# Patient Record
Sex: Female | Born: 1955 | Race: White | Hispanic: No | Marital: Married | Smoking: Former smoker
Health system: Southern US, Community
[De-identification: ages and names within clinical notes are randomized; demographics above are authoritative.]

## PROBLEM LIST (undated history)

## (undated) DIAGNOSIS — M21379 Foot drop, unspecified foot: Secondary | ICD-10-CM

## (undated) DIAGNOSIS — C801 Malignant (primary) neoplasm, unspecified: Secondary | ICD-10-CM

## (undated) DIAGNOSIS — F32A Depression, unspecified: Secondary | ICD-10-CM

## (undated) DIAGNOSIS — C189 Malignant neoplasm of colon, unspecified: Secondary | ICD-10-CM

## (undated) DIAGNOSIS — C787 Secondary malignant neoplasm of liver and intrahepatic bile duct: Secondary | ICD-10-CM

## (undated) DIAGNOSIS — IMO0001 Reserved for inherently not codable concepts without codable children: Secondary | ICD-10-CM

## (undated) DIAGNOSIS — E785 Hyperlipidemia, unspecified: Secondary | ICD-10-CM

## (undated) DIAGNOSIS — Z923 Personal history of irradiation: Secondary | ICD-10-CM

## (undated) DIAGNOSIS — M419 Scoliosis, unspecified: Secondary | ICD-10-CM

## (undated) DIAGNOSIS — I10 Essential (primary) hypertension: Secondary | ICD-10-CM

## (undated) DIAGNOSIS — E039 Hypothyroidism, unspecified: Secondary | ICD-10-CM

## (undated) DIAGNOSIS — IMO0002 Reserved for concepts with insufficient information to code with codable children: Secondary | ICD-10-CM

## (undated) DIAGNOSIS — I499 Cardiac arrhythmia, unspecified: Secondary | ICD-10-CM

## (undated) DIAGNOSIS — F329 Major depressive disorder, single episode, unspecified: Secondary | ICD-10-CM

## (undated) DIAGNOSIS — I341 Nonrheumatic mitral (valve) prolapse: Secondary | ICD-10-CM

## (undated) DIAGNOSIS — Z9221 Personal history of antineoplastic chemotherapy: Secondary | ICD-10-CM

## (undated) HISTORY — DX: Essential (primary) hypertension: I10

## (undated) HISTORY — DX: Malignant neoplasm of colon, unspecified: C18.9

## (undated) HISTORY — PX: CHOLECYSTECTOMY: SHX55

## (undated) HISTORY — DX: Hypothyroidism, unspecified: E03.9

## (undated) HISTORY — PX: BACK SURGERY: SHX140

## (undated) HISTORY — DX: Reserved for inherently not codable concepts without codable children: IMO0001

## (undated) HISTORY — DX: Foot drop, unspecified foot: M21.379

## (undated) HISTORY — DX: Hyperlipidemia, unspecified: E78.5

## (undated) HISTORY — DX: Nonrheumatic mitral (valve) prolapse: I34.1

## (undated) HISTORY — DX: Major depressive disorder, single episode, unspecified: F32.9

## (undated) HISTORY — DX: Scoliosis, unspecified: M41.9

## (undated) HISTORY — DX: Reserved for concepts with insufficient information to code with codable children: IMO0002

## (undated) HISTORY — PX: COLON SURGERY: SHX602

## (undated) HISTORY — DX: Secondary malignant neoplasm of liver and intrahepatic bile duct: C78.7

## (undated) HISTORY — DX: Depression, unspecified: F32.A

## (undated) HISTORY — DX: Cardiac arrhythmia, unspecified: I49.9

## (undated) HISTORY — DX: Personal history of antineoplastic chemotherapy: Z92.21

---
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
## 1999-07-05 ENCOUNTER — Ambulatory Visit (HOSPITAL_COMMUNITY): Admission: RE | Admit: 1999-07-05 | Discharge: 1999-07-05 | Payer: Self-pay | Admitting: Internal Medicine

## 1999-07-05 ENCOUNTER — Encounter: Payer: Self-pay | Admitting: Internal Medicine

## 2000-05-11 ENCOUNTER — Encounter: Payer: Self-pay | Admitting: Gynecology

## 2000-05-11 ENCOUNTER — Encounter: Admission: RE | Admit: 2000-05-11 | Discharge: 2000-05-11 | Payer: Self-pay | Admitting: Gynecology

## 2001-04-25 ENCOUNTER — Encounter: Payer: Self-pay | Admitting: Gynecology

## 2001-04-25 ENCOUNTER — Encounter: Admission: RE | Admit: 2001-04-25 | Discharge: 2001-04-25 | Payer: Self-pay | Admitting: Gynecology

## 2001-06-17 ENCOUNTER — Other Ambulatory Visit: Admission: RE | Admit: 2001-06-17 | Discharge: 2001-06-17 | Payer: Self-pay | Admitting: Gynecology

## 2002-04-04 ENCOUNTER — Encounter: Payer: Self-pay | Admitting: Internal Medicine

## 2002-04-04 ENCOUNTER — Ambulatory Visit (HOSPITAL_COMMUNITY): Admission: RE | Admit: 2002-04-04 | Discharge: 2002-04-04 | Payer: Self-pay | Admitting: Internal Medicine

## 2002-05-21 ENCOUNTER — Encounter: Payer: Self-pay | Admitting: Gynecology

## 2002-05-21 ENCOUNTER — Encounter: Admission: RE | Admit: 2002-05-21 | Discharge: 2002-05-21 | Payer: Self-pay | Admitting: Gynecology

## 2002-08-05 ENCOUNTER — Other Ambulatory Visit: Admission: RE | Admit: 2002-08-05 | Discharge: 2002-08-05 | Payer: Self-pay | Admitting: Gynecology

## 2003-09-09 ENCOUNTER — Other Ambulatory Visit: Admission: RE | Admit: 2003-09-09 | Discharge: 2003-09-09 | Payer: Self-pay | Admitting: Gynecology

## 2003-09-09 ENCOUNTER — Encounter: Admission: RE | Admit: 2003-09-09 | Discharge: 2003-09-09 | Payer: Self-pay | Admitting: Gynecology

## 2003-09-12 HISTORY — PX: KNEE ARTHROSCOPY: SUR90

## 2003-09-22 ENCOUNTER — Emergency Department (HOSPITAL_COMMUNITY): Admission: EM | Admit: 2003-09-22 | Discharge: 2003-09-22 | Payer: Self-pay | Admitting: Emergency Medicine

## 2003-10-05 ENCOUNTER — Encounter: Admission: RE | Admit: 2003-10-05 | Discharge: 2003-10-05 | Payer: Self-pay | Admitting: Specialist

## 2005-02-24 ENCOUNTER — Encounter: Admission: RE | Admit: 2005-02-24 | Discharge: 2005-02-24 | Payer: Self-pay | Admitting: Gynecology

## 2006-07-09 ENCOUNTER — Ambulatory Visit (HOSPITAL_COMMUNITY): Admission: RE | Admit: 2006-07-09 | Discharge: 2006-07-09 | Payer: Self-pay | Admitting: Internal Medicine

## 2006-10-19 ENCOUNTER — Other Ambulatory Visit: Admission: RE | Admit: 2006-10-19 | Discharge: 2006-10-19 | Payer: Self-pay | Admitting: Gynecology

## 2006-12-14 ENCOUNTER — Encounter: Admission: RE | Admit: 2006-12-14 | Discharge: 2006-12-14 | Payer: Self-pay | Admitting: Gynecology

## 2007-01-04 ENCOUNTER — Ambulatory Visit: Payer: Self-pay | Admitting: Gastroenterology

## 2007-01-17 ENCOUNTER — Encounter: Payer: Self-pay | Admitting: Gastroenterology

## 2007-01-17 ENCOUNTER — Ambulatory Visit (HOSPITAL_COMMUNITY): Admission: RE | Admit: 2007-01-17 | Discharge: 2007-01-17 | Payer: Self-pay | Admitting: Gastroenterology

## 2007-01-23 ENCOUNTER — Ambulatory Visit: Payer: Self-pay | Admitting: Gastroenterology

## 2007-03-13 ENCOUNTER — Encounter (INDEPENDENT_AMBULATORY_CARE_PROVIDER_SITE_OTHER): Payer: Self-pay | Admitting: Surgery

## 2007-03-13 ENCOUNTER — Ambulatory Visit (HOSPITAL_COMMUNITY): Admission: RE | Admit: 2007-03-13 | Discharge: 2007-03-13 | Payer: Self-pay | Admitting: Surgery

## 2007-03-13 ENCOUNTER — Encounter (INDEPENDENT_AMBULATORY_CARE_PROVIDER_SITE_OTHER): Payer: Self-pay | Admitting: *Deleted

## 2007-04-11 ENCOUNTER — Ambulatory Visit: Payer: Self-pay | Admitting: Gastroenterology

## 2007-04-11 LAB — CONVERTED CEMR LAB
Alkaline Phosphatase: 63 units/L (ref 39–117)
Bilirubin, Direct: 0.1 mg/dL (ref 0.0–0.3)
Total Bilirubin: 0.5 mg/dL (ref 0.3–1.2)

## 2007-10-18 ENCOUNTER — Ambulatory Visit: Payer: Self-pay | Admitting: Gastroenterology

## 2008-01-17 ENCOUNTER — Encounter: Admission: RE | Admit: 2008-01-17 | Discharge: 2008-01-17 | Payer: Self-pay | Admitting: Gynecology

## 2008-09-16 ENCOUNTER — Ambulatory Visit: Payer: Self-pay | Admitting: Gastroenterology

## 2008-10-26 ENCOUNTER — Ambulatory Visit: Payer: Self-pay | Admitting: Gastroenterology

## 2008-11-03 ENCOUNTER — Telehealth (INDEPENDENT_AMBULATORY_CARE_PROVIDER_SITE_OTHER): Payer: Self-pay | Admitting: *Deleted

## 2008-11-03 ENCOUNTER — Ambulatory Visit: Payer: Self-pay | Admitting: Gastroenterology

## 2008-11-13 ENCOUNTER — Encounter: Payer: Self-pay | Admitting: Gastroenterology

## 2009-04-01 ENCOUNTER — Telehealth: Payer: Self-pay | Admitting: Gastroenterology

## 2009-04-06 ENCOUNTER — Ambulatory Visit: Payer: Self-pay | Admitting: Gastroenterology

## 2009-04-14 ENCOUNTER — Telehealth: Payer: Self-pay | Admitting: Gastroenterology

## 2009-04-20 ENCOUNTER — Telehealth: Payer: Self-pay | Admitting: Gastroenterology

## 2009-04-20 ENCOUNTER — Telehealth (INDEPENDENT_AMBULATORY_CARE_PROVIDER_SITE_OTHER): Payer: Self-pay | Admitting: *Deleted

## 2009-04-21 ENCOUNTER — Ambulatory Visit: Payer: Self-pay | Admitting: Gastroenterology

## 2009-04-21 ENCOUNTER — Observation Stay (HOSPITAL_COMMUNITY): Admission: RE | Admit: 2009-04-21 | Discharge: 2009-04-23 | Payer: Self-pay | Admitting: Gastroenterology

## 2009-04-22 ENCOUNTER — Encounter: Payer: Self-pay | Admitting: Gastroenterology

## 2009-04-28 ENCOUNTER — Encounter: Payer: Self-pay | Admitting: Gastroenterology

## 2009-05-03 ENCOUNTER — Inpatient Hospital Stay (HOSPITAL_COMMUNITY): Admission: RE | Admit: 2009-05-03 | Discharge: 2009-05-06 | Payer: Self-pay | Admitting: Surgery

## 2009-05-03 ENCOUNTER — Encounter (INDEPENDENT_AMBULATORY_CARE_PROVIDER_SITE_OTHER): Payer: Self-pay | Admitting: Surgery

## 2009-05-13 ENCOUNTER — Ambulatory Visit: Payer: Self-pay | Admitting: Oncology

## 2009-05-20 ENCOUNTER — Encounter: Payer: Self-pay | Admitting: Gastroenterology

## 2009-05-24 ENCOUNTER — Encounter: Payer: Self-pay | Admitting: Gastroenterology

## 2009-06-02 ENCOUNTER — Encounter: Admission: RE | Admit: 2009-06-02 | Discharge: 2009-06-02 | Payer: Self-pay | Admitting: Gynecology

## 2009-06-04 ENCOUNTER — Ambulatory Visit (HOSPITAL_COMMUNITY): Admission: RE | Admit: 2009-06-04 | Discharge: 2009-06-04 | Payer: Self-pay | Admitting: Surgery

## 2009-06-08 LAB — CBC WITH DIFFERENTIAL/PLATELET
Basophils Absolute: 0 10*3/uL (ref 0.0–0.1)
Eosinophils Absolute: 0.2 10*3/uL (ref 0.0–0.5)
HCT: 32.6 % — ABNORMAL LOW (ref 34.8–46.6)
HGB: 10.8 g/dL — ABNORMAL LOW (ref 11.6–15.9)
MONO#: 0.5 10*3/uL (ref 0.1–0.9)
NEUT#: 4.5 10*3/uL (ref 1.5–6.5)
NEUT%: 61.8 % (ref 38.4–76.8)
RDW: 16.4 % — ABNORMAL HIGH (ref 11.2–14.5)
lymph#: 2 10*3/uL (ref 0.9–3.3)

## 2009-06-09 LAB — COMPREHENSIVE METABOLIC PANEL
Albumin: 3.4 g/dL — ABNORMAL LOW (ref 3.5–5.2)
Alkaline Phosphatase: 51 U/L (ref 39–117)
CO2: 32 mEq/L (ref 19–32)
Calcium: 10 mg/dL (ref 8.4–10.5)
Chloride: 104 mEq/L (ref 96–112)
Glucose, Bld: 117 mg/dL — ABNORMAL HIGH (ref 70–99)
Potassium: 3.7 mEq/L (ref 3.5–5.3)
Sodium: 141 mEq/L (ref 135–145)
Total Protein: 7.1 g/dL (ref 6.0–8.3)

## 2009-06-18 ENCOUNTER — Ambulatory Visit: Payer: Self-pay | Admitting: Oncology

## 2009-06-22 LAB — CBC WITH DIFFERENTIAL/PLATELET
Basophils Absolute: 0 10*3/uL (ref 0.0–0.1)
Eosinophils Absolute: 0.2 10*3/uL (ref 0.0–0.5)
HGB: 10.9 g/dL — ABNORMAL LOW (ref 11.6–15.9)
LYMPH%: 30.6 % (ref 14.0–49.7)
MONO#: 0.6 10*3/uL (ref 0.1–0.9)
NEUT#: 3.3 10*3/uL (ref 1.5–6.5)
Platelets: 254 10*3/uL (ref 145–400)
RBC: 3.68 10*6/uL — ABNORMAL LOW (ref 3.70–5.45)
RDW: 15.4 % — ABNORMAL HIGH (ref 11.2–14.5)
WBC: 5.9 10*3/uL (ref 3.9–10.3)

## 2009-06-22 LAB — COMPREHENSIVE METABOLIC PANEL
Albumin: 3.4 g/dL — ABNORMAL LOW (ref 3.5–5.2)
CO2: 28 mEq/L (ref 19–32)
Glucose, Bld: 113 mg/dL — ABNORMAL HIGH (ref 70–99)
Potassium: 4.4 mEq/L (ref 3.5–5.3)
Sodium: 138 mEq/L (ref 135–145)
Total Protein: 6.5 g/dL (ref 6.0–8.3)

## 2009-06-29 LAB — BASIC METABOLIC PANEL
Calcium: 9.4 mg/dL (ref 8.4–10.5)
Potassium: 4.1 mEq/L (ref 3.5–5.3)
Sodium: 136 mEq/L (ref 135–145)

## 2009-06-29 LAB — CBC WITH DIFFERENTIAL/PLATELET
Eosinophils Absolute: 0.1 10*3/uL (ref 0.0–0.5)
MONO#: 0.8 10*3/uL (ref 0.1–0.9)
MONO%: 16.2 % — ABNORMAL HIGH (ref 0.0–14.0)
NEUT#: 2.1 10*3/uL (ref 1.5–6.5)
RBC: 4.06 10*6/uL (ref 3.70–5.45)
RDW: 16.5 % — ABNORMAL HIGH (ref 11.2–14.5)
WBC: 4.9 10*3/uL (ref 3.9–10.3)
lymph#: 2 10*3/uL (ref 0.9–3.3)

## 2009-06-30 LAB — RESEARCH LABS

## 2009-07-13 LAB — CBC WITH DIFFERENTIAL/PLATELET
BASO%: 0.3 % (ref 0.0–2.0)
Eosinophils Absolute: 0.2 10*3/uL (ref 0.0–0.5)
HCT: 36.9 % (ref 34.8–46.6)
HGB: 12.3 g/dL (ref 11.6–15.9)
MCHC: 33.3 g/dL (ref 31.5–36.0)
MONO#: 0.8 10*3/uL (ref 0.1–0.9)
NEUT#: 3.8 10*3/uL (ref 1.5–6.5)
NEUT%: 60.2 % (ref 38.4–76.8)
WBC: 6.3 10*3/uL (ref 3.9–10.3)
lymph#: 1.5 10*3/uL (ref 0.9–3.3)

## 2009-07-13 LAB — COMPREHENSIVE METABOLIC PANEL
ALT: 34 U/L (ref 0–35)
CO2: 27 mEq/L (ref 19–32)
Calcium: 8.9 mg/dL (ref 8.4–10.5)
Chloride: 107 mEq/L (ref 96–112)
Creatinine, Ser: 0.42 mg/dL (ref 0.40–1.20)
Glucose, Bld: 103 mg/dL — ABNORMAL HIGH (ref 70–99)
Sodium: 138 mEq/L (ref 135–145)
Total Protein: 6.9 g/dL (ref 6.0–8.3)

## 2009-07-14 LAB — RESEARCH LABS

## 2009-07-21 ENCOUNTER — Encounter: Payer: Self-pay | Admitting: Gastroenterology

## 2009-07-23 ENCOUNTER — Ambulatory Visit: Payer: Self-pay | Admitting: Oncology

## 2009-07-27 LAB — COMPREHENSIVE METABOLIC PANEL
ALT: 31 U/L (ref 0–35)
AST: 42 U/L — ABNORMAL HIGH (ref 0–37)
Albumin: 3.1 g/dL — ABNORMAL LOW (ref 3.5–5.2)
Calcium: 9.2 mg/dL (ref 8.4–10.5)
Chloride: 104 mEq/L (ref 96–112)
Potassium: 3.8 mEq/L (ref 3.5–5.3)
Sodium: 136 mEq/L (ref 135–145)

## 2009-07-27 LAB — CBC WITH DIFFERENTIAL/PLATELET
BASO%: 0.3 % (ref 0.0–2.0)
EOS%: 1.6 % (ref 0.0–7.0)
MCH: 30 pg (ref 25.1–34.0)
MCHC: 33.7 g/dL (ref 31.5–36.0)
RDW: 16.1 % — ABNORMAL HIGH (ref 11.2–14.5)
lymph#: 1.1 10*3/uL (ref 0.9–3.3)

## 2009-08-10 LAB — COMPREHENSIVE METABOLIC PANEL
BUN: 9 mg/dL (ref 6–23)
CO2: 25 mEq/L (ref 19–32)
Glucose, Bld: 112 mg/dL — ABNORMAL HIGH (ref 70–99)
Sodium: 135 mEq/L (ref 135–145)
Total Bilirubin: 0.4 mg/dL (ref 0.3–1.2)
Total Protein: 6.7 g/dL (ref 6.0–8.3)

## 2009-08-10 LAB — CBC WITH DIFFERENTIAL/PLATELET
Basophils Absolute: 0 10*3/uL (ref 0.0–0.1)
Eosinophils Absolute: 0.1 10*3/uL (ref 0.0–0.5)
HCT: 36 % (ref 34.8–46.6)
HGB: 12.2 g/dL (ref 11.6–15.9)
LYMPH%: 26.3 % (ref 14.0–49.7)
MCV: 88.4 fL (ref 79.5–101.0)
MONO#: 0.8 10*3/uL (ref 0.1–0.9)
MONO%: 13.4 % (ref 0.0–14.0)
NEUT#: 3.3 10*3/uL (ref 1.5–6.5)
NEUT%: 58.1 % (ref 38.4–76.8)
Platelets: 86 10*3/uL — ABNORMAL LOW (ref 145–400)
RBC: 4.07 10*6/uL (ref 3.70–5.45)
WBC: 5.7 10*3/uL (ref 3.9–10.3)

## 2009-08-24 ENCOUNTER — Ambulatory Visit: Payer: Self-pay | Admitting: Oncology

## 2009-08-24 LAB — CBC WITH DIFFERENTIAL/PLATELET
Basophils Absolute: 0 10*3/uL (ref 0.0–0.1)
Eosinophils Absolute: 0.1 10*3/uL (ref 0.0–0.5)
HGB: 12.2 g/dL (ref 11.6–15.9)
MCV: 90.4 fL (ref 79.5–101.0)
MONO%: 15.3 % — ABNORMAL HIGH (ref 0.0–14.0)
NEUT#: 2.2 10*3/uL (ref 1.5–6.5)
Platelets: 153 10*3/uL (ref 145–400)
RDW: 17.8 % — ABNORMAL HIGH (ref 11.2–14.5)

## 2009-08-24 LAB — COMPREHENSIVE METABOLIC PANEL
Albumin: 3.1 g/dL — ABNORMAL LOW (ref 3.5–5.2)
Alkaline Phosphatase: 70 U/L (ref 39–117)
BUN: 10 mg/dL (ref 6–23)
Calcium: 9 mg/dL (ref 8.4–10.5)
Glucose, Bld: 128 mg/dL — ABNORMAL HIGH (ref 70–99)
Potassium: 3.7 mEq/L (ref 3.5–5.3)

## 2009-09-07 LAB — CBC WITH DIFFERENTIAL/PLATELET
Basophils Absolute: 0 10*3/uL (ref 0.0–0.1)
Eosinophils Absolute: 0 10*3/uL (ref 0.0–0.5)
HGB: 12 g/dL (ref 11.6–15.9)
MCV: 92.5 fL (ref 79.5–101.0)
MONO#: 0.7 10*3/uL (ref 0.1–0.9)
NEUT#: 2.7 10*3/uL (ref 1.5–6.5)
RDW: 19.3 % — ABNORMAL HIGH (ref 11.2–14.5)
WBC: 4.5 10*3/uL (ref 3.9–10.3)
lymph#: 1 10*3/uL (ref 0.9–3.3)

## 2009-09-07 LAB — COMPREHENSIVE METABOLIC PANEL
Albumin: 3.2 g/dL — ABNORMAL LOW (ref 3.5–5.2)
BUN: 10 mg/dL (ref 6–23)
Calcium: 9 mg/dL (ref 8.4–10.5)
Chloride: 107 mEq/L (ref 96–112)
Glucose, Bld: 107 mg/dL — ABNORMAL HIGH (ref 70–99)
Potassium: 3.1 mEq/L — ABNORMAL LOW (ref 3.5–5.3)
Total Protein: 7.1 g/dL (ref 6.0–8.3)

## 2009-09-17 ENCOUNTER — Ambulatory Visit: Payer: Self-pay | Admitting: Oncology

## 2009-09-21 LAB — COMPREHENSIVE METABOLIC PANEL
ALT: 27 U/L (ref 0–35)
Albumin: 3.3 g/dL — ABNORMAL LOW (ref 3.5–5.2)
CO2: 25 mEq/L (ref 19–32)
Calcium: 9.3 mg/dL (ref 8.4–10.5)
Chloride: 107 mEq/L (ref 96–112)
Creatinine, Ser: 0.41 mg/dL (ref 0.40–1.20)
Sodium: 139 mEq/L (ref 135–145)
Total Bilirubin: 0.8 mg/dL (ref 0.3–1.2)
Total Protein: 7.4 g/dL (ref 6.0–8.3)

## 2009-09-21 LAB — CBC WITH DIFFERENTIAL/PLATELET
Basophils Absolute: 0 10*3/uL (ref 0.0–0.1)
EOS%: 1 % (ref 0.0–7.0)
Eosinophils Absolute: 0 10*3/uL (ref 0.0–0.5)
HCT: 36.7 % (ref 34.8–46.6)
LYMPH%: 28.7 % (ref 14.0–49.7)
MCHC: 33.3 g/dL (ref 31.5–36.0)
MONO#: 0.6 10*3/uL (ref 0.1–0.9)
MONO%: 16.4 % — ABNORMAL HIGH (ref 0.0–14.0)
NEUT%: 53.6 % (ref 38.4–76.8)
Platelets: 156 10*3/uL (ref 145–400)
RBC: 3.9 10*6/uL (ref 3.70–5.45)

## 2009-09-28 LAB — COMPREHENSIVE METABOLIC PANEL
AST: 44 U/L — ABNORMAL HIGH (ref 0–37)
CO2: 27 mEq/L (ref 19–32)
Glucose, Bld: 106 mg/dL — ABNORMAL HIGH (ref 70–99)
Potassium: 3.1 mEq/L — ABNORMAL LOW (ref 3.5–5.3)
Total Protein: 6.5 g/dL (ref 6.0–8.3)

## 2009-09-28 LAB — CBC WITH DIFFERENTIAL/PLATELET
BASO%: 0.4 % (ref 0.0–2.0)
EOS%: 1.1 % (ref 0.0–7.0)
LYMPH%: 41.4 % (ref 14.0–49.7)
MCH: 30.7 pg (ref 25.1–34.0)
MONO%: 20.4 % — ABNORMAL HIGH (ref 0.0–14.0)
NEUT%: 36.7 % — ABNORMAL LOW (ref 38.4–76.8)
Platelets: 165 10*3/uL (ref 145–400)
RDW: 19.3 % — ABNORMAL HIGH (ref 11.2–14.5)
nRBC: 0 % (ref 0–0)

## 2009-09-30 LAB — BASIC METABOLIC PANEL
Chloride: 103 mEq/L (ref 96–112)
Creatinine, Ser: 0.45 mg/dL (ref 0.40–1.20)
Glucose, Bld: 139 mg/dL — ABNORMAL HIGH (ref 70–99)
Potassium: 4.4 mEq/L (ref 3.5–5.3)
Sodium: 137 mEq/L (ref 135–145)

## 2009-10-12 LAB — CBC WITH DIFFERENTIAL/PLATELET
BASO%: 0.4 % (ref 0.0–2.0)
EOS%: 0.8 % (ref 0.0–7.0)
HCT: 35.7 % (ref 34.8–46.6)
MCH: 33.4 pg (ref 25.1–34.0)
MCHC: 34.1 g/dL (ref 31.5–36.0)
MONO#: 0.6 10*3/uL (ref 0.1–0.9)
NEUT%: 69.6 % (ref 38.4–76.8)
RDW: 19.4 % — ABNORMAL HIGH (ref 11.2–14.5)
WBC: 5.4 10*3/uL (ref 3.9–10.3)
lymph#: 1 10*3/uL (ref 0.9–3.3)

## 2009-10-12 LAB — COMPREHENSIVE METABOLIC PANEL
ALT: 25 U/L (ref 0–35)
Albumin: 3.3 g/dL — ABNORMAL LOW (ref 3.5–5.2)
BUN: 11 mg/dL (ref 6–23)
CO2: 27 mEq/L (ref 19–32)
Calcium: 9.3 mg/dL (ref 8.4–10.5)
Chloride: 104 mEq/L (ref 96–112)
Creatinine, Ser: 0.54 mg/dL (ref 0.40–1.20)
Potassium: 3.7 mEq/L (ref 3.5–5.3)

## 2009-10-21 ENCOUNTER — Ambulatory Visit: Payer: Self-pay | Admitting: Oncology

## 2009-10-25 LAB — COMPREHENSIVE METABOLIC PANEL
AST: 49 U/L — ABNORMAL HIGH (ref 0–37)
Albumin: 3.4 g/dL — ABNORMAL LOW (ref 3.5–5.2)
BUN: 12 mg/dL (ref 6–23)
CO2: 26 mEq/L (ref 19–32)
Calcium: 9.4 mg/dL (ref 8.4–10.5)
Chloride: 106 mEq/L (ref 96–112)
Creatinine, Ser: 0.55 mg/dL (ref 0.40–1.20)
Potassium: 4.2 mEq/L (ref 3.5–5.3)

## 2009-10-25 LAB — CBC WITH DIFFERENTIAL/PLATELET
Basophils Absolute: 0 10*3/uL (ref 0.0–0.1)
Eosinophils Absolute: 0.1 10*3/uL (ref 0.0–0.5)
HCT: 36.1 % (ref 34.8–46.6)
HGB: 12.1 g/dL (ref 11.6–15.9)
MONO#: 0.6 10*3/uL (ref 0.1–0.9)
NEUT#: 2.3 10*3/uL (ref 1.5–6.5)
NEUT%: 57.1 % (ref 38.4–76.8)
RDW: 18.7 % — ABNORMAL HIGH (ref 11.2–14.5)
WBC: 4.1 10*3/uL (ref 3.9–10.3)
lymph#: 1.1 10*3/uL (ref 0.9–3.3)

## 2009-11-09 LAB — CBC WITH DIFFERENTIAL/PLATELET
Basophils Absolute: 0 10*3/uL (ref 0.0–0.1)
EOS%: 1.2 % (ref 0.0–7.0)
HCT: 33.9 % — ABNORMAL LOW (ref 34.8–46.6)
HGB: 11.6 g/dL (ref 11.6–15.9)
LYMPH%: 30.2 % (ref 14.0–49.7)
MCV: 98.7 fL (ref 79.5–101.0)
MONO%: 19.3 % — ABNORMAL HIGH (ref 0.0–14.0)
NEUT#: 1.8 10*3/uL (ref 1.5–6.5)
Platelets: 106 10*3/uL — ABNORMAL LOW (ref 145–400)
WBC: 3.6 10*3/uL — ABNORMAL LOW (ref 3.9–10.3)
lymph#: 1.1 10*3/uL (ref 0.9–3.3)

## 2009-11-09 LAB — COMPREHENSIVE METABOLIC PANEL
ALT: 32 U/L (ref 0–35)
Chloride: 110 mEq/L (ref 96–112)
Creatinine, Ser: 0.5 mg/dL (ref 0.40–1.20)
Total Bilirubin: 0.6 mg/dL (ref 0.3–1.2)
Total Protein: 7.2 g/dL (ref 6.0–8.3)

## 2009-11-22 ENCOUNTER — Ambulatory Visit: Payer: Self-pay | Admitting: Oncology

## 2009-11-22 LAB — CBC WITH DIFFERENTIAL/PLATELET
Eosinophils Absolute: 0 10*3/uL (ref 0.0–0.5)
HCT: 35.9 % (ref 34.8–46.6)
LYMPH%: 27.1 % (ref 14.0–49.7)
MONO#: 0.6 10*3/uL (ref 0.1–0.9)
NEUT#: 2.4 10*3/uL (ref 1.5–6.5)
NEUT%: 56.7 % (ref 38.4–76.8)
Platelets: 98 10*3/uL — ABNORMAL LOW (ref 145–400)
WBC: 4.3 10*3/uL (ref 3.9–10.3)
lymph#: 1.2 10*3/uL (ref 0.9–3.3)
nRBC: 0 % (ref 0–0)

## 2009-11-22 LAB — COMPREHENSIVE METABOLIC PANEL
AST: 59 U/L — ABNORMAL HIGH (ref 0–37)
Albumin: 3.3 g/dL — ABNORMAL LOW (ref 3.5–5.2)
Calcium: 9.2 mg/dL (ref 8.4–10.5)
Creatinine, Ser: 0.42 mg/dL (ref 0.40–1.20)
Glucose, Bld: 107 mg/dL — ABNORMAL HIGH (ref 70–99)
Potassium: 4.4 mEq/L (ref 3.5–5.3)
Sodium: 139 mEq/L (ref 135–145)
Total Bilirubin: 0.6 mg/dL (ref 0.3–1.2)
Total Protein: 6.8 g/dL (ref 6.0–8.3)

## 2010-01-03 ENCOUNTER — Ambulatory Visit: Payer: Self-pay | Admitting: Oncology

## 2010-01-04 ENCOUNTER — Encounter: Payer: Self-pay | Admitting: Gastroenterology

## 2010-01-04 LAB — CBC WITH DIFFERENTIAL/PLATELET
Basophils Absolute: 0 10*3/uL (ref 0.0–0.1)
EOS%: 1.6 % (ref 0.0–7.0)
HCT: 34.2 % — ABNORMAL LOW (ref 34.8–46.6)
LYMPH%: 28.8 % (ref 14.0–49.7)
MCH: 34.3 pg — ABNORMAL HIGH (ref 25.1–34.0)
MCHC: 34.6 g/dL (ref 31.5–36.0)
MONO#: 0.4 10*3/uL (ref 0.1–0.9)
NEUT#: 2.8 10*3/uL (ref 1.5–6.5)
WBC: 4.6 10*3/uL (ref 3.9–10.3)

## 2010-01-04 LAB — CEA: CEA: 1.1 ng/mL (ref 0.0–5.0)

## 2010-01-04 LAB — COMPREHENSIVE METABOLIC PANEL
ALT: 18 U/L (ref 0–35)
AST: 30 U/L (ref 0–37)
Chloride: 105 mEq/L (ref 96–112)
Potassium: 3.7 mEq/L (ref 3.5–5.3)
Sodium: 137 mEq/L (ref 135–145)
Total Bilirubin: 0.4 mg/dL (ref 0.3–1.2)
Total Protein: 7.1 g/dL (ref 6.0–8.3)

## 2010-01-13 ENCOUNTER — Ambulatory Visit (HOSPITAL_BASED_OUTPATIENT_CLINIC_OR_DEPARTMENT_OTHER): Admission: RE | Admit: 2010-01-13 | Discharge: 2010-01-13 | Payer: Self-pay | Admitting: Surgery

## 2010-03-28 ENCOUNTER — Telehealth: Payer: Self-pay | Admitting: Gastroenterology

## 2010-04-28 ENCOUNTER — Ambulatory Visit (HOSPITAL_COMMUNITY): Admission: RE | Admit: 2010-04-28 | Discharge: 2010-04-28 | Payer: Self-pay | Admitting: Oncology

## 2010-04-28 ENCOUNTER — Ambulatory Visit: Payer: Self-pay | Admitting: Oncology

## 2010-04-28 LAB — CBC WITH DIFFERENTIAL/PLATELET
BASO%: 0.2 % (ref 0.0–2.0)
Basophils Absolute: 0 10*3/uL (ref 0.0–0.1)
HCT: 36.2 % (ref 34.8–46.6)
HGB: 12.3 g/dL (ref 11.6–15.9)
LYMPH%: 29.8 % (ref 14.0–49.7)
MCH: 31.1 pg (ref 25.1–34.0)
MONO#: 0.5 10*3/uL (ref 0.1–0.9)
NEUT#: 3.7 10*3/uL (ref 1.5–6.5)
NEUT%: 60.5 % (ref 38.4–76.8)
RBC: 3.94 10*6/uL (ref 3.70–5.45)
RDW: 13.5 % (ref 11.2–14.5)

## 2010-04-28 LAB — COMPREHENSIVE METABOLIC PANEL
ALT: 28 U/L (ref 0–35)
CO2: 29 mEq/L (ref 19–32)
Calcium: 10 mg/dL (ref 8.4–10.5)
Potassium: 4.2 mEq/L (ref 3.5–5.3)
Sodium: 140 mEq/L (ref 135–145)
Total Bilirubin: 0.5 mg/dL (ref 0.3–1.2)
Total Protein: 8.1 g/dL (ref 6.0–8.3)

## 2010-04-28 LAB — CEA: CEA: 0.9 ng/mL (ref 0.0–5.0)

## 2010-05-03 ENCOUNTER — Telehealth (INDEPENDENT_AMBULATORY_CARE_PROVIDER_SITE_OTHER): Payer: Self-pay | Admitting: *Deleted

## 2010-05-04 ENCOUNTER — Observation Stay (HOSPITAL_COMMUNITY): Admission: AD | Admit: 2010-05-04 | Discharge: 2010-05-05 | Payer: Self-pay | Admitting: Gastroenterology

## 2010-05-04 ENCOUNTER — Ambulatory Visit: Payer: Self-pay | Admitting: Gastroenterology

## 2010-05-05 ENCOUNTER — Encounter: Payer: Self-pay | Admitting: Gastroenterology

## 2010-05-05 HISTORY — PX: COLONOSCOPY: SHX174

## 2010-05-06 DIAGNOSIS — C189 Malignant neoplasm of colon, unspecified: Secondary | ICD-10-CM

## 2010-05-06 HISTORY — DX: Malignant neoplasm of colon, unspecified: C18.9

## 2010-06-08 ENCOUNTER — Encounter: Admission: RE | Admit: 2010-06-08 | Discharge: 2010-06-08 | Payer: Self-pay | Admitting: Gynecology

## 2010-10-01 ENCOUNTER — Encounter: Payer: Self-pay | Admitting: Gynecology

## 2010-10-11 NOTE — Letter (Signed)
[facility]   Imported By: Lennie Odor 03/28/2010 14:35:25  _____________________________________________________________________  External Attachment:    Type:   Image     Comment:   External Document

## 2010-10-11 NOTE — Progress Notes (Signed)
Summary: ? Colonoscopy  Phone Note Call from Patient Call back at Home Phone [phone]   Caller: Patient Call For: Christella Hartigan Summary of Call: Wheelchair bound pt she states that Dr.Nazanin Kinner usually has her colonoscopies done as an INPT, she is wondering about having her next colonoscopy done.  Initial call taken by: Harlow Mares CMA Duncan Dull),  March 28, 2010 11:57 AM  Follow-up for Phone Call        pt informed she has a recall in the system for august  Dr Christella Hartigan will review and we will call her to set that up.  She wanted to remind Dr Christella Hartigan that she needs to be adnitted the night before. Follow-up by: Chales Abrahams CMA Duncan Dull),  March 28, 2010 12:43 PM

## 2010-10-11 NOTE — Procedures (Signed)
Summary: Colonoscopy  Patient: [name] Note: All result statuses are Final unless otherwise noted.  Tests: (1) Colonoscopy (COL)   COL Colonoscopy           DONE     [facility]     [address] [place], [place]  [address]           COLONOSCOPY PROCEDURE REPORT           PATIENT:  April, Spence  MR#:  [id number]     BIRTHDATE:  27-May-1956, 54 yrs. old  GENDER:  female     ENDOSCOPIST:  Rachael Fee, MD     PROCEDURE DATE:  05/05/2010     PROCEDURE:  Colonoscopy with biopsy     ASA CLASS:  Class II     INDICATIONS:  T3N1 cecal cancer; resected 2010, + adjuvant chemo     MEDICATIONS:   Fentanyl 100 mcg IV, Versed 8 mg IV           DESCRIPTION OF PROCEDURE:   After the risks benefits and     alternatives of the procedure were thoroughly explained, informed     consent was obtained.  Digital rectal exam was performed and     revealed no rectal masses.   The  endoscope was introduced through     the anus and advanced to the cecum, which was identified by both     the appendix and ileocecal valve, without limitations.  The     quality of the prep was excellent, using MoviPrep.  The instrument     was then slowly withdrawn as the colon was fully examined.     <<PROCEDUREIMAGES>>     FINDINGS:  At the right hemicolectomy anastomosis line, there was     slight nodularity. This did not appear neoplastic (probably suture     related) but was biopsied to be certain (see image4, image3,     image1, and image2).  This was otherwise a normal examination of     the colon (see image5).   Retroflexed views in the rectum revealed     no abnormalities.    The scope was then withdrawn from the patient     and the procedure completed.     COMPLICATIONS:  None           ENDOSCOPIC IMPRESSION:     1) Previous right colectomy anastomosis line was slightly     nodular; likely suture related however biopsies taken to be     certain     2) Otherwise normal examination       RECOMMENDATIONS:     Await biopsy results. Likely will need repeat colonoscopy in 3     years given 2010 colon cancer treatment.           ______________________________     Rachael Fee, MD           cc: Drs. Jessica Priest           n.     eSIGNED:   Rachael Fee at 05/05/2010 08:54 AM           Hildred Alamin, [id number]  Note: An exclamation mark (!) indicates a result that was not dispersed into the flowsheet. Document Creation Date: 05/05/2010 8:56 AM _______________________________________________________________________  (1) Order result status: Final Collection or observation date-time: 05/05/2010 08:49 Requested date-time:  Receipt date-time:  Reported date-time:  Referring Physician:   Ordering Physician:  Rob Bunting [id number]) Specimen Source:  Source: Launa Grill Order Number: [id number] Lab site:

## 2010-10-11 NOTE — Progress Notes (Signed)
Summary: In patient Colon  Phone Note Outgoing Call   Call placed by: Chales Abrahams CMA Duncan Dull),  May 03, 2010 9:09 AM Summary of Call: called Bed control and requested a bed for pt to arrive am of 05/04/10.  they will call the pt when bed available.  pt aware Initial call taken by: Chales Abrahams CMA Duncan Dull),  May 03, 2010 9:10 AM

## 2010-11-09 ENCOUNTER — Other Ambulatory Visit: Payer: Self-pay | Admitting: Oncology

## 2010-11-09 ENCOUNTER — Encounter (HOSPITAL_BASED_OUTPATIENT_CLINIC_OR_DEPARTMENT_OTHER): Payer: Medicare HMO | Admitting: Oncology

## 2010-11-09 DIAGNOSIS — C180 Malignant neoplasm of cecum: Secondary | ICD-10-CM

## 2010-11-09 DIAGNOSIS — C189 Malignant neoplasm of colon, unspecified: Secondary | ICD-10-CM

## 2010-11-09 DIAGNOSIS — E876 Hypokalemia: Secondary | ICD-10-CM

## 2010-11-09 LAB — CEA: CEA: 3.3 ng/mL (ref 0.0–5.0)

## 2010-12-16 LAB — COMPREHENSIVE METABOLIC PANEL
AST: 33 U/L (ref 0–37)
Albumin: 3.5 g/dL (ref 3.5–5.2)
Calcium: 9.4 mg/dL (ref 8.4–10.5)
Creatinine, Ser: 0.46 mg/dL (ref 0.4–1.2)
GFR calc Af Amer: >60 mL/min (ref >60)
Total Protein: 7.1 g/dL (ref 6.0–8.3)

## 2010-12-16 LAB — DIFFERENTIAL
Eosinophils Relative: 3 % (ref 0–5)
Lymphocytes Relative: 30 % (ref 12–46)
Lymphs Abs: 2.4 10*3/uL (ref 0.7–4.0)
Monocytes Absolute: 0.6 10*3/uL (ref 0.1–1.0)

## 2010-12-16 LAB — CBC
Hemoglobin: 11.2 g/dL — ABNORMAL LOW (ref 12.0–15.0)
MCHC: 32.9 g/dL (ref 30.0–36.0)
RBC: 3.84 MIL/uL — ABNORMAL LOW (ref 3.87–5.11)
RDW: 16.8 % — ABNORMAL HIGH (ref 11.5–15.5)

## 2010-12-16 LAB — TSH: TSH: 0.961 u[IU]/mL (ref 0.350–4.500)

## 2010-12-16 LAB — VITAMIN D 1,25 DIHYDROXY: Vitamin D2 1, 25 (OH)2: <8 pg/mL

## 2010-12-16 LAB — LIPID PANEL
HDL: 43 mg/dL (ref >39)
Total CHOL/HDL Ratio: 4.2 RATIO
Triglycerides: 201 mg/dL — ABNORMAL HIGH (ref <150)

## 2010-12-17 LAB — HEMOGLOBIN: Hemoglobin: 7.5 g/dL — CL (ref 12.0–15.0)

## 2010-12-17 LAB — TYPE AND SCREEN
ABO/RH(D): A NEG
Antibody Screen: NEGATIVE

## 2010-12-17 LAB — CBC
HCT: 22.9 % — ABNORMAL LOW (ref 36.0–46.0)
HCT: 26 % — ABNORMAL LOW (ref 36.0–46.0)
Hemoglobin: 8.5 g/dL — ABNORMAL LOW (ref 12.0–15.0)
MCHC: 32.9 g/dL (ref 30.0–36.0)
MCV: 87.7 fL (ref 78.0–100.0)
Platelets: 437 10*3/uL — ABNORMAL HIGH (ref 150–400)
Platelets: 546 10*3/uL — ABNORMAL HIGH (ref 150–400)
RDW: 17 % — ABNORMAL HIGH (ref 11.5–15.5)
RDW: 15.3 % (ref 11.5–15.5)
WBC: 9.4 10*3/uL (ref 4.0–10.5)

## 2010-12-17 LAB — BASIC METABOLIC PANEL
BUN: 5 mg/dL — ABNORMAL LOW (ref 6–23)
CO2: 29 mEq/L (ref 19–32)
Chloride: 103 mEq/L (ref 96–112)
Glucose, Bld: 121 mg/dL — ABNORMAL HIGH (ref 70–99)
Potassium: 3.4 mEq/L — ABNORMAL LOW (ref 3.5–5.1)
Sodium: 140 mEq/L (ref 135–145)

## 2010-12-17 LAB — CREATININE, SERUM
Creatinine, Ser: 0.56 mg/dL (ref 0.4–1.2)
GFR calc non Af Amer: >60 mL/min (ref >60)

## 2010-12-17 LAB — HEMOGLOBIN AND HEMATOCRIT, BLOOD: Hemoglobin: 8.2 g/dL — ABNORMAL LOW (ref 12.0–15.0)

## 2010-12-17 LAB — POTASSIUM: Potassium: 3.8 mEq/L (ref 3.5–5.1)

## 2011-01-09 ENCOUNTER — Encounter (HOSPITAL_BASED_OUTPATIENT_CLINIC_OR_DEPARTMENT_OTHER): Payer: Medicare HMO | Admitting: Oncology

## 2011-01-09 ENCOUNTER — Other Ambulatory Visit: Payer: Self-pay | Admitting: Oncology

## 2011-01-09 DIAGNOSIS — E876 Hypokalemia: Secondary | ICD-10-CM

## 2011-01-09 DIAGNOSIS — C180 Malignant neoplasm of cecum: Secondary | ICD-10-CM

## 2011-01-24 NOTE — Op Note (Signed)
NAMEHIRAL, LUKASIEWICZ               ACCOUNT NO.:  [id number]   MEDICAL RECORD NO.:  [id number]          PATIENT TYPE:  AMB   LOCATION:  SDS                          FACILITY:  [facility]   PHYSICIAN:  Ardeth Sportsman, MD     DATE OF BIRTH:  1956/03/08   DATE OF PROCEDURE:  03/13/2007  DATE OF DISCHARGE:                               OPERATIVE REPORT   PRIMARY CARE PHYSICIAN:  Lucky Cowboy, M.D.   GASTROENTEROLOGIST:  Rachael Fee, MD.   SURGEON:  Ardeth Sportsman, MD.   ASSISTANT SURGEON:  None.   PREOPERATIVE DIAGNOSES:  1. Known choledocholithiasis; status post endoscopic retrograde      cholangiopancreatogram by Dr. Christella Hartigan.  2. Persistent biliary dyskinesia and biliary colic.   POSTOPERATIVE DIAGNOSES:  1. Mild persistent nonobstructing choledocholithiasis; status post      endoscopic retrograde cholangiopancreatogram.  2. Probable chronic cholecystitis.  3. Symptomatic cholecystolithiasis.   PROCEDURES PERFORMED:  1. Laparoscopic lysis of adhesions x 45 minutes (which equals 50% of      the case).  2. Laparoscopic cholecystectomy and intraoperative cholangiogram.   ANESTHESIA:  1. General anesthesia.  2. Local anesthetic in a field block and on all port sites.   SPECIMENS:  Gallbladder.   DRAINS:  None.   ESTIMATED BLOOD LOSS:  Less than 10 mL.   COMPLICATIONS:  None apparent.   INDICATIONS FOR PROCEDURE:  Ms. Escutia is a 55 year old female who had  episodes of biliary colic and jaundice.  She was admitted and underwent  ERCP with evacuation of common bile duct stones.  Postoperatively her  liver function tests seemed to normalize and she did not want to wait  for evaluation of her gallbladder.  She had persistent symptoms, then,  and felt it was reasonable to proceed.   The technique of laparoscopic cholecystectomy with intraoperative  cholangiogram was discussed.  The risks such as stroke, heart attack,  deep venous thrombosis, pulmonary embolism and death  were discussed.  The risks such as bleeding, need for transfusion, wound infection,  abscess, injury to other organs, incisional hernia, prolonged pain were  discussed.  The risks of bile duct injury resulting in the need for  internal stenting / external drainage / operative reconstruction were  discussed as well.  Other risks were discussed.  Questions were answered  and she agreed to proceed.   OPERATIVE FINDINGS:  She had a low-lying liver and a very large dilated  gallbladder with adhesions of her omentum, transverse colon and somewhat  of her duodenum as well.  She was extremely full of stones.  The  gallbladder wall was thickened, concerning for a prior episode of acute  cholecystitis and at least chronic cholecystitis.  She did have cystic  duct stones.   On cholangiogram, she did have some dilated right and left intrahepatic  ducts and common bile duct with a few small stones in the biliary  system, but they were easily mobile and contrast flowed easily into her  duodenum, so they were certainly not obstructing.   DESCRIPTION OF PROCEDURE:  Informed consent was confirmed.  The patient  received IV cefazolin given her history of mitral valve prolapse.  She  voided just prior to going to operating room.  She was positioned supine  with both arms tucked and care was made to make sure she was  comfortable, given her history of severe scoliosis.  She underwent  general anesthesia without difficulty.  Her abdomen was prepped and  draped in the sterile fashion.   Entry was gained into the abdomen with the patient in deep reverse  Trendelenburg and right side up using an optical entry with a 5 mm / 0-  degree scope.  A capnoperitoneum to 15 mmHg provided good abdominal  insufflation.  Under direct visualization, 5 mm ports were placed in the  right flank and through the umbilicus.  A 10 mm port was placed in the  subxiphoid  region.   Camera insertion revealed significant  adhesions in the right upper  quadrant.  Careful controlled dissection was done using minimal cautery  to avoid any injury to other organs.  This was done and ultimately I was  able to free the omentum off the gallbladder, elevate the gallbladder  cephalad and help free the transverse colon off the gallbladder and also  sweep the duodenum off.  The adhesions were most dense involving the  greater omentum.  The transverse colon and duodenal adhesions were  fortunately a little less intense and could be more safely freed off.   The peritoneal coverings between the gallbladder and the liver and the  anteromedial and posterolateral aspects were freed around  circumferentially, and basically almost the entire attachments between  of the gallbladder to the liver were almost freed off entirely except  for the area up near the fundus, so I could get a really good critical  view given her somewhat distorted anatomy with her scoliosis and her  adhesions.  I found one structure going from the anteromedial aspect of  the wall going down to the porta hepatis and it was then __________  to  the cystic artery and it was completely circumferentially skeletonized.  One clip on the gallbladder and two clips slightly proximal were made  and the cystic artery was removed.  Further dissection was done and came  down to the infundibulum to an obvious cystic duct.  There was no  evidence of any aberrant anatomy.  Initially three and ultimately five  clips were made on a very dilated infundibulum and a partial cystotomy  was performed.  I was able to milk back a couple of stones out of the  cystic duct.   A 5-French cholangiogram catheter was passed through a right subcostal  stab incision, flushed and passed into the cystic duct.  It flushed a  little hard, but ultimately did flush out well without any leak.  The  cholangiogram was done, doing initially two runs.  It was a little bit  of a challenge to get  orientation and the machine was not behaving very  well, but ultimately I was able to get a couple of good runs of  contrast.  Contrast flowed from cephalad from an obvious side branching  of the cystic duct to the common bile duct.  The common bile duct was  dilated.  Contrast flowed rapidly down into the duodenum.  There were a  couple of hyperlucent defects that were mobile, consistent with either  bubbles or possible small stones.  The cholangiogram was re-run to get a  better fill of the right and left intrahepatic  chains which ultimately I  was able to do.  Again, there were some mobile hyperlucencies consistent  with possible mild choledocholithiasis versus some air bubbles, but  contrast flowed well in the right and left intrahepatic chains and felt  to be otherwise to be pretty normal anatomy.  The cholangiocatheter was  removed.  Three clips were made on the long dilated cystic duct and  cystic duct transection was completed.  Because it was a dilated cystic  duct, we went ahead and put in a 0 PDS Endoloop on it as well to have  adequate occlusion.   The gallbladder was freed from its main attachments on the liver and  removed inside an EndoCatch bag with dilating the subxiphoid  incision.  Copious irrigation was done and excellent hemostasis made on the liver  bed and re-inspection was to make sure the duodenum and transverse colon  and greater omentum were all nice and quiet and there was no evidence of  any injury or bleeding.  The upper three abdominal ports were removed.  The capnoperitoneum was evacuated.  The umbilical port was removed.  The  subxiphoid  defects were closed with 0 Vicryl interrupted stitches x  two, one in a figure-of-eight fashion, with good result.  The skin was  closed using 4-0 Monocryl.  Sterile dressings were applied.  The patient  was extubated and taken to the recovery room in stable condition.   I explained the operative plan of recovery to the  patient herself and am  about to explain to rest of her family.      Ardeth Sportsman, MD  Electronically Signed     SCG/MEDQ  D:  03/13/2007  T:  03/13/2007  Job:  [id number]   cc:   Lucky Cowboy, M.D.  Rachael Fee, MD

## 2011-01-24 NOTE — Consult Note (Signed)
April Spence, April Spence               ACCOUNT NO.:  [id number]   MEDICAL RECORD NO.:  [id number]          PATIENT TYPE:  OBV   LOCATION:  1534                         [facility]ACILITY:  [facility]   PHYSICIAN:  Clovis Pu. Cornett, M.D.DATE OF BIRTH:  01/13/1956   DATE OF CONSULTATION:  04/22/2009  DATE OF DISCHARGE:                                 CONSULTATION   PHYSICIAN REQUESTING CONSULTATION:  Rachael Fee, MD   REASON FOR CONSULTATION:  Colon cancer.   HISTORY OF PRESENT ILLNESS:  The patient is a very pleasant 55 year old  female with scoliosis and spina bifida who was admitted for a screening  colonoscopy due to iron deficiency anemia because she required prep in-  house.  Her colonoscopy was performed yesterday and she was found to  have a large ascending colon cancer with signs of bleeding.  This is  partially obstructing.  I was asked to see her at the request of Dr.  Christella Hartigan for this.  She has had some mild crampy abdominal pain and some  mild right-sided pain but was able to tolerate her prep without any  significant amount of vomiting or other problem.  Currently, she has  very mild right upper quadrant pain.  She had had a previous  cholecystectomy back in 2008  by Dr. Star Age  for multiple  gallstones and common duct stones.  Her stools have been dark and she  was placed on iron in February due to anemia.   PAST MEDICAL HISTORY:  1. Spina bifida.  2. Chronic constipation.  3. Some problems with fecal continent control.  4. Anemia.  5. Obesity.  6. She is wheelchair dependent.  7. Severe scoliosis.  8. Hypothyroidism.   PAST SURGICAL HISTORY:  1. Laparoscopic cholecystectomy as above.  2. ERCP has above.  3. Bilateral tubal ligation.   ALLERGIES:  None.   FAMILY HISTORY:  Colitis in her mother.   SOCIAL HISTORY:  She is married.  No children.  Denies any significant  tobacco use and minimal alcohol use.   MEDICATIONS:  At home include:  1. Tricor 145 mg  daily.  2. Tramadol as needed.  3. Prozac 20 mg daily.  4. Levothyroxine 100 mcg daily.  5. Amitriptyline 25 mg t.i.d.  6. Vitamin D daily.  7. Magnesium and iron supplements.  8. B12 supplements.   REVIEW OF SYSTEMS:  Positive for mild crampy abdominal pain, dark  stools, weakness and fatigue.   PHYSICAL EXAMINATION:  HEENT:  Extraocular movements are intact.  No  evidence of jaundice.  NECK:  Supple, nontender.  Trachea midline.  PULMONARY:  Lung sounds are clear.  Chest wall motion is normal  bilaterally.  CARDIOVASCULAR:  Regular rate and rhythm without rub, murmur or gallop.  EXTREMITIES:  Well perfused.  ABDOMEN:  Soft, nontender.  No rebound, no guarding.  Obese, no masses.  EXTREMITIES:  Significant muscle wasting noted.  She is wheelchair  dependent.  NEUROLOGIC EXAMINATION:  Again, she is very weak in the lower  extremities and wheelchair dependent.  She has severe scoliosis.   DIAGNOSTIC STUDIES:  I reviewed her  colonoscopy report with Dr. Christella Hartigan  which shows a right-sided fungating colon lesion.  Abdominal pelvic CT  scan shows the same lesion with adenopathy without any evidence of  metastatic disease.  Her hemoglobin is 8.5.  White count 7900, platelet  count 545,000.  Sodium 140, potassium 3.4, chloride 103, CO2 was 29, BUN  5, creatinine 0.5, glucose 121.   IMPRESSION:  1. Ascending colon cancer.  2. Spina bifida.  3. History of constipation.  4. History of fecal incontinence.   PLAN:  At this point in time I feel she can be discharged home.  There  is no emergent need for her surgery and we will try to get her scheduled  for a laparoscopic-assisted right hemicolectomy either at the end of  next week or week after next depending on operating room schedule and  surgeon availability.  Certainly she is not obstructed from this  clinically.  We will prepare her as an outpatient gently the best we can  and get this set up.  She will need to come back to see  either myself or  Dr. Michaell Cowing next week so we can set up surgery and I will begin to work on  finding time for either myself to do the case or Dr. Michaell Cowing to do the  case depending on availability.  I have discussed with the family the  potential risks of surgery include bleeding, infection, anastomotic  leakage, ostomy, injury to neighboring organs, small bowel obstruction,  wound problems, wound infections, hernias.      Thomas A. Cornett, M.D.  Electronically Signed     TAC/MEDQ  D:  04/22/2009  T:  04/22/2009  Job:  [id number]

## 2011-01-24 NOTE — Op Note (Signed)
NAMEJESSEY, April Spence NO.:  [id number]   MEDICAL RECORD NO.:  [id number]          PATIENT TYPE:  INP   LOCATION:  1539                         FACILITY:  [facility]   PHYSICIAN:  Ardeth Sportsman, MD     DATE OF BIRTH:  02/10/1956   DATE OF PROCEDURE:  DATE OF DISCHARGE:                               OPERATIVE REPORT   PRIMARY CARE PHYSICIAN:  Lucky Cowboy, M.D.   GASTROENTEROLOGIST:  Rachael Fee, MD.   SURGEON:  Ardeth Sportsman, MD.   ASSISTANT:  Adolph Pollack, M.D.   PREOPERATIVE DIAGNOSIS:  Adenocarcinoma of the ascending colon.   POSTOPERATIVE DIAGNOSIS:  Adenocarcinoma of the ascending colon.   PROCEDURE PERFORMED:  1. Laparoscopic lysis of adhesions x30 minutes (equals 33% of the      case).  2. Laparoscopically-assisted right hemicolectomy with anastomosis.   ANESTHESIA:  1. General anesthesia.  2. Local anesthetic in a field block around all port sites and      extraction site.  3. On-Q bupivacaine anesthetic infusion pump.   DRAINS:  None.   ESTIMATED BLOOD LOSS:  250 mL.   COMPLICATIONS:  None major.   INDICATIONS:  Ms. Ginty is a pleasant 55 year old female with spina  bifida, severe scoliosis and footdrop, who had a large right sided  ascending colon mass noted for workup of anemia.  She was initially  evaluated by Dr. Harriette Bouillon, my partner.  Unfortunately, he was not  able to fit into surgery for several weeks.  Given her moderate anemia  and myself having operated on her 2 years ago and me having a sooner  availability, Dr. Luisa Hart offered and the patient agreed to have me be  the surgeon.   The native physiology of the digestive chest explained.  Pathophysiology  of adenocarcinoma of the colon was discussed.  Options discussed and  recommendations made for laparoscopically assisted, possible open  resection of the colon containing the mass with anastomosis.  The risks,  benefits and alternatives were discussed.   Questions answered and she  and her husband agreed to proceed.   OPERATIVE FINDINGS:  She had a very large bulky tumor involving her  proximal ascending colon with greater omentum near it suspicious for a  T4 type lesion.  I could not feel any obvious bulky lymphatic disease or  any evidence of metastasis.  She had a moderate amount of adhesions in  the right upper quadrant from her prior cholecystectomy.   DESCRIPTION OF PROCEDURE:  Informed consent was confirmed.  The patient  received IV Invanz prior to surgery.  She had sequential compression  devices active during the entire case.  She was given subcutaneous  heparin preoperatively.  She was given oral alvimopan per anti-ileus  protocol.  She underwent general anesthesia without any difficulty.  She  was positioned in the low lithotomy with arms tucked.  Her abdomen and  perineum were clipped, prepped and draped in a sterile fashion.   A surgical timeout confirmed our plan.   I placed a #5 mm port in left upper quadrant using optical entry  technique with the patient in steep reversed Trendelenburg and left-side  up.  Entry was clean and camera inspection revealed no intra-abdominal  injury.  Under visualization, I placed 5-mm ports in the left flank  suprapubically and the right flank.  A 10-mm port was placed in the  umbilical region and later in the case replaced with a GelPort.   Diagnostic laparoscopy revealed moderate adhesions in the right upper  quadrant.  I went ahead and freed that off to free the omentum off its  attachment and help sweep it out of the way.  The greater omentum  adhesions was mostly swept away, although there were some adhesions down  into the right lower quadrant.  There was a stellate hard mass in the  proximal ascending colon.  I ended up using an Enseal bipolar  instrumentation, leaving a wedge of greater omentum that was adherent to  the cancer intact.   Initially, I tried to sweep the small  bowel out of the pelvis, but with  her severe scoliosis and lordosis her mesentery was a little bit  atypical and it was hard to keep out of the pelvis.  It was hard to get  a good classic view to see the mesenteric base on the right side on the  ileocolic.  I could not get a good view to get to do a medial to lateral  approach.  With repositioning, I could see the ileocolic junction with  the appendix.  I started mobilizing colon in a lateral to medial fashion  using some cold scissors and so cauterization and blunt dissection to  free the ileocolic region off the right lower quadrant and off the psoas  carefully.  I was somewhat limited with the angulation.   Therefore, I ended up creating a window between the mid transverse colon  and the greater omentum and started mobilizing the right colon in a  superior to inferior fashion.  The superficial planes were a bit  challenging since her prior cholecystectomy, but I tried to leave most  of the greater omentum adherent to the costal ridge to stay out of the  way.  Eventually, I was able to get in a good window and actually could  see the duodenum.  I carefully swept that posteriorly and was able to  free the proximal transverse colon and hepatic flexure off its  attachments of the liver retroperitoneum and sidewall.  With that, I  could come around and mobilize in a superolateral to inferomedial  fashion and help better mobilize the right side of the colon.   I tried to extract the specimen and while it was a very large bulky  space that I could just pull out the GelPort wound protector, I did not  have great mobilization and, therefore, I returned the specimen back in  the abdomen.  With the dissection, I could better isolate the ileocolic vessels.  I  could see them coming down to their takeoff off of the aorta.  I was  able to create a window between a more avascular part of the more distal  ascending colon.  I then skeletonized,  isolated and ligated using the  ileocolic artery and ileocolic vein separately using Enseal bipolar  cauterization using three separate burns and a transection on the more  proximal end to good result.  That helped mobilize the ileocolic region.   I turned attention and mobilized the transverse colon up towards the  splenic flexure off of very bulky  greater omentum.  She had a shortened  mesentery at her middle colic vessels which sort of limited some of the  mobility, but eventually I was able to get a good window there.  There  were a few tears in the mesentery in this region that were easily  controlled with bipolar cauterization.   With better mobilization, I could extract and get the entire right colon  out very well with good proximal and distal margins.  The terminal ileum  mesentery was a little beaten up, so I ended up choosing 10 cm more  proximally from the ileocecal region.  I chose a spot in the  midtransverse colon that came up well and did an ileocolonic anastomosis  using the GIA 75 stapler and then using a TA-90 to staple off the ileum  and colon across the common defect.  I ended up taking the mesentery in  a ray like fashion.  The right colic was taken and the right branch of  the middle colic was taken as well during this.  There was a little  branch in the right middle colic that had a tear, but this was  controlled with clamps and a silk tie.  Inspection of the anastomoses  was nice and viable.  There was some slight oozers from the staple line  that were controlled with 3-0 Vicryl stitches.  I closed the common  mesenteric defect using 3-0 and 2- 0 interrupted figure-of-eight  stitches from the ileocolonic anastomosis down as posterior as I could.  I completed the more proximal end using a 2-0 silk running stitch using  laparoscopic intracorporeal suturing until I had the complete mesenteric  defect entirely closed to good result.   I did copious irrigation with  clear return at the end.  Hemostasis was  excellent at the end.  The capnoperitoneum was evacuated through the  ports.  I placed the peel-away sheaths in the retrorectal/preperitoneal  plane to good result.  I closed the periumbilical fascia using #1 looped  PDS in a running fashion to good result.  The skin on the port sites  closed using 4-0 Monocryl stitch.  The skin in the midline was closed  using interrupted 4-0 Monocryl stitch.  I used triple antibiotic  ointment and then two Telfa wicks superiorly and inferiorly to help  drain the deep space well.  The On-Q pain pump catheters were placed and  secured with Steri-Strips and Tegaderm.   The patient was extubated and sent to the recovery room in stable  condition.  I explained postop care with the patient and her husband in  detail just prior to surgery after discussing in the office last week,  but I discussed with her husband one more time after surgery.      Ardeth Sportsman, MD  Electronically Signed     SCG/MEDQ  D:  05/03/2009  T:  05/03/2009  Job:  [id number]   cc:   Rachael Fee, MD  [address]  [place], [place] [address]   Lucky Cowboy, M.D.  Fax: [phone]

## 2011-01-24 NOTE — Discharge Summary (Signed)
NAMEDIDI, GANAWAY               ACCOUNT NO.:  [id number]   MEDICAL RECORD NO.:  [id number]          PATIENT TYPE:  OBV   LOCATION:  1534                         FACILITY:  [facility]   PHYSICIAN:  Rachael Fee, MD   DATE OF BIRTH:  [birth year]   DATE OF ADMISSION:  04/21/2009  DATE OF DISCHARGE:  04/23/2009                               DISCHARGE SUMMARY   ADMITTING DIAGNOSES.:  51. A 55 year old white female admitted for bowel prep for colonoscopy      for further evaluation of iron deficiency anemia, status post      incomplete colonoscopy, November 03, 2008.  2. The patient is wheelchair-bound and unable to adequately prep at      home, secondary to history of spina bifida and severe scoliosis.  3. Chronic constipation.  4. History of depression.  5. Hypertension  6. Hypothyroidism.   DISCHARGE DIAGNOSES:  1. Ascending colon adenocarcinoma, nonobstructing, with CT scan      positive for pericolonic adenopathy, no evidence for distant      metastases.  2. Anemia, normocytic, secondary to chronic blood loss.  3. The patient is wheelchair-bound and unable to adequately prep at      home, secondary to history of spina bifida and severe scoliosis.  4. Chronic constipation.  5. History of depression.  6. Hypertension  7. Hypothyroidism.   CONSULTATIONS:  Surgery, Dr. Luisa Hart.   PROCEDURES:  Colonoscopy, Dr. Christella Hartigan, April 22, 2009.   BRIEF HISTORY:  Shakeema is a 55 year old female, a primary patient of  Dr. Oneta Rack, with history of iron deficiency anemia and chronic  constipation.  She had undergone a colonoscopy, late February 2010,  which was incomplete secondary to very poor prep.  She was to be  rescheduled.  She was referred back recently per Dr. Oneta Rack with a  drift in her hemoglobin to about 8.5.  She is on iron supplementation.  She is, at this time, admitted to the hospital for assistance with  extended bowel prep.  She has had a very difficult time of prepping at  home secondary to her wheelchair-bound state.   LABORATORY STUDIES:  On admission, WBC of 7.9, hemoglobin 8.5,  hematocrit of 26, MCV of 87, platelets 546.  Potassium was 3.4, BUN 121,  creatinine 0.47, BUN 5.  CEA is 0.9.   X-RAY STUDIES:  CT scan of the chest, abdomen and pelvis on August 12:  Chest is clear with severe thoracolumbar levoscoliosis, no evidence of  mediastinal or hilar soft-tissue masses, no adenopathy, no liver masses  identified, status post cholecystectomy.  Concentric wall thickening is  seen involving the ascending colon, consistent with recently-diagnosed  colon carcinoma.  There are pericolonic lymph nodes seen within the  right lower quadrant mesentery, adjacent to the right colon, largest  measuring 1.4 cm.  No other pathologically enlarged nodes in the pelvis.  There is a partially calcified uterine fibroid.   HOSPITAL COURSE:  The patient was admitted to the service of Dr. Wendall Papa.  She underwent a bowel prep, tolerated this well and had  colonoscopy on August 12.  She  was found to have an ascending colon  circumferential mass, causing partial obstruction, with inability to  examine the colon proximal to the mass.  Biopsies were taken and are  pending at the time of this discharge.  She also had external  hemorrhoids.  She underwent staging CT scan with findings as outlined  above and we obtained surgical consultation in hopes that she could  undergo surgical procedure while hospitalized, due to her disability.  She was seen by Dr. Luisa Hart and, unfortunately, there is no OR time  available before August 23.  She will actually be scheduled with Dr.  Michaell Cowing on May 03, 2009 and he will see her in the office next week for  preop evaluation.   She is discharged to home today with instructions to maintain a regular  diet, continue all of her usual medications.  We we will defer to Dr.  Michaell Cowing regarding of preoperative transfusion.   CONDITION ON  DISCHARGE:  Stable.      Mike Gip, PA-C      Rachael Fee, MD  Electronically Signed    AE/MEDQ  D:  04/23/2009  T:  04/23/2009  Job:  [id number]   cc:   Lucky Cowboy, M.D.  Fax: [phone]   Ardeth Sportsman, MD  [address] [place] [address]

## 2011-01-24 NOTE — H&P (Signed)
NAMEDEAZIA, LAMPI               ACCOUNT NO.:  [id number]   MEDICAL RECORD NO.:  [id number]          PATIENT TYPE:  OBV   LOCATION:  1534                         FACILITY:  [facility]   PHYSICIAN:  Rachael Fee, MD   DATE OF BIRTH:  September 15, 1955   DATE OF ADMISSION:  04/21/2009  DATE OF DISCHARGE:                              HISTORY & PHYSICAL   REASON FOR ADMISSION:  Bowel prep for colonoscopy in a patient we  wheelchair bound and unable to complete prep at home.   HISTORY:  Stanton Kidney is a 55 year old female primary patient of Lucky Cowboy, M.D. who was referred for iron deficiency anemia.  She also has  chronic constipation.  She had attempted colonoscopy in February of 2010  per Rachael Fee, MD.  This was an incomplete exam due to extremely  poor prep.  She was to have repeat procedure with extended prep.  She  did not come back immediately and at this point was referred back by  Lucky Cowboy, M.D. with a drift in her hemoglobin.  The most recent  hemoglobin as an outpatient was 8.5.  She was seen and set up for  colonoscopy with plans at this time for a admission to complete prep  with assistance.  She has history of spina bifida has had several back  surgeries and severe scoliosis and for that reason has been wheelchair  bound most of her life.   PAST HISTORY:  1. She is status post lap chole in 2008 and had ERCP at that time as      well.  2. Spina bifida status post multiple prior surgeries and history of      severe scoliosis.  3. Bilateral tubal ligation.  4. History of depression.  5. Hypothyroidism.  6. Status post knee repair.   MEDS:  1. Tricor 145 daily.  2. Tramadol on a p.r.n. basis.  3. Propranolol HCl 80 mg daily.  4. Prozac 20 daily.  5. Levothyroxine 100 mEq daily.  6. Amitriptyline 25 mg three at bedtime.  7. Vitamin D 5000 mg daily.  8. Magnesium supplement daily.  9. Iron 325 mg daily.  10.She also takes a B12 supplement twice monthly.   ALLERGIES:  NO KNOWN ALLERGIES.   FAMILY HISTORY:  Brother with history of colon polyps.  Mother with  history of colitis.   SOCIAL HISTORY:  The patient is married.  She does not have any  children.  She is an ex-smoker.  Uses alcohol rarely.   REVIEW OF SYSTEMS:  HEENT:  Negative.  CARDIOVASCULAR:  Denies any chest  pain or anginal symptoms.  PULMONARY:  Negative for cough, shortness of  breath or sputum production.  GI:  As outlined above.  GU: Negative.  NEURO/PSYCH:  Negative.  MUSCULOSKELETAL:  Positive for severe scoliosis  and spina bifida.  She is wheelchair bound.   LABORATORY STUDIES:  On admission on August 11, showed a WBC of 7.9,  hemoglobin 8.5, hematocrit of 26, MCV of 87, platelets 547.  Potassium  3.4, BUN 5, creatinine 0.47.   X-RAY STUDIES:  None on  admission.   PHYSICAL EXAMINATION:  White female in no acute distress.  Blood pressure 107/74, temperature 98, pulse 89, saturation 98.  HEENT:  Nontraumatic, normocephalic.  EOMI, PERRLA.  CARDIOVASCULAR:  Regular rate and rhythm with S1 and S2.  PULMONARY:  Clear to A and P.  ABDOMEN:  Soft and nontender, nondistended and normal active bowel  sounds.  No palpable mass or hepatosplenomegaly.  RECTAL:  Not send done at this time as planned for colonoscopy.  EXTREMITIES:  With muscular atrophy.  The patient is wheelchair-bound.  BACK:  With severe skeletal deformity.  NEURO:  Patient is alert and oriented x3 and exam is grossly nonfocal.   IMPRESSION:  59. A 55 year old female with persistent iron deficiency anemia and      incomplete colonoscopy on November 03, 2008, admitted at this time      for repeat colonoscopy with extended bowel preparation to rule-out      colon lesion.  2. Chronic constipation.  3. History of spina bifida and severe scoliosis confined to a      wheelchair  4. History of depression.  5. Hypertension.  6. Hypothyroidism.   PLAN:  In-hospital bowel prep with nursing assistance and  colonoscopy  scheduled for 8:30 a.m. on August 12, with Rachael Fee, MD.  For  details please see the orders.      Mike Gip, PA-C      Rachael Fee, MD  Electronically Signed    AE/MEDQ  D:  04/22/2009  T:  04/22/2009  Job:  [id number]   cc:   Lucky Cowboy, M.D.  Fax: [phone]

## 2011-01-24 NOTE — Discharge Summary (Signed)
April Spence, April Spence               ACCOUNT NO.:  [id number]   MEDICAL RECORD NO.:  [id number]          PATIENT TYPE:  INP   LOCATION:  1539                         [facility]ACILITY:  [facility]   PHYSICIAN:  Ardeth Sportsman, MD     DATE OF BIRTH:  1955/10/27   DATE OF ADMISSION:  05/03/2009  DATE OF DISCHARGE:                               DISCHARGE SUMMARY   PRIMARY CARE PHYSICIAN:  Dr. Lucky Cowboy.   GASTROENTEROLOGIST:  Dr. Rob Bunting.   ONCOLOGIST:  Probably going to be Jillyn Hidden B. Sherrill at [facility].   PRINCIPAL FINAL DISCHARGE DIAGNOSES:  Adenocarcinoma of the  cecum/ascending colon.   OTHER DIAGNOSES:  1. Spina bifida.  2. 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  3. Scoliosis packs.  4. Hypothyroidism.  5. Chronic low-back pain.  6. Mitral valve prolapse.  7. Hypertension.  8. Chronic constipation.   PROCEDURE PERFORMED:  Laparoscopically assisted right colectomy with  anastomosis on May 03, 2009.   SUMMARY OF HOSPITAL COURSE:  April Spence is a 55 year old female with  above issues.  She was found to have significant anemia.  She had an  endoscopy that revealed a large tumor in her proximal ascending colon.  Workup did not show any diffuse metastatic disease.  She is having  surgical elevation.  I performed a laparoscopic right colectomy for some  lysis of adhesions given her prior surgeries.   Postoperatively, she was placed on the anti-ileus protocol.  She was  started on liquids and by the time of discharge was tolerating a solid  diet, eating over 50% of her meals.  She was transitioned over to oral  pain medications and had adequate pain control.  Physical therapy  consultation was made and they followed her and the patient has husband  been felt that she was back to her baseline of mobility and could  tolerate mobility at home without aid of a home health nurse.  She did  have a little difficult with urination and  required and one intermittent  catheterization, but otherwise was voiding fine on her own at the time  of discharge.   Based on these improvements all parties felt it was reasonable to  discharge home on postop day #3 with the follow instructions;  1. She should return to the clinic to see me about 1-2 weeks.  2. She should shower, however, keeping the incisions clean and dry.      The small wicks at her umbilical incision should be removed in 2      days.  If there is worsening drainage, pain or other concerns she      should call.  3. She should take Citrucel fiber twice a day to keep her regular.  If      she has severe constipation, she may take of Milk of Magnesia.  If      she has severe diarrhea, she could take Imodium if she needs to.      If she has worsening pain, nausea, vomiting, diarrhea, hematochezia      or  other concerns, she should call us.  4. She should increase in physical activity to her baseline and      transfers, etc.  5. She should call for any other concerns.  6. I did reveal the pathology results to the patient and her husband.      This puts her at a stage where she could benefit from postadjuvant      chemotherapy.  I reminded then to focus on recovering from surgery.      We worked to coordinate outpatient visit with Dr. Truett Perna or one      of his partner's.  Even though they live in [place], they wanted      the initial visit to be [place] and depending on what      recommendations are made and what their plans were they could      modify if from that point on.  Dr. Marca Ancona initially saw her,      but I assumed care for the patient from there.      Ardeth Sportsman, MD  Electronically Signed     SCG/MEDQ  D:  05/06/2009  T:  05/06/2009  Job:  [id number]   cc:   Lucky Cowboy, M.D.  Fax: [phone]   Rachael Fee, MD  [address]  [place], [place] [address]   Leighton Roach Truett Perna, M.D.  Fax: [phone]   Clovis Pu. Cornett, M.D.  [address] [place] [address]  [place] [address]

## 2011-01-27 NOTE — Assessment & Plan Note (Signed)
[facility]                         GASTROENTEROLOGY OFFICE NOTE   MARYLN, EASTHAM                      MRN:          [id number]  DATE:01/04/2007                            DOB:          April 22, 1956    REASON FOR REFERRAL:  Dr. Oneta Rack asked me to evaluate [name] in  consultation regarding elevated liver tests, recent abdominal pain.   HISTORY OF PRESENT ILLNESS:  [name] is a very pleasant 55 year old  woman who had about a week of intermittent right upper quadrant pains,  nausea.  During this time, she had lab tests and imaging studies  performed by her primary care physician.  Initial liver tests show a  total bili of 3.2 with direct of 2.  Her AST was 254 and her ALT was  272.  She underwent a CT scan of her abdomen and pelvis with IV and oral  contrast.  The findings showed severe scoliosis, poor look at the  pancreatic head, mild common duct dilation with a question of a stone or  periampullary lesion. She had repeat liver tests done yesterday just  prior to this appointment and they have nearly all normalized.  The only  continued abnormal liver tests are her AST of 43 and her ALT of 62.  CBC  was done yesterday as well and that was normal.   Her pain was in the right upper quadrant, it was on and off, and it  lasted for approximately a week.  She has been a week pain-free and no  nausea.  She had no fevers during this episode.   REVIEW OF SYSTEMS:  Notable for stable weight, otherwise essentially  normal and is available on the nursing intake sheet.   PAST MEDICAL HISTORY:  Severe scoliosis.  Elevated cholesterol, low  thyroid, depression, tubal ligation, knee surgery 2005, back surgeries  from age 33 to age 27, spina bifida.   CURRENT MEDICATIONS:  Tricor, tramadol, propranolol, Prozac,  levothyroxine, Elavil, hydrochlorothiazide, fish oil, vitamin A,  calcium.   ALLERGIES:  No known drug allergy.   SOCIAL HISTORY:  Married.   No children.  Quit smoking 5 months ago.  Rarely drinks alcohol.   FAMILY HISTORY:  Brother with colon polyps.  Mother with colitis.   PHYSICAL EXAMINATION:  Is in a wheelchair. Height 5 foot 0 inches, 136  pounds.  Blood pressure 112/62, pulse 80.  CONSTITUTIONAL:  Generally well-appearing.  NEUROLOGICAL:  Alert and oriented x3.  EYES:  Extraocular movements intact.  MOUTH:  Oropharynx moist, no lesions.  NECK:  Supple.  No lymphadenopathy.  CARDIOVASCULAR:  Heart regular rate and rhythm.  LUNGS:  Clear to auscultation bilaterally.  ABDOMEN:  Soft, nontender, nondistended.  Normal bowel sounds.  EXTREMITIES:  No lower extremity edema.  SKIN:  No rashes or lesions on visible extremities.   ASSESSMENT AND PLAN:  A 55 year old woman with recent right upper  quadrant pain, abnormal liver tests, dilated common bile duct.   She likely has common duct stone.  There was suboptimal imaging of her  pancreas and so my plan will be to perform endoscopic ultrasound first  to get a good look at her pancreas and to document that she truly does  have a stone.  If a stone is seen, at the same time I will perform an  ERCP for stone removal.  My biggest concern is her severe scoliosis may  lead to difficult or technically impossible cannulation of bile duct due  to her abnormal anatomy.  We will therefore arrange for her to have  endoscopic ultrasound followed by ERCP.  I see no reason for any further  blood tests or imaging studies prior to that.     Rachael Fee, MD  Electronically Signed    DPJ/MedQ  DD: 01/04/2007  DT: 01/04/2007  Job #: [id number]   cc:   Lucky Cowboy, M.D.

## 2011-03-31 ENCOUNTER — Encounter: Payer: Medicare HMO | Admitting: Genetic Counselor

## 2011-05-02 ENCOUNTER — Other Ambulatory Visit: Payer: Self-pay | Admitting: Oncology

## 2011-05-02 ENCOUNTER — Encounter (HOSPITAL_BASED_OUTPATIENT_CLINIC_OR_DEPARTMENT_OTHER): Payer: Medicare HMO | Admitting: Oncology

## 2011-05-02 ENCOUNTER — Ambulatory Visit (HOSPITAL_COMMUNITY)
Admission: RE | Admit: 2011-05-02 | Discharge: 2011-05-02 | Disposition: A | Discharge status: Home / Self Care | Payer: Medicare HMO | Source: Ambulatory Visit | Attending: Oncology | Admitting: Oncology

## 2011-05-02 ENCOUNTER — Encounter (HOSPITAL_COMMUNITY): Payer: Self-pay

## 2011-05-02 DIAGNOSIS — C189 Malignant neoplasm of colon, unspecified: Secondary | ICD-10-CM | POA: Insufficient documentation

## 2011-05-02 DIAGNOSIS — Z9221 Personal history of antineoplastic chemotherapy: Secondary | ICD-10-CM | POA: Insufficient documentation

## 2011-05-02 DIAGNOSIS — C180 Malignant neoplasm of cecum: Secondary | ICD-10-CM

## 2011-05-02 DIAGNOSIS — C772 Secondary and unspecified malignant neoplasm of intra-abdominal lymph nodes: Secondary | ICD-10-CM | POA: Insufficient documentation

## 2011-05-02 DIAGNOSIS — R911 Solitary pulmonary nodule: Secondary | ICD-10-CM | POA: Insufficient documentation

## 2011-05-02 DIAGNOSIS — R599 Enlarged lymph nodes, unspecified: Secondary | ICD-10-CM | POA: Insufficient documentation

## 2011-05-02 DIAGNOSIS — C787 Secondary malignant neoplasm of liver and intrahepatic bile duct: Secondary | ICD-10-CM | POA: Insufficient documentation

## 2011-05-02 HISTORY — DX: Malignant (primary) neoplasm, unspecified: C80.1

## 2011-05-02 LAB — CBC WITH DIFFERENTIAL/PLATELET
BASO%: 0.5 % (ref 0.0–2.0)
HCT: 35.1 % (ref 34.8–46.6)
LYMPH%: 28.6 % (ref 14.0–49.7)
MCHC: 33.8 g/dL (ref 31.5–36.0)
MCV: 90.2 fL (ref 79.5–101.0)
MONO#: 0.5 10*3/uL (ref 0.1–0.9)
MONO%: 7.3 % (ref 0.0–14.0)
NEUT%: 61.8 % (ref 38.4–76.8)
Platelets: 276 10*3/uL (ref 145–400)
RBC: 3.89 10*6/uL (ref 3.70–5.45)
WBC: 7.5 10*3/uL (ref 3.9–10.3)

## 2011-05-02 LAB — CMP (CANCER CENTER ONLY)
ALT(SGPT): 26 U/L (ref 10–47)
AST: 31 U/L (ref 11–38)
Albumin: 3.4 g/dL (ref 3.3–5.5)
Alkaline Phosphatase: 78 U/L (ref 26–84)
BUN, Bld: 12 mg/dL (ref 7–22)
CO2: 29 meq/L (ref 18–33)
Calcium: 9.3 mg/dL (ref 8.0–10.3)
Chloride: 93 meq/L — ABNORMAL LOW (ref 98–108)
Creat: 0.5 mg/dL — ABNORMAL LOW (ref 0.6–1.2)
Glucose, Bld: 119 mg/dL — ABNORMAL HIGH (ref 73–118)
Potassium: 4.5 meq/L (ref 3.3–4.7)
Sodium: 136 meq/L (ref 128–145)
Total Bilirubin: 0.4 mg/dL (ref 0.20–1.60)
Total Protein: 8.1 g/dL (ref 6.4–8.1)

## 2011-05-02 MED ORDER — IOHEXOL 300 MG/ML  SOLN
100.0000 mL | Freq: Once | INTRAMUSCULAR | Status: AC | PRN
  Administered 2011-05-02: 100 mL via INTRAVENOUS

## 2011-05-09 ENCOUNTER — Encounter (HOSPITAL_BASED_OUTPATIENT_CLINIC_OR_DEPARTMENT_OTHER): Payer: Medicare HMO | Admitting: Oncology

## 2011-05-09 DIAGNOSIS — E876 Hypokalemia: Secondary | ICD-10-CM

## 2011-05-09 DIAGNOSIS — C180 Malignant neoplasm of cecum: Secondary | ICD-10-CM

## 2011-05-12 ENCOUNTER — Encounter (HOSPITAL_COMMUNITY)
Admission: RE | Admit: 2011-05-12 | Discharge: 2011-05-12 | Disposition: A | Discharge status: Home / Self Care | Payer: Medicare HMO | Source: Ambulatory Visit | Attending: Surgery | Admitting: Surgery

## 2011-05-12 LAB — CBC
MCH: 28.9 pg (ref 26.0–34.0)
Platelets: 312 10*3/uL (ref 150–400)
RBC: 4.19 MIL/uL (ref 3.87–5.11)
WBC: 7.7 10*3/uL (ref 4.0–10.5)

## 2011-05-12 LAB — BASIC METABOLIC PANEL
BUN: 12 mg/dL (ref 6–23)
Calcium: 10 mg/dL (ref 8.4–10.5)
Glucose, Bld: 112 mg/dL — ABNORMAL HIGH (ref 70–99)

## 2011-05-16 ENCOUNTER — Ambulatory Visit (HOSPITAL_COMMUNITY)
Admission: RE | Admit: 2011-05-16 | Discharge: 2011-05-16 | Disposition: A | Discharge status: Home / Self Care | Payer: Medicare HMO | Source: Ambulatory Visit | Attending: Surgery | Admitting: Surgery

## 2011-05-16 ENCOUNTER — Ambulatory Visit (HOSPITAL_COMMUNITY): Payer: Medicare HMO

## 2011-05-16 ENCOUNTER — Other Ambulatory Visit (INDEPENDENT_AMBULATORY_CARE_PROVIDER_SITE_OTHER): Payer: Self-pay | Admitting: Surgery

## 2011-05-16 DIAGNOSIS — C189 Malignant neoplasm of colon, unspecified: Secondary | ICD-10-CM

## 2011-05-16 DIAGNOSIS — Z01812 Encounter for preprocedural laboratory examination: Secondary | ICD-10-CM | POA: Insufficient documentation

## 2011-05-16 DIAGNOSIS — Z01818 Encounter for other preprocedural examination: Secondary | ICD-10-CM | POA: Insufficient documentation

## 2011-05-16 DIAGNOSIS — C787 Secondary malignant neoplasm of liver and intrahepatic bile duct: Secondary | ICD-10-CM | POA: Insufficient documentation

## 2011-05-16 DIAGNOSIS — Z01810 Encounter for preprocedural cardiovascular examination: Secondary | ICD-10-CM | POA: Insufficient documentation

## 2011-05-17 ENCOUNTER — Encounter (HOSPITAL_BASED_OUTPATIENT_CLINIC_OR_DEPARTMENT_OTHER): Payer: Medicare HMO | Admitting: Oncology

## 2011-05-17 DIAGNOSIS — C180 Malignant neoplasm of cecum: Secondary | ICD-10-CM

## 2011-05-17 DIAGNOSIS — C787 Secondary malignant neoplasm of liver and intrahepatic bile duct: Secondary | ICD-10-CM

## 2011-05-18 ENCOUNTER — Other Ambulatory Visit: Payer: Self-pay | Admitting: Oncology

## 2011-05-18 DIAGNOSIS — Z95828 Presence of other vascular implants and grafts: Secondary | ICD-10-CM

## 2011-05-18 NOTE — Op Note (Signed)
NAMEMarland Kitchen  BLAKELY, MARANAN NO.:  [id number]  MEDICAL RECORD NO.:  [id number]  LOCATION:  XRAY                         FACILITY:  [facility]  PHYSICIAN:  Ardeth Sportsman, MD     DATE OF BIRTH:  1956-07-13  DATE OF PROCEDURE:  05/16/2011 DATE OF DISCHARGE:                              OPERATIVE REPORT   PRIMARY CARE PHYSICIAN:  Lucky Cowboy, MD  MEDICAL ONCOLOGIST:  Ladene Artist, MD  SURGEON:  Ardeth Sportsman, MD  ASSISTANT:  RN.  PREOPERATIVE DIAGNOSIS:  Colorectal cancer metastatic to the liver, need for chemotherapy.  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  PROCEDURE PERFORMED:  Placement of Port-A-Catheter using ultrasound and fluoroscopic guidance.  ANESTHESIA: 1. Monitored IV sedation. 2. Local anesthetic and a field block.  SPECIMENS:  None.  DRAINS:  None.  ESTIMATED BLOOD LOSS:  Minimal.  COMPLICATIONS:  None apparent.  INDICATIONS:  Ms. Kilmartin is a pleasant 55 year old female with spina bifida and severe scoliosis who had had a laparoscopic-assisted right colectomy in 2010 by myself.  She was initially staged as T3N1.  She had post adjuvant chemotherapy.  She unfortunately developed a recurrence in her liver now 2 years out.  She had already had her port removed when it was felt she was disease free.  She has then developed recurrence.  It was felt she would benefit from chemotherapy and then recommended replacement of Port-A-Catheter for long term use.  The technique of placement was discussed.  Risks, benefits, and alternatives were discussed.  Risks of bleeding, infection, catheter occlusion, pneumothorax, etc. were discussed.  Given the fact that I had placed in the left side, I recommended placing on the right side.  Using ultrasound and fluoroscopy to help minimize risks were discussed as well.  Questions were answered and she agreed to proceed.  OPERATIVE FINDINGS:  The tip  appeared to be at the superior vena cava/right atrial junction.  She has severe scoliosis in her thoracolumbar spine turning towards the right.  There is no evidence of pneumothorax.  It is an 8-French Power Port MRI compatible, reference number [id number], lot number [id number].  DESCRIPTION OF PROCEDURE:  Informed consent was confirmed.  The patient was positioned supine.  She underwent deep sedation.  She received IV antibiotics.  Her neck and chest on both sides were prepped and draped in a sterile fashion.  Surgical time out confirming our plan.  Using ultrasound, I easily identified the right internal jugular vein. I performed venipuncture in the medial supraclavicular space.  I aspirated clear blood.  I advanced a wire through the needle. Fluoroscopy confirmed, it was in right ventricle.  I held it back until it was more in the right atrium.  I created an infraclavicular pocket more in the lateral clavicular line. I placed the Power Port and secured it to the anterior chest wall using interrupted Prolene stitches x4.  I tunneled it from the infraclavicular space to the neck venipuncture site.  Catheter flushed well.  I placed a dilator and a sheath in place.  The fluoroscopy confirmed the wire and the sheath were in correct location.  I pulled the wire  back from the SVC/RA junction up to the venipuncture brought in the neck using fluoroscopy to measure the intravenous length.  I cut the catheter to appropriate length.  We checked that the catheter and sheath were in appropriate spot.  I removed the wire and the dilator and placed the catheter within the sheath.  I peeled the sheath away.  Fluoroscopy noted that the tip was a little more down into the right atrium.  I pulled back a few centimeters out until I obtained more at the SVC/RA junction.  The catheter flushed quite easily.  I did a final flush.  I did some gentle dilation of the subcutaneous tissue to allow the catheter  to curve.  I did fluoroscopy to see there was no kinks or no sharp bends or turns of kinks in the catheter.  I closed the skin using Vicryl and Monocryl stitch.  Sterile dressing was applied.  The patient was sent to recovery room in stable condition.  Fluoroscopy had revealed no pneumothorax but I will get a chest x-ray for placement. Postoperative instructions have been discussed.  She is to see Dr. Truett Perna tomorrow.  She is happy to follow up with me p.r.n. as needed.     Ardeth Sportsman, MD     SCG/MEDQ  D:  05/16/2011  T:  05/16/2011  Job:  [id number]  Electronically Signed by Karie Soda MD on 05/18/2011 01:29:40 PM

## 2011-05-22 ENCOUNTER — Ambulatory Visit (HOSPITAL_COMMUNITY)
Admission: RE | Admit: 2011-05-22 | Discharge: 2011-05-22 | Disposition: A | Discharge status: Home / Self Care | Payer: Medicare HMO | Source: Ambulatory Visit | Attending: Oncology | Admitting: Oncology

## 2011-05-22 ENCOUNTER — Ambulatory Visit (INDEPENDENT_AMBULATORY_CARE_PROVIDER_SITE_OTHER): Payer: Medicare HMO | Admitting: Surgery

## 2011-05-22 ENCOUNTER — Telehealth (INDEPENDENT_AMBULATORY_CARE_PROVIDER_SITE_OTHER): Payer: Self-pay | Admitting: General Surgery

## 2011-05-22 ENCOUNTER — Other Ambulatory Visit: Payer: Self-pay | Admitting: Oncology

## 2011-05-22 ENCOUNTER — Encounter (INDEPENDENT_AMBULATORY_CARE_PROVIDER_SITE_OTHER): Payer: Self-pay | Admitting: Surgery

## 2011-05-22 VITALS — BP 126/80 | HR 80 | Temp 98.0°F | Ht 60.0 in | Wt 158.0 lb

## 2011-05-22 DIAGNOSIS — C180 Malignant neoplasm of cecum: Secondary | ICD-10-CM

## 2011-05-22 DIAGNOSIS — Z95828 Presence of other vascular implants and grafts: Secondary | ICD-10-CM

## 2011-05-22 DIAGNOSIS — T82598A Other mechanical complication of other cardiac and vascular devices and implants, initial encounter: Secondary | ICD-10-CM | POA: Insufficient documentation

## 2011-05-22 DIAGNOSIS — Y849 Medical procedure, unspecified as the cause of abnormal reaction of the patient, or of later complication, without mention of misadventure at the time of the procedure: Secondary | ICD-10-CM | POA: Insufficient documentation

## 2011-05-22 DIAGNOSIS — Z452 Encounter for adjustment and management of vascular access device: Secondary | ICD-10-CM | POA: Insufficient documentation

## 2011-05-22 DIAGNOSIS — J984 Other disorders of lung: Secondary | ICD-10-CM | POA: Insufficient documentation

## 2011-05-22 DIAGNOSIS — C787 Secondary malignant neoplasm of liver and intrahepatic bile duct: Secondary | ICD-10-CM | POA: Insufficient documentation

## 2011-05-22 DIAGNOSIS — E049 Nontoxic goiter, unspecified: Secondary | ICD-10-CM | POA: Insufficient documentation

## 2011-05-22 DIAGNOSIS — M4120 Other idiopathic scoliosis, site unspecified: Secondary | ICD-10-CM | POA: Insufficient documentation

## 2011-05-22 MED ORDER — IOHEXOL 300 MG/ML  SOLN: 2.0000 mL | Freq: Once | INTRAMUSCULAR | Status: DC | PRN

## 2011-05-22 NOTE — Progress Notes (Signed)
Reason for visit: Malpositioned portacatheter.  Patient comes a feeling well. They have not been able to get catheter to work. Her initial x-ray was concerned it was in the right atrium. After discussion with her oncologist, I recommended a venogram. They were not able to safely inject. They did a CT the chest. It looks like the catheter was running along the heart but not intravascular.  Based on that, I discussion with Dr. Truett Perna. I recommended interventional radiology placement. I offered to remove it. We found a time today.  She feels well. No fevers or sweats. A had some soreness at the incision that is calm down. No breathing. Bruising. She is breathing well. She can do her husband.  After obtaining informed consent, I sterilely prepped her right chest. I placed a local field block of anesthetic. She her to use some EMLA cream to help numb the skin. Went through her old incision in the infraclavicular pocket. I found the port. I removed the catheter out of the neck. I removed the port off the chest wall by freeing off the interrupted Prolene stitches x4. Hemostasis was excellent. I closed the wound using 4-0 Monocryl running subcuticular stitch. We placed a gauze and paper tape. She had some blistering with the prior Tegaderm.  She looked comfortable. We sent her home. I asked her that if she did not hear anything from Dr. Truett Perna for our office by tomorrow morning, please call our office to work on plans to get new placement of a portacatheter. Given her severe scoliosis and abnormal anatomy, I am hesitant to retry myself. I think interventional would be helpful. She already has had a port on the left side, and hesitant to try and go back on the left unless vascular feels otherwise. She her husband expressed appreciation agree with this plan.

## 2011-05-22 NOTE — Telephone Encounter (Signed)
Patient to come in today for port a cath removal in office per Dr Michaell Cowing. She agrees with this plan.

## 2011-05-24 ENCOUNTER — Other Ambulatory Visit: Payer: Self-pay | Admitting: Oncology

## 2011-05-24 DIAGNOSIS — C189 Malignant neoplasm of colon, unspecified: Secondary | ICD-10-CM

## 2011-05-30 ENCOUNTER — Other Ambulatory Visit: Payer: Self-pay | Admitting: Oncology

## 2011-05-30 ENCOUNTER — Ambulatory Visit (HOSPITAL_COMMUNITY)
Admission: RE | Admit: 2011-05-30 | Discharge: 2011-05-30 | Disposition: A | Discharge status: Home / Self Care | Payer: Medicare HMO | Source: Ambulatory Visit | Attending: Oncology | Admitting: Oncology

## 2011-05-30 DIAGNOSIS — Z79899 Other long term (current) drug therapy: Secondary | ICD-10-CM | POA: Insufficient documentation

## 2011-05-30 DIAGNOSIS — C189 Malignant neoplasm of colon, unspecified: Secondary | ICD-10-CM

## 2011-05-30 DIAGNOSIS — E7800 Pure hypercholesterolemia, unspecified: Secondary | ICD-10-CM | POA: Insufficient documentation

## 2011-05-30 DIAGNOSIS — Q059 Spina bifida, unspecified: Secondary | ICD-10-CM | POA: Insufficient documentation

## 2011-05-30 DIAGNOSIS — E039 Hypothyroidism, unspecified: Secondary | ICD-10-CM | POA: Insufficient documentation

## 2011-05-30 HISTORY — PX: PORTACATH PLACEMENT: SHX2246

## 2011-05-30 LAB — CBC
HCT: 38 % (ref 36.0–46.0)
MCH: 28.9 pg (ref 26.0–34.0)
MCHC: 32.1 g/dL (ref 30.0–36.0)
MCV: 90 fL (ref 78.0–100.0)
RDW: 13.5 % (ref 11.5–15.5)

## 2011-06-01 ENCOUNTER — Encounter (HOSPITAL_BASED_OUTPATIENT_CLINIC_OR_DEPARTMENT_OTHER): Payer: Medicare HMO | Admitting: Oncology

## 2011-06-01 DIAGNOSIS — Z5111 Encounter for antineoplastic chemotherapy: Secondary | ICD-10-CM

## 2011-06-01 DIAGNOSIS — C180 Malignant neoplasm of cecum: Secondary | ICD-10-CM

## 2011-06-03 ENCOUNTER — Encounter: Payer: Medicare HMO | Admitting: Oncology

## 2011-06-03 DIAGNOSIS — Z452 Encounter for adjustment and management of vascular access device: Secondary | ICD-10-CM

## 2011-06-14 ENCOUNTER — Encounter (HOSPITAL_BASED_OUTPATIENT_CLINIC_OR_DEPARTMENT_OTHER): Payer: Medicare HMO | Admitting: Oncology

## 2011-06-14 ENCOUNTER — Other Ambulatory Visit: Payer: Self-pay | Admitting: Oncology

## 2011-06-14 DIAGNOSIS — C180 Malignant neoplasm of cecum: Secondary | ICD-10-CM

## 2011-06-14 DIAGNOSIS — Z5111 Encounter for antineoplastic chemotherapy: Secondary | ICD-10-CM

## 2011-06-14 DIAGNOSIS — C787 Secondary malignant neoplasm of liver and intrahepatic bile duct: Secondary | ICD-10-CM

## 2011-06-14 DIAGNOSIS — E876 Hypokalemia: Secondary | ICD-10-CM

## 2011-06-14 LAB — CBC WITH DIFFERENTIAL/PLATELET
BASO%: 0.4 % (ref 0.0–2.0)
EOS%: 5.9 % (ref 0.0–7.0)
LYMPH%: 26.4 % (ref 14.0–49.7)
MCH: 28.8 pg (ref 25.1–34.0)
MCHC: 32.2 g/dL (ref 31.5–36.0)
MONO#: 0.5 10*3/uL (ref 0.1–0.9)
NEUT%: 60 % (ref 38.4–76.8)
RBC: 3.78 10*6/uL (ref 3.70–5.45)
WBC: 7.4 10*3/uL (ref 3.9–10.3)
lymph#: 1.9 10*3/uL (ref 0.9–3.3)

## 2011-06-14 LAB — COMPREHENSIVE METABOLIC PANEL
ALT: 36 U/L — ABNORMAL HIGH (ref 0–35)
AST: 41 U/L — ABNORMAL HIGH (ref 0–37)
Alkaline Phosphatase: 91 U/L (ref 39–117)
BUN: 10 mg/dL (ref 6–23)
Calcium: 9.6 mg/dL (ref 8.4–10.5)
Chloride: 100 mEq/L (ref 96–112)
Creatinine, Ser: <0.47 mg/dL — ABNORMAL LOW (ref 0.50–1.10)
Potassium: 4 mEq/L (ref 3.5–5.3)

## 2011-06-14 LAB — UA PROTEIN, DIPSTICK - CHCC: Protein, Urine: NEGATIVE mg/dL

## 2011-06-16 ENCOUNTER — Encounter (HOSPITAL_BASED_OUTPATIENT_CLINIC_OR_DEPARTMENT_OTHER): Payer: Medicare HMO | Admitting: Oncology

## 2011-06-16 DIAGNOSIS — C787 Secondary malignant neoplasm of liver and intrahepatic bile duct: Secondary | ICD-10-CM

## 2011-06-16 DIAGNOSIS — C180 Malignant neoplasm of cecum: Secondary | ICD-10-CM

## 2011-06-27 ENCOUNTER — Encounter (HOSPITAL_BASED_OUTPATIENT_CLINIC_OR_DEPARTMENT_OTHER): Payer: Medicare HMO | Admitting: Oncology

## 2011-06-27 ENCOUNTER — Other Ambulatory Visit: Payer: Self-pay | Admitting: Oncology

## 2011-06-27 DIAGNOSIS — Z452 Encounter for adjustment and management of vascular access device: Secondary | ICD-10-CM

## 2011-06-27 DIAGNOSIS — E876 Hypokalemia: Secondary | ICD-10-CM

## 2011-06-27 DIAGNOSIS — Z5111 Encounter for antineoplastic chemotherapy: Secondary | ICD-10-CM

## 2011-06-27 DIAGNOSIS — Z23 Encounter for immunization: Secondary | ICD-10-CM

## 2011-06-27 DIAGNOSIS — C787 Secondary malignant neoplasm of liver and intrahepatic bile duct: Secondary | ICD-10-CM

## 2011-06-27 DIAGNOSIS — C180 Malignant neoplasm of cecum: Secondary | ICD-10-CM

## 2011-06-27 LAB — CBC WITH DIFFERENTIAL/PLATELET
BASO%: 0.6 % (ref 0.0–2.0)
Basophils Absolute: 0 10*3/uL (ref 0.0–0.1)
EOS%: 7.4 % — ABNORMAL HIGH (ref 0.0–7.0)
HCT: 33.6 % — ABNORMAL LOW (ref 34.8–46.6)
HGB: 11.4 g/dL — ABNORMAL LOW (ref 11.6–15.9)
LYMPH%: 28 % (ref 14.0–49.7)
MCH: 30.3 pg (ref 25.1–34.0)
MCHC: 34 g/dL (ref 31.5–36.0)
MCV: 89.1 fL (ref 79.5–101.0)
MONO%: 7.8 % (ref 0.0–14.0)
NEUT%: 56.2 % (ref 38.4–76.8)

## 2011-06-27 LAB — COMPREHENSIVE METABOLIC PANEL
AST: 35 U/L (ref 0–37)
Albumin: 4.1 g/dL (ref 3.5–5.2)
Alkaline Phosphatase: 85 U/L (ref 39–117)
BUN: 11 mg/dL (ref 6–23)
Creatinine, Ser: 0.49 mg/dL — ABNORMAL LOW (ref 0.50–1.10)
Glucose, Bld: 77 mg/dL (ref 70–99)
Potassium: 4.3 mEq/L (ref 3.5–5.3)
Total Bilirubin: 0.3 mg/dL (ref 0.3–1.2)

## 2011-06-28 LAB — CBC
HCT: 36.9
Hemoglobin: 12.5
MCV: 90.6
RDW: 13.3

## 2011-06-28 LAB — BASIC METABOLIC PANEL
Chloride: 103
GFR calc non Af Amer: >60
Glucose, Bld: 67 — ABNORMAL LOW
Potassium: 4.4
Sodium: 139

## 2011-06-29 ENCOUNTER — Encounter (HOSPITAL_BASED_OUTPATIENT_CLINIC_OR_DEPARTMENT_OTHER): Payer: Medicare HMO | Admitting: Oncology

## 2011-06-29 DIAGNOSIS — C787 Secondary malignant neoplasm of liver and intrahepatic bile duct: Secondary | ICD-10-CM

## 2011-06-29 DIAGNOSIS — C180 Malignant neoplasm of cecum: Secondary | ICD-10-CM

## 2011-07-01 ENCOUNTER — Other Ambulatory Visit: Payer: Self-pay | Admitting: *Deleted

## 2011-07-01 DIAGNOSIS — C189 Malignant neoplasm of colon, unspecified: Secondary | ICD-10-CM

## 2011-07-11 ENCOUNTER — Encounter (HOSPITAL_BASED_OUTPATIENT_CLINIC_OR_DEPARTMENT_OTHER): Payer: Medicare HMO | Admitting: Oncology

## 2011-07-11 ENCOUNTER — Other Ambulatory Visit: Payer: Self-pay | Admitting: Hematology and Oncology

## 2011-07-11 DIAGNOSIS — E876 Hypokalemia: Secondary | ICD-10-CM

## 2011-07-11 DIAGNOSIS — Z5111 Encounter for antineoplastic chemotherapy: Secondary | ICD-10-CM

## 2011-07-11 DIAGNOSIS — C787 Secondary malignant neoplasm of liver and intrahepatic bile duct: Secondary | ICD-10-CM

## 2011-07-11 DIAGNOSIS — C180 Malignant neoplasm of cecum: Secondary | ICD-10-CM

## 2011-07-11 LAB — CBC WITH DIFFERENTIAL/PLATELET
Basophils Absolute: 0 10*3/uL (ref 0.0–0.1)
EOS%: 4.7 % (ref 0.0–7.0)
HCT: 37.2 % (ref 34.8–46.6)
HGB: 11.7 g/dL (ref 11.6–15.9)
MCH: 28.7 pg (ref 25.1–34.0)
MCHC: 31.5 g/dL (ref 31.5–36.0)
MCV: 91.2 fL (ref 79.5–101.0)
MONO%: 9 % (ref 0.0–14.0)
NEUT%: 60.1 % (ref 38.4–76.8)
RDW: 15.4 % — ABNORMAL HIGH (ref 11.2–14.5)

## 2011-07-11 LAB — COMPREHENSIVE METABOLIC PANEL
Alkaline Phosphatase: 96 U/L (ref 39–117)
BUN: 12 mg/dL (ref 6–23)
Glucose, Bld: 157 mg/dL — ABNORMAL HIGH (ref 70–99)
Total Bilirubin: 0.2 mg/dL — ABNORMAL LOW (ref 0.3–1.2)

## 2011-07-12 ENCOUNTER — Other Ambulatory Visit: Payer: Self-pay | Admitting: Internal Medicine

## 2011-07-13 ENCOUNTER — Encounter (HOSPITAL_BASED_OUTPATIENT_CLINIC_OR_DEPARTMENT_OTHER): Payer: Medicare HMO | Admitting: Oncology

## 2011-07-13 DIAGNOSIS — C180 Malignant neoplasm of cecum: Secondary | ICD-10-CM

## 2011-07-13 DIAGNOSIS — C787 Secondary malignant neoplasm of liver and intrahepatic bile duct: Secondary | ICD-10-CM

## 2011-07-18 NOTE — Progress Notes (Signed)
This encounter was created in error - please disregard.

## 2011-07-25 ENCOUNTER — Ambulatory Visit (HOSPITAL_BASED_OUTPATIENT_CLINIC_OR_DEPARTMENT_OTHER): Payer: Medicare HMO | Admitting: Nurse Practitioner

## 2011-07-25 ENCOUNTER — Other Ambulatory Visit (HOSPITAL_BASED_OUTPATIENT_CLINIC_OR_DEPARTMENT_OTHER): Payer: Medicare HMO | Admitting: Lab

## 2011-07-25 ENCOUNTER — Other Ambulatory Visit: Payer: Self-pay | Admitting: Oncology

## 2011-07-25 ENCOUNTER — Telehealth: Payer: Self-pay | Admitting: Oncology

## 2011-07-25 ENCOUNTER — Ambulatory Visit (HOSPITAL_BASED_OUTPATIENT_CLINIC_OR_DEPARTMENT_OTHER): Payer: Medicare HMO

## 2011-07-25 VITALS — BP 129/79 | HR 78 | Temp 97.2°F | Ht 60.0 in | Wt 157.2 lb

## 2011-07-25 VITALS — BP 121/69 | HR 75

## 2011-07-25 DIAGNOSIS — C772 Secondary and unspecified malignant neoplasm of intra-abdominal lymph nodes: Secondary | ICD-10-CM

## 2011-07-25 DIAGNOSIS — C787 Secondary malignant neoplasm of liver and intrahepatic bile duct: Secondary | ICD-10-CM

## 2011-07-25 DIAGNOSIS — C180 Malignant neoplasm of cecum: Secondary | ICD-10-CM

## 2011-07-25 DIAGNOSIS — Z5112 Encounter for antineoplastic immunotherapy: Secondary | ICD-10-CM

## 2011-07-25 DIAGNOSIS — C189 Malignant neoplasm of colon, unspecified: Secondary | ICD-10-CM

## 2011-07-25 DIAGNOSIS — Z5111 Encounter for antineoplastic chemotherapy: Secondary | ICD-10-CM

## 2011-07-25 LAB — UA PROTEIN, DIPSTICK - CHCC: Protein, Urine: NEGATIVE mg/dL

## 2011-07-25 LAB — CBC WITH DIFFERENTIAL/PLATELET
Eosinophils Absolute: 0.2 10*3/uL (ref 0.0–0.5)
MCV: 90.7 fL (ref 79.5–101.0)
MONO%: 8 % (ref 0.0–14.0)
NEUT#: 3.6 10*3/uL (ref 1.5–6.5)
RBC: 4.08 10*6/uL (ref 3.70–5.45)
RDW: 15.8 % — ABNORMAL HIGH (ref 11.2–14.5)
WBC: 5.8 10*3/uL (ref 3.9–10.3)

## 2011-07-25 LAB — COMPREHENSIVE METABOLIC PANEL
Albumin: 3.5 g/dL (ref 3.5–5.2)
BUN: 11 mg/dL (ref 6–23)
CO2: 28 mEq/L (ref 19–32)
Calcium: 10 mg/dL (ref 8.4–10.5)
Chloride: 100 mEq/L (ref 96–112)
Glucose, Bld: 133 mg/dL — ABNORMAL HIGH (ref 70–99)
Potassium: 3.9 mEq/L (ref 3.5–5.3)

## 2011-07-25 MED ORDER — IRINOTECAN HCL CHEMO INJECTION 100 MG/5ML
170.0000 mg/m2 | Freq: Once | INTRAVENOUS | Status: AC
  Administered 2011-07-25: 300 mg via INTRAVENOUS
  Filled 2011-07-25: qty 15

## 2011-07-25 MED ORDER — SODIUM CHLORIDE 0.9 % IV SOLN
Freq: Once | INTRAVENOUS | Status: AC
  Administered 2011-07-25: 14:00:00 via INTRAVENOUS

## 2011-07-25 MED ORDER — LEUCOVORIN CALCIUM INJECTION 350 MG
375.0000 mg/m2 | Freq: Once | INTRAVENOUS | Status: AC
  Administered 2011-07-25: 660 mg via INTRAVENOUS
  Filled 2011-07-25: qty 33

## 2011-07-25 MED ORDER — BEVACIZUMAB CHEMO INJECTION 400 MG/16ML
5.0000 mg/kg | Freq: Once | INTRAVENOUS | Status: AC
  Administered 2011-07-25: 375 mg via INTRAVENOUS
  Filled 2011-07-25: qty 15

## 2011-07-25 MED ORDER — SODIUM CHLORIDE 0.9 % IV SOLN: Freq: Once | INTRAVENOUS | Status: DC

## 2011-07-25 MED ORDER — PALONOSETRON HCL INJECTION 0.25 MG/5ML
0.2500 mg | Freq: Once | INTRAVENOUS | Status: AC
  Administered 2011-07-25: 0.25 mg via INTRAVENOUS

## 2011-07-25 MED ORDER — ATROPINE SULFATE 1 MG/ML IJ SOLN
0.5000 mg | Freq: Once | INTRAMUSCULAR | Status: AC | PRN
  Administered 2011-07-25: 0.5 mg via INTRAVENOUS

## 2011-07-25 MED ORDER — DEXAMETHASONE SODIUM PHOSPHATE 4 MG/ML IJ SOLN
20.0000 mg | Freq: Once | INTRAMUSCULAR | Status: AC
  Administered 2011-07-25: 20 mg via INTRAVENOUS

## 2011-07-25 MED ORDER — FLUOROURACIL CHEMO INJECTION 500 MG/10ML
250.0000 mg/m2 | Freq: Once | INTRAVENOUS | Status: AC
  Administered 2011-07-25: 450 mg via INTRAVENOUS
  Filled 2011-07-25: qty 9

## 2011-07-25 MED ORDER — SODIUM CHLORIDE 0.9 % IV SOLN
1475.0000 mg/m2 | INTRAVENOUS | Status: DC
  Administered 2011-07-25: 2600 mg via INTRAVENOUS
  Filled 2011-07-25: qty 52

## 2011-07-25 NOTE — Progress Notes (Signed)
OFFICE PROGRESS NOTE  Interval history:  April Spence is a 55 year old woman with metastatic colon cancer. She is on active treatment with FOLFIRI/Avastin. She completed cycle 4 on 07/11/2011. She is seen today for scheduled followup.  April Spence reports mild nausea following the most recent cycle of chemotherapy. She denies vomiting. Her mouth again became "tender". She did not develop ulcerations. Overall the diarrhea has been controlled with Lomotil. Yesterday however she had 3 large loose stools and "gas pain". She has had one loose stool so far today.  She denies shortness of breath. No chest pain. She notes a small amount of blood when she blows her nose. Leg swelling is stable.   Objective: Blood pressure 129/79, pulse 78, temperature 97.2 F (36.2 C), temperature source Oral, height 5' (1.524 m), weight 157 lb 3.2 oz (71.305 kg).  Oropharynx is without thrush or ulceration. Mucous membranes are pink and moist. Lungs are clear. No wheezes or rales. Regular cardiac rhythm. Port-A-Cath site is without erythema. Abdomen is soft and nontender. No hepatomegaly. Trace edema at the lower legs bilaterally.    Lab Results:  Hemoglobin 12.3 white count 5.8 absolute neutrophil count 3.6 platelet count 238,000   Studies/Results: No results found.  Medications: I have reviewed the patient's current medications.  Assessment/Plan: 1. 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  She completed 12 cycles of FOLFOX chemotherapy.  Oxaliplatin was held with cycles number 5, 8, 11, and 12. 2. Metastatic colon cancer confirmed on a restaging CT evaluation 05/02/2011 with liver metastases and periportal lymphadenopathy.  She began treatment with FOLFIRI on 06/01/2011.  Avastin was added beginning with cycle number 2. 3. Status post Port-A-Cath placement and Port-A-Cath removal by Dr.  Michaell Cowing.  A new Port-A-Cath was placed on 05/16/2011.  The Port-A-Cath was removed due to malposition.  A new Port-A-Cath was placed on 05/30/2011. 4. Spina bifida with severe scoliosis.   5. Chronic bilateral foot drop/lower leg and foot numbness. 6. Hypothyroidism. 7. History of chemotherapy-induced diarrhea.  The bolus and infusional 5-FU were reduced by 25% beginning with cycle number 2 of FOLFOX.  The 5-FU dose was decreased by an additional 10% beginning with cycle number 8. 8. History of delayed nausea following chemotherapy.   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  The oxaliplatin was dose reduced by 20% beginning with cycle number 6.  The neuropathy symptoms resolved. 10. History of neutropenia secondary to chemotherapy. 11. Status post a colonoscopy 05/05/2010.  There was a previous right colectomy anastomosis and slight nodularity.  Biopsies were taken which showed benign colonic mucosa with hyperplastic epithelial changes.  No significant inflammation, granuloma, or adenomatous epithelium was identified. 12. Diarrhea following FOLFIRI chemotherapy. Overall the diarrhea has been controlled with Lomotil. Yesterday she began experiencing increased diarrhea. This may be due to late phase diarrhea related to irinotecan. She will contact the office with poorly controlled diarrhea. 13. Probable mucositis related to 5-fluorouracil-she will contact the office with progressive symptoms. 14.   Fungal rash in the groin, improved with Diflucan and nystatin powder.   Disposition: Plan to proceed with cycle 5 FOLFIRI/Avastin today as scheduled. She will return for a followup visit and cycle 6 in 2 weeks. Restaging CT evaluation is planned following completion of 6 cycles. April Spence will contact the office prior to her next visit as outlined above or with any other problems.  Plan reviewed with Dr. Truett Perna.   April Spence ANP/GNP-BC

## 2011-07-25 NOTE — Progress Notes (Signed)
Post avastin BP 

## 2011-07-27 ENCOUNTER — Ambulatory Visit (HOSPITAL_BASED_OUTPATIENT_CLINIC_OR_DEPARTMENT_OTHER): Payer: Medicare HMO

## 2011-07-27 VITALS — BP 120/74 | HR 74 | Temp 98.1°F

## 2011-07-27 DIAGNOSIS — Z452 Encounter for adjustment and management of vascular access device: Secondary | ICD-10-CM

## 2011-07-27 DIAGNOSIS — C189 Malignant neoplasm of colon, unspecified: Secondary | ICD-10-CM

## 2011-07-27 DIAGNOSIS — C180 Malignant neoplasm of cecum: Secondary | ICD-10-CM

## 2011-07-27 DIAGNOSIS — C787 Secondary malignant neoplasm of liver and intrahepatic bile duct: Secondary | ICD-10-CM

## 2011-07-27 MED ORDER — HEPARIN SOD (PORK) LOCK FLUSH 100 UNIT/ML IV SOLN
500.0000 [IU] | Freq: Once | INTRAVENOUS | Status: AC | PRN
  Administered 2011-07-27: 500 [IU]
  Filled 2011-07-27: qty 5

## 2011-07-27 MED ORDER — SODIUM CHLORIDE 0.9 % IJ SOLN
10.0000 mL | INTRAMUSCULAR | Status: DC | PRN
Start: 2011-07-27 — End: 2011-07-27
  Administered 2011-07-27: 10 mL
  Filled 2011-07-27: qty 10

## 2011-07-27 NOTE — Patient Instructions (Signed)
Instructed patient to call with any problems.  Patient aware of next appointment 

## 2011-07-28 ENCOUNTER — Telehealth: Payer: Self-pay | Admitting: *Deleted

## 2011-07-28 DIAGNOSIS — C189 Malignant neoplasm of colon, unspecified: Secondary | ICD-10-CM

## 2011-07-28 MED ORDER — DIPHENOXYLATE-ATROPINE 2.5-0.025 MG PO TABS: ORAL_TABLET | ORAL | Status: DC

## 2011-07-28 NOTE — Telephone Encounter (Signed)
PRESCRIPTION REFILL REQUEST CALLED TO [facility]

## 2011-08-08 ENCOUNTER — Other Ambulatory Visit: Payer: Self-pay | Admitting: Oncology

## 2011-08-08 ENCOUNTER — Other Ambulatory Visit (HOSPITAL_BASED_OUTPATIENT_CLINIC_OR_DEPARTMENT_OTHER): Payer: Medicare HMO | Admitting: Lab

## 2011-08-08 ENCOUNTER — Ambulatory Visit (HOSPITAL_BASED_OUTPATIENT_CLINIC_OR_DEPARTMENT_OTHER): Payer: Medicare HMO

## 2011-08-08 ENCOUNTER — Ambulatory Visit (HOSPITAL_BASED_OUTPATIENT_CLINIC_OR_DEPARTMENT_OTHER): Payer: Medicare HMO | Admitting: Oncology

## 2011-08-08 VITALS — BP 112/72 | HR 75 | Temp 97.3°F | Ht 60.0 in | Wt 158.3 lb

## 2011-08-08 DIAGNOSIS — C189 Malignant neoplasm of colon, unspecified: Secondary | ICD-10-CM

## 2011-08-08 DIAGNOSIS — C180 Malignant neoplasm of cecum: Secondary | ICD-10-CM

## 2011-08-08 DIAGNOSIS — C787 Secondary malignant neoplasm of liver and intrahepatic bile duct: Secondary | ICD-10-CM

## 2011-08-08 DIAGNOSIS — Z5111 Encounter for antineoplastic chemotherapy: Secondary | ICD-10-CM

## 2011-08-08 DIAGNOSIS — E876 Hypokalemia: Secondary | ICD-10-CM

## 2011-08-08 LAB — COMPREHENSIVE METABOLIC PANEL
ALT: 33 U/L (ref 0–35)
AST: 35 U/L (ref 0–37)
Albumin: 3.8 g/dL (ref 3.5–5.2)
Alkaline Phosphatase: 72 U/L (ref 39–117)
BUN: 18 mg/dL (ref 6–23)
Calcium: 9.4 mg/dL (ref 8.4–10.5)
Chloride: 103 mEq/L (ref 96–112)
Potassium: 4 mEq/L (ref 3.5–5.3)
Total Protein: 7.2 g/dL (ref 6.0–8.3)

## 2011-08-08 LAB — CBC WITH DIFFERENTIAL/PLATELET
BASO%: 0.4 % (ref 0.0–2.0)
Eosinophils Absolute: 0.2 10*3/uL (ref 0.0–0.5)
LYMPH%: 24.4 % (ref 14.0–49.7)
MONO#: 0.6 10*3/uL (ref 0.1–0.9)
NEUT#: 4.4 10*3/uL (ref 1.5–6.5)
Platelets: 225 10*3/uL (ref 145–400)
RBC: 4.12 10*6/uL (ref 3.70–5.45)
WBC: 6.8 10*3/uL (ref 3.9–10.3)
lymph#: 1.7 10*3/uL (ref 0.9–3.3)

## 2011-08-08 LAB — UA PROTEIN, DIPSTICK - CHCC: Protein, Urine: NEGATIVE mg/dL

## 2011-08-08 MED ORDER — SODIUM CHLORIDE 0.9 % IV SOLN
1475.0000 mg/m2 | INTRAVENOUS | Status: DC
  Administered 2011-08-08: 2600 mg via INTRAVENOUS
  Filled 2011-08-08: qty 52

## 2011-08-08 MED ORDER — IRINOTECAN HCL CHEMO INJECTION 100 MG/5ML
170.0000 mg/m2 | Freq: Once | INTRAVENOUS | Status: AC
  Administered 2011-08-08: 300 mg via INTRAVENOUS
  Filled 2011-08-08: qty 15

## 2011-08-08 MED ORDER — SODIUM CHLORIDE 0.9 % IV SOLN
Freq: Once | INTRAVENOUS | Status: AC
Start: 2011-08-08 — End: 2011-08-08
  Administered 2011-08-08: 12:00:00 via INTRAVENOUS

## 2011-08-08 MED ORDER — FLUOROURACIL CHEMO INJECTION 500 MG/10ML
250.0000 mg/m2 | Freq: Once | INTRAVENOUS | Status: AC
  Administered 2011-08-08: 450 mg via INTRAVENOUS
  Filled 2011-08-08: qty 9

## 2011-08-08 MED ORDER — PALONOSETRON HCL INJECTION 0.25 MG/5ML
0.2500 mg | Freq: Once | INTRAVENOUS | Status: AC
  Administered 2011-08-08: 0.25 mg via INTRAVENOUS

## 2011-08-08 MED ORDER — ATROPINE SULFATE 1 MG/ML IJ SOLN
0.5000 mg | Freq: Once | INTRAMUSCULAR | Status: AC | PRN
  Administered 2011-08-08: 0.5 mg via INTRAVENOUS

## 2011-08-08 MED ORDER — DEXAMETHASONE SODIUM PHOSPHATE 4 MG/ML IJ SOLN
20.0000 mg | Freq: Once | INTRAMUSCULAR | Status: AC
  Administered 2011-08-08: 20 mg via INTRAVENOUS

## 2011-08-08 MED ORDER — SODIUM CHLORIDE 0.9 % IJ SOLN
10.0000 mL | INTRAMUSCULAR | Status: DC | PRN
  Filled 2011-08-08: qty 10

## 2011-08-08 MED ORDER — SODIUM CHLORIDE 0.9 % IV SOLN
5.0000 mg/kg | Freq: Once | INTRAVENOUS | Status: AC
  Administered 2011-08-08: 375 mg via INTRAVENOUS
  Filled 2011-08-08: qty 15

## 2011-08-08 MED ORDER — LEUCOVORIN CALCIUM INJECTION 350 MG
375.0000 mg/m2 | Freq: Once | INTRAVENOUS | Status: AC
  Administered 2011-08-08: 660 mg via INTRAVENOUS
  Filled 2011-08-08: qty 33

## 2011-08-08 NOTE — Progress Notes (Signed)
OFFICE PROGRESS NOTE  Interval history:  April Spence is a 55 year old woman with metastatic colon cancer. She is on active treatment with FOLFIRI/Avastin. She completed cycle 5 on 07/25/2011. She is seen today for scheduled followup.  [name] reports mild nausea following the most recent cycle of chemotherapy. She denies vomiting. Her mouth again became "tender". She did not develop ulcerations. Overall the diarrhea has been controlled with Lomotil.    She denies shortness of breath. No chest pain. Leg swelling is stable.   Objective: Blood pressure 112/72, pulse 75, temperature 97.3 F (36.3 C), temperature source Oral, height 5' (1.524 m), weight 158 lb 4.8 oz (71.804 kg).  Oropharynx is without thrush or ulceration. Mucous membranes are pink and moist. Lungs-inspiratory rhonchi at the low posterior chest bilaterally. Regular cardiac rhythm. Port-A-Cath site is without erythema. Abdomen is soft, mild tenderness in the left abdomen.. No hepatomegaly. Trace edema at the lower legs bilaterally.    Lab Results:  Hemoglobin 12.3 white count 5.8 absolute neutrophil count 3.6 platelet count 238,000   Studies/Results: No results found.  Medications: I have reviewed the patient's current medications.  Assessment/Plan: 1. 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  She completed 12 cycles of FOLFOX chemotherapy.  Oxaliplatin was held with cycles number 5, 8, 11, and 12. 2. Metastatic colon cancer confirmed on a restaging CT evaluation 05/02/2011 with liver metastases and periportal lymphadenopathy.  She began treatment with FOLFIRI on 06/01/2011.  Avastin was added beginning with cycle number 2. 3. Status post Port-A-Cath placement and Port-A-Cath removal by Dr. Michaell Cowing.  A new Port-A-Cath was placed on 05/16/2011.  The Port-A-Cath was removed due to malposition.  A new  Port-A-Cath was placed on 05/30/2011. 4. Spina bifida with severe scoliosis.   5. Chronic bilateral foot drop/lower leg and foot numbness. 6. Hypothyroidism. 7. History of chemotherapy-induced diarrhea.  The bolus and infusional 5-FU were reduced by 25% beginning with cycle number 2 of FOLFOX.  The 5-FU dose was decreased by an additional 10% beginning with cycle number 8. 8. History of delayed nausea following chemotherapy.   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  The oxaliplatin was dose reduced by 20% beginning with cycle number 6.  The neuropathy symptoms resolved. 10. History of neutropenia secondary to chemotherapy. 11. Status post a colonoscopy 05/05/2010.  There was a previous right colectomy anastomosis and slight nodularity.  Biopsies were taken which showed benign colonic mucosa with hyperplastic epithelial changes.  No significant inflammation, granuloma, or adenomatous epithelium was identified. 12. Diarrhea following FOLFIRI chemotherapy. Overall the diarrhea has been controlled with Lomotil.  13. Probable mucositis related to 5-fluorouracil-she will contact the office with progressive symptoms. 14.   Fungal rash in the groin, improved with Diflucan and nystatin powder.   Disposition: Plan to proceed with cycle 6 FOLFIRI/Avastin today as scheduled. She will return for a followup visit and cycle 7 in 2 weeks. Restaging CT evaluation is planned following this cycle.     April Spence BRADLEY ANP/GNP-BC

## 2011-08-10 ENCOUNTER — Ambulatory Visit (HOSPITAL_BASED_OUTPATIENT_CLINIC_OR_DEPARTMENT_OTHER): Payer: Medicare HMO

## 2011-08-10 VITALS — BP 137/84 | HR 73 | Temp 98.2°F

## 2011-08-10 DIAGNOSIS — C180 Malignant neoplasm of cecum: Secondary | ICD-10-CM

## 2011-08-10 DIAGNOSIS — C189 Malignant neoplasm of colon, unspecified: Secondary | ICD-10-CM

## 2011-08-10 DIAGNOSIS — C787 Secondary malignant neoplasm of liver and intrahepatic bile duct: Secondary | ICD-10-CM

## 2011-08-10 MED ORDER — HEPARIN SOD (PORK) LOCK FLUSH 100 UNIT/ML IV SOLN
500.0000 [IU] | Freq: Once | INTRAVENOUS | Status: AC | PRN
  Administered 2011-08-10: 500 [IU]
  Filled 2011-08-10: qty 5

## 2011-08-10 MED ORDER — SODIUM CHLORIDE 0.9 % IJ SOLN
10.0000 mL | INTRAMUSCULAR | Status: DC | PRN
  Administered 2011-08-10: 10 mL
  Filled 2011-08-10: qty 10

## 2011-08-14 ENCOUNTER — Telehealth: Payer: Self-pay

## 2011-08-14 DIAGNOSIS — C189 Malignant neoplasm of colon, unspecified: Secondary | ICD-10-CM

## 2011-08-14 MED ORDER — ONDANSETRON HCL 8 MG PO TABS: 8.0000 mg | ORAL_TABLET | Freq: Two times a day (BID) | ORAL | Status: AC | PRN

## 2011-08-14 MED ORDER — FLUCONAZOLE 100 MG PO TABS: 100.0000 mg | ORAL_TABLET | Freq: Every day | ORAL | Status: AC

## 2011-08-14 NOTE — Telephone Encounter (Signed)
Received call from pt requesting script for "thrush in my mouth."  Pt reports her tongue being "coated" with "white film" since Thursday.  Denies redness or open sores to gums.  Reports altered taste and "burning when I try to eat."  Pt is trying to push po fluids.    Pt also requests "something stronger" for nausea.  Pt is concerned about drinking contrast for CT tomorrow "since I have nausea most of the time."  Pt has only Compazine at home.  Note to Dr Truett Perna.  dph

## 2011-08-14 NOTE — Telephone Encounter (Signed)
Diflucan & Zofran e-prescribed per Dr Truett Perna.  Pt notified by phone.   dph

## 2011-08-15 ENCOUNTER — Ambulatory Visit (HOSPITAL_COMMUNITY)
Admission: RE | Admit: 2011-08-15 | Discharge: 2011-08-15 | Disposition: A | Discharge status: Home / Self Care | Payer: Medicare HMO | Source: Ambulatory Visit | Attending: Nurse Practitioner | Admitting: Nurse Practitioner

## 2011-08-15 ENCOUNTER — Encounter: Payer: Self-pay | Admitting: *Deleted

## 2011-08-15 ENCOUNTER — Other Ambulatory Visit: Payer: Self-pay | Admitting: *Deleted

## 2011-08-15 DIAGNOSIS — C189 Malignant neoplasm of colon, unspecified: Secondary | ICD-10-CM

## 2011-08-15 DIAGNOSIS — D259 Leiomyoma of uterus, unspecified: Secondary | ICD-10-CM | POA: Insufficient documentation

## 2011-08-15 DIAGNOSIS — C787 Secondary malignant neoplasm of liver and intrahepatic bile duct: Secondary | ICD-10-CM | POA: Insufficient documentation

## 2011-08-15 DIAGNOSIS — R599 Enlarged lymph nodes, unspecified: Secondary | ICD-10-CM | POA: Insufficient documentation

## 2011-08-15 DIAGNOSIS — M47814 Spondylosis without myelopathy or radiculopathy, thoracic region: Secondary | ICD-10-CM | POA: Insufficient documentation

## 2011-08-15 MED ORDER — IOHEXOL 300 MG/ML  SOLN
100.0000 mL | Freq: Once | INTRAMUSCULAR | Status: AC | PRN
  Administered 2011-08-15: 100 mL via INTRAVENOUS

## 2011-08-15 MED ORDER — DIPHENOXYLATE-ATROPINE 2.5-0.025 MG PO TABS: ORAL_TABLET | ORAL | Status: DC

## 2011-08-15 NOTE — Progress Notes (Signed)
Generic lomotil refill request received from [facility] on [address].  Request to MD for review.

## 2011-08-16 ENCOUNTER — Telehealth: Payer: Self-pay | Admitting: *Deleted

## 2011-08-16 NOTE — Telephone Encounter (Signed)
Patient called for results of CT scan done 08/15/11--next appointment is 08/22/11.

## 2011-08-17 ENCOUNTER — Telehealth: Payer: Self-pay | Admitting: *Deleted

## 2011-08-17 NOTE — Telephone Encounter (Signed)
Call from pt requesting CT results. Reviewed by Dr. Truett Perna: CT looks better, liver lesions are smaller. Pt verbalized understanding. 12/11 appt confirmed.

## 2011-08-20 ENCOUNTER — Other Ambulatory Visit: Payer: Self-pay | Admitting: Oncology

## 2011-08-22 ENCOUNTER — Ambulatory Visit (HOSPITAL_BASED_OUTPATIENT_CLINIC_OR_DEPARTMENT_OTHER): Payer: Medicare HMO

## 2011-08-22 ENCOUNTER — Other Ambulatory Visit (HOSPITAL_BASED_OUTPATIENT_CLINIC_OR_DEPARTMENT_OTHER): Payer: Medicare HMO | Admitting: Lab

## 2011-08-22 ENCOUNTER — Ambulatory Visit (HOSPITAL_BASED_OUTPATIENT_CLINIC_OR_DEPARTMENT_OTHER): Payer: Medicare HMO | Admitting: Nurse Practitioner

## 2011-08-22 VITALS — BP 116/77 | HR 74 | Temp 97.9°F | Ht 60.0 in | Wt 157.6 lb

## 2011-08-22 DIAGNOSIS — C180 Malignant neoplasm of cecum: Secondary | ICD-10-CM

## 2011-08-22 DIAGNOSIS — R599 Enlarged lymph nodes, unspecified: Secondary | ICD-10-CM

## 2011-08-22 DIAGNOSIS — C189 Malignant neoplasm of colon, unspecified: Secondary | ICD-10-CM

## 2011-08-22 DIAGNOSIS — Z5112 Encounter for antineoplastic immunotherapy: Secondary | ICD-10-CM

## 2011-08-22 DIAGNOSIS — Z5111 Encounter for antineoplastic chemotherapy: Secondary | ICD-10-CM

## 2011-08-22 DIAGNOSIS — C787 Secondary malignant neoplasm of liver and intrahepatic bile duct: Secondary | ICD-10-CM

## 2011-08-22 LAB — COMPREHENSIVE METABOLIC PANEL
Albumin: 3.8 g/dL (ref 3.5–5.2)
Alkaline Phosphatase: 75 U/L (ref 39–117)
BUN: 12 mg/dL (ref 6–23)
CO2: 21 mEq/L (ref 19–32)
Calcium: 9.2 mg/dL (ref 8.4–10.5)
Chloride: 103 mEq/L (ref 96–112)
Glucose, Bld: 147 mg/dL — ABNORMAL HIGH (ref 70–99)
Potassium: 4 mEq/L (ref 3.5–5.3)
Sodium: 134 mEq/L — ABNORMAL LOW (ref 135–145)
Total Protein: 7.2 g/dL (ref 6.0–8.3)

## 2011-08-22 LAB — CBC WITH DIFFERENTIAL/PLATELET
EOS%: 1.9 % (ref 0.0–7.0)
Eosinophils Absolute: 0.1 10*3/uL (ref 0.0–0.5)
MCV: 90.6 fL (ref 79.5–101.0)
MONO%: 8.8 % (ref 0.0–14.0)
NEUT#: 5 10*3/uL (ref 1.5–6.5)
RBC: 4.12 10*6/uL (ref 3.70–5.45)
RDW: 16.9 % — ABNORMAL HIGH (ref 11.2–14.5)
WBC: 7.7 10*3/uL (ref 3.9–10.3)
lymph#: 1.9 10*3/uL (ref 0.9–3.3)

## 2011-08-22 MED ORDER — ATROPINE SULFATE 1 MG/ML IJ SOLN
0.5000 mg | Freq: Once | INTRAMUSCULAR | Status: DC | PRN
Start: 2011-08-22 — End: 2011-08-22

## 2011-08-22 MED ORDER — SODIUM CHLORIDE 0.9 % IV SOLN: Freq: Once | INTRAVENOUS | Status: DC

## 2011-08-22 MED ORDER — SODIUM CHLORIDE 0.9 % IV SOLN
1475.0000 mg/m2 | INTRAVENOUS | Status: DC
  Administered 2011-08-22: 2600 mg via INTRAVENOUS
  Filled 2011-08-22: qty 52

## 2011-08-22 MED ORDER — PALONOSETRON HCL INJECTION 0.25 MG/5ML
0.2500 mg | Freq: Once | INTRAVENOUS | Status: AC
  Administered 2011-08-22: 0.25 mg via INTRAVENOUS

## 2011-08-22 MED ORDER — SODIUM CHLORIDE 0.9 % IV SOLN
5.0000 mg/kg | Freq: Once | INTRAVENOUS | Status: AC
  Administered 2011-08-22: 375 mg via INTRAVENOUS
  Filled 2011-08-22: qty 15

## 2011-08-22 MED ORDER — FLUOROURACIL CHEMO INJECTION 500 MG/10ML
250.0000 mg/m2 | Freq: Once | INTRAVENOUS | Status: AC
  Administered 2011-08-22: 450 mg via INTRAVENOUS
  Filled 2011-08-22: qty 9

## 2011-08-22 MED ORDER — DEXAMETHASONE SODIUM PHOSPHATE 4 MG/ML IJ SOLN
20.0000 mg | Freq: Once | INTRAMUSCULAR | Status: AC
  Administered 2011-08-22: 20 mg via INTRAVENOUS

## 2011-08-22 MED ORDER — LEUCOVORIN CALCIUM INJECTION 350 MG
375.0000 mg/m2 | Freq: Once | INTRAVENOUS | Status: AC
  Administered 2011-08-22: 660 mg via INTRAVENOUS
  Filled 2011-08-22: qty 33

## 2011-08-22 MED ORDER — DEXTROSE 5 % IV SOLN
170.0000 mg/m2 | Freq: Once | INTRAVENOUS | Status: AC
  Administered 2011-08-22: 300 mg via INTRAVENOUS
  Filled 2011-08-22: qty 15

## 2011-08-22 NOTE — Progress Notes (Signed)
OFFICE PROGRESS NOTE  Interval history:  April Spence is a 55 year old woman with metastatic colon cancer. She is currently being treated with FOLFIRI/Avastin. She completed cycle 6 08/08/2011. Restaging CT evaluation 08/15/2011 showed decrease in the size of liver metastases, decreased porta hepatis lymphadenopathy and no new or progressive disease within the abdomen or pelvis. She is seen today for scheduled followup.  April Spence reports developing mouth ulcers following the most recent cycle of chemotherapy. She was treated for thrush. The mouth ulcers resolved following a course of Diflucan. Diarrhea "comes and goes". She notes increased diarrhea for several days after each chemotherapy. The diarrhea is controlled with Imodium. She denies nausea or vomiting. No hand or foot pain or redness. She continues to note a small amount of blood when she blows her nose. Otherwise no bleeding. She denies shortness of breath or chest pain. No change in chronic leg edema.   Objective: Blood pressure 116/77, pulse 74, temperature 97.9 F (36.6 C), temperature source Oral, height 5' (1.524 m), weight 157 lb 9.6 oz (71.487 kg).  Oropharynx is without thrush or ulceration. Mucous membranes are pink and moist. Lungs are clear. No wheezes or rales. Regular cardiac rhythm. Port-A-Cath site is without erythema. Abdomen is soft and nontender. Trace lower leg edema bilaterally.   Lab Results: Lab Results  Component Value Date   WBC 7.7 08/22/2011   HGB 12.8 08/22/2011   HCT 37.4 08/22/2011   MCV 90.6 08/22/2011   PLT 247 08/22/2011    Chemistry:      Component Value Date/Time   NA 138 08/08/2011 1024   NA 136 05/02/2011 0954   K 4.0 08/08/2011 1024   K 4.5 05/02/2011 0954   CL 103 08/08/2011 1024   CL 93* 05/02/2011 0954   CO2 24 08/08/2011 1024   CO2 29 05/02/2011 0954   GLUCOSE 155* 08/08/2011 1024   GLUCOSE 119* 05/02/2011 0954   BUN 18 08/08/2011 1024   BUN 12 05/02/2011 0954   CREATININE 0.55  08/08/2011 1024   CREATININE 0.5* 05/02/2011 0954   CALCIUM 9.4 08/08/2011 1024   CALCIUM 9.3 05/02/2011 0954   PROT 7.2 08/08/2011 1024   PROT 8.1 05/02/2011 0954   ALBUMIN 3.8 08/08/2011 1024   AST 35 08/08/2011 1024   AST 31 05/02/2011 0954   ALT 33 08/08/2011 1024   ALKPHOS 72 08/08/2011 1024   ALKPHOS 78 05/02/2011 0954   BILITOT 0.3 08/08/2011 1024   BILITOT 0.40 05/02/2011 0954   GFRNONAA NOT CALCULATED 05/12/2011 1106   GFRAA NOT CALCULATED 05/12/2011 1106     Studies/Results: Ct Abdomen Pelvis W Contrast  08/15/2011  *RADIOLOGY REPORT*  Clinical Data: Follow-up metastatic colon carcinoma.  CT ABDOMEN AND PELVIS WITH CONTRAST  Technique:  Multidetector CT imaging of the abdomen and pelvis was performed following the standard protocol during bolus administration of intravenous contrast.  Contrast: OMNIPAQUE IOHEXOL 300 MG/ML IV SOLN  Comparison: 05/02/2011  Findings: Decrease in size of hypovascular mass is seen in the right hepatic lobe which now measures 2.9 x 3.8 cm compared to 3.5 x 4.8 cm on prior exam. Altered hepatic perfusion and mild dilatation of intrahepatic bile ducts is again seen peripheral to this right hepatic lobe mass.  Other liver mass in the central left hepatic lobe adjacent to the left portal vein has also decreased in size, now measuring 1.3 x 2.1 cm compared to 3.0 x 3.8 cm on prior exam. Diffuse hepatic steatosis is again demonstrated.  No new or enlarging liver  masses are identified.  Decreased lymphadenopathy is seen in the porta hepatis since prior exam, with largest lymph node now measuring 1.6 cm in short axis compared to 2.3 cm on prior exam.  No other areas of lymphadenopathy identified within the abdomen or pelvis.  The other abdominal parenchymal organs are normal in appearance.  No evidence of hydronephrosis.  Small uterine fibroids again seen, largest measuring 3 cm, which are stable.  No other soft tissue masses identified.  No evidence of inflammatory  process or ascites.  No evidence of bowel wall thickening or dilatation.  No suspicious bone lesions are identified.  Severe thoracolumbar scoliosis and spondylosis again noted.  IMPRESSION:  1.  Decrease in size of liver metastases since prior study. 2.  Decreased porta hepatis lymphadenopathy. 3.  No new or progressive disease within the abdomen or pelvis. 4.  Stable small uterine fibroids.  Original Report Authenticated By: Danae Orleans, M.D.    Medications: I have reviewed the patient's current medications.  Assessment/Plan:  1. 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 She completed 12 cycles of FOLFOX chemotherapy. Oxaliplatin was held with cycles number 5, 8, 11, and 12. 2. Metastatic colon cancer confirmed on a restaging CT evaluation 05/02/2011 with liver metastases and periportal lymphadenopathy. She began treatment with FOLFIRI on 06/01/2011. Avastin was added beginning with cycle number 2. She completed cycle #6 08/08/2011. Restaging CT evaluation 08/15/2011 showed improvement in the liver metastases and porta hepatis lymphadenopathy. 3. Status post Port-A-Cath placement and Port-A-Cath removal by Dr. Michaell Cowing. A new Port-A-Cath was placed on 05/16/2011. The Port-A-Cath was removed due to malposition. A new Port-A-Cath was placed on 05/30/2011. 4. Spina bifida with severe scoliosis.  5. Chronic bilateral foot drop/lower leg and foot numbness. 6. Hypothyroidism. 7. History of chemotherapy-induced diarrhea. The bolus and infusional 5-FU were reduced by 25% beginning with cycle number 2 of FOLFOX. The 5-FU dose was decreased by an additional 10% beginning with cycle number 8. 8. History of delayed nausea following chemotherapy.  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 The oxaliplatin was dose reduced by 20% beginning with cycle number 6. The neuropathy  symptoms resolved. 10. History of neutropenia secondary to chemotherapy. 11. Status post a colonoscopy 05/05/2010. There was a previous right colectomy anastomosis and slight nodularity. Biopsies were taken which showed benign colonic mucosa with hyperplastic epithelial changes. No significant inflammation, granuloma, or adenomatous epithelium was identified. 12. Diarrhea following FOLFIRI chemotherapy. Overall the diarrhea has been controlled with Lomotil.  13. Probable mucositis related to 5-fluorouracil-she will contact the office with progressive symptoms.       14. Fungal rash in the groin, improved with Diflucan and nystatin powder.       15.  Mouth ulcers, oral candidiasis following cycle 6 of FOLFIRI-the ulcers resolved following treatment with Diflucan.  Disposition-April Spence appears stable. Plan to proceed with cycle 7 of FOLFIRI/Avastin today as scheduled. She will return for a followup visit and cycle 8 on 09/13/2011. She will contact the office in the interim with any problems.  Plan reviewed with Dr. Truett Perna.     Lonna Cobb ANP/GNP-BC

## 2011-08-24 ENCOUNTER — Ambulatory Visit (HOSPITAL_BASED_OUTPATIENT_CLINIC_OR_DEPARTMENT_OTHER): Payer: Medicare HMO

## 2011-08-24 VITALS — BP 133/83 | HR 70 | Temp 98.4°F

## 2011-08-24 DIAGNOSIS — Z469 Encounter for fitting and adjustment of unspecified device: Secondary | ICD-10-CM

## 2011-08-24 DIAGNOSIS — C189 Malignant neoplasm of colon, unspecified: Secondary | ICD-10-CM

## 2011-08-24 MED ORDER — HEPARIN SOD (PORK) LOCK FLUSH 100 UNIT/ML IV SOLN
500.0000 [IU] | Freq: Once | INTRAVENOUS | Status: AC | PRN
  Administered 2011-08-24: 500 [IU]
  Filled 2011-08-24: qty 5

## 2011-08-24 MED ORDER — SODIUM CHLORIDE 0.9 % IJ SOLN
10.0000 mL | INTRAMUSCULAR | Status: DC | PRN
  Administered 2011-08-24: 10 mL
  Filled 2011-08-24: qty 10

## 2011-08-24 NOTE — Patient Instructions (Signed)
Call MD for problems 

## 2011-09-10 ENCOUNTER — Other Ambulatory Visit: Payer: Self-pay | Admitting: Oncology

## 2011-09-11 ENCOUNTER — Other Ambulatory Visit: Payer: Self-pay | Admitting: *Deleted

## 2011-09-11 DIAGNOSIS — C189 Malignant neoplasm of colon, unspecified: Secondary | ICD-10-CM

## 2011-09-11 MED ORDER — DIPHENOXYLATE-ATROPINE 2.5-0.025 MG PO TABS: ORAL_TABLET | ORAL | Status: DC

## 2011-09-13 ENCOUNTER — Ambulatory Visit (HOSPITAL_BASED_OUTPATIENT_CLINIC_OR_DEPARTMENT_OTHER): Payer: Medicare HMO

## 2011-09-13 ENCOUNTER — Other Ambulatory Visit: Payer: Medicare HMO | Admitting: Lab

## 2011-09-13 ENCOUNTER — Ambulatory Visit (HOSPITAL_BASED_OUTPATIENT_CLINIC_OR_DEPARTMENT_OTHER): Payer: Medicare HMO | Admitting: Nurse Practitioner

## 2011-09-13 ENCOUNTER — Other Ambulatory Visit: Payer: Self-pay | Admitting: Certified Registered Nurse Anesthetist

## 2011-09-13 VITALS — BP 121/75 | HR 70 | Temp 98.6°F | Ht 60.0 in | Wt 157.0 lb

## 2011-09-13 DIAGNOSIS — C189 Malignant neoplasm of colon, unspecified: Secondary | ICD-10-CM

## 2011-09-13 DIAGNOSIS — Z5112 Encounter for antineoplastic immunotherapy: Secondary | ICD-10-CM

## 2011-09-13 LAB — UA PROTEIN, DIPSTICK - CHCC: Protein, Urine: NEGATIVE mg/dL

## 2011-09-13 LAB — COMPREHENSIVE METABOLIC PANEL
AST: 35 U/L (ref 0–37)
Albumin: 3.3 g/dL — ABNORMAL LOW (ref 3.5–5.2)
Alkaline Phosphatase: 71 U/L (ref 39–117)
Chloride: 103 mEq/L (ref 96–112)
Glucose, Bld: 105 mg/dL — ABNORMAL HIGH (ref 70–99)
Potassium: 3.8 mEq/L (ref 3.5–5.3)
Sodium: 139 mEq/L (ref 135–145)
Total Protein: 7.1 g/dL (ref 6.0–8.3)

## 2011-09-13 LAB — CBC WITH DIFFERENTIAL/PLATELET
BASO%: 0.8 % (ref 0.0–2.0)
EOS%: 3.1 % (ref 0.0–7.0)
MCH: 30.2 pg (ref 25.1–34.0)
MCV: 92 fL (ref 79.5–101.0)
MONO%: 10.6 % (ref 0.0–14.0)
RBC: 4.09 10*6/uL (ref 3.70–5.45)
RDW: 17.3 % — ABNORMAL HIGH (ref 11.2–14.5)

## 2011-09-13 MED ORDER — SODIUM CHLORIDE 0.9 % IJ SOLN
10.0000 mL | INTRAMUSCULAR | Status: DC | PRN
  Filled 2011-09-13: qty 10

## 2011-09-13 MED ORDER — PALONOSETRON HCL INJECTION 0.25 MG/5ML
0.2500 mg | Freq: Once | INTRAVENOUS | Status: AC
  Administered 2011-09-13: 0.25 mg via INTRAVENOUS

## 2011-09-13 MED ORDER — DEXAMETHASONE SODIUM PHOSPHATE 4 MG/ML IJ SOLN
20.0000 mg | Freq: Once | INTRAMUSCULAR | Status: AC
  Administered 2011-09-13: 20 mg via INTRAVENOUS

## 2011-09-13 MED ORDER — IRINOTECAN HCL CHEMO INJECTION 100 MG/5ML
170.0000 mg/m2 | Freq: Once | INTRAVENOUS | Status: AC
  Administered 2011-09-13: 300 mg via INTRAVENOUS
  Filled 2011-09-13: qty 15

## 2011-09-13 MED ORDER — SODIUM CHLORIDE 0.9 % IV SOLN
5.0000 mg/kg | Freq: Once | INTRAVENOUS | Status: AC
  Administered 2011-09-13: 375 mg via INTRAVENOUS
  Filled 2011-09-13: qty 15

## 2011-09-13 MED ORDER — FLUOROURACIL CHEMO INJECTION 500 MG/10ML
250.0000 mg/m2 | Freq: Once | INTRAVENOUS | Status: AC
  Administered 2011-09-13: 450 mg via INTRAVENOUS
  Filled 2011-09-13: qty 9

## 2011-09-13 MED ORDER — SODIUM CHLORIDE 0.9 % IV SOLN: Freq: Once | INTRAVENOUS | Status: DC

## 2011-09-13 MED ORDER — SODIUM CHLORIDE 0.9 % IV SOLN
1475.0000 mg/m2 | INTRAVENOUS | Status: DC
  Administered 2011-09-13: 2600 mg via INTRAVENOUS
  Filled 2011-09-13: qty 52

## 2011-09-13 MED ORDER — ATROPINE SULFATE 1 MG/ML IJ SOLN
0.5000 mg | Freq: Once | INTRAMUSCULAR | Status: AC | PRN
  Administered 2011-09-13: 0.5 mg via INTRAVENOUS

## 2011-09-13 MED ORDER — HEPARIN SOD (PORK) LOCK FLUSH 100 UNIT/ML IV SOLN
500.0000 [IU] | Freq: Once | INTRAVENOUS | Status: DC | PRN
  Filled 2011-09-13: qty 5

## 2011-09-13 MED ORDER — HEPARIN SOD (PORK) LOCK FLUSH 100 UNIT/ML IV SOLN
500.0000 [IU] | Freq: Once | INTRAVENOUS | Status: DC
  Filled 2011-09-13: qty 5

## 2011-09-13 MED ORDER — LEUCOVORIN CALCIUM INJECTION 350 MG
375.0000 mg/m2 | Freq: Once | INTRAVENOUS | Status: AC
  Administered 2011-09-13: 660 mg via INTRAVENOUS
  Filled 2011-09-13: qty 33

## 2011-09-13 NOTE — Patient Instructions (Signed)
Pt. D/c'd to home with husband @ 1415 with home infusion pump with 5-fu infusing over 46h, tol rx well, no c/o's, pt due to return fri @ 1430 for pump d/c, port site unremarkable.

## 2011-09-13 NOTE — Progress Notes (Signed)
OFFICE PROGRESS NOTE  Interval history:  April Spence is a 56 year old woman with metastatic colon cancer. She is currently being treated with FOLFIRI/Avastin. She completed cycle 7 08/22/2011. She is seen today for scheduled followup.  Ms. Keleher reports developing a single mouth ulcer following the most recent cycle of chemotherapy. The mouth ulcer has resolved. She noted increased diarrhea following the most recent cycle of chemotherapy. She takes Lomotil as needed. She has mild intermittent nausea. No vomiting. No hand or foot pain or redness. She continues to note a small amount of blood when she blows her nose. No other bleeding. She denies shortness of breath or chest pain. No change in chronic leg edema.   Objective: Blood pressure 121/75, pulse 70, temperature 98.6 F (37 C), temperature source Oral, height 5' (1.524 m), weight 157 lb (71.215 kg).  Oropharynx is without thrush or ulceration. Mucous membranes are pink and moist. Lungs are clear. No wheezes or rales. Regular cardiac rhythm. Port-A-Cath site is without erythema. Abdomen is soft and nontender. Trace lower leg edema bilaterally.   Lab Results: Lab Results  Component Value Date   WBC 5.9 09/13/2011   HGB 12.4 09/13/2011   HCT 37.7 09/13/2011   MCV 92.0 09/13/2011   PLT 237 09/13/2011    Chemistry:      Component Value Date/Time   NA 139 09/13/2011 1119   NA 136 05/02/2011 0954   K 3.8 09/13/2011 1119   K 4.5 05/02/2011 0954   CL 103 09/13/2011 1119   CL 93* 05/02/2011 0954   CO2 28 09/13/2011 1119   CO2 29 05/02/2011 0954   GLUCOSE 105* 09/13/2011 1119   GLUCOSE 119* 05/02/2011 0954   BUN 14 09/13/2011 1119   BUN 12 05/02/2011 0954   CREATININE 0.46* 09/13/2011 1119   CREATININE 0.5* 05/02/2011 0954   CALCIUM 9.1 09/13/2011 1119   CALCIUM 9.3 05/02/2011 0954   PROT 7.1 09/13/2011 1119   PROT 8.1 05/02/2011 0954   ALBUMIN 3.3* 09/13/2011 1119   AST 35 09/13/2011 1119   AST 31 05/02/2011 0954   ALT 34 09/13/2011 1119   ALKPHOS 71 09/13/2011 1119     ALKPHOS 78 05/02/2011 0954   BILITOT 0.2* 09/13/2011 1119   BILITOT 0.40 05/02/2011 0954   GFRNONAA NOT CALCULATED 05/12/2011 1106   GFRAA NOT CALCULATED 05/12/2011 1106     Studies/Results: Ct Abdomen Pelvis W Contrast  08/15/2011  *RADIOLOGY REPORT*  Clinical Data: Follow-up metastatic colon carcinoma.  CT ABDOMEN AND PELVIS WITH CONTRAST  Technique:  Multidetector CT imaging of the abdomen and pelvis was performed following the standard protocol during bolus administration of intravenous contrast.  Contrast: OMNIPAQUE IOHEXOL 300 MG/ML IV SOLN  Comparison: 05/02/2011  Findings: Decrease in size of hypovascular mass is seen in the right hepatic lobe which now measures 2.9 x 3.8 cm compared to 3.5 x 4.8 cm on prior exam. Altered hepatic perfusion and mild dilatation of intrahepatic bile ducts is again seen peripheral to this right hepatic lobe mass.  Other liver mass in the central left hepatic lobe adjacent to the left portal vein has also decreased in size, now measuring 1.3 x 2.1 cm compared to 3.0 x 3.8 cm on prior exam. Diffuse hepatic steatosis is again demonstrated.  No new or enlarging liver masses are identified.  Decreased lymphadenopathy is seen in the porta hepatis since prior exam, with largest lymph node now measuring 1.6 cm in short axis compared to 2.3 cm on prior exam.  No other areas  of lymphadenopathy identified within the abdomen or pelvis.  The other abdominal parenchymal organs are normal in appearance.  No evidence of hydronephrosis.  Small uterine fibroids again seen, largest measuring 3 cm, which are stable.  No other soft tissue masses identified.  No evidence of inflammatory process or ascites.  No evidence of bowel wall thickening or dilatation.  No suspicious bone lesions are identified.  Severe thoracolumbar scoliosis and spondylosis again noted.  IMPRESSION:  1.  Decrease in size of liver metastases since prior study. 2.  Decreased porta hepatis lymphadenopathy. 3.  No  new or progressive disease within the abdomen or pelvis. 4.  Stable small uterine fibroids.  Original Report Authenticated By: Danae Orleans, M.D.    Medications: I have reviewed the patient's current medications.  Assessment/Plan:  1. 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 She completed 12 cycles of FOLFOX chemotherapy. Oxaliplatin was held with cycles number 5, 8, 11, and 12. 2. Metastatic colon cancer confirmed on a restaging CT evaluation 05/02/2011 with liver metastases and periportal lymphadenopathy. She began treatment with FOLFIRI on 06/01/2011. Avastin was added beginning with cycle number 2. She completed cycle #6 08/08/2011. Restaging CT evaluation 08/15/2011 showed improvement in the liver metastases and porta hepatis lymphadenopathy. She completed cycle #7 FOLFIRI/Avastin 08/22/2011. 3. Status post Port-A-Cath placement and Port-A-Cath removal by Dr. Michaell Cowing. A new Port-A-Cath was placed on 05/16/2011. The Port-A-Cath was removed due to malposition. A new Port-A-Cath was placed on 05/30/2011. 4. Spina bifida with severe scoliosis.  5. Chronic bilateral foot drop/lower leg and foot numbness. 6. Hypothyroidism. 7. History of chemotherapy-induced diarrhea. The bolus and infusional 5-FU were reduced by 25% beginning with cycle number 2 of FOLFOX. The 5-FU dose was decreased by an additional 10% beginning with cycle number 8. 8. History of delayed nausea following chemotherapy.  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 The oxaliplatin was dose reduced by 20% beginning with cycle number 6. The neuropathy symptoms resolved. 10. History of neutropenia secondary to chemotherapy. 11. Status post a colonoscopy 05/05/2010. There was a previous right colectomy anastomosis and slight nodularity. Biopsies were taken which showed benign colonic mucosa with  hyperplastic epithelial changes. No significant inflammation, granuloma, or adenomatous epithelium was identified. 12. Diarrhea following FOLFIRI chemotherapy. Overall the diarrhea is controlled with Lomotil.  13. History of a fungal rash in the groin, improved with Diflucan and nystatin powder. 14. Mouth ulcers, oral candidiasis following cycle #6 of FOLFIRI-the ulcers resolve following treatment with Diflucan.        Disposition-Ms. Mcclaine appears stable. Plan to proceed with cycle 8 FOLFIRI/Avastin today as scheduled. She will return for a followup visit and cycle 9 on 09/26/2011. She will contact the office in the interim with any problems.    Lonna Cobb ANP/GNP-BC

## 2011-09-15 ENCOUNTER — Ambulatory Visit (HOSPITAL_BASED_OUTPATIENT_CLINIC_OR_DEPARTMENT_OTHER): Payer: Medicare HMO

## 2011-09-15 VITALS — BP 130/82 | HR 82 | Temp 98.9°F

## 2011-09-15 DIAGNOSIS — C189 Malignant neoplasm of colon, unspecified: Secondary | ICD-10-CM

## 2011-09-15 MED ORDER — HEPARIN SOD (PORK) LOCK FLUSH 100 UNIT/ML IV SOLN
500.0000 [IU] | Freq: Once | INTRAVENOUS | Status: AC
  Administered 2011-09-15: 500 [IU]
  Filled 2011-09-15: qty 5

## 2011-09-15 MED ORDER — SODIUM CHLORIDE 0.9 % IJ SOLN
10.0000 mL | INTRAMUSCULAR | Status: DC | PRN
  Administered 2011-09-15: 10 mL
  Filled 2011-09-15: qty 10

## 2011-09-19 ENCOUNTER — Other Ambulatory Visit: Payer: Self-pay | Admitting: Certified Registered Nurse Anesthetist

## 2011-09-25 ENCOUNTER — Other Ambulatory Visit: Payer: Self-pay | Admitting: *Deleted

## 2011-09-25 ENCOUNTER — Other Ambulatory Visit: Payer: Self-pay | Admitting: Oncology

## 2011-09-25 DIAGNOSIS — C189 Malignant neoplasm of colon, unspecified: Secondary | ICD-10-CM

## 2011-09-26 ENCOUNTER — Ambulatory Visit (HOSPITAL_BASED_OUTPATIENT_CLINIC_OR_DEPARTMENT_OTHER): Payer: Medicare HMO | Admitting: Oncology

## 2011-09-26 ENCOUNTER — Other Ambulatory Visit (HOSPITAL_BASED_OUTPATIENT_CLINIC_OR_DEPARTMENT_OTHER): Payer: Medicare HMO | Admitting: Lab

## 2011-09-26 ENCOUNTER — Ambulatory Visit (HOSPITAL_BASED_OUTPATIENT_CLINIC_OR_DEPARTMENT_OTHER): Payer: Medicare HMO

## 2011-09-26 VITALS — BP 121/81 | HR 77 | Temp 97.1°F | Ht 60.0 in | Wt 155.6 lb

## 2011-09-26 DIAGNOSIS — C787 Secondary malignant neoplasm of liver and intrahepatic bile duct: Secondary | ICD-10-CM

## 2011-09-26 DIAGNOSIS — C180 Malignant neoplasm of cecum: Secondary | ICD-10-CM

## 2011-09-26 DIAGNOSIS — C772 Secondary and unspecified malignant neoplasm of intra-abdominal lymph nodes: Secondary | ICD-10-CM

## 2011-09-26 DIAGNOSIS — C189 Malignant neoplasm of colon, unspecified: Secondary | ICD-10-CM

## 2011-09-26 DIAGNOSIS — Z79899 Other long term (current) drug therapy: Secondary | ICD-10-CM

## 2011-09-26 DIAGNOSIS — Z5111 Encounter for antineoplastic chemotherapy: Secondary | ICD-10-CM

## 2011-09-26 LAB — COMPREHENSIVE METABOLIC PANEL
ALT: 34 U/L (ref 0–35)
Alkaline Phosphatase: 78 U/L (ref 39–117)
CO2: 24 mEq/L (ref 19–32)
Creatinine, Ser: 0.44 mg/dL — ABNORMAL LOW (ref 0.50–1.10)
Total Bilirubin: 0.2 mg/dL — ABNORMAL LOW (ref 0.3–1.2)

## 2011-09-26 LAB — CBC WITH DIFFERENTIAL/PLATELET
BASO%: 0.4 % (ref 0.0–2.0)
Eosinophils Absolute: 0.2 10*3/uL (ref 0.0–0.5)
HCT: 38.5 % (ref 34.8–46.6)
LYMPH%: 26.3 % (ref 14.0–49.7)
MCHC: 33.5 g/dL (ref 31.5–36.0)
MCV: 92.1 fL (ref 79.5–101.0)
MONO#: 0.5 10*3/uL (ref 0.1–0.9)
NEUT%: 63.4 % (ref 38.4–76.8)
Platelets: 232 10*3/uL (ref 145–400)
WBC: 6.8 10*3/uL (ref 3.9–10.3)

## 2011-09-26 MED ORDER — SODIUM CHLORIDE 0.9 % IV SOLN
1475.0000 mg/m2 | INTRAVENOUS | Status: DC
  Administered 2011-09-26: 2600 mg via INTRAVENOUS
  Filled 2011-09-26: qty 52

## 2011-09-26 MED ORDER — PALONOSETRON HCL INJECTION 0.25 MG/5ML
0.2500 mg | Freq: Once | INTRAVENOUS | Status: AC
  Administered 2011-09-26: 0.25 mg via INTRAVENOUS

## 2011-09-26 MED ORDER — ATROPINE SULFATE 1 MG/ML IJ SOLN
0.5000 mg | Freq: Once | INTRAMUSCULAR | Status: AC | PRN
  Administered 2011-09-26: 0.5 mg via INTRAVENOUS

## 2011-09-26 MED ORDER — DEXTROSE 5 % IV SOLN
375.0000 mg/m2 | Freq: Once | INTRAVENOUS | Status: AC
  Administered 2011-09-26: 660 mg via INTRAVENOUS
  Filled 2011-09-26: qty 33

## 2011-09-26 MED ORDER — FLUOROURACIL CHEMO INJECTION 500 MG/10ML
250.0000 mg/m2 | Freq: Once | INTRAVENOUS | Status: AC
  Administered 2011-09-26: 450 mg via INTRAVENOUS
  Filled 2011-09-26: qty 9

## 2011-09-26 MED ORDER — HEPARIN SOD (PORK) LOCK FLUSH 100 UNIT/ML IV SOLN
500.0000 [IU] | Freq: Once | INTRAVENOUS | Status: DC
  Filled 2011-09-26: qty 5

## 2011-09-26 MED ORDER — IRINOTECAN HCL CHEMO INJECTION 100 MG/5ML
170.0000 mg/m2 | Freq: Once | INTRAVENOUS | Status: AC
  Administered 2011-09-26: 300 mg via INTRAVENOUS
  Filled 2011-09-26: qty 15

## 2011-09-26 MED ORDER — SODIUM CHLORIDE 0.9 % IV SOLN: Freq: Once | INTRAVENOUS | Status: DC

## 2011-09-26 MED ORDER — DEXAMETHASONE SODIUM PHOSPHATE 4 MG/ML IJ SOLN
20.0000 mg | Freq: Once | INTRAMUSCULAR | Status: AC
  Administered 2011-09-26: 20 mg via INTRAVENOUS

## 2011-09-26 MED ORDER — SODIUM CHLORIDE 0.9 % IV SOLN
5.0000 mg/kg | Freq: Once | INTRAVENOUS | Status: AC
  Administered 2011-09-26: 375 mg via INTRAVENOUS
  Filled 2011-09-26: qty 15

## 2011-09-26 NOTE — Progress Notes (Signed)
OFFICE PROGRESS NOTE   INTERVAL HISTORY:   She completed another cycle of chemotherapy on 09/13/2011. She tolerated chemotherapy well. The diarrhea is controlled with Lomotil. She reports soreness at the gums following chemotherapy, but no discrete ulcers. Nausea following chemotherapy is relieved with Compazine. She has mild bleeding when she blows her nose. No other bleeding.  Objective:  Vital signs in last 24 hours:  Blood pressure 121/81, pulse 77, temperature 97.1 F (36.2 C), temperature source Oral, height 5' (1.524 m), weight 155 lb 9.6 oz (70.58 kg).    HEENT: No thrush or ulcers Resp: Inspiratory rhonchi at the left base. No respiratory distress. Cardio: Regular rate and rhythm GI: Mild tenderness in the upper abdomen bilaterally. No hepatomegaly. No mass. Vascular: No leg edema    Portacath/PICC-without erythema  Lab Results:  Lab Results  Component Value Date   WBC 6.8 09/26/2011   HGB 12.9 09/26/2011   HCT 38.5 09/26/2011   MCV 92.1 09/26/2011   PLT 232 09/26/2011   ANC 4.3 Urine protein-less than 30    Medications: I have reviewed the patient's current medications.  Assessment/Plan: 1. 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 She completed 12 cycles of FOLFOX chemotherapy. Oxaliplatin was held with cycles number 5, 8, 11, and 12. 2. Metastatic colon cancer confirmed on a restaging CT evaluation 05/02/2011 with liver metastases and periportal lymphadenopathy. She began treatment with FOLFIRI on 06/01/2011. Avastin was added beginning with cycle number 2. She completed cycle #6 08/08/2011. Restaging CT evaluation 08/15/2011 showed improvement in the liver metastases and porta hepatis lymphadenopathy. She completed cycle #8 FOLFIRI/Avastin 09/13/2011. 3. Status post Port-A-Cath placement and Port-A-Cath removal by Dr. Michaell Cowing. A new Port-A-Cath was  placed on 05/16/2011. The Port-A-Cath was removed due to malposition. A new Port-A-Cath was placed on 05/30/2011. 4. Spina bifida with severe scoliosis.  5. Chronic bilateral foot drop/lower leg and foot numbness. 6. Hypothyroidism. 7. History of chemotherapy-induced diarrhea. The bolus and infusional 5-FU were reduced by 25% beginning with cycle number 2 of FOLFOX. The 5-FU dose was decreased by an additional 10% beginning with cycle number 8. 8. History of delayed nausea following chemotherapy.  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 The oxaliplatin was dose reduced by 20% beginning with cycle number 6. The neuropathy symptoms resolved. 10. History of neutropenia secondary to chemotherapy. 11. Status post a colonoscopy 05/05/2010. There was a previous right colectomy anastomosis and slight nodularity. Biopsies were taken which showed benign colonic mucosa with hyperplastic epithelial changes. No significant inflammation, granuloma, or adenomatous epithelium was identified. 12. Diarrhea following FOLFIRI chemotherapy. Overall the diarrhea is controlled with Lomotil.  13. History of a fungal rash in the groin, improved with Diflucan and nystatin powder. 14. Mouth ulcers, oral candidiasis following cycle #6 of FOLFIRI-the ulcers resolve following treatment with Diflucan.   Disposition:  She appears stable. She will complete another cycle of FOLFIRI/Avastin beginning today. She will return for an office visit and chemotherapy in 2 weeks. The plan is to schedule a restaging CT at a three-month interval.   Lucile Shutters, MD  09/26/2011  12:35 PM

## 2011-09-26 NOTE — Patient Instructions (Signed)
Patient aware of next appointment; discharged home with pump; pump d/c changed from 0915 09/28/11 to 1115 09/28/11; patient discharged home with husband.  Reports no pain.

## 2011-09-28 ENCOUNTER — Ambulatory Visit (HOSPITAL_BASED_OUTPATIENT_CLINIC_OR_DEPARTMENT_OTHER): Payer: Medicare HMO

## 2011-09-28 VITALS — BP 130/91 | HR 88 | Temp 98.3°F

## 2011-09-28 DIAGNOSIS — C189 Malignant neoplasm of colon, unspecified: Secondary | ICD-10-CM

## 2011-09-28 MED ORDER — SODIUM CHLORIDE 0.9 % IJ SOLN
10.0000 mL | INTRAMUSCULAR | Status: DC | PRN
  Administered 2011-09-28: 10 mL
  Filled 2011-09-28: qty 10

## 2011-09-28 MED ORDER — HEPARIN SOD (PORK) LOCK FLUSH 100 UNIT/ML IV SOLN
500.0000 [IU] | Freq: Once | INTRAVENOUS | Status: AC
  Administered 2011-09-28: 500 [IU]
  Filled 2011-09-28: qty 5

## 2011-09-29 ENCOUNTER — Other Ambulatory Visit: Payer: Self-pay | Admitting: Certified Registered Nurse Anesthetist

## 2011-10-08 ENCOUNTER — Other Ambulatory Visit: Payer: Self-pay | Admitting: Oncology

## 2011-10-10 ENCOUNTER — Ambulatory Visit (HOSPITAL_BASED_OUTPATIENT_CLINIC_OR_DEPARTMENT_OTHER): Payer: Medicare HMO | Admitting: Nurse Practitioner

## 2011-10-10 ENCOUNTER — Other Ambulatory Visit (HOSPITAL_BASED_OUTPATIENT_CLINIC_OR_DEPARTMENT_OTHER): Payer: Medicare HMO | Admitting: Lab

## 2011-10-10 ENCOUNTER — Ambulatory Visit (HOSPITAL_BASED_OUTPATIENT_CLINIC_OR_DEPARTMENT_OTHER): Payer: Medicare HMO

## 2011-10-10 ENCOUNTER — Telehealth: Payer: Self-pay | Admitting: Nurse Practitioner

## 2011-10-10 VITALS — BP 114/78 | HR 70

## 2011-10-10 VITALS — BP 115/69 | HR 71 | Temp 97.2°F | Ht 60.0 in | Wt 155.2 lb

## 2011-10-10 DIAGNOSIS — C189 Malignant neoplasm of colon, unspecified: Secondary | ICD-10-CM

## 2011-10-10 DIAGNOSIS — C180 Malignant neoplasm of cecum: Secondary | ICD-10-CM

## 2011-10-10 DIAGNOSIS — Z5112 Encounter for antineoplastic immunotherapy: Secondary | ICD-10-CM

## 2011-10-10 DIAGNOSIS — Q059 Spina bifida, unspecified: Secondary | ICD-10-CM

## 2011-10-10 DIAGNOSIS — Z5111 Encounter for antineoplastic chemotherapy: Secondary | ICD-10-CM

## 2011-10-10 DIAGNOSIS — M216X9 Other acquired deformities of unspecified foot: Secondary | ICD-10-CM

## 2011-10-10 DIAGNOSIS — C787 Secondary malignant neoplasm of liver and intrahepatic bile duct: Secondary | ICD-10-CM

## 2011-10-10 LAB — CBC WITH DIFFERENTIAL/PLATELET
BASO%: 0.5 % (ref 0.0–2.0)
EOS%: 2.6 % (ref 0.0–7.0)
HCT: 36.9 % (ref 34.8–46.6)
LYMPH%: 32.8 % (ref 14.0–49.7)
MCH: 30.9 pg (ref 25.1–34.0)
MCHC: 33.5 g/dL (ref 31.5–36.0)
NEUT%: 53.9 % (ref 38.4–76.8)
RBC: 3.99 10*6/uL (ref 3.70–5.45)
WBC: 5 10*3/uL (ref 3.9–10.3)
lymph#: 1.6 10*3/uL (ref 0.9–3.3)

## 2011-10-10 LAB — COMPREHENSIVE METABOLIC PANEL
ALT: 35 U/L (ref 0–35)
AST: 40 U/L — ABNORMAL HIGH (ref 0–37)
Creatinine, Ser: 0.43 mg/dL — ABNORMAL LOW (ref 0.50–1.10)
Sodium: 138 mEq/L (ref 135–145)
Total Bilirubin: 0.2 mg/dL — ABNORMAL LOW (ref 0.3–1.2)
Total Protein: 7.2 g/dL (ref 6.0–8.3)

## 2011-10-10 MED ORDER — LEUCOVORIN CALCIUM INJECTION 350 MG
375.0000 mg/m2 | Freq: Once | INTRAVENOUS | Status: AC
  Administered 2011-10-10: 660 mg via INTRAVENOUS
  Filled 2011-10-10: qty 33

## 2011-10-10 MED ORDER — SODIUM CHLORIDE 0.9 % IV SOLN
Freq: Once | INTRAVENOUS | Status: AC
  Administered 2011-10-10: 11:00:00 via INTRAVENOUS

## 2011-10-10 MED ORDER — DEXAMETHASONE SODIUM PHOSPHATE 4 MG/ML IJ SOLN
20.0000 mg | Freq: Once | INTRAMUSCULAR | Status: AC
  Administered 2011-10-10: 20 mg via INTRAVENOUS

## 2011-10-10 MED ORDER — SODIUM CHLORIDE 0.9 % IV SOLN
5.0000 mg/kg | Freq: Once | INTRAVENOUS | Status: AC
  Administered 2011-10-10: 375 mg via INTRAVENOUS
  Filled 2011-10-10: qty 15

## 2011-10-10 MED ORDER — FLUOROURACIL CHEMO INJECTION 500 MG/10ML
250.0000 mg/m2 | Freq: Once | INTRAVENOUS | Status: AC
  Administered 2011-10-10: 450 mg via INTRAVENOUS
  Filled 2011-10-10: qty 9

## 2011-10-10 MED ORDER — SODIUM CHLORIDE 0.9 % IV SOLN
1475.0000 mg/m2 | INTRAVENOUS | Status: DC
  Administered 2011-10-10: 2600 mg via INTRAVENOUS
  Filled 2011-10-10: qty 52

## 2011-10-10 MED ORDER — IRINOTECAN HCL CHEMO INJECTION 100 MG/5ML
170.0000 mg/m2 | Freq: Once | INTRAVENOUS | Status: AC
  Administered 2011-10-10: 300 mg via INTRAVENOUS
  Filled 2011-10-10: qty 15

## 2011-10-10 MED ORDER — PALONOSETRON HCL INJECTION 0.25 MG/5ML
0.2500 mg | Freq: Once | INTRAVENOUS | Status: AC
  Administered 2011-10-10: 0.25 mg via INTRAVENOUS

## 2011-10-10 MED ORDER — ATROPINE SULFATE 1 MG/ML IJ SOLN: 0.5000 mg | Freq: Once | INTRAMUSCULAR | Status: DC | PRN

## 2011-10-10 NOTE — Progress Notes (Signed)
OFFICE PROGRESS NOTE  Interval history:  April Spence is a 56 year old woman with metastatic colon cancer currently being treated with FOLFIRI/Avastin. She completed cycle #9 09/26/2011. She is seen today for scheduled followup.  April Spence reports nausea for 2 days following the most recent cycle of chemotherapy. The nausea was relieved with her home anti-emetic. Diarrhea is controlled with Lomotil. She had one loose stool yesterday. Her gums again became "sore". She did not develop ulcerations. She continues to note a small amount of blood when she blows her nose. She denies shortness of breath and chest pain. No calf pain. She has periodic mild right-sided abdominal pain.   Objective: Blood pressure 115/69, pulse 71, temperature 97.2 F (36.2 C), temperature source Oral, height 5' (1.524 m), weight 155 lb 3.2 oz (70.398 kg).  Oropharynx is without thrush or ulceration. Lungs clear. No wheezes or rales. Regular cardiac rhythm. Port-A-Cath site without erythema. Abdomen soft and nontender. No hepatomegaly. No leg edema.  Lab Results: Lab Results  Component Value Date   WBC 5.0 10/10/2011   HGB 12.4 10/10/2011   HCT 36.9 10/10/2011   MCV 92.3 10/10/2011   PLT 192 10/10/2011    Chemistry:    Chemistry      Component Value Date/Time   NA 137 09/26/2011 0922   NA 136 05/02/2011 0954   K 3.8 09/26/2011 0922   K 4.5 05/02/2011 0954   CL 102 09/26/2011 0922   CL 93* 05/02/2011 0954   CO2 24 09/26/2011 0922   CO2 29 05/02/2011 0954   BUN 13 09/26/2011 0922   BUN 12 05/02/2011 0954   CREATININE 0.44* 09/26/2011 0922   CREATININE 0.5* 05/02/2011 0954      Component Value Date/Time   CALCIUM 9.7 09/26/2011 0922   CALCIUM 9.3 05/02/2011 0954   ALKPHOS 78 09/26/2011 0922   ALKPHOS 78 05/02/2011 0954   AST 39* 09/26/2011 0922   AST 31 05/02/2011 0954   ALT 34 09/26/2011 0922   BILITOT 0.2* 09/26/2011 0922   BILITOT 0.40 05/02/2011 0954       Studies/Results: No results found.  Medications: I have  reviewed the patient's current medications.  Assessment/Plan:  1. 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 She completed 12 cycles of FOLFOX chemotherapy. Oxaliplatin was held with cycles number 5, 8, 11, and 12. 2. Metastatic colon cancer confirmed on a restaging CT evaluation 05/02/2011 with liver metastases and periportal lymphadenopathy. She began treatment with FOLFIRI on 06/01/2011. Avastin was added beginning with cycle number 2. She completed cycle #6 08/08/2011. Restaging CT evaluation 08/15/2011 showed improvement in the liver metastases and porta hepatis lymphadenopathy. She completed cycle #9 FOLFIRI/Avastin 09/26/2011. 3. Status post Port-A-Cath placement and Port-A-Cath removal by Dr. Michaell Cowing. A new Port-A-Cath was placed on 05/16/2011. The Port-A-Cath was removed due to malposition. A new Port-A-Cath was placed on 05/30/2011. 4. Spina bifida with severe scoliosis.  5. Chronic bilateral foot drop/lower leg and foot numbness. 6. Hypothyroidism. 7. History of chemotherapy-induced diarrhea. The bolus and infusional 5-FU were reduced by 25% beginning with cycle number 2 of FOLFOX. The 5-FU dose was decreased by an additional 10% beginning with cycle number 8. 8. History of delayed nausea following chemotherapy.  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 The oxaliplatin was dose reduced by 20% beginning with cycle number 6. The neuropathy symptoms resolved. 10. History of neutropenia secondary  to chemotherapy. 11. Status post a colonoscopy 05/05/2010. There was a previous right colectomy anastomosis and slight nodularity. Biopsies were taken which showed benign colonic mucosa with hyperplastic epithelial changes. No significant inflammation, granuloma, or adenomatous epithelium was identified. 12. Diarrhea following FOLFIRI chemotherapy. Overall the  diarrhea is controlled with Lomotil.  13. History of a fungal rash in the groin, improved with Diflucan and nystatin powder. 14. Mouth ulcers, oral candidiasis following cycle #6 of FOLFIRI-the ulcers resolved following treatment with Diflucan.  Disposition-April Spence appears stable. Plan to proceed with cycle 10 FOLFIRI/Avastin today as scheduled. She will return for a followup visit and cycle 11 in 2 weeks. She will contact the office the interim with any problems.  Lonna Cobb ANP/GNP-BC

## 2011-10-10 NOTE — Telephone Encounter (Signed)
Notified April Spence potassium level is low. Prescription called to [facility] on [address] [phone]) for Kdur daily.

## 2011-10-10 NOTE — Progress Notes (Signed)
VAD access connected to   [facility] pump. Pump set and locked to deliver  A rate of 4.4 ml per hour for 202

## 2011-10-11 ENCOUNTER — Other Ambulatory Visit: Payer: Self-pay | Admitting: Certified Registered Nurse Anesthetist

## 2011-10-12 ENCOUNTER — Ambulatory Visit (HOSPITAL_BASED_OUTPATIENT_CLINIC_OR_DEPARTMENT_OTHER): Payer: Medicare HMO

## 2011-10-12 VITALS — BP 143/89 | HR 72 | Temp 98.8°F

## 2011-10-12 DIAGNOSIS — Z452 Encounter for adjustment and management of vascular access device: Secondary | ICD-10-CM

## 2011-10-12 DIAGNOSIS — C180 Malignant neoplasm of cecum: Secondary | ICD-10-CM

## 2011-10-12 DIAGNOSIS — C189 Malignant neoplasm of colon, unspecified: Secondary | ICD-10-CM

## 2011-10-12 MED ORDER — HEPARIN SOD (PORK) LOCK FLUSH 100 UNIT/ML IV SOLN
500.0000 [IU] | Freq: Once | INTRAVENOUS | Status: AC
  Administered 2011-10-12: 500 [IU]
  Filled 2011-10-12: qty 5

## 2011-10-12 MED ORDER — SODIUM CHLORIDE 0.9 % IJ SOLN
10.0000 mL | INTRAMUSCULAR | Status: DC | PRN
  Administered 2011-10-12: 10 mL
  Filled 2011-10-12: qty 10

## 2011-10-22 ENCOUNTER — Other Ambulatory Visit: Payer: Self-pay | Admitting: Oncology

## 2011-10-24 ENCOUNTER — Ambulatory Visit (HOSPITAL_BASED_OUTPATIENT_CLINIC_OR_DEPARTMENT_OTHER): Payer: Medicare HMO

## 2011-10-24 ENCOUNTER — Telehealth: Payer: Self-pay | Admitting: Oncology

## 2011-10-24 ENCOUNTER — Other Ambulatory Visit (HOSPITAL_BASED_OUTPATIENT_CLINIC_OR_DEPARTMENT_OTHER): Payer: Medicare HMO | Admitting: Lab

## 2011-10-24 ENCOUNTER — Ambulatory Visit: Payer: Medicare HMO | Admitting: Oncology

## 2011-10-24 VITALS — BP 110/77 | HR 75 | Temp 97.1°F | Ht 60.0 in | Wt 154.0 lb

## 2011-10-24 VITALS — BP 114/75 | HR 81 | Temp 97.7°F

## 2011-10-24 DIAGNOSIS — C189 Malignant neoplasm of colon, unspecified: Secondary | ICD-10-CM

## 2011-10-24 DIAGNOSIS — Z5111 Encounter for antineoplastic chemotherapy: Secondary | ICD-10-CM

## 2011-10-24 LAB — COMPREHENSIVE METABOLIC PANEL
AST: 48 U/L — ABNORMAL HIGH (ref 0–37)
Alkaline Phosphatase: 79 U/L (ref 39–117)
BUN: 14 mg/dL (ref 6–23)
Calcium: 9.7 mg/dL (ref 8.4–10.5)
Creatinine, Ser: 0.44 mg/dL — ABNORMAL LOW (ref 0.50–1.10)

## 2011-10-24 LAB — CBC WITH DIFFERENTIAL/PLATELET
Basophils Absolute: 0 10*3/uL (ref 0.0–0.1)
EOS%: 3.2 % (ref 0.0–7.0)
HCT: 38.7 % (ref 34.8–46.6)
HGB: 12.9 g/dL (ref 11.6–15.9)
MCH: 31.2 pg (ref 25.1–34.0)
MCV: 93.4 fL (ref 79.5–101.0)
MONO%: 9 % (ref 0.0–14.0)
NEUT%: 58 % (ref 38.4–76.8)

## 2011-10-24 MED ORDER — HEPARIN SOD (PORK) LOCK FLUSH 100 UNIT/ML IV SOLN
500.0000 [IU] | Freq: Once | INTRAVENOUS | Status: DC
  Filled 2011-10-24: qty 5

## 2011-10-24 MED ORDER — DEXAMETHASONE SODIUM PHOSPHATE 4 MG/ML IJ SOLN
20.0000 mg | Freq: Once | INTRAMUSCULAR | Status: AC
  Administered 2011-10-24: 20 mg via INTRAVENOUS

## 2011-10-24 MED ORDER — SODIUM CHLORIDE 0.9 % IV SOLN
1475.0000 mg/m2 | INTRAVENOUS | Status: DC
  Administered 2011-10-24: 2600 mg via INTRAVENOUS
  Filled 2011-10-24: qty 52

## 2011-10-24 MED ORDER — PALONOSETRON HCL INJECTION 0.25 MG/5ML
0.2500 mg | Freq: Once | INTRAVENOUS | Status: AC
  Administered 2011-10-24: 0.25 mg via INTRAVENOUS

## 2011-10-24 MED ORDER — SODIUM CHLORIDE 0.9 % IV SOLN
5.0000 mg/kg | Freq: Once | INTRAVENOUS | Status: AC
  Administered 2011-10-24: 375 mg via INTRAVENOUS
  Filled 2011-10-24: qty 15

## 2011-10-24 MED ORDER — ATROPINE SULFATE 0.4 MG/ML IJ SOLN
0.5000 mg | Freq: Once | INTRAMUSCULAR | Status: AC | PRN
  Administered 2011-10-24: 0.5 mg via INTRAVENOUS
  Filled 2011-10-24: qty 1.25

## 2011-10-24 MED ORDER — DEXTROSE 5 % IV SOLN
375.0000 mg/m2 | Freq: Once | INTRAVENOUS | Status: AC
  Administered 2011-10-24: 660 mg via INTRAVENOUS
  Filled 2011-10-24: qty 33

## 2011-10-24 MED ORDER — SODIUM CHLORIDE 0.9 % IJ SOLN
10.0000 mL | INTRAMUSCULAR | Status: DC | PRN
  Filled 2011-10-24: qty 10

## 2011-10-24 MED ORDER — FLUOROURACIL CHEMO INJECTION 500 MG/10ML
250.0000 mg/m2 | Freq: Once | INTRAVENOUS | Status: AC
  Administered 2011-10-24: 450 mg via INTRAVENOUS
  Filled 2011-10-24: qty 9

## 2011-10-24 MED ORDER — IRINOTECAN HCL CHEMO INJECTION 100 MG/5ML
170.0000 mg/m2 | Freq: Once | INTRAVENOUS | Status: AC
  Administered 2011-10-24: 300 mg via INTRAVENOUS
  Filled 2011-10-24: qty 15

## 2011-10-24 MED ORDER — SODIUM CHLORIDE 0.9 % IV SOLN
Freq: Once | INTRAVENOUS | Status: AC
  Administered 2011-10-24: 12:00:00 via INTRAVENOUS

## 2011-10-24 NOTE — Progress Notes (Signed)
OFFICE PROGRESS NOTE   INTERVAL HISTORY:   She returns as scheduled. She completed another cycle of chemotherapy on 10/10/2011. She reports nausea on the day of chemotherapy. No emesis. She has mild bleeding when she blows her nose. No other bleeding. No other complaints.  Objective:  Vital signs in last 24 hours:  Blood pressure 110/77, pulse 75, temperature 97.1 F (36.2 C), temperature source Oral, height 5' (1.524 m), weight 154 lb (69.854 kg).    HEENT: No thrush or ulcers Resp: Decreased breath sounds with inspiratory rhonchi at the low posterior chest bilaterally. No respiratory distress. Cardio: Regular rate and rhythm GI: No hepatomegaly, mild tenderness in the right abdomen Vascular: No leg edema    Portacath/PICC-without erythema  Lab Results:  Lab Results  Component Value Date   WBC 7.0 10/24/2011   HGB 12.9 10/24/2011   HCT 38.7 10/24/2011   MCV 93.4 10/24/2011   PLT 206 10/24/2011   ANC 4.0    Medications: I have reviewed the patient's current medications.  Assessment/Plan: 1. 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 She completed 12 cycles of FOLFOX chemotherapy. Oxaliplatin was held with cycles number 5, 8, 11, and 12. 2. Metastatic colon cancer confirmed on a restaging CT evaluation 05/02/2011 with liver metastases and periportal lymphadenopathy. She began treatment with FOLFIRI on 06/01/2011. Avastin was added beginning with cycle number 2. She completed cycle #6 08/08/2011. Restaging CT evaluation 08/15/2011 showed improvement in the liver metastases and porta hepatis lymphadenopathy. She completed cycle #10 FOLFIRI/Avastin 10/10/2011. 3. Status post Port-A-Cath placement and Port-A-Cath removal by Dr. Michaell Cowing. A new Port-A-Cath was placed on 05/16/2011. The Port-A-Cath was removed due to malposition. A new Port-A-Cath was placed on  05/30/2011. 4. Spina bifida with severe scoliosis.  5. Chronic bilateral foot drop/lower leg and foot numbness. 6. Hypothyroidism. 7. History of chemotherapy-induced diarrhea. The bolus and infusional 5-FU were reduced by 25% beginning with cycle number 2 of FOLFOX. The 5-FU dose was decreased by an additional 10% beginning with cycle number 8. 8. History of delayed nausea following chemotherapy-she will contact us if she has significant nausea following this cycle of chemotherapy . 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 The oxaliplatin was dose reduced by 20% beginning with cycle number 6. The neuropathy symptoms resolved. 10. History of neutropenia secondary to chemotherapy. 11. Status post a colonoscopy 05/05/2010. There was a previous right colectomy anastomosis and slight nodularity. Biopsies were taken which showed benign colonic mucosa with hyperplastic epithelial changes. No significant inflammation, granuloma, or adenomatous epithelium was identified. 12. Diarrhea following FOLFIRI chemotherapy. Overall the diarrhea is controlled with Lomotil.  13. History of a fungal rash in the groin, improved with Diflucan and nystatin powder. 14. Mouth ulcers, oral candidiasis following cycle #6 of FOLFIRI-the ulcers resolved following treatment with Diflucan   Disposition:  She appears stable. She will complete cycle 11 of FOLFIRI/Avastin today and cycle 12 on 11/07/2011. She will be scheduled for a restaging CT evaluation prior to an office visit on 11/28/2011.   Lucile Shutters, MD  10/24/2011  11:22 AM

## 2011-10-24 NOTE — Telephone Encounter (Signed)
Appts made and printed for lab tx md and scans   aom

## 2011-10-25 ENCOUNTER — Telehealth: Payer: Self-pay | Admitting: *Deleted

## 2011-10-25 NOTE — Telephone Encounter (Signed)
Patient and husband are very anxious and want CT scan in March to include the chest since she has had spots in liver.

## 2011-10-26 ENCOUNTER — Ambulatory Visit (HOSPITAL_BASED_OUTPATIENT_CLINIC_OR_DEPARTMENT_OTHER): Payer: Medicare HMO

## 2011-10-26 VITALS — BP 130/84 | HR 72 | Temp 98.8°F

## 2011-10-26 DIAGNOSIS — C189 Malignant neoplasm of colon, unspecified: Secondary | ICD-10-CM

## 2011-10-26 MED ORDER — SODIUM CHLORIDE 0.9 % IJ SOLN
10.0000 mL | INTRAMUSCULAR | Status: DC | PRN
  Administered 2011-10-26: 10 mL
  Filled 2011-10-26: qty 10

## 2011-10-26 MED ORDER — HEPARIN SOD (PORK) LOCK FLUSH 100 UNIT/ML IV SOLN
500.0000 [IU] | Freq: Once | INTRAVENOUS | Status: AC
  Administered 2011-10-26: 500 [IU]
  Filled 2011-10-26: qty 5

## 2011-10-30 NOTE — Telephone Encounter (Signed)
Ok to include non-contrast Chest CT

## 2011-10-31 ENCOUNTER — Other Ambulatory Visit: Payer: Self-pay | Admitting: Certified Registered Nurse Anesthetist

## 2011-10-31 ENCOUNTER — Other Ambulatory Visit: Payer: Self-pay | Admitting: *Deleted

## 2011-10-31 DIAGNOSIS — C189 Malignant neoplasm of colon, unspecified: Secondary | ICD-10-CM

## 2011-10-31 MED ORDER — DIPHENOXYLATE-ATROPINE 2.5-0.025 MG PO TABS: ORAL_TABLET | ORAL | Status: DC

## 2011-10-31 MED ORDER — ONDANSETRON HCL 8 MG PO TABS: 8.0000 mg | ORAL_TABLET | Freq: Two times a day (BID) | ORAL | Status: DC | PRN

## 2011-10-31 NOTE — Progress Notes (Signed)
Per Dr. Truett Perna: OK to add CT chest per patient/husband request. Orders to scheduler.

## 2011-11-03 ENCOUNTER — Telehealth: Payer: Self-pay | Admitting: Oncology

## 2011-11-03 NOTE — Telephone Encounter (Signed)
called central scheduling and added ct chest to appt on 03/15 @ [facility] same time

## 2011-11-05 ENCOUNTER — Other Ambulatory Visit: Payer: Self-pay | Admitting: Oncology

## 2011-11-07 ENCOUNTER — Ambulatory Visit (HOSPITAL_BASED_OUTPATIENT_CLINIC_OR_DEPARTMENT_OTHER): Payer: Medicare HMO

## 2011-11-07 ENCOUNTER — Other Ambulatory Visit: Payer: Medicare HMO | Admitting: Lab

## 2011-11-07 VITALS — BP 121/75 | HR 72 | Temp 97.3°F

## 2011-11-07 DIAGNOSIS — C189 Malignant neoplasm of colon, unspecified: Secondary | ICD-10-CM

## 2011-11-07 DIAGNOSIS — Z5112 Encounter for antineoplastic immunotherapy: Secondary | ICD-10-CM

## 2011-11-07 DIAGNOSIS — Z5111 Encounter for antineoplastic chemotherapy: Secondary | ICD-10-CM

## 2011-11-07 LAB — CBC WITH DIFFERENTIAL/PLATELET
EOS%: 2.9 % (ref 0.0–7.0)
MCH: 31.6 pg (ref 25.1–34.0)
MCHC: 33.6 g/dL (ref 31.5–36.0)
MCV: 94.2 fL (ref 79.5–101.0)
MONO%: 10 % (ref 0.0–14.0)
RBC: 4.02 10*6/uL (ref 3.70–5.45)
RDW: 16.7 % — ABNORMAL HIGH (ref 11.2–14.5)

## 2011-11-07 LAB — COMPREHENSIVE METABOLIC PANEL
AST: 42 U/L — ABNORMAL HIGH (ref 0–37)
Albumin: 3.4 g/dL — ABNORMAL LOW (ref 3.5–5.2)
Alkaline Phosphatase: 74 U/L (ref 39–117)
Potassium: 3.9 mEq/L (ref 3.5–5.3)
Sodium: 137 mEq/L (ref 135–145)
Total Bilirubin: 0.2 mg/dL — ABNORMAL LOW (ref 0.3–1.2)
Total Protein: 7.3 g/dL (ref 6.0–8.3)

## 2011-11-07 MED ORDER — IRINOTECAN HCL CHEMO INJECTION 100 MG/5ML
170.0000 mg/m2 | Freq: Once | INTRAVENOUS | Status: AC
  Administered 2011-11-07: 300 mg via INTRAVENOUS
  Filled 2011-11-07: qty 15

## 2011-11-07 MED ORDER — FLUOROURACIL CHEMO INJECTION 5 GM/100ML
1475.0000 mg/m2 | INTRAVENOUS | Status: DC
  Administered 2011-11-07: 2600 mg via INTRAVENOUS
  Filled 2011-11-07: qty 52

## 2011-11-07 MED ORDER — FLUOROURACIL CHEMO INJECTION 500 MG/10ML
250.0000 mg/m2 | Freq: Once | INTRAVENOUS | Status: AC
  Administered 2011-11-07: 450 mg via INTRAVENOUS
  Filled 2011-11-07: qty 9

## 2011-11-07 MED ORDER — DEXAMETHASONE SODIUM PHOSPHATE 4 MG/ML IJ SOLN
20.0000 mg | Freq: Once | INTRAMUSCULAR | Status: AC
  Administered 2011-11-07: 20 mg via INTRAVENOUS

## 2011-11-07 MED ORDER — ATROPINE SULFATE 1 MG/ML IJ SOLN
0.5000 mg | Freq: Once | INTRAMUSCULAR | Status: AC | PRN
  Administered 2011-11-07: 0.5 mg via INTRAVENOUS

## 2011-11-07 MED ORDER — LEUCOVORIN CALCIUM INJECTION 350 MG
375.0000 mg/m2 | Freq: Once | INTRAVENOUS | Status: AC
  Administered 2011-11-07: 660 mg via INTRAVENOUS
  Filled 2011-11-07: qty 33

## 2011-11-07 MED ORDER — PALONOSETRON HCL INJECTION 0.25 MG/5ML
0.2500 mg | Freq: Once | INTRAVENOUS | Status: AC
  Administered 2011-11-07: 0.25 mg via INTRAVENOUS

## 2011-11-07 MED ORDER — SODIUM CHLORIDE 0.9 % IV SOLN
5.0000 mg/kg | Freq: Once | INTRAVENOUS | Status: AC
  Administered 2011-11-07: 375 mg via INTRAVENOUS
  Filled 2011-11-07: qty 15

## 2011-11-07 MED ORDER — SODIUM CHLORIDE 0.9 % IV SOLN: Freq: Once | INTRAVENOUS | Status: DC

## 2011-11-09 ENCOUNTER — Ambulatory Visit (HOSPITAL_BASED_OUTPATIENT_CLINIC_OR_DEPARTMENT_OTHER): Payer: Medicare HMO

## 2011-11-09 ENCOUNTER — Other Ambulatory Visit: Payer: Self-pay | Admitting: Certified Registered Nurse Anesthetist

## 2011-11-09 VITALS — BP 124/78 | HR 70 | Temp 98.8°F

## 2011-11-09 DIAGNOSIS — C189 Malignant neoplasm of colon, unspecified: Secondary | ICD-10-CM

## 2011-11-09 MED ORDER — SODIUM CHLORIDE 0.9 % IJ SOLN
10.0000 mL | INTRAMUSCULAR | Status: DC | PRN
  Administered 2011-11-09: 10 mL
  Filled 2011-11-09: qty 10

## 2011-11-09 MED ORDER — HEPARIN SOD (PORK) LOCK FLUSH 100 UNIT/ML IV SOLN
500.0000 [IU] | Freq: Once | INTRAVENOUS | Status: AC
  Administered 2011-11-09: 500 [IU]
  Filled 2011-11-09: qty 5

## 2011-11-20 ENCOUNTER — Other Ambulatory Visit: Payer: Self-pay | Admitting: *Deleted

## 2011-11-20 DIAGNOSIS — C189 Malignant neoplasm of colon, unspecified: Secondary | ICD-10-CM

## 2011-11-20 MED ORDER — DIPHENOXYLATE-ATROPINE 2.5-0.025 MG PO TABS: ORAL_TABLET | ORAL | Status: DC

## 2011-11-22 ENCOUNTER — Other Ambulatory Visit: Payer: Self-pay | Admitting: *Deleted

## 2011-11-22 NOTE — Telephone Encounter (Signed)
Patient called reporting for the past two weeks, she is having three to four stools daily.  "As soon as I eat, it comes right out and I take eight lomotil daily.  Is there something else he can prescribe?  Scans on Friday which I'm to drink contrast and am afraid the contrast is going to come right out."  Reports she used to have five to six stools daily so this is better.  Reviewed Eating Hints Book for patients receiving chemotherapy and she is doing well with her meals.  Reports drinking tomato juice which helps settle her stomach.  This also helps with Na+ and K+ levels as well.  Will notify providers and gave her the Radiology number to alert staff of her concerns with contrast.  She is planning to drink the contrast while here because she lives in [place].  Asked if she has tried immodium and she says she recalls being told to take both immodium and lomotil to help with the diarrhea so she will try this.  Will call if further orders.

## 2011-11-24 ENCOUNTER — Other Ambulatory Visit (HOSPITAL_COMMUNITY): Payer: Medicare HMO

## 2011-11-24 ENCOUNTER — Ambulatory Visit (HOSPITAL_COMMUNITY)
Admission: RE | Admit: 2011-11-24 | Discharge: 2011-11-24 | Disposition: A | Discharge status: Home / Self Care | Payer: Medicare HMO | Source: Ambulatory Visit | Attending: Oncology | Admitting: Oncology

## 2011-11-24 DIAGNOSIS — C787 Secondary malignant neoplasm of liver and intrahepatic bile duct: Secondary | ICD-10-CM | POA: Insufficient documentation

## 2011-11-24 DIAGNOSIS — C189 Malignant neoplasm of colon, unspecified: Secondary | ICD-10-CM | POA: Insufficient documentation

## 2011-11-24 DIAGNOSIS — E049 Nontoxic goiter, unspecified: Secondary | ICD-10-CM | POA: Insufficient documentation

## 2011-11-24 MED ORDER — IOHEXOL 300 MG/ML  SOLN
100.0000 mL | Freq: Once | INTRAMUSCULAR | Status: AC | PRN
  Administered 2011-11-24: 100 mL via INTRAVENOUS

## 2011-11-26 ENCOUNTER — Other Ambulatory Visit: Payer: Self-pay | Admitting: Oncology

## 2011-11-28 ENCOUNTER — Ambulatory Visit (HOSPITAL_BASED_OUTPATIENT_CLINIC_OR_DEPARTMENT_OTHER): Payer: Medicare HMO

## 2011-11-28 ENCOUNTER — Encounter: Payer: Self-pay | Admitting: *Deleted

## 2011-11-28 ENCOUNTER — Telehealth: Payer: Self-pay | Admitting: Oncology

## 2011-11-28 ENCOUNTER — Ambulatory Visit (HOSPITAL_BASED_OUTPATIENT_CLINIC_OR_DEPARTMENT_OTHER): Payer: Medicare HMO | Admitting: Physician Assistant

## 2011-11-28 ENCOUNTER — Other Ambulatory Visit (HOSPITAL_BASED_OUTPATIENT_CLINIC_OR_DEPARTMENT_OTHER): Payer: Medicare HMO | Admitting: Lab

## 2011-11-28 ENCOUNTER — Encounter: Payer: Self-pay | Admitting: Physician Assistant

## 2011-11-28 VITALS — BP 96/64 | HR 70 | Temp 97.5°F | Ht 60.0 in | Wt 149.9 lb

## 2011-11-28 DIAGNOSIS — C189 Malignant neoplasm of colon, unspecified: Secondary | ICD-10-CM

## 2011-11-28 DIAGNOSIS — C180 Malignant neoplasm of cecum: Secondary | ICD-10-CM

## 2011-11-28 DIAGNOSIS — Z5111 Encounter for antineoplastic chemotherapy: Secondary | ICD-10-CM

## 2011-11-28 DIAGNOSIS — C787 Secondary malignant neoplasm of liver and intrahepatic bile duct: Secondary | ICD-10-CM

## 2011-11-28 LAB — COMPREHENSIVE METABOLIC PANEL
Albumin: 3.4 g/dL — ABNORMAL LOW (ref 3.5–5.2)
BUN: 14 mg/dL (ref 6–23)
CO2: 29 mEq/L (ref 19–32)
Calcium: 9.6 mg/dL (ref 8.4–10.5)
Chloride: 100 mEq/L (ref 96–112)
Glucose, Bld: 107 mg/dL — ABNORMAL HIGH (ref 70–99)
Potassium: 4 mEq/L (ref 3.5–5.3)
Total Protein: 7.2 g/dL (ref 6.0–8.3)

## 2011-11-28 LAB — CBC WITH DIFFERENTIAL/PLATELET
BASO%: 0.2 % (ref 0.0–2.0)
LYMPH%: 35.9 % (ref 14.0–49.7)
MCHC: 31.4 g/dL — ABNORMAL LOW (ref 31.5–36.0)
MONO#: 0.6 10*3/uL (ref 0.1–0.9)
NEUT#: 2.3 10*3/uL (ref 1.5–6.5)
RBC: 4.24 10*6/uL (ref 3.70–5.45)
RDW: 16.4 % — ABNORMAL HIGH (ref 11.2–14.5)
WBC: 4.9 10*3/uL (ref 3.9–10.3)
lymph#: 1.7 10*3/uL (ref 0.9–3.3)

## 2011-11-28 MED ORDER — SODIUM CHLORIDE 0.9 % IV SOLN
1475.0000 mg/m2 | INTRAVENOUS | Status: DC
  Administered 2011-11-28: 2600 mg via INTRAVENOUS
  Filled 2011-11-28: qty 52

## 2011-11-28 MED ORDER — IRINOTECAN HCL CHEMO INJECTION 100 MG/5ML
170.0000 mg/m2 | Freq: Once | INTRAVENOUS | Status: AC
  Administered 2011-11-28: 300 mg via INTRAVENOUS
  Filled 2011-11-28: qty 15

## 2011-11-28 MED ORDER — SODIUM CHLORIDE 0.9 % IV SOLN
5.0000 mg/kg | Freq: Once | INTRAVENOUS | Status: AC
  Administered 2011-11-28: 375 mg via INTRAVENOUS
  Filled 2011-11-28: qty 15

## 2011-11-28 MED ORDER — DEXAMETHASONE SODIUM PHOSPHATE 4 MG/ML IJ SOLN
20.0000 mg | Freq: Once | INTRAMUSCULAR | Status: AC
  Administered 2011-11-28: 20 mg via INTRAVENOUS

## 2011-11-28 MED ORDER — FLUOROURACIL CHEMO INJECTION 500 MG/10ML
250.0000 mg/m2 | Freq: Once | INTRAVENOUS | Status: AC
  Administered 2011-11-28: 450 mg via INTRAVENOUS
  Filled 2011-11-28: qty 9

## 2011-11-28 MED ORDER — PALONOSETRON HCL INJECTION 0.25 MG/5ML
0.2500 mg | Freq: Once | INTRAVENOUS | Status: AC
  Administered 2011-11-28: 0.25 mg via INTRAVENOUS

## 2011-11-28 MED ORDER — LEUCOVORIN CALCIUM INJECTION 350 MG
375.0000 mg/m2 | Freq: Once | INTRAVENOUS | Status: AC
  Administered 2011-11-28: 660 mg via INTRAVENOUS
  Filled 2011-11-28: qty 33

## 2011-11-28 MED ORDER — SODIUM CHLORIDE 0.9 % IV SOLN
Freq: Once | INTRAVENOUS | Status: AC
  Administered 2011-11-28: 11:00:00 via INTRAVENOUS

## 2011-11-28 MED ORDER — SODIUM CHLORIDE 0.9 % IV SOLN: Freq: Once | INTRAVENOUS | Status: DC

## 2011-11-28 MED ORDER — ATROPINE SULFATE 1 MG/ML IJ SOLN
0.5000 mg | Freq: Once | INTRAMUSCULAR | Status: AC | PRN
  Administered 2011-11-28: 0.5 mg via INTRAVENOUS

## 2011-11-28 NOTE — Progress Notes (Signed)
OFFICE PROGRESS NOTE   INTERVAL HISTORY:   She returns as scheduled. She completed another cycle of chemotherapy on 10/24/2011.  She has mild bleeding when she blows her nose. No other bleeding. She does report more significant diarrhea with this last cycle of chemotherapy. She states the diarrhea started on March 8 and was unrelieved with Lomotil. She also initially had right-sided lower abdominal pain and pressure. This coincide with the start of the worsening diarrhea. She had one episode where the pain lasted 2 hours but was relieved with hydrocodone. She ultimately had control the diarrhea by alternating Lomotil and Imodium. She is now only having one or 2 episodes of diarrhea daily. She's not noticed any blood. She denied any fever or chills. She reports that she recently saw her primary care physician Dr. Lucky Cowboy who changed her thyroid replacement medication dose to 112 mcg daily. No other complaints.  Objective:  Vital signs in last 24 hours:  Blood pressure 96/64, pulse 70, temperature 97.5 F (36.4 C), temperature source Oral, height 5' (1.524 m), weight 149 lb 14.4 oz (67.994 kg).    HEENT: No thrush or ulcers Resp: Clear to auscultation bilaterally no wheezes or rhonchi. Cardio: Regular rate and rhythm GI: No hepatomegaly, mild tenderness in the right abdomen Vascular: No leg edema    Portacath/PICC-without erythema  Lab Results:  Lab Results  Component Value Date   WBC 4.9 11/28/2011   HGB 12.8 11/28/2011   HCT 40.7 11/28/2011   MCV 96.0 11/28/2011   PLT 247 11/28/2011   ANC 4.0  Ct Chest W Contrast  11/24/2011  *RADIOLOGY REPORT*  Clinical Data:  Colon cancer metastasis.  Chemotherapy in progress.  CT CHEST, ABDOMEN AND PELVIS WITH CONTRAST  Technique:  Multidetector CT imaging of the chest, abdomen and pelvis was performed following the standard protocol during bolus administration of intravenous contrast.  Contrast: OMNIPAQUE IOHEXOL 300 MG/ML IJ SOLN   Comparison:  CT 08/15/2011, 10/29/2009  CT CHEST  Findings: No axillary or supraclavicular lymphadenopathy.  2.5 cm lesion within the thyroid gland is increased from 1.3 cm on CT 80/18 2011.  This appears solid.  No axillary supraclavicular lymphadenopathy.  No mediastinal lymphadenopathy.  Review of the lung parenchyma demonstrates no new or suspicious pulmonary nodules.  IMPRESSION:    1.  No evidence of lung metastasis.  2.  Significant increase in size of solid thyroid nodule. Recommend ultrasound and potential biopsy.  CT ABDOMEN AND PELVIS  Findings:  Lesion within the right hepatic lobe measures 3.3 x 2.8 cm decreased from 3.8 x 2.9 cm on prior.  Lesion within the central left hepatic lobe is not as conspicuous also appears decreased measuring 10 mm x 15 mm compared to 20 x 13  mm on prior.  No new hepatic lesions are evident.  Gallbladder is surgically absent. The pancreas, spleen, adrenal glands, and kidneys are normal.  The stomach, small bowel, and colon are unchanged.  There is enteric colonic anastomoses in the right colon  Abdominal aorta normal caliber.  No retroperitoneal lymphadenopathy.  In the pelvis no free fluid.  There is calcified uterine fibroids. No pelvic lymphadenopathy. Review of  bone windows demonstrates no aggressive osseous lesions. Marked sigmoid scoliosis.  IMPRESSION:  1.  Decrease in size of hepatic metastasis.  2.  No evidence of disease progression or new metastasis.  Original Report Authenticated By: Genevive Bi, M.D.   Ct Abdomen Pelvis W Contrast  11/24/2011  *RADIOLOGY REPORT*  Clinical Data:  Colon cancer  metastasis.  Chemotherapy in progress.  CT CHEST, ABDOMEN AND PELVIS WITH CONTRAST  Technique:  Multidetector CT imaging of the chest, abdomen and pelvis was performed following the standard protocol during bolus administration of intravenous contrast.  Contrast: OMNIPAQUE IOHEXOL 300 MG/ML IJ SOLN  Comparison:  CT 08/15/2011, 10/29/2009  CT CHEST  Findings:  No axillary or supraclavicular lymphadenopathy.  2.5 cm lesion within the thyroid gland is increased from 1.3 cm on CT 80/18 2011.  This appears solid.  No axillary supraclavicular lymphadenopathy.  No mediastinal lymphadenopathy.  Review of the lung parenchyma demonstrates no new or suspicious pulmonary nodules.  IMPRESSION:    1.  No evidence of lung metastasis.  2.  Significant increase in size of solid thyroid nodule. Recommend ultrasound and potential biopsy.  CT ABDOMEN AND PELVIS  Findings:  Lesion within the right hepatic lobe measures 3.3 x 2.8 cm decreased from 3.8 x 2.9 cm on prior.  Lesion within the central left hepatic lobe is not as conspicuous also appears decreased measuring 10 mm x 15 mm compared to 20 x 13  mm on prior.  No new hepatic lesions are evident.  Gallbladder is surgically absent. The pancreas, spleen, adrenal glands, and kidneys are normal.  The stomach, small bowel, and colon are unchanged.  There is enteric colonic anastomoses in the right colon  Abdominal aorta normal caliber.  No retroperitoneal lymphadenopathy.  In the pelvis no free fluid.  There is calcified uterine fibroids. No pelvic lymphadenopathy. Review of  bone windows demonstrates no aggressive osseous lesions. Marked sigmoid scoliosis.  IMPRESSION:  1.  Decrease in size of hepatic metastasis.  2.  No evidence of disease progression or new metastasis.  Original Report Authenticated By: Genevive Bi, M.D.   Medications: I have reviewed and updated the patient's current medications.  Assessment/Plan: 1. 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 She completed 12 cycles of FOLFOX chemotherapy. Oxaliplatin was held with cycles number 5, 8, 11, and 12. 2. Metastatic colon cancer confirmed on a restaging CT evaluation 05/02/2011 with liver metastases and periportal lymphadenopathy. She began treatment  with FOLFIRI on 06/01/2011. Avastin was added beginning with cycle number 2. She completed cycle #6 08/08/2011. Restaging CT evaluation 08/15/2011 showed improvement in the liver metastases and porta hepatis lymphadenopathy. She completed cycle #10 FOLFIRI/Avastin 10/10/2011. 3. Status post Port-A-Cath placement and Port-A-Cath removal by Dr. Michaell Cowing. A new Port-A-Cath was placed on 05/16/2011. The Port-A-Cath was removed due to malposition. A new Port-A-Cath was placed on 05/30/2011. 4. Spina bifida with severe scoliosis.  5. Chronic bilateral foot drop/lower leg and foot numbness. 6. Hypothyroidism. 7. History of chemotherapy-induced diarrhea. The bolus and infusional 5-FU were reduced by 25% beginning with cycle number 2 of FOLFOX. The 5-FU dose was decreased by an additional 10% beginning with cycle number 8. 8. History of delayed nausea following chemotherapy-she will contact us if she has significant nausea following this cycle of chemotherapy . 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 The oxaliplatin was dose reduced by 20% beginning with cycle number 6. The neuropathy symptoms resolved. 10. History of neutropenia secondary to chemotherapy. 11. Status post a colonoscopy 05/05/2010. There was a previous right colectomy anastomosis and slight nodularity. Biopsies were taken which showed benign colonic mucosa with hyperplastic epithelial changes. No significant inflammation, granuloma, or adenomatous epithelium was identified. 12. Diarrhea following FOLFIRI chemotherapy. Worse with  this past cycle of chemotherapy. Overall the diarrhea is controlled with Lomotil alternating with Imodium.  13. History of a fungal rash in the groin, improved with Diflucan and nystatin powder. 14. Mouth ulcers, oral candidiasis following cycle #6 of FOLFIRI-the ulcers resolved following treatment with Diflucan   Disposition:  She appears stable. She has completed cycle 12 of FOLFIRI/Avastin. The  patient was discussed with Dr. Truett Perna  reviewed the results of the restaging CT scan. She has shown improvement in the liver metastasis and has no evidence of new or progressive disease. Starting with this cycle of of FOLFIRI/Avastin we will change the frequency to every 3 weeks for the next 4 months. She'll then have another set of restaging CT scans at which time Dr. Truett Perna will make a determination whether to drop the irinotecan from the chemotherapy regimen. Return in 3 weeks and be seen in either least, symmetric tissue or Dr. Truett Perna with a CBC differential, C. met magnesium and urine protein dipstick.  Conni Slipper, PA-C  11/28/2011  11:02 AM

## 2011-11-28 NOTE — Telephone Encounter (Signed)
gv pt appts for april2013 

## 2011-11-29 ENCOUNTER — Telehealth: Payer: Self-pay | Admitting: *Deleted

## 2011-11-29 NOTE — Telephone Encounter (Signed)
Call from pt, was given CT results on 3/19 visit. Has questions about thyroid lesion. Was seen recently by PCP and thyroid med was increased due to lab results. Asking if this needs to be addressed. Reviewed with Dr. Truett Perna: Will monitor for now, in the setting of pt's met. Colon CA. Returned call to pt, she verbalized understanding.

## 2011-11-30 ENCOUNTER — Ambulatory Visit (HOSPITAL_BASED_OUTPATIENT_CLINIC_OR_DEPARTMENT_OTHER): Payer: Medicare HMO

## 2011-11-30 VITALS — BP 122/80 | HR 71 | Temp 98.3°F

## 2011-11-30 DIAGNOSIS — C189 Malignant neoplasm of colon, unspecified: Secondary | ICD-10-CM

## 2011-11-30 DIAGNOSIS — C180 Malignant neoplasm of cecum: Secondary | ICD-10-CM

## 2011-11-30 DIAGNOSIS — Z452 Encounter for adjustment and management of vascular access device: Secondary | ICD-10-CM

## 2011-11-30 MED ORDER — SODIUM CHLORIDE 0.9 % IJ SOLN
10.0000 mL | INTRAMUSCULAR | Status: DC | PRN
  Administered 2011-11-30: 10 mL
  Filled 2011-11-30: qty 10

## 2011-11-30 MED ORDER — HEPARIN SOD (PORK) LOCK FLUSH 100 UNIT/ML IV SOLN
500.0000 [IU] | Freq: Once | INTRAVENOUS | Status: AC
  Administered 2011-11-30: 500 [IU]
  Filled 2011-11-30: qty 5

## 2011-12-12 ENCOUNTER — Other Ambulatory Visit: Payer: Self-pay | Admitting: Certified Registered Nurse Anesthetist

## 2011-12-17 ENCOUNTER — Other Ambulatory Visit: Payer: Self-pay | Admitting: Oncology

## 2011-12-19 ENCOUNTER — Other Ambulatory Visit (HOSPITAL_BASED_OUTPATIENT_CLINIC_OR_DEPARTMENT_OTHER): Payer: Medicare HMO | Admitting: Lab

## 2011-12-19 ENCOUNTER — Ambulatory Visit (HOSPITAL_BASED_OUTPATIENT_CLINIC_OR_DEPARTMENT_OTHER): Payer: Medicare HMO

## 2011-12-19 ENCOUNTER — Ambulatory Visit (HOSPITAL_BASED_OUTPATIENT_CLINIC_OR_DEPARTMENT_OTHER): Payer: Medicare HMO | Admitting: Nurse Practitioner

## 2011-12-19 ENCOUNTER — Telehealth: Payer: Self-pay | Admitting: Oncology

## 2011-12-19 VITALS — BP 117/77 | HR 76 | Temp 97.6°F | Ht 60.0 in | Wt 147.7 lb

## 2011-12-19 DIAGNOSIS — C189 Malignant neoplasm of colon, unspecified: Secondary | ICD-10-CM

## 2011-12-19 DIAGNOSIS — Z5111 Encounter for antineoplastic chemotherapy: Secondary | ICD-10-CM

## 2011-12-19 DIAGNOSIS — C180 Malignant neoplasm of cecum: Secondary | ICD-10-CM

## 2011-12-19 DIAGNOSIS — C787 Secondary malignant neoplasm of liver and intrahepatic bile duct: Secondary | ICD-10-CM

## 2011-12-19 LAB — COMPREHENSIVE METABOLIC PANEL
Albumin: 3.9 g/dL (ref 3.5–5.2)
BUN: 12 mg/dL (ref 6–23)
CO2: 25 mEq/L (ref 19–32)
Calcium: 9.2 mg/dL (ref 8.4–10.5)
Chloride: 107 mEq/L (ref 96–112)
Glucose, Bld: 132 mg/dL — ABNORMAL HIGH (ref 70–99)
Potassium: 4.3 mEq/L (ref 3.5–5.3)
Total Protein: 6.5 g/dL (ref 6.0–8.3)

## 2011-12-19 LAB — CBC WITH DIFFERENTIAL/PLATELET
Basophils Absolute: 0 10*3/uL (ref 0.0–0.1)
Eosinophils Absolute: 0.2 10*3/uL (ref 0.0–0.5)
HCT: 39.6 % (ref 34.8–46.6)
HGB: 13.3 g/dL (ref 11.6–15.9)
MCH: 32.1 pg (ref 25.1–34.0)
MONO#: 0.7 10*3/uL (ref 0.1–0.9)
NEUT#: 2.8 10*3/uL (ref 1.5–6.5)
NEUT%: 51.9 % (ref 38.4–76.8)
WBC: 5.4 10*3/uL (ref 3.9–10.3)
lymph#: 1.7 10*3/uL (ref 0.9–3.3)

## 2011-12-19 LAB — UA PROTEIN, DIPSTICK - CHCC: Protein, ur: NEGATIVE mg/dL

## 2011-12-19 MED ORDER — LEUCOVORIN CALCIUM INJECTION 350 MG
375.0000 mg/m2 | Freq: Once | INTRAVENOUS | Status: AC
  Administered 2011-12-19: 660 mg via INTRAVENOUS
  Filled 2011-12-19: qty 33

## 2011-12-19 MED ORDER — SODIUM CHLORIDE 0.9 % IV SOLN
1475.0000 mg/m2 | INTRAVENOUS | Status: DC
  Administered 2011-12-19: 2600 mg via INTRAVENOUS
  Filled 2011-12-19: qty 52

## 2011-12-19 MED ORDER — ATROPINE SULFATE 1 MG/ML IJ SOLN
0.5000 mg | Freq: Once | INTRAMUSCULAR | Status: AC | PRN
  Administered 2011-12-19: 0.5 mg via INTRAVENOUS

## 2011-12-19 MED ORDER — DEXAMETHASONE SODIUM PHOSPHATE 4 MG/ML IJ SOLN
20.0000 mg | Freq: Once | INTRAMUSCULAR | Status: AC
  Administered 2011-12-19: 20 mg via INTRAVENOUS

## 2011-12-19 MED ORDER — SODIUM CHLORIDE 0.9 % IV SOLN
325.0000 mg | Freq: Once | INTRAVENOUS | Status: AC
  Administered 2011-12-19: 325 mg via INTRAVENOUS
  Filled 2011-12-19: qty 13

## 2011-12-19 MED ORDER — PALONOSETRON HCL INJECTION 0.25 MG/5ML
0.2500 mg | Freq: Once | INTRAVENOUS | Status: AC
  Administered 2011-12-19: 0.25 mg via INTRAVENOUS

## 2011-12-19 MED ORDER — IRINOTECAN HCL CHEMO INJECTION 100 MG/5ML
170.0000 mg/m2 | Freq: Once | INTRAVENOUS | Status: AC
  Administered 2011-12-19: 300 mg via INTRAVENOUS
  Filled 2011-12-19: qty 15

## 2011-12-19 MED ORDER — FLUOROURACIL CHEMO INJECTION 500 MG/10ML
250.0000 mg/m2 | Freq: Once | INTRAVENOUS | Status: AC
  Administered 2011-12-19: 450 mg via INTRAVENOUS
  Filled 2011-12-19: qty 9

## 2011-12-19 NOTE — Telephone Encounter (Signed)
Pt is aware she will get her April,may 2013 appt calendars from the  Infusion room

## 2011-12-19 NOTE — Progress Notes (Signed)
OFFICE PROGRESS NOTE  Interval history:  [name] is a 56 year old woman with metastatic colon cancer. She is being treated with FOLFIRI/Avastin on a 3 week schedule. Restaging CT evaluation 11/24/2011 showed improvement in liver metastases. She is seen today for scheduled followup.  [name] reports that overall she feels well. She had less diarrhea following the most recent cycle of chemotherapy. Shakes Lomotil as needed. She has mild intermittent nausea. No vomiting. She denies abdominal pain. No shortness of breath or chest pain. She recently noted a small amount of blood from the rectum. She feels that the blood was due to "hemorrhoids".   Objective: There were no vitals taken for this visit.  Oropharynx is without thrush or ulceration. Lungs are clear. Regular cardiac rhythm. Abdomen is soft and nontender. No hepatomegaly. Trace edema bilateral lower extremities below the knees.  Lab Results: Lab Results  Component Value Date   WBC 5.4 12/19/2011   HGB 13.3 12/19/2011   HCT 39.6 12/19/2011   MCV 95.6 12/19/2011   PLT 236 12/19/2011    Chemistry:    Chemistry      Component Value Date/Time   NA 137 11/28/2011 0933   NA 136 05/02/2011 0954   K 4.0 11/28/2011 0933   K 4.5 05/02/2011 0954   CL 100 11/28/2011 0933   CL 93* 05/02/2011 0954   CO2 29 11/28/2011 0933   CO2 29 05/02/2011 0954   BUN 14 11/28/2011 0933   BUN 12 05/02/2011 0954   CREATININE 0.53 11/28/2011 0933   CREATININE 0.5* 05/02/2011 0954      Component Value Date/Time   CALCIUM 9.6 11/28/2011 0933   CALCIUM 9.3 05/02/2011 0954   ALKPHOS 77 11/28/2011 0933   ALKPHOS 78 05/02/2011 0954   AST 44* 11/28/2011 0933   AST 31 05/02/2011 0954   ALT 31 11/28/2011 0933   BILITOT 0.2* 11/28/2011 0933   BILITOT 0.40 05/02/2011 0954       Studies/Results: Ct Chest W Contrast  11/24/2011  *RADIOLOGY REPORT*  Clinical Data:  Colon cancer metastasis.  Chemotherapy in progress.  CT CHEST, ABDOMEN AND PELVIS WITH CONTRAST  Technique:   Multidetector CT imaging of the chest, abdomen and pelvis was performed following the standard protocol during bolus administration of intravenous contrast.  Contrast: OMNIPAQUE IOHEXOL 300 MG/ML IJ SOLN  Comparison:  CT 08/15/2011, 10/29/2009  CT CHEST  Findings: No axillary or supraclavicular lymphadenopathy.  2.5 cm lesion within the thyroid gland is increased from 1.3 cm on CT 80/18 2011.  This appears solid.  No axillary supraclavicular lymphadenopathy.  No mediastinal lymphadenopathy.  Review of the lung parenchyma demonstrates no new or suspicious pulmonary nodules.  IMPRESSION:    1.  No evidence of lung metastasis.  2.  Significant increase in size of solid thyroid nodule. Recommend ultrasound and potential biopsy.  CT ABDOMEN AND PELVIS  Findings:  Lesion within the right hepatic lobe measures 3.3 x 2.8 cm decreased from 3.8 x 2.9 cm on prior.  Lesion within the central left hepatic lobe is not as conspicuous also appears decreased measuring 10 mm x 15 mm compared to 20 x 13  mm on prior.  No new hepatic lesions are evident.  Gallbladder is surgically absent. The pancreas, spleen, adrenal glands, and kidneys are normal.  The stomach, small bowel, and colon are unchanged.  There is enteric colonic anastomoses in the right colon  Abdominal aorta normal caliber.  No retroperitoneal lymphadenopathy.  In the pelvis no free fluid.  There  is calcified uterine fibroids. No pelvic lymphadenopathy. Review of  bone windows demonstrates no aggressive osseous lesions. Marked sigmoid scoliosis.  IMPRESSION:  1.  Decrease in size of hepatic metastasis.  2.  No evidence of disease progression or new metastasis.  Original Report Authenticated By: Genevive Bi, M.D.   Ct Abdomen Pelvis W Contrast  11/24/2011  *RADIOLOGY REPORT*  Clinical Data:  Colon cancer metastasis.  Chemotherapy in progress.  CT CHEST, ABDOMEN AND PELVIS WITH CONTRAST  Technique:  Multidetector CT imaging of the chest, abdomen and pelvis was  performed following the standard protocol during bolus administration of intravenous contrast.  Contrast: OMNIPAQUE IOHEXOL 300 MG/ML IJ SOLN  Comparison:  CT 08/15/2011, 10/29/2009  CT CHEST  Findings: No axillary or supraclavicular lymphadenopathy.  2.5 cm lesion within the thyroid gland is increased from 1.3 cm on CT 80/18 2011.  This appears solid.  No axillary supraclavicular lymphadenopathy.  No mediastinal lymphadenopathy.  Review of the lung parenchyma demonstrates no new or suspicious pulmonary nodules.  IMPRESSION:    1.  No evidence of lung metastasis.  2.  Significant increase in size of solid thyroid nodule. Recommend ultrasound and potential biopsy.  CT ABDOMEN AND PELVIS  Findings:  Lesion within the right hepatic lobe measures 3.3 x 2.8 cm decreased from 3.8 x 2.9 cm on prior.  Lesion within the central left hepatic lobe is not as conspicuous also appears decreased measuring 10 mm x 15 mm compared to 20 x 13  mm on prior.  No new hepatic lesions are evident.  Gallbladder is surgically absent. The pancreas, spleen, adrenal glands, and kidneys are normal.  The stomach, small bowel, and colon are unchanged.  There is enteric colonic anastomoses in the right colon  Abdominal aorta normal caliber.  No retroperitoneal lymphadenopathy.  In the pelvis no free fluid.  There is calcified uterine fibroids. No pelvic lymphadenopathy. Review of  bone windows demonstrates no aggressive osseous lesions. Marked sigmoid scoliosis.  IMPRESSION:  1.  Decrease in size of hepatic metastasis.  2.  No evidence of disease progression or new metastasis.  Original Report Authenticated By: Genevive Bi, M.D.    Medications: I have reviewed the patient's current medications.  Assessment/Plan:  1. 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 She completed 12 cycles of FOLFOX chemotherapy.  Oxaliplatin was held with cycles number 5, 8, 11, and 12. 2. Metastatic colon cancer confirmed on a restaging CT evaluation 05/02/2011 with liver metastases and periportal lymphadenopathy. She began treatment with FOLFIRI on 06/01/2011. Avastin was added beginning with cycle number 2. She completed cycle #6 08/08/2011. Restaging CT evaluation 08/15/2011 showed improvement in the liver metastases and porta hepatis lymphadenopathy. She completed cycle #12 FOLFIRI/Avastin 11/07/2011. Restaging CT evaluation 11/24/2011 showed decrease in size of hepatic metastases and no evidence of disease progression or new metastasis. 3. Status post Port-A-Cath placement and Port-A-Cath removal by Dr. Michaell Cowing. A new Port-A-Cath was placed on 05/16/2011. The Port-A-Cath was removed due to malposition. A new Port-A-Cath was placed on 05/30/2011. 4. Spina bifida with severe scoliosis.  5. Chronic bilateral foot drop/lower leg and foot numbness. 6. Hypothyroidism. 7. History of chemotherapy-induced diarrhea. The bolus and infusional 5-FU were reduced by 25% beginning with cycle number 2 of FOLFOX. The 5-FU dose was decreased by an additional 10% beginning with cycle number 8. 8. History of delayed nausea following chemotherapy. 9.  History of oxaliplatin neuropathy affecting the fingertips. The oxaliplatin was dose reduced by 20% beginning with cycle number 6. The neuropathy symptoms resolved. 10. History of neutropenia secondary to chemotherapy. 11. Status post a colonoscopy 05/05/2010. There was a previous right colectomy anastomosis and slight nodularity. Biopsies were taken which showed benign colonic mucosa with hyperplastic epithelial changes. No significant inflammation, granuloma, or adenomatous epithelium was identified. 12. Diarrhea following FOLFIRI chemotherapy. Overall the diarrhea is controlled with Lomotil.  13. History of a fungal rash in the groin, improved with Diflucan and nystatin powder. 14. Mouth ulcers,  oral candidiasis following cycle #6 of FOLFIRI-the ulcers resolved following treatment with Diflucan.  Disposition-Ms. Perham appears stable. Plan to continue FOLFIRI/Avastin on a 3 week schedule. She is due for treatment today. She will return for a followup visit and cycle 14 in 3 weeks. She will contact the office in the interim with any problems.  Lonna Cobb ANP/GNP-BC

## 2011-12-21 ENCOUNTER — Ambulatory Visit (HOSPITAL_BASED_OUTPATIENT_CLINIC_OR_DEPARTMENT_OTHER): Payer: Medicare HMO

## 2011-12-21 ENCOUNTER — Other Ambulatory Visit: Payer: Self-pay | Admitting: *Deleted

## 2011-12-21 VITALS — BP 121/74 | HR 72 | Temp 97.3°F

## 2011-12-21 DIAGNOSIS — C189 Malignant neoplasm of colon, unspecified: Secondary | ICD-10-CM

## 2011-12-21 MED ORDER — SODIUM CHLORIDE 0.9 % IJ SOLN
10.0000 mL | INTRAMUSCULAR | Status: DC | PRN
  Administered 2011-12-21: 10 mL
  Filled 2011-12-21: qty 10

## 2011-12-21 MED ORDER — HEPARIN SOD (PORK) LOCK FLUSH 100 UNIT/ML IV SOLN
500.0000 [IU] | Freq: Once | INTRAVENOUS | Status: AC
  Administered 2011-12-21: 500 [IU]
  Filled 2011-12-21: qty 5

## 2011-12-21 MED ORDER — LORAZEPAM 0.5 MG PO TABS: 0.5000 mg | ORAL_TABLET | Freq: Three times a day (TID) | ORAL | Status: DC

## 2011-12-25 ENCOUNTER — Other Ambulatory Visit: Payer: Self-pay | Admitting: Certified Registered Nurse Anesthetist

## 2012-01-08 ENCOUNTER — Other Ambulatory Visit: Payer: Self-pay | Admitting: Oncology

## 2012-01-09 ENCOUNTER — Ambulatory Visit (HOSPITAL_BASED_OUTPATIENT_CLINIC_OR_DEPARTMENT_OTHER): Payer: Medicare HMO

## 2012-01-09 ENCOUNTER — Telehealth: Payer: Self-pay | Admitting: Oncology

## 2012-01-09 ENCOUNTER — Other Ambulatory Visit (HOSPITAL_BASED_OUTPATIENT_CLINIC_OR_DEPARTMENT_OTHER): Payer: Medicare HMO | Admitting: Lab

## 2012-01-09 ENCOUNTER — Ambulatory Visit (HOSPITAL_BASED_OUTPATIENT_CLINIC_OR_DEPARTMENT_OTHER): Payer: Medicare HMO | Admitting: Nurse Practitioner

## 2012-01-09 VITALS — BP 107/67 | HR 73 | Temp 97.8°F | Ht 60.0 in | Wt 147.2 lb

## 2012-01-09 DIAGNOSIS — C187 Malignant neoplasm of sigmoid colon: Secondary | ICD-10-CM

## 2012-01-09 DIAGNOSIS — C772 Secondary and unspecified malignant neoplasm of intra-abdominal lymph nodes: Secondary | ICD-10-CM

## 2012-01-09 DIAGNOSIS — C189 Malignant neoplasm of colon, unspecified: Secondary | ICD-10-CM

## 2012-01-09 DIAGNOSIS — C787 Secondary malignant neoplasm of liver and intrahepatic bile duct: Secondary | ICD-10-CM

## 2012-01-09 DIAGNOSIS — Z5111 Encounter for antineoplastic chemotherapy: Secondary | ICD-10-CM

## 2012-01-09 DIAGNOSIS — Z5112 Encounter for antineoplastic immunotherapy: Secondary | ICD-10-CM

## 2012-01-09 DIAGNOSIS — R197 Diarrhea, unspecified: Secondary | ICD-10-CM

## 2012-01-09 LAB — CBC WITH DIFFERENTIAL/PLATELET
Basophils Absolute: 0 10*3/uL (ref 0.0–0.1)
Eosinophils Absolute: 0.2 10*3/uL (ref 0.0–0.5)
HCT: 39.5 % (ref 34.8–46.6)
LYMPH%: 40.2 % (ref 14.0–49.7)
MCV: 96.5 fL (ref 79.5–101.0)
MONO#: 0.6 10*3/uL (ref 0.1–0.9)
MONO%: 13.3 % (ref 0.0–14.0)
NEUT#: 1.9 10*3/uL (ref 1.5–6.5)
NEUT%: 42.6 % (ref 38.4–76.8)
Platelets: 221 10*3/uL (ref 145–400)
RBC: 4.09 10*6/uL (ref 3.70–5.45)
WBC: 4.6 10*3/uL (ref 3.9–10.3)
nRBC: 0 % (ref 0–0)

## 2012-01-09 LAB — COMPREHENSIVE METABOLIC PANEL
ALT: 32 U/L (ref 0–35)
BUN: 12 mg/dL (ref 6–23)
CO2: 26 mEq/L (ref 19–32)
Calcium: 9.7 mg/dL (ref 8.4–10.5)
Chloride: 102 mEq/L (ref 96–112)
Creatinine, Ser: 0.42 mg/dL — ABNORMAL LOW (ref 0.50–1.10)
Glucose, Bld: 136 mg/dL — ABNORMAL HIGH (ref 70–99)
Total Bilirubin: 0.2 mg/dL — ABNORMAL LOW (ref 0.3–1.2)

## 2012-01-09 LAB — UA PROTEIN, DIPSTICK - CHCC: Protein, ur: NEGATIVE mg/dL

## 2012-01-09 MED ORDER — FLUOROURACIL CHEMO INJECTION 5 GM/100ML
1475.0000 mg/m2 | INTRAVENOUS | Status: DC
  Administered 2012-01-09: 2600 mg via INTRAVENOUS
  Filled 2012-01-09: qty 52

## 2012-01-09 MED ORDER — SODIUM CHLORIDE 0.9 % IV SOLN
Freq: Once | INTRAVENOUS | Status: AC
  Administered 2012-01-09: 10:00:00 via INTRAVENOUS

## 2012-01-09 MED ORDER — LEUCOVORIN CALCIUM INJECTION 350 MG
375.0000 mg/m2 | Freq: Once | INTRAVENOUS | Status: AC
  Administered 2012-01-09: 660 mg via INTRAVENOUS
  Filled 2012-01-09: qty 33

## 2012-01-09 MED ORDER — DEXAMETHASONE SODIUM PHOSPHATE 4 MG/ML IJ SOLN
20.0000 mg | Freq: Once | INTRAMUSCULAR | Status: AC
  Administered 2012-01-09: 20 mg via INTRAVENOUS

## 2012-01-09 MED ORDER — IRINOTECAN HCL CHEMO INJECTION 100 MG/5ML
170.0000 mg/m2 | Freq: Once | INTRAVENOUS | Status: AC
  Administered 2012-01-09: 300 mg via INTRAVENOUS
  Filled 2012-01-09: qty 15

## 2012-01-09 MED ORDER — FLUOROURACIL CHEMO INJECTION 500 MG/10ML
250.0000 mg/m2 | Freq: Once | INTRAVENOUS | Status: AC
  Administered 2012-01-09: 450 mg via INTRAVENOUS
  Filled 2012-01-09: qty 9

## 2012-01-09 MED ORDER — PALONOSETRON HCL INJECTION 0.25 MG/5ML
0.2500 mg | Freq: Once | INTRAVENOUS | Status: AC
  Administered 2012-01-09: 0.25 mg via INTRAVENOUS

## 2012-01-09 MED ORDER — SODIUM CHLORIDE 0.9 % IV SOLN
5.0000 mg/kg | Freq: Once | INTRAVENOUS | Status: AC
  Administered 2012-01-09: 325 mg via INTRAVENOUS
  Filled 2012-01-09: qty 13

## 2012-01-09 MED ORDER — ATROPINE SULFATE 0.4 MG/ML IJ SOLN
0.5000 mg | Freq: Once | INTRAMUSCULAR | Status: AC | PRN
  Administered 2012-01-09: 0.52 mg via INTRAVENOUS
  Filled 2012-01-09: qty 1.25

## 2012-01-09 NOTE — Progress Notes (Signed)
OFFICE PROGRESS NOTE  Interval history:  April Spence returns as scheduled. She completed cycle 13 FOLFIRI/Avastin on 12/19/2011. She had less diarrhea than with previous cycles. She continues Lomotil as needed. She denies mouth sores. She did note that her gums became "sensitive". She had mild nausea. No vomiting. She continues to intermittently note bleeding with nose blowing. She denies shortness of breath or chest pain   Objective: Blood pressure 107/67, pulse 73, temperature 97.8 F (36.6 C), temperature source Oral, height 5' (1.524 m), weight 147 lb 3.2 oz (66.769 kg).  Oropharynx is without thrush or ulceration. Lungs are clear. Regular cardiac rhythm. Port-A-Cath site is without erythema. Abdomen is soft and nontender. Trace edema below the knees.  Lab Results: Lab Results  Component Value Date   WBC 4.6 01/09/2012   HGB 13.2 01/09/2012   HCT 39.5 01/09/2012   MCV 96.5 01/09/2012   PLT 221 01/09/2012    Chemistry:    Chemistry      Component Value Date/Time   NA 141 12/19/2011 1134   NA 136 05/02/2011 0954   K 4.3 12/19/2011 1134   K 4.5 05/02/2011 0954   CL 107 12/19/2011 1134   CL 93* 05/02/2011 0954   CO2 25 12/19/2011 1134   CO2 29 05/02/2011 0954   BUN 12 12/19/2011 1134   BUN 12 05/02/2011 0954   CREATININE 0.46* 12/19/2011 1134   CREATININE 0.5* 05/02/2011 0954      Component Value Date/Time   CALCIUM 9.2 12/19/2011 1134   CALCIUM 9.3 05/02/2011 0954   ALKPHOS 71 12/19/2011 1134   ALKPHOS 78 05/02/2011 0954   AST 54* 12/19/2011 1134   AST 31 05/02/2011 0954   ALT 39* 12/19/2011 1134   BILITOT 0.2* 12/19/2011 1134   BILITOT 0.40 05/02/2011 0954       Studies/Results: No results found.  Medications: I have reviewed the patient's current medications.  Assessment/Plan:  1. 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 She completed 12 cycles of FOLFOX chemotherapy.  Oxaliplatin was held with cycles number 5, 8, 11, and 12. 2. Metastatic colon cancer confirmed on a restaging CT evaluation 05/02/2011 with liver metastases and periportal lymphadenopathy. She began treatment with FOLFIRI on 06/01/2011. Avastin was added beginning with cycle number 2. She completed cycle #6 08/08/2011. Restaging CT evaluation 08/15/2011 showed improvement in the liver metastases and porta hepatis lymphadenopathy. She completed cycle #12 FOLFIRI/Avastin 11/07/2011. Restaging CT evaluation 11/24/2011 showed decrease in size of hepatic metastases and no evidence of disease progression or new metastasis. She completed cycle 13 on 12/19/2011. 3. Status post Port-A-Cath placement and Port-A-Cath removal by Dr. Michaell Cowing. A new Port-A-Cath was placed on 05/16/2011. The Port-A-Cath was removed due to malposition. A new Port-A-Cath was placed on 05/30/2011. 4. Spina bifida with severe scoliosis.  5. Chronic bilateral foot drop/lower leg and foot numbness. 6. Hypothyroidism. 7. History of chemotherapy-induced diarrhea. The bolus and infusional 5-FU were reduced by 25% beginning with cycle number 2 of FOLFOX. The 5-FU dose was decreased by an additional 10% beginning with cycle number 8. 8. History of delayed nausea following chemotherapy. 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 The oxaliplatin was dose reduced by 20% beginning with cycle number 6. The neuropathy symptoms resolved. 10. History of neutropenia secondary to chemotherapy. 11. Status post a colonoscopy 05/05/2010. There was a previous right colectomy anastomosis and slight nodularity. Biopsies  were taken which showed benign colonic mucosa with hyperplastic epithelial changes. No significant inflammation, granuloma, or adenomatous epithelium was identified. 12. Diarrhea following FOLFIRI chemotherapy. Overall the diarrhea is controlled with Lomotil.  13. History of a fungal rash in the groin, improved with Diflucan and  nystatin powder. 14. Mouth ulcers, oral candidiasis following cycle #6 of FOLFIRI-the ulcers resolved following treatment with Diflucan.  Disposition-Ms. Kooy appears stable. Plan to proceed with treatment today as scheduled. She will return for a followup visit and cycle 15 in 3 weeks. She will contact the office in the interim with any problems.  Plan reviewed with Dr. Truett Perna.     Lonna Cobb ANP/GNP-BC

## 2012-01-09 NOTE — Patient Instructions (Signed)
Pt and spouse given instructions to call with concerns and questions.  Pt discharged home in wheelchair with spouse. Appt for Thursday made.

## 2012-01-09 NOTE — Telephone Encounter (Signed)
gv pts husband appts for [id number]

## 2012-01-11 ENCOUNTER — Ambulatory Visit (HOSPITAL_BASED_OUTPATIENT_CLINIC_OR_DEPARTMENT_OTHER): Payer: Medicare HMO

## 2012-01-11 VITALS — BP 115/78 | HR 71 | Temp 98.4°F

## 2012-01-11 DIAGNOSIS — C189 Malignant neoplasm of colon, unspecified: Secondary | ICD-10-CM

## 2012-01-11 DIAGNOSIS — C787 Secondary malignant neoplasm of liver and intrahepatic bile duct: Secondary | ICD-10-CM

## 2012-01-11 DIAGNOSIS — Z452 Encounter for adjustment and management of vascular access device: Secondary | ICD-10-CM

## 2012-01-11 DIAGNOSIS — C180 Malignant neoplasm of cecum: Secondary | ICD-10-CM

## 2012-01-11 MED ORDER — SODIUM CHLORIDE 0.9 % IJ SOLN
10.0000 mL | INTRAMUSCULAR | Status: DC | PRN
  Administered 2012-01-11: 10 mL
  Filled 2012-01-11: qty 10

## 2012-01-11 MED ORDER — HEPARIN SOD (PORK) LOCK FLUSH 100 UNIT/ML IV SOLN
500.0000 [IU] | Freq: Once | INTRAVENOUS | Status: AC
  Administered 2012-01-11: 500 [IU]
  Filled 2012-01-11: qty 5

## 2012-01-11 NOTE — Patient Instructions (Signed)
Call MD for problems 

## 2012-01-15 ENCOUNTER — Other Ambulatory Visit: Payer: Self-pay | Admitting: Certified Registered Nurse Anesthetist

## 2012-01-26 ENCOUNTER — Other Ambulatory Visit: Payer: Self-pay | Admitting: *Deleted

## 2012-01-26 DIAGNOSIS — C189 Malignant neoplasm of colon, unspecified: Secondary | ICD-10-CM

## 2012-01-26 MED ORDER — LORAZEPAM 0.5 MG PO TABS: 0.5000 mg | ORAL_TABLET | Freq: Two times a day (BID) | ORAL | Status: DC | PRN

## 2012-01-29 ENCOUNTER — Other Ambulatory Visit: Payer: Self-pay | Admitting: Oncology

## 2012-01-30 ENCOUNTER — Ambulatory Visit (HOSPITAL_BASED_OUTPATIENT_CLINIC_OR_DEPARTMENT_OTHER): Payer: Medicare HMO

## 2012-01-30 ENCOUNTER — Ambulatory Visit (HOSPITAL_BASED_OUTPATIENT_CLINIC_OR_DEPARTMENT_OTHER): Payer: Medicare HMO | Admitting: Oncology

## 2012-01-30 ENCOUNTER — Other Ambulatory Visit (HOSPITAL_BASED_OUTPATIENT_CLINIC_OR_DEPARTMENT_OTHER): Payer: Medicare HMO | Admitting: Lab

## 2012-01-30 ENCOUNTER — Telehealth: Payer: Self-pay | Admitting: Oncology

## 2012-01-30 VITALS — BP 125/79 | HR 73 | Temp 97.6°F | Ht 60.0 in | Wt 146.4 lb

## 2012-01-30 DIAGNOSIS — C180 Malignant neoplasm of cecum: Secondary | ICD-10-CM

## 2012-01-30 DIAGNOSIS — C189 Malignant neoplasm of colon, unspecified: Secondary | ICD-10-CM

## 2012-01-30 DIAGNOSIS — C787 Secondary malignant neoplasm of liver and intrahepatic bile duct: Secondary | ICD-10-CM

## 2012-01-30 DIAGNOSIS — Z5111 Encounter for antineoplastic chemotherapy: Secondary | ICD-10-CM

## 2012-01-30 LAB — CBC WITH DIFFERENTIAL/PLATELET
BASO%: 0.4 % (ref 0.0–2.0)
EOS%: 3 % (ref 0.0–7.0)
LYMPH%: 40 % (ref 14.0–49.7)
MCH: 32.2 pg (ref 25.1–34.0)
MCHC: 33.2 g/dL (ref 31.5–36.0)
MONO#: 0.6 10*3/uL (ref 0.1–0.9)
Platelets: 207 10*3/uL (ref 145–400)
RBC: 3.93 10*6/uL (ref 3.70–5.45)
WBC: 4.3 10*3/uL (ref 3.9–10.3)
nRBC: 0 % (ref 0–0)

## 2012-01-30 LAB — COMPREHENSIVE METABOLIC PANEL
ALT: 29 U/L (ref 0–35)
AST: 36 U/L (ref 0–37)
Alkaline Phosphatase: 65 U/L (ref 39–117)
BUN: 12 mg/dL (ref 6–23)
Calcium: 9.5 mg/dL (ref 8.4–10.5)
Chloride: 104 mEq/L (ref 96–112)
Creatinine, Ser: 0.41 mg/dL — ABNORMAL LOW (ref 0.50–1.10)
Total Bilirubin: 0.2 mg/dL — ABNORMAL LOW (ref 0.3–1.2)

## 2012-01-30 MED ORDER — LEUCOVORIN CALCIUM INJECTION 350 MG
375.0000 mg/m2 | Freq: Once | INTRAVENOUS | Status: AC
  Administered 2012-01-30: 660 mg via INTRAVENOUS
  Filled 2012-01-30: qty 33

## 2012-01-30 MED ORDER — PALONOSETRON HCL INJECTION 0.25 MG/5ML
0.2500 mg | Freq: Once | INTRAVENOUS | Status: AC
  Administered 2012-01-30: 0.25 mg via INTRAVENOUS

## 2012-01-30 MED ORDER — IRINOTECAN HCL CHEMO INJECTION 100 MG/5ML
170.0000 mg/m2 | Freq: Once | INTRAVENOUS | Status: AC
  Administered 2012-01-30: 300 mg via INTRAVENOUS
  Filled 2012-01-30: qty 15

## 2012-01-30 MED ORDER — SODIUM CHLORIDE 0.9 % IV SOLN
Freq: Once | INTRAVENOUS | Status: AC
  Administered 2012-01-30: 10:00:00 via INTRAVENOUS

## 2012-01-30 MED ORDER — FLUOROURACIL CHEMO INJECTION 500 MG/10ML
250.0000 mg/m2 | Freq: Once | INTRAVENOUS | Status: AC
  Administered 2012-01-30: 450 mg via INTRAVENOUS
  Filled 2012-01-30: qty 9

## 2012-01-30 MED ORDER — ATROPINE SULFATE 0.4 MG/ML IJ SOLN
0.5000 mg | Freq: Once | INTRAMUSCULAR | Status: AC | PRN
  Administered 2012-01-30: 0.5 mg via INTRAVENOUS
  Filled 2012-01-30: qty 1.25

## 2012-01-30 MED ORDER — SODIUM CHLORIDE 0.9 % IV SOLN
1475.0000 mg/m2 | INTRAVENOUS | Status: DC
  Administered 2012-01-30: 2600 mg via INTRAVENOUS
  Filled 2012-01-30: qty 52

## 2012-01-30 MED ORDER — SODIUM CHLORIDE 0.9 % IV SOLN
5.0000 mg/kg | Freq: Once | INTRAVENOUS | Status: AC
  Administered 2012-01-30: 325 mg via INTRAVENOUS
  Filled 2012-01-30: qty 13

## 2012-01-30 MED ORDER — DEXAMETHASONE SODIUM PHOSPHATE 4 MG/ML IJ SOLN
20.0000 mg | Freq: Once | INTRAMUSCULAR | Status: AC
  Administered 2012-01-30: 20 mg via INTRAVENOUS

## 2012-01-30 NOTE — Progress Notes (Signed)
   [facility]    OFFICE PROGRESS NOTE   INTERVAL HISTORY:   She completed another cycle of chemotherapy on 01/09/2012. She reports malaise for several days following chemotherapy. She continues to have intermittent diarrhea. She had nausea following chemotherapy but no vomiting. She has an occasional "twinge "of discomfort at the low anterior left chest with certain movements. No shortness of breath or cough.  Objective:  Vital signs in last 24 hours:  Blood pressure 125/79, pulse 73, temperature 97.6 F (36.4 C), temperature source Oral, height 5' (1.524 m), weight 146 lb 6.4 oz (66.407 kg).    HEENT: No thrush or ulcers Resp: Inspiratory rhonchi at the right greater than left lower chest. No respiratory distress Cardio: Regular rate and rhythm GI: No hepatosplenomegaly, mild tenderness in the mid and left upper abdomen. No mass. Vascular: Stable mild edema at the low leg bilaterally   Portacath/PICC-without erythema  Lab Results:  Lab Results  Component Value Date   WBC 4.3 01/30/2012   HGB 12.7 01/30/2012   HCT 38.1 01/30/2012   MCV 97.0 01/30/2012   PLT 207 01/30/2012   ANC 1.8    Medications: I have reviewed the patient's current medications.  Assessment/Plan: 1. 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 She completed 12 cycles of FOLFOX chemotherapy. Oxaliplatin was held with cycles number 5, 8, 11, and 12. 2. Metastatic colon cancer confirmed on a restaging CT evaluation 05/02/2011 with liver metastases and periportal lymphadenopathy. She began treatment with FOLFIRI on 06/01/2011. Avastin was added beginning with cycle number 2. She completed cycle #6 08/08/2011. Restaging CT evaluation 08/15/2011 showed improvement in the liver metastases and porta hepatis lymphadenopathy. She completed cycle #12 FOLFIRI/Avastin 11/07/2011. Restaging  CT evaluation 11/24/2011 showed decrease in size of hepatic metastases and no evidence of disease progression or new metastasis. She completed cycle 14 on 01/09/2012. 3. Status post Port-A-Cath placement and Port-A-Cath removal by Dr. Michaell Cowing. A new Port-A-Cath was placed on 05/16/2011. The Port-A-Cath was removed due to malposition. A new Port-A-Cath was placed on 05/30/2011. 4. Spina bifida with severe scoliosis.  5. Chronic bilateral foot drop/lower leg and foot numbness. 6. Hypothyroidism. 7. History of chemotherapy-induced diarrhea. The bolus and infusional 5-FU were reduced by 25% beginning with cycle number 2 of FOLFOX. The 5-FU dose was decreased by an additional 10% beginning with cycle number 8. 8. History of delayed nausea following chemotherapy. 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 The oxaliplatin was dose reduced by 20% beginning with cycle number 6. The neuropathy symptoms resolved. 10. History of neutropenia secondary to chemotherapy. 11. Status post a colonoscopy 05/05/2010. There was a previous right colectomy anastomosis and slight nodularity. Biopsies were taken which showed benign colonic mucosa with hyperplastic epithelial changes. No significant inflammation, granuloma, or adenomatous epithelium was identified. 12. Diarrhea following FOLFIRI chemotherapy. Overall the diarrhea is controlled with Lomotil.  13. History of a fungal rash in the groin, improved with Diflucan and nystatin powder. 14. Mouth ulcers, oral candidiasis following cycle #6 of FOLFIRI-the ulcers resolved following treatment with Diflucan.   Disposition:  She appears stable. She will complete another cycle of FOLFIRI/Avastin today. Ms. Mateus will return problems visit and chemotherapy in 3 weeks.  She will undergo a restaging CT evaluation in July.   Thornton Papas, MD  01/30/2012  9:34 AM

## 2012-01-30 NOTE — Telephone Encounter (Signed)
appts made and printed for pt aom °

## 2012-01-30 NOTE — Patient Instructions (Addendum)
[facility] Discharge Instructions for Patients Receiving Chemotherapy  Today you received the following chemotherapy agents Camptosar, Leucovorin, Avastin and 5FU.  To help prevent nausea and vomiting after your treatment, we encourage you to take your nausea medication as ordered per MD.    If you develop nausea and vomiting that is not controlled by your nausea medication, call the clinic. If it is after clinic hours your family physician or the after hours number for the clinic or go to the Emergency Department.   BELOW ARE SYMPTOMS THAT SHOULD BE REPORTED IMMEDIATELY:  *FEVER GREATER THAN 100.5 F  *CHILLS WITH OR WITHOUT FEVER  NAUSEA AND VOMITING THAT IS NOT CONTROLLED WITH YOUR NAUSEA MEDICATION  *UNUSUAL SHORTNESS OF BREATH  *UNUSUAL BRUISING OR BLEEDING  TENDERNESS IN MOUTH AND THROAT WITH OR WITHOUT PRESENCE OF ULCERS  *URINARY PROBLEMS  *BOWEL PROBLEMS  UNUSUAL RASH Items with * indicate a potential emergency and should be followed up as soon as possible.   Please let the nurse know about any problems that you may have experienced. Feel free to call the clinic you have any questions or concerns. The clinic phone number is [phone].   I have been informed and understand all the instructions given to me. I know to contact the clinic, my physician, or go to the Emergency Department if any problems should occur. I do not have any questions at this time, but understand that I may call the clinic during office hours   should I have any questions or need assistance in obtaining follow up care.  Return to [facility] on 02/01/12 at 1300 for pump discontinue.    __________________________________________  _____________  __________ Signature of Patient or Authorized Representative            Date                   Time    __________________________________________ Nurse's Signature

## 2012-02-01 ENCOUNTER — Ambulatory Visit (HOSPITAL_BASED_OUTPATIENT_CLINIC_OR_DEPARTMENT_OTHER): Payer: Medicare HMO

## 2012-02-01 VITALS — BP 136/85 | HR 71 | Temp 98.3°F

## 2012-02-01 DIAGNOSIS — C189 Malignant neoplasm of colon, unspecified: Secondary | ICD-10-CM

## 2012-02-01 DIAGNOSIS — Z452 Encounter for adjustment and management of vascular access device: Secondary | ICD-10-CM

## 2012-02-01 MED ORDER — SODIUM CHLORIDE 0.9 % IJ SOLN
10.0000 mL | INTRAMUSCULAR | Status: DC | PRN
  Administered 2012-02-01: 10 mL
  Filled 2012-02-01: qty 10

## 2012-02-01 MED ORDER — HEPARIN SOD (PORK) LOCK FLUSH 100 UNIT/ML IV SOLN
500.0000 [IU] | Freq: Once | INTRAVENOUS | Status: AC
  Administered 2012-02-01: 500 [IU]
  Filled 2012-02-01: qty 5

## 2012-02-01 NOTE — Patient Instructions (Signed)
Call MD for Problems 

## 2012-02-02 ENCOUNTER — Other Ambulatory Visit: Payer: Self-pay | Admitting: Certified Registered Nurse Anesthetist

## 2012-02-18 ENCOUNTER — Other Ambulatory Visit: Payer: Self-pay | Admitting: Oncology

## 2012-02-20 ENCOUNTER — Ambulatory Visit (HOSPITAL_BASED_OUTPATIENT_CLINIC_OR_DEPARTMENT_OTHER): Payer: Medicare HMO

## 2012-02-20 ENCOUNTER — Ambulatory Visit (HOSPITAL_BASED_OUTPATIENT_CLINIC_OR_DEPARTMENT_OTHER): Payer: Medicare HMO | Admitting: Nurse Practitioner

## 2012-02-20 ENCOUNTER — Telehealth: Payer: Self-pay | Admitting: *Deleted

## 2012-02-20 ENCOUNTER — Other Ambulatory Visit (HOSPITAL_BASED_OUTPATIENT_CLINIC_OR_DEPARTMENT_OTHER): Payer: Medicare HMO | Admitting: Lab

## 2012-02-20 ENCOUNTER — Telehealth: Payer: Self-pay | Admitting: Oncology

## 2012-02-20 VITALS — BP 121/79 | HR 68 | Temp 97.8°F

## 2012-02-20 VITALS — BP 126/79 | HR 69 | Temp 97.2°F | Ht 60.0 in | Wt 143.8 lb

## 2012-02-20 DIAGNOSIS — C787 Secondary malignant neoplasm of liver and intrahepatic bile duct: Secondary | ICD-10-CM

## 2012-02-20 DIAGNOSIS — C180 Malignant neoplasm of cecum: Secondary | ICD-10-CM

## 2012-02-20 DIAGNOSIS — C189 Malignant neoplasm of colon, unspecified: Secondary | ICD-10-CM

## 2012-02-20 DIAGNOSIS — Z5111 Encounter for antineoplastic chemotherapy: Secondary | ICD-10-CM

## 2012-02-20 LAB — CBC WITH DIFFERENTIAL/PLATELET
Basophils Absolute: 0 10*3/uL (ref 0.0–0.1)
Eosinophils Absolute: 0.1 10*3/uL (ref 0.0–0.5)
HCT: 40.8 % (ref 34.8–46.6)
LYMPH%: 34.5 % (ref 14.0–49.7)
MONO#: 0.7 10*3/uL (ref 0.1–0.9)
NEUT#: 2.3 10*3/uL (ref 1.5–6.5)
NEUT%: 47.1 % (ref 38.4–76.8)
Platelets: 214 10*3/uL (ref 145–400)
WBC: 4.9 10*3/uL (ref 3.9–10.3)

## 2012-02-20 LAB — COMPREHENSIVE METABOLIC PANEL
BUN: 11 mg/dL (ref 6–23)
CO2: 28 mEq/L (ref 19–32)
Creatinine, Ser: 0.45 mg/dL — ABNORMAL LOW (ref 0.50–1.10)
Glucose, Bld: 85 mg/dL (ref 70–99)
Total Bilirubin: 0.2 mg/dL — ABNORMAL LOW (ref 0.3–1.2)
Total Protein: 7.7 g/dL (ref 6.0–8.3)

## 2012-02-20 MED ORDER — SODIUM CHLORIDE 0.9 % IV SOLN
5.0000 mg/kg | Freq: Once | INTRAVENOUS | Status: AC
  Administered 2012-02-20: 325 mg via INTRAVENOUS
  Filled 2012-02-20: qty 13

## 2012-02-20 MED ORDER — SODIUM CHLORIDE 0.9 % IV SOLN
1475.0000 mg/m2 | INTRAVENOUS | Status: DC
  Administered 2012-02-20: 2600 mg via INTRAVENOUS
  Filled 2012-02-20: qty 52

## 2012-02-20 MED ORDER — LEUCOVORIN CALCIUM INJECTION 350 MG
375.0000 mg/m2 | Freq: Once | INTRAVENOUS | Status: AC
  Administered 2012-02-20: 660 mg via INTRAVENOUS
  Filled 2012-02-20: qty 33

## 2012-02-20 MED ORDER — DEXAMETHASONE SODIUM PHOSPHATE 4 MG/ML IJ SOLN
20.0000 mg | Freq: Once | INTRAMUSCULAR | Status: AC
  Administered 2012-02-20: 20 mg via INTRAVENOUS

## 2012-02-20 MED ORDER — IRINOTECAN HCL CHEMO INJECTION 100 MG/5ML
170.0000 mg/m2 | Freq: Once | INTRAVENOUS | Status: AC
  Administered 2012-02-20: 300 mg via INTRAVENOUS
  Filled 2012-02-20: qty 15

## 2012-02-20 MED ORDER — PALONOSETRON HCL INJECTION 0.25 MG/5ML
0.2500 mg | Freq: Once | INTRAVENOUS | Status: AC
  Administered 2012-02-20: 0.25 mg via INTRAVENOUS

## 2012-02-20 MED ORDER — ATROPINE SULFATE 0.4 MG/ML IJ SOLN
0.5000 mg | Freq: Once | INTRAMUSCULAR | Status: AC | PRN
  Administered 2012-02-20: 0.5 mg via INTRAVENOUS
  Filled 2012-02-20: qty 1.25

## 2012-02-20 MED ORDER — SODIUM CHLORIDE 0.9 % IV SOLN
Freq: Once | INTRAVENOUS | Status: AC
  Administered 2012-02-20: 11:00:00 via INTRAVENOUS

## 2012-02-20 MED ORDER — FLUOROURACIL CHEMO INJECTION 500 MG/10ML
250.0000 mg/m2 | Freq: Once | INTRAVENOUS | Status: AC
  Administered 2012-02-20: 450 mg via INTRAVENOUS
  Filled 2012-02-20: qty 9

## 2012-02-20 NOTE — Patient Instructions (Signed)
[facility] Discharge Instructions for Patients Receiving Chemotherapy  Today you received the following chemotherapy agents Camptosar, Leucovorin, Avastin, and 5FU.  To help prevent nausea and vomiting after your treatment, we encourage you to take your nausea medication.   If you develop nausea and vomiting that is not controlled by your nausea medication, call the clinic. If it is after clinic hours your family physician or the after hours number for the clinic or go to the Emergency Department.   BELOW ARE SYMPTOMS THAT SHOULD BE REPORTED IMMEDIATELY:  *FEVER GREATER THAN 100.5 F  *CHILLS WITH OR WITHOUT FEVER  NAUSEA AND VOMITING THAT IS NOT CONTROLLED WITH YOUR NAUSEA MEDICATION  *UNUSUAL SHORTNESS OF BREATH  *UNUSUAL BRUISING OR BLEEDING  TENDERNESS IN MOUTH AND THROAT WITH OR WITHOUT PRESENCE OF ULCERS  *URINARY PROBLEMS  *BOWEL PROBLEMS  UNUSUAL RASH Items with * indicate a potential emergency and should be followed up as soon as possible.  One of the nurses will contact you 24 hours after your treatment. Please let the nurse know about any problems that you may have experienced. Feel free to call the clinic you have any questions or concerns. The clinic phone number is [phone].   I have been informed and understand all the instructions given to me. I know to contact the clinic, my physician, or go to the Emergency Department if any problems should occur. I do not have any questions at this time, but understand that I may call the clinic during office hours   should I have any questions or need assistance in obtaining follow up care.    __________________________________________  _____________  __________ Signature of Patient or Authorized Representative            Date                   Time    __________________________________________ Nurse's Signature

## 2012-02-20 NOTE — Progress Notes (Signed)
OFFICE PROGRESS NOTE  Interval history:  [name] returns as scheduled. She completed the most recent cycle of FOLFIRI/Avastin on 01/30/2012. She had minimal nausea. No vomiting. She continues to have intermittent loose stools. Her mouth became sore. She did not develop discrete ulcers. She denies bleeding. No shortness of breath, cough or chest pain. She has a good appetite.   Objective: Blood pressure 126/79, pulse 69, temperature 97.2 F (36.2 C), temperature source Oral, height 5' (1.524 m), weight 143 lb 12.8 oz (65.227 kg).  Oropharynx is without thrush or ulceration. Lungs are clear. Regular cardiac rhythm. Abdomen is soft and nontender. No hepatomegaly. Trace edema at the lower legs bilaterally.  Lab Results: Lab Results  Component Value Date   WBC 4.9 02/20/2012   HGB 13.7 02/20/2012   HCT 40.8 02/20/2012   MCV 96.9 02/20/2012   PLT 214 02/20/2012    Chemistry:    Chemistry      Component Value Date/Time   NA 138 01/30/2012 0849   NA 136 05/02/2011 0954   K 3.9 01/30/2012 0849   K 4.5 05/02/2011 0954   CL 104 01/30/2012 0849   CL 93* 05/02/2011 0954   CO2 25 01/30/2012 0849   CO2 29 05/02/2011 0954   BUN 12 01/30/2012 0849   BUN 12 05/02/2011 0954   CREATININE 0.41* 01/30/2012 0849   CREATININE 0.5* 05/02/2011 0954      Component Value Date/Time   CALCIUM 9.5 01/30/2012 0849   CALCIUM 9.3 05/02/2011 0954   ALKPHOS 65 01/30/2012 0849   ALKPHOS 78 05/02/2011 0954   AST 36 01/30/2012 0849   AST 31 05/02/2011 0954   ALT 29 01/30/2012 0849   BILITOT 0.2* 01/30/2012 0849   BILITOT 0.40 05/02/2011 0954       Studies/Results: No results found.  Medications: I have reviewed the patient's current medications.  Assessment/Plan:  1. 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 She completed 12 cycles of FOLFOX chemotherapy. Oxaliplatin was held with cycles number 5, 8,  11, and 12. 2. Metastatic colon cancer confirmed on a restaging CT evaluation 05/02/2011 with liver metastases and periportal lymphadenopathy. She began treatment with FOLFIRI on 06/01/2011. Avastin was added beginning with cycle number 2. She completed cycle #6 08/08/2011. Restaging CT evaluation 08/15/2011 showed improvement in the liver metastases and porta hepatis lymphadenopathy. She completed cycle #12 FOLFIRI/Avastin 11/07/2011. Restaging CT evaluation 11/24/2011 showed decrease in size of hepatic metastases and no evidence of disease progression or new metastasis. She completed cycle 15 on 01/30/2012. 3. Status post Port-A-Cath placement and Port-A-Cath removal by Dr. Michaell Cowing. A new Port-A-Cath was placed on 05/16/2011. The Port-A-Cath was removed due to malposition. A new Port-A-Cath was placed on 05/30/2011. 4. Spina bifida with severe scoliosis.  5. Chronic bilateral foot drop/lower leg and foot numbness. 6. Hypothyroidism. 7. History of chemotherapy-induced diarrhea. The bolus and infusional 5-FU were reduced by 25% beginning with cycle number 2 of FOLFOX. The 5-FU dose was decreased by an additional 10% beginning with cycle number 8. 8. History of delayed nausea following chemotherapy. 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 The oxaliplatin was dose reduced by 20% beginning with cycle number 6. The neuropathy symptoms resolved. 10. History of neutropenia secondary to chemotherapy. 11. Status post a colonoscopy 05/05/2010. There was a previous right colectomy anastomosis and slight nodularity. Biopsies were taken which showed benign colonic mucosa  with hyperplastic epithelial changes. No significant inflammation, granuloma, or adenomatous epithelium was identified. 12. Diarrhea following FOLFIRI chemotherapy. Overall the diarrhea is controlled with Lomotil.  13. History of a fungal rash in the groin, improved with Diflucan and nystatin powder. 14. Mouth ulcers, oral  candidiasis following cycle #6 of FOLFIRI-the ulcers resolved following treatment with Diflucan.  Disposition-Ms. Pewitt appears stable. Plan to proceed with cycle 16 FOLFIRI/Avastin today as scheduled. She will return for a followup visit and cycle 17 in 3 weeks. She will contact the office in the interim with any problems.  Plan reviewed with Dr. Truett Perna.  Lonna Cobb ANP/GNP-BC

## 2012-02-20 NOTE — Telephone Encounter (Signed)
appts made and printed for pt ,pt aware that mw will ad tx for 7/23,email to mw       aom

## 2012-02-20 NOTE — Telephone Encounter (Signed)
Per staff message from Anne, I have scheduled treatment appt.  JMW  

## 2012-02-22 ENCOUNTER — Ambulatory Visit (HOSPITAL_BASED_OUTPATIENT_CLINIC_OR_DEPARTMENT_OTHER): Payer: Medicare HMO

## 2012-02-22 VITALS — BP 133/64 | HR 73 | Temp 98.5°F

## 2012-02-22 DIAGNOSIS — C180 Malignant neoplasm of cecum: Secondary | ICD-10-CM

## 2012-02-22 DIAGNOSIS — C189 Malignant neoplasm of colon, unspecified: Secondary | ICD-10-CM

## 2012-02-22 MED ORDER — HEPARIN SOD (PORK) LOCK FLUSH 100 UNIT/ML IV SOLN
500.0000 [IU] | Freq: Once | INTRAVENOUS | Status: AC
  Administered 2012-02-22: 500 [IU]
  Filled 2012-02-22: qty 5

## 2012-02-22 MED ORDER — SODIUM CHLORIDE 0.9 % IJ SOLN
10.0000 mL | INTRAMUSCULAR | Status: DC | PRN
  Administered 2012-02-22: 10 mL
  Filled 2012-02-22: qty 10

## 2012-03-01 ENCOUNTER — Other Ambulatory Visit: Payer: Self-pay | Admitting: *Deleted

## 2012-03-01 DIAGNOSIS — C189 Malignant neoplasm of colon, unspecified: Secondary | ICD-10-CM

## 2012-03-01 MED ORDER — DIPHENOXYLATE-ATROPINE 2.5-0.025 MG PO TABS: ORAL_TABLET | ORAL | Status: DC

## 2012-03-01 MED ORDER — LORAZEPAM 0.5 MG PO TABS: 0.5000 mg | ORAL_TABLET | Freq: Two times a day (BID) | ORAL | Status: DC | PRN

## 2012-03-10 ENCOUNTER — Other Ambulatory Visit: Payer: Self-pay | Admitting: Oncology

## 2012-03-11 ENCOUNTER — Telehealth: Payer: Self-pay | Admitting: Oncology

## 2012-03-11 ENCOUNTER — Ambulatory Visit (HOSPITAL_BASED_OUTPATIENT_CLINIC_OR_DEPARTMENT_OTHER): Payer: Medicare HMO

## 2012-03-11 ENCOUNTER — Ambulatory Visit (HOSPITAL_BASED_OUTPATIENT_CLINIC_OR_DEPARTMENT_OTHER): Payer: Medicare HMO | Admitting: Oncology

## 2012-03-11 ENCOUNTER — Other Ambulatory Visit: Payer: Medicare HMO | Admitting: Lab

## 2012-03-11 VITALS — BP 132/82 | HR 73 | Temp 97.1°F | Ht 60.0 in | Wt 142.8 lb

## 2012-03-11 DIAGNOSIS — C189 Malignant neoplasm of colon, unspecified: Secondary | ICD-10-CM

## 2012-03-11 DIAGNOSIS — C180 Malignant neoplasm of cecum: Secondary | ICD-10-CM

## 2012-03-11 DIAGNOSIS — Z5112 Encounter for antineoplastic immunotherapy: Secondary | ICD-10-CM

## 2012-03-11 DIAGNOSIS — C787 Secondary malignant neoplasm of liver and intrahepatic bile duct: Secondary | ICD-10-CM

## 2012-03-11 DIAGNOSIS — Z5111 Encounter for antineoplastic chemotherapy: Secondary | ICD-10-CM

## 2012-03-11 LAB — COMPREHENSIVE METABOLIC PANEL
ALT: 31 U/L (ref 0–35)
AST: 35 U/L (ref 0–37)
Alkaline Phosphatase: 72 U/L (ref 39–117)
BUN: 10 mg/dL (ref 6–23)
Calcium: 9.5 mg/dL (ref 8.4–10.5)
Chloride: 103 mEq/L (ref 96–112)
Creatinine, Ser: 0.42 mg/dL — ABNORMAL LOW (ref 0.50–1.10)
Potassium: 3.8 mEq/L (ref 3.5–5.3)

## 2012-03-11 LAB — CBC WITH DIFFERENTIAL/PLATELET
BASO%: 0.9 % (ref 0.0–2.0)
EOS%: 3.1 % (ref 0.0–7.0)
MCH: 32.3 pg (ref 25.1–34.0)
MCHC: 33.4 g/dL (ref 31.5–36.0)
MCV: 96.7 fL (ref 79.5–101.0)
MONO%: 14.1 % — ABNORMAL HIGH (ref 0.0–14.0)
NEUT%: 45 % (ref 38.4–76.8)
RDW: 14.2 % (ref 11.2–14.5)
lymph#: 1.7 10*3/uL (ref 0.9–3.3)

## 2012-03-11 MED ORDER — DEXAMETHASONE SODIUM PHOSPHATE 4 MG/ML IJ SOLN
20.0000 mg | Freq: Once | INTRAMUSCULAR | Status: AC
  Administered 2012-03-11: 20 mg via INTRAVENOUS

## 2012-03-11 MED ORDER — ATROPINE SULFATE 0.4 MG/ML IJ SOLN
0.5000 mg | Freq: Once | INTRAMUSCULAR | Status: AC | PRN
  Administered 2012-03-11: 0.5 mg via INTRAVENOUS
  Filled 2012-03-11: qty 1.25

## 2012-03-11 MED ORDER — IRINOTECAN HCL CHEMO INJECTION 100 MG/5ML
170.0000 mg/m2 | Freq: Once | INTRAVENOUS | Status: AC
  Administered 2012-03-11: 300 mg via INTRAVENOUS
  Filled 2012-03-11: qty 15

## 2012-03-11 MED ORDER — SODIUM CHLORIDE 0.9 % IV SOLN
Freq: Once | INTRAVENOUS | Status: AC
  Administered 2012-03-11: 10:00:00 via INTRAVENOUS

## 2012-03-11 MED ORDER — SODIUM CHLORIDE 0.9 % IV SOLN
5.0000 mg/kg | Freq: Once | INTRAVENOUS | Status: AC
  Administered 2012-03-11: 325 mg via INTRAVENOUS
  Filled 2012-03-11: qty 13

## 2012-03-11 MED ORDER — FLUOROURACIL CHEMO INJECTION 500 MG/10ML
250.0000 mg/m2 | Freq: Once | INTRAVENOUS | Status: AC
  Administered 2012-03-11: 450 mg via INTRAVENOUS
  Filled 2012-03-11: qty 9

## 2012-03-11 MED ORDER — LEUCOVORIN CALCIUM INJECTION 350 MG
375.0000 mg/m2 | Freq: Once | INTRAMUSCULAR | Status: AC
  Administered 2012-03-11: 660 mg via INTRAVENOUS
  Filled 2012-03-11: qty 33

## 2012-03-11 MED ORDER — SODIUM CHLORIDE 0.9 % IV SOLN
1475.0000 mg/m2 | INTRAVENOUS | Status: DC
  Administered 2012-03-11: 2600 mg via INTRAVENOUS
  Filled 2012-03-11: qty 52

## 2012-03-11 MED ORDER — PALONOSETRON HCL INJECTION 0.25 MG/5ML
0.2500 mg | Freq: Once | INTRAVENOUS | Status: AC
  Administered 2012-03-11: 0.25 mg via INTRAVENOUS

## 2012-03-11 NOTE — Progress Notes (Signed)
   [facility]    OFFICE PROGRESS NOTE   INTERVAL HISTORY:   She returns as scheduled. She completed another cycle of chemotherapy on 02/20/2012. She reports tolerating the chemotherapy well. She has nausea for approximately 2 days following chemotherapy. No significant diarrhea. The diarrhea has been controlled with Lomotil. No symptoms of venous or arterial thrombosis. No bleeding. No new complaint. Mild soreness in the mouth without discrete ulcers.  Objective:  Vital signs in last 24 hours:  Blood pressure 132/82, pulse 73, temperature 97.1 F (36.2 C), temperature source Oral, height 5' (1.524 m), weight 142 lb 12.8 oz (64.774 kg).    HEENT: No thrush or ulcers Resp: Scattered and inspiratory rhonchi, no respiratory distress Cardio: Regular rate and rhythm GI: Nontender, no hepatomegaly Vascular: No leg edema  Portacath/PICC-without erythema  Lab Results:  Lab Results  Component Value Date   WBC 4.6 03/11/2012   HGB 13.2 03/11/2012   HCT 39.6 03/11/2012   MCV 96.7 03/11/2012   PLT 210 03/11/2012   ANC 2.1    Medications: I have reviewed the patient's current medications.  Assessment/Plan: 1. 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 She completed 12 cycles of FOLFOX chemotherapy. Oxaliplatin was held with cycles number 5, 8, 11, and 12. 2. Metastatic colon cancer confirmed on a restaging CT evaluation 05/02/2011 with liver metastases and periportal lymphadenopathy. She began treatment with FOLFIRI on 06/01/2011. Avastin was added beginning with cycle number 2. She completed cycle #6 08/08/2011. Restaging CT evaluation 08/15/2011 showed improvement in the liver metastases and porta hepatis lymphadenopathy. She completed cycle #12 FOLFIRI/Avastin 11/07/2011. Restaging CT evaluation 11/24/2011 showed decrease in size of hepatic metastases and no  evidence of disease progression or new metastasis. She completed cycle 16 on 02/20/2012. 3. Status post Port-A-Cath placement and Port-A-Cath removal by Dr. Michaell Cowing. A new Port-A-Cath was placed on 05/16/2011. The Port-A-Cath was removed due to malposition. A new Port-A-Cath was placed on 05/30/2011. 4. Spina bifida with severe scoliosis.  5. Chronic bilateral foot drop/lower leg and foot numbness. 6. Hypothyroidism. 7. History of chemotherapy-induced diarrhea. The bolus and infusional 5-FU were reduced by 25% beginning with cycle number 2 of FOLFOX. The 5-FU dose was decreased by an additional 10% beginning with cycle number 8. 8. History of delayed nausea following chemotherapy. 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 The oxaliplatin was dose reduced by 20% beginning with cycle number 6. The neuropathy symptoms resolved. 10. History of neutropenia secondary to chemotherapy. 11. Status post a colonoscopy 05/05/2010. There was a previous right colectomy anastomosis and slight nodularity. Biopsies were taken which showed benign colonic mucosa with hyperplastic epithelial changes. No significant inflammation, granuloma, or adenomatous epithelium was identified. 12. Diarrhea following FOLFIRI chemotherapy. The diarrhea is controlled with Lomotil.  13. History of a fungal rash in the groin, improved with Diflucan and nystatin powder. 14. Mouth ulcers, oral candidiasis following cycle #6 of FOLFIRI-the ulcers resolved following treatment with Diflucan.  Disposition:  She appears stable. She will complete another cycle of FOLFIRI/Avastin today. Ms. Dame will return for an office visit and chemotherapy in 3 weeks. She will undergo a restaging CT evaluation prior to the next scheduled visit.   Thornton Papas, MD  03/11/2012  9:12 AM

## 2012-03-11 NOTE — Patient Instructions (Addendum)
[facility] Discharge Instructions for Patients Receiving Chemotherapy  Today you received the following chemotherapy agents; Avastin, Camptosar, Leucovorin and 5FU To help prevent nausea and vomiting after your treatment, we encourage you to take your nausea medication;  Zofran and Compazine as directed. May start taking this evening.    If you develop nausea and vomiting that is not controlled by your nausea medication, call the clinic. If it is after clinic hours your family physician or the after hours number for the clinic or go to the Emergency Department.   BELOW ARE SYMPTOMS THAT SHOULD BE REPORTED IMMEDIATELY:  *FEVER GREATER THAN 100.5 F  *CHILLS WITH OR WITHOUT FEVER  NAUSEA AND VOMITING THAT IS NOT CONTROLLED WITH YOUR NAUSEA MEDICATION  *UNUSUAL SHORTNESS OF BREATH  *UNUSUAL BRUISING OR BLEEDING  TENDERNESS IN MOUTH AND THROAT WITH OR WITHOUT PRESENCE OF ULCERS  *URINARY PROBLEMS  *BOWEL PROBLEMS  UNUSUAL RASH Items with * indicate a potential emergency and should be followed up as soon as possible.  Feel free to call the clinic you have any questions or concerns. The clinic phone number is [phone].   I have been informed and understand all the instructions given to me. I know to contact the clinic, my physician, or go to the Emergency Department if any problems should occur. I do not have any questions at this time, but understand that I may call the clinic during office hours   should I have any questions or need assistance in obtaining follow up care.    __________________________________________  _____________  __________ Signature of Patient or Authorized Representative            Date                   Time    __________________________________________ Nurse's Signature

## 2012-03-11 NOTE — Telephone Encounter (Signed)
appts made and printed for pt aom °

## 2012-03-12 ENCOUNTER — Other Ambulatory Visit: Payer: Self-pay | Admitting: Certified Registered Nurse Anesthetist

## 2012-03-13 ENCOUNTER — Ambulatory Visit (HOSPITAL_BASED_OUTPATIENT_CLINIC_OR_DEPARTMENT_OTHER): Payer: Medicare HMO

## 2012-03-13 VITALS — BP 137/84 | HR 75 | Temp 98.5°F

## 2012-03-13 DIAGNOSIS — C180 Malignant neoplasm of cecum: Secondary | ICD-10-CM

## 2012-03-13 DIAGNOSIS — C189 Malignant neoplasm of colon, unspecified: Secondary | ICD-10-CM

## 2012-03-13 DIAGNOSIS — C787 Secondary malignant neoplasm of liver and intrahepatic bile duct: Secondary | ICD-10-CM

## 2012-03-13 MED ORDER — HEPARIN SOD (PORK) LOCK FLUSH 100 UNIT/ML IV SOLN
500.0000 [IU] | Freq: Once | INTRAVENOUS | Status: AC
  Administered 2012-03-13: 500 [IU]
  Filled 2012-03-13: qty 5

## 2012-03-13 MED ORDER — SODIUM CHLORIDE 0.9 % IJ SOLN
10.0000 mL | INTRAMUSCULAR | Status: DC | PRN
  Administered 2012-03-13: 10 mL
  Filled 2012-03-13: qty 10

## 2012-03-29 ENCOUNTER — Ambulatory Visit (HOSPITAL_COMMUNITY)
Admission: RE | Admit: 2012-03-29 | Discharge: 2012-03-29 | Disposition: A | Discharge status: Home / Self Care | Payer: Medicare HMO | Source: Ambulatory Visit | Attending: Oncology | Admitting: Oncology

## 2012-03-29 DIAGNOSIS — E041 Nontoxic single thyroid nodule: Secondary | ICD-10-CM | POA: Insufficient documentation

## 2012-03-29 DIAGNOSIS — M899 Disorder of bone, unspecified: Secondary | ICD-10-CM | POA: Insufficient documentation

## 2012-03-29 DIAGNOSIS — C189 Malignant neoplasm of colon, unspecified: Secondary | ICD-10-CM | POA: Insufficient documentation

## 2012-03-29 DIAGNOSIS — D259 Leiomyoma of uterus, unspecified: Secondary | ICD-10-CM | POA: Insufficient documentation

## 2012-03-29 DIAGNOSIS — N8189 Other female genital prolapse: Secondary | ICD-10-CM | POA: Insufficient documentation

## 2012-03-29 DIAGNOSIS — C787 Secondary malignant neoplasm of liver and intrahepatic bile duct: Secondary | ICD-10-CM | POA: Insufficient documentation

## 2012-03-29 DIAGNOSIS — K7689 Other specified diseases of liver: Secondary | ICD-10-CM | POA: Insufficient documentation

## 2012-03-29 MED ORDER — IOHEXOL 300 MG/ML  SOLN
100.0000 mL | Freq: Once | INTRAMUSCULAR | Status: AC | PRN
  Administered 2012-03-29: 100 mL via INTRAVENOUS

## 2012-04-01 ENCOUNTER — Other Ambulatory Visit: Payer: Self-pay | Admitting: Oncology

## 2012-04-02 ENCOUNTER — Telehealth: Payer: Self-pay | Admitting: *Deleted

## 2012-04-02 ENCOUNTER — Other Ambulatory Visit (HOSPITAL_BASED_OUTPATIENT_CLINIC_OR_DEPARTMENT_OTHER): Payer: Medicare HMO | Admitting: Lab

## 2012-04-02 ENCOUNTER — Ambulatory Visit (HOSPITAL_BASED_OUTPATIENT_CLINIC_OR_DEPARTMENT_OTHER): Payer: Medicare HMO | Admitting: Oncology

## 2012-04-02 ENCOUNTER — Ambulatory Visit (HOSPITAL_BASED_OUTPATIENT_CLINIC_OR_DEPARTMENT_OTHER): Payer: Medicare HMO

## 2012-04-02 VITALS — BP 118/78 | HR 71 | Temp 97.3°F | Ht 60.0 in | Wt 142.1 lb

## 2012-04-02 DIAGNOSIS — C180 Malignant neoplasm of cecum: Secondary | ICD-10-CM

## 2012-04-02 DIAGNOSIS — C787 Secondary malignant neoplasm of liver and intrahepatic bile duct: Secondary | ICD-10-CM

## 2012-04-02 DIAGNOSIS — C189 Malignant neoplasm of colon, unspecified: Secondary | ICD-10-CM

## 2012-04-02 DIAGNOSIS — C772 Secondary and unspecified malignant neoplasm of intra-abdominal lymph nodes: Secondary | ICD-10-CM

## 2012-04-02 DIAGNOSIS — Z5111 Encounter for antineoplastic chemotherapy: Secondary | ICD-10-CM

## 2012-04-02 LAB — CBC WITH DIFFERENTIAL/PLATELET
Basophils Absolute: 0 10*3/uL (ref 0.0–0.1)
EOS%: 3.5 % (ref 0.0–7.0)
Eosinophils Absolute: 0.2 10*3/uL (ref 0.0–0.5)
HGB: 12.7 g/dL (ref 11.6–15.9)
LYMPH%: 33.7 % (ref 14.0–49.7)
MCH: 32.5 pg (ref 25.1–34.0)
MCV: 96.4 fL (ref 79.5–101.0)
MONO%: 14 % (ref 0.0–14.0)
NEUT#: 2.2 10*3/uL (ref 1.5–6.5)
Platelets: 223 10*3/uL (ref 145–400)
RBC: 3.91 10*6/uL (ref 3.70–5.45)
RDW: 14.5 % (ref 11.2–14.5)

## 2012-04-02 LAB — COMPREHENSIVE METABOLIC PANEL
AST: 34 U/L (ref 0–37)
Alkaline Phosphatase: 66 U/L (ref 39–117)
BUN: 11 mg/dL (ref 6–23)
Glucose, Bld: 110 mg/dL — ABNORMAL HIGH (ref 70–99)
Potassium: 3.8 mEq/L (ref 3.5–5.3)
Total Bilirubin: 0.2 mg/dL — ABNORMAL LOW (ref 0.3–1.2)

## 2012-04-02 MED ORDER — FLUOROURACIL CHEMO INJECTION 500 MG/10ML
250.0000 mg/m2 | Freq: Once | INTRAVENOUS | Status: AC
  Administered 2012-04-02: 450 mg via INTRAVENOUS
  Filled 2012-04-02: qty 9

## 2012-04-02 MED ORDER — SODIUM CHLORIDE 0.9 % IJ SOLN
10.0000 mL | INTRAMUSCULAR | Status: DC | PRN
  Filled 2012-04-02: qty 10

## 2012-04-02 MED ORDER — SODIUM CHLORIDE 0.9 % IV SOLN
Freq: Once | INTRAVENOUS | Status: AC
  Administered 2012-04-02: 12:00:00 via INTRAVENOUS

## 2012-04-02 MED ORDER — SODIUM CHLORIDE 0.9 % IV SOLN
5.0000 mg/kg | Freq: Once | INTRAVENOUS | Status: AC
  Administered 2012-04-02: 325 mg via INTRAVENOUS
  Filled 2012-04-02: qty 13

## 2012-04-02 MED ORDER — HEPARIN SOD (PORK) LOCK FLUSH 100 UNIT/ML IV SOLN
500.0000 [IU] | Freq: Once | INTRAVENOUS | Status: DC
  Filled 2012-04-02: qty 5

## 2012-04-02 MED ORDER — LEUCOVORIN CALCIUM INJECTION 350 MG
375.0000 mg/m2 | Freq: Once | INTRAVENOUS | Status: AC
  Administered 2012-04-02: 660 mg via INTRAVENOUS
  Filled 2012-04-02: qty 33

## 2012-04-02 MED ORDER — SODIUM CHLORIDE 0.9 % IV SOLN
1475.0000 mg/m2 | INTRAVENOUS | Status: DC
  Administered 2012-04-02: 2600 mg via INTRAVENOUS
  Filled 2012-04-02: qty 52

## 2012-04-02 NOTE — Patient Instructions (Signed)
[facility] Discharge Instructions for Patients Receiving Chemotherapy  Today you received the following chemotherapy agents avastin,5-fu,Leucovorin  To help prevent nausea and vomiting after your treatment, we encourage you to take your nausea medication   If you develop nausea and vomiting that is not controlled by your nausea medication, call the clinic. If it is after clinic hours your family physician or the after hours number for the clinic or go to the Emergency Department.   BELOW ARE SYMPTOMS THAT SHOULD BE REPORTED IMMEDIATELY:  *FEVER GREATER THAN 100.5 F  *CHILLS WITH OR WITHOUT FEVER  NAUSEA AND VOMITING THAT IS NOT CONTROLLED WITH YOUR NAUSEA MEDICATION  *UNUSUAL SHORTNESS OF BREATH  *UNUSUAL BRUISING OR BLEEDING  TENDERNESS IN MOUTH AND THROAT WITH OR WITHOUT PRESENCE OF ULCERS  *URINARY PROBLEMS  *BOWEL PROBLEMS  UNUSUAL RASH Items with * indicate a potential emergency and should be followed up as soon as possible.  One of the nurses will contact you 24 hours after your treatment. Please let the nurse know about any problems that you may have experienced. Feel free to call the clinic you have any questions or concerns. The clinic phone number is [phone].   I have been informed and understand all the instructions given to me. I know to contact the clinic, my physician, or go to the Emergency Department if any problems should occur. I do not have any questions at this time, but understand that I may call the clinic during office hours   should I have any questions or need assistance in obtaining follow up care.    __________________________________________  _____________  __________ Signature of Patient or Authorized Representative            Date                   Time    __________________________________________ Nurse's Signature

## 2012-04-02 NOTE — Telephone Encounter (Signed)
Per staff message and POF I have scheduled appts.  JMW  

## 2012-04-02 NOTE — Progress Notes (Signed)
April Spence    OFFICE PROGRESS NOTE   INTERVAL HISTORY:   She returns as scheduled. No new complaint. Diarrhea is controlled with medical therapy. She was last treated with FOLFIRI/Avastin on 03/11/2012.  Objective:  Vital signs in last 24 hours:  Blood pressure 118/78, pulse 71, temperature 97.3 F (36.3 C), temperature source Oral, height 5' (1.524 m), weight 142 lb 1.6 oz (64.456 kg).    HEENT: No thrush or ulcers Resp: Lungs with inspiratory rhonchi at the bases bilaterally, no respiratory distress Cardiac: Regular rate and rhythm GI: Nontender, no hepatomegaly Vascular: No leg edema   Portacath/PICC-without erythema  Lab Results:  Lab Results  Component Value Date   WBC 4.7 04/02/2012   HGB 12.7 04/02/2012   HCT 37.7 04/02/2012   MCV 96.4 04/02/2012   PLT 223 04/02/2012   ANC 2.2  X-rays: CTs of the chest, abdomen, and pelvis on 03/29/2012-no acute process or evidence of metastatic disease in the chest. A central right liver metastasis measures 3.5 x 3.1 cm compared to 3.3 x 2.8 cm on the prior exam. The lesion in the left lobe of liver is not well demarcated. It appears similar. A portal lymph node appears stable. No new lesions. No evidence of extrahepatic metastatic disease in the abdomen or pelvis.  I reviewed the CT with a April Spence radiologist and the patient/husband.  Medications: I have reviewed the patient's current medications.  Assessment/Plan:  1. 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 She completed 12 cycles of FOLFOX chemotherapy. Oxaliplatin was held with cycles number 5, 8, 11, and 12. 2. Metastatic colon cancer confirmed on a restaging CT evaluation 05/02/2011 with liver metastases and periportal lymphadenopathy. She began treatment with FOLFIRI on 06/01/2011. Avastin was added beginning with cycle number 2. She  completed cycle #6 08/08/2011. Restaging CT evaluation 08/15/2011 showed improvement in the liver metastases and porta hepatis lymphadenopathy. She completed cycle #12 FOLFIRI/Avastin 11/07/2011. Restaging CT evaluation 11/24/2011 showed decrease in size of hepatic metastases and no evidence of disease progression or new metastasis. She completed cycle 18 on 03/11/2012. A restaging CT 03/29/2012 revealed stable disease. 3. Status post Port-A-Cath placement and Port-A-Cath removal by Dr. Michaell Cowing. A new Port-A-Cath was placed on 05/16/2011. The Port-A-Cath was removed due to malposition. A new Port-A-Cath was placed on 05/30/2011. 4. Spina bifida with severe scoliosis.  5. Chronic bilateral foot drop/lower leg and foot numbness. 6. Hypothyroidism. 7. History of chemotherapy-induced diarrhea. The bolus and infusional 5-FU were reduced by 25% beginning with cycle number 2 of FOLFOX. The 5-FU dose was decreased by an additional 10% beginning with cycle number 8. 8. History of delayed nausea following chemotherapy. 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 The oxaliplatin was dose reduced by 20% beginning with cycle number 6. The neuropathy symptoms resolved. 10. History of neutropenia secondary to chemotherapy. 11. Status post a colonoscopy 05/05/2010. There was a previous right colectomy anastomosis and slight nodularity. Biopsies were taken which showed benign colonic mucosa with hyperplastic epithelial changes. No significant inflammation, granuloma, or adenomatous epithelium was identified. 12. Diarrhea following FOLFIRI chemotherapy. Overall the diarrhea is controlled with Lomotil.  13. History of a fungal rash in the groin, improved with Diflucan and nystatin powder. 14. Mouth ulcers, oral candidiasis following cycle #6 of FOLFIRI-the ulcers resolved following treatment with Diflucan.  Disposition:  She appears stable. I reviewed  the restaging CT with April Spence and her husband.  She has been maintained on FOLFIRI/Avastin since September of 2012. The liver metastases/periportal adenopathy have improved and appears stable on the current CT. We discussed treatment options. We decided to continue 5-FU/leucovorin and Avastin. Irinotecan will be discontinued. It is unlikely she will achieve additional benefit by continuing irinotecan.  I will present her case at the GI tumor conference on 04/10/2012 to discuss the indication for surgery or radioablation of the remaining liver lesions.  She will return for an office visit and chemotherapy in 3 weeks.    April Papas, MD  04/02/2012  10:40 AM

## 2012-04-02 NOTE — Telephone Encounter (Signed)
04-04-2012 pump start and stop 04-23-2012 lab md chemo 04-27-2012 at 8:30am pump start stop

## 2012-04-04 ENCOUNTER — Ambulatory Visit (HOSPITAL_BASED_OUTPATIENT_CLINIC_OR_DEPARTMENT_OTHER): Payer: Medicare HMO

## 2012-04-04 VITALS — BP 143/94 | HR 73 | Temp 97.9°F

## 2012-04-04 DIAGNOSIS — C189 Malignant neoplasm of colon, unspecified: Secondary | ICD-10-CM

## 2012-04-04 DIAGNOSIS — Z452 Encounter for adjustment and management of vascular access device: Secondary | ICD-10-CM

## 2012-04-04 MED ORDER — HEPARIN SOD (PORK) LOCK FLUSH 100 UNIT/ML IV SOLN
500.0000 [IU] | Freq: Once | INTRAVENOUS | Status: AC
  Administered 2012-04-04: 500 [IU]
  Filled 2012-04-04: qty 5

## 2012-04-04 MED ORDER — SODIUM CHLORIDE 0.9 % IJ SOLN
10.0000 mL | INTRAMUSCULAR | Status: DC | PRN
  Administered 2012-04-04: 10 mL
  Filled 2012-04-04: qty 10

## 2012-04-04 NOTE — Patient Instructions (Signed)
Call MD for problems 

## 2012-04-09 ENCOUNTER — Other Ambulatory Visit: Payer: Self-pay | Admitting: Gynecology

## 2012-04-09 DIAGNOSIS — Z1231 Encounter for screening mammogram for malignant neoplasm of breast: Secondary | ICD-10-CM

## 2012-04-21 ENCOUNTER — Other Ambulatory Visit: Payer: Self-pay | Admitting: Oncology

## 2012-04-23 ENCOUNTER — Telehealth: Payer: Self-pay | Admitting: Oncology

## 2012-04-23 ENCOUNTER — Other Ambulatory Visit: Payer: Self-pay | Admitting: Nurse Practitioner

## 2012-04-23 ENCOUNTER — Telehealth: Payer: Self-pay | Admitting: *Deleted

## 2012-04-23 ENCOUNTER — Ambulatory Visit (HOSPITAL_BASED_OUTPATIENT_CLINIC_OR_DEPARTMENT_OTHER): Payer: Medicare HMO

## 2012-04-23 ENCOUNTER — Ambulatory Visit (HOSPITAL_BASED_OUTPATIENT_CLINIC_OR_DEPARTMENT_OTHER): Payer: Medicare HMO | Admitting: Nurse Practitioner

## 2012-04-23 ENCOUNTER — Other Ambulatory Visit (HOSPITAL_BASED_OUTPATIENT_CLINIC_OR_DEPARTMENT_OTHER): Payer: Medicare HMO | Admitting: Lab

## 2012-04-23 VITALS — BP 112/73 | HR 73 | Temp 97.1°F | Resp 18 | Ht 60.0 in | Wt 140.3 lb

## 2012-04-23 DIAGNOSIS — C787 Secondary malignant neoplasm of liver and intrahepatic bile duct: Secondary | ICD-10-CM

## 2012-04-23 DIAGNOSIS — C189 Malignant neoplasm of colon, unspecified: Secondary | ICD-10-CM

## 2012-04-23 DIAGNOSIS — D6481 Anemia due to antineoplastic chemotherapy: Secondary | ICD-10-CM

## 2012-04-23 DIAGNOSIS — Z5111 Encounter for antineoplastic chemotherapy: Secondary | ICD-10-CM

## 2012-04-23 DIAGNOSIS — C180 Malignant neoplasm of cecum: Secondary | ICD-10-CM

## 2012-04-23 DIAGNOSIS — Q059 Spina bifida, unspecified: Secondary | ICD-10-CM

## 2012-04-23 DIAGNOSIS — T451X5A Adverse effect of antineoplastic and immunosuppressive drugs, initial encounter: Secondary | ICD-10-CM

## 2012-04-23 DIAGNOSIS — Z5112 Encounter for antineoplastic immunotherapy: Secondary | ICD-10-CM

## 2012-04-23 LAB — CBC WITH DIFFERENTIAL/PLATELET
BASO%: 0.2 % (ref 0.0–2.0)
LYMPH%: 32.3 % (ref 14.0–49.7)
MCHC: 33.2 g/dL (ref 31.5–36.0)
MONO#: 0.5 10*3/uL (ref 0.1–0.9)
MONO%: 9.2 % (ref 0.0–14.0)
Platelets: 218 10*3/uL (ref 145–400)
RBC: 4.18 10*6/uL (ref 3.70–5.45)
RDW: 14.3 % (ref 11.2–14.5)
WBC: 5 10*3/uL (ref 3.9–10.3)
nRBC: 0 % (ref 0–0)

## 2012-04-23 LAB — CEA: CEA: 7 ng/mL — ABNORMAL HIGH (ref 0.0–5.0)

## 2012-04-23 MED ORDER — ONDANSETRON 8 MG/50ML IVPB (CHCC)
8.0000 mg | Freq: Once | INTRAVENOUS | Status: AC
  Administered 2012-04-23: 8 mg via INTRAVENOUS

## 2012-04-23 MED ORDER — SODIUM CHLORIDE 0.9 % IV SOLN
1475.0000 mg/m2 | INTRAVENOUS | Status: DC
  Administered 2012-04-23: 2600 mg via INTRAVENOUS
  Filled 2012-04-23: qty 52

## 2012-04-23 MED ORDER — SODIUM CHLORIDE 0.9 % IV SOLN: Freq: Once | INTRAVENOUS | Status: DC

## 2012-04-23 MED ORDER — SODIUM CHLORIDE 0.9 % IV SOLN
5.0000 mg/kg | Freq: Once | INTRAVENOUS | Status: AC
  Administered 2012-04-23: 325 mg via INTRAVENOUS
  Filled 2012-04-23: qty 13

## 2012-04-23 MED ORDER — LEUCOVORIN CALCIUM INJECTION 350 MG
375.0000 mg/m2 | Freq: Once | INTRAVENOUS | Status: AC
  Administered 2012-04-23: 660 mg via INTRAVENOUS
  Filled 2012-04-23: qty 33

## 2012-04-23 MED ORDER — FLUOROURACIL CHEMO INJECTION 500 MG/10ML
250.0000 mg/m2 | Freq: Once | INTRAVENOUS | Status: AC
  Administered 2012-04-23: 450 mg via INTRAVENOUS
  Filled 2012-04-23: qty 9

## 2012-04-23 MED ORDER — LIDOCAINE-PRILOCAINE 2.5-2.5 % EX CREA: TOPICAL_CREAM | CUTANEOUS | Status: DC | PRN

## 2012-04-23 NOTE — Patient Instructions (Addendum)
[facility] Discharge Instructions for Patients Receiving Chemotherapy  Today you received the following chemotherapy agents: 5FU, Leucovorin, Avastin.  If you develop nausea and vomiting that is not controlled by your nausea medication, call the clinic. If it is after clinic hours your family physician or the after hours number for the clinic or go to the Emergency Department.   BELOW ARE SYMPTOMS THAT SHOULD BE REPORTED IMMEDIATELY:  *FEVER GREATER THAN 100.5 F  *CHILLS WITH OR WITHOUT FEVER  NAUSEA AND VOMITING THAT IS NOT CONTROLLED WITH YOUR NAUSEA MEDICATION  *UNUSUAL SHORTNESS OF BREATH  *UNUSUAL BRUISING OR BLEEDING  TENDERNESS IN MOUTH AND THROAT WITH OR WITHOUT PRESENCE OF ULCERS  *URINARY PROBLEMS  *BOWEL PROBLEMS  UNUSUAL RASH Items with * indicate a potential emergency and should be followed up as soon as possible.  Feel free to call the clinic you have any questions or concerns. The clinic phone number is [phone].

## 2012-04-23 NOTE — Telephone Encounter (Signed)
Per staff message and POF I have scheduled appts.  JMW  

## 2012-04-23 NOTE — Telephone Encounter (Signed)
Gave pt appt for September 2013 lab,and MD, emailed Marcelino Duster regarding chemo, gave referral to HIM for a referral @ [facility],Dr. Sharyn Dross

## 2012-04-23 NOTE — Progress Notes (Signed)
OFFICE PROGRESS NOTE  Interval history:  [name] returns as scheduled. She is currently being treated with 5-FU/leucovorin and Avastin on a 3 week schedule. She was last treated 04/02/2012. She had nausea lasting for 3 days following the most recent chemotherapy. No vomiting. The diarrhea was better. She had a few mouth sores which have resolved. She denies bleeding. No shortness of breath or chest pain. No change in chronic leg edema.   Objective: Blood pressure 112/73, pulse 73, temperature 97.1 F (36.2 C), temperature source Oral, resp. rate 18, height 5' (1.524 m), weight 140 lb 4.8 oz (63.64 kg).  Oropharynx is without thrush or ulceration. No palpable cervical or supraclavicular lymph nodes. Lungs with faint inspiratory rales at the lung bases. Regular cardiac rhythm. Port-A-Cath site is without erythema. Abdomen is soft and nontender. No hepatomegaly. Trace lower leg edema bilaterally.  Lab Results: Lab Results  Component Value Date   WBC 5.0 04/23/2012   HGB 13.3 04/23/2012   HCT 40.1 04/23/2012   MCV 95.9 04/23/2012   PLT 218 04/23/2012    Chemistry:    Chemistry      Component Value Date/Time   NA 135 04/02/2012 0937   NA 136 05/02/2011 0954   K 3.8 04/02/2012 0937   K 4.5 05/02/2011 0954   CL 101 04/02/2012 0937   CL 93* 05/02/2011 0954   CO2 26 04/02/2012 0937   CO2 29 05/02/2011 0954   BUN 11 04/02/2012 0937   BUN 12 05/02/2011 0954   CREATININE 0.42* 04/02/2012 0937   CREATININE 0.5* 05/02/2011 0954      Component Value Date/Time   CALCIUM 9.8 04/02/2012 0937   CALCIUM 9.3 05/02/2011 0954   ALKPHOS 66 04/02/2012 0937   ALKPHOS 78 05/02/2011 0954   AST 34 04/02/2012 0937   AST 31 05/02/2011 0954   ALT 29 04/02/2012 0937   BILITOT 0.2* 04/02/2012 0937   BILITOT 0.40 05/02/2011 0954       Studies/Results: Ct Chest W Contrast  03/29/2012  *RADIOLOGY REPORT*  Clinical Data:  Restaging colon cancer.  Follow up of liver lesions.  History of stage III adenocarcinoma cecum.   Status post right colectomy 05/03/2009.  Status post 12 cycles of chemotherapy. Liver metastasis and periportal lymphadenopathy diagnosed 05/02/2011.  CT CHEST, ABDOMEN AND PELVIS WITH CONTRAST  Technique: Contiguous axial images of the chest abdomen and pelvis were obtained after IV contrast administration.  Contrast: 100  ml Omnipaque-300  Comparison: 11/24/2011 and clinic note of 03/11/2012.  CT CHEST  Findings: Lung windows demonstrate 3 mm subpleural right upper lobe lung nodule on image 22 which is similar to on image 23 of the prior exam.  Soft tissue windows demonstrate no supraclavicular adenopathy. Left-sided Port-A-Cath which terminates at the low SVC or cavoatrial junction.  The  A left-sided thyroid nodule which measures 2.5 x 1.7 cm on image 26 versus 2.5 x 1.6 cm on the prior exam.  Borderline cardiomegaly, without pericardial or pleural effusion. No central pulmonary embolism, on this non-dedicated study.  Low right paratracheal node measures 6 mm today versus 4 mm on the prior, but is not pathologic by size criteria.  No hilar adenopathy.  Air-fluid level of thoracic esophagus on image 24.  Anatomic distortion by marked spinal curvature.  IMPRESSION:  1. No acute process or evidence of metastatic disease in the chest. 2.  Similar 3 mm right upper lobe lung nodule. 3. Esophageal air fluid level suggests dysmotility or gastroesophageal reflux. 4.  Similar left-sided thyroid nodule.  CT ABDOMEN AND PELVIS  Findings:  Underlying marked hepatic steatosis.  Similar wedge shaped area of relative hyperattenuation on image 41 which could represent a perfusion anomaly.  There is also concurrent intrahepatic ductal dilatation within this area.  This extends to the level of the central right hepatic lobe metastasis.  This measures 3.5 x 3.1 cm on image 47 versus 3.3 x 2.8 cm on the prior exam.  Felt to be a minimally enlarged.  The lesion in the left lobe liver is not well demarcated today.  Possibly present on  image 48 and appears similar to on the prior exam.  No new lesions are identified.  The splenules.  There is likely a descending duodenal diverticulum. Normal pancreas.  Cholecystectomy.  Central intrapelvic ducts upper normal, unchanged.  Normal adrenal glands.  Normal kidneys.  Small retroperitoneal nodes, without adenopathy.  No retrocrural adenopathy.  Colonic stool burden suggests constipation.  Partial right hemicolectomy. Normal small bowel without abdominal ascites.    No evidence of omental or peritoneal disease.  Small pelvic sidewall nodes are unchanged.  Pelvic floor laxity. Otherwise normal urinary bladder.  Dystrophic calcifications within the left-sided uterine fibroid. Prominent left gonadal vein. No adnexal mass or significant free fluid.  Mild osteopenia.  Marked spinal dysraphism with severe spinal curvature.  IMPRESSION:  1.  Minimal enlargement of a right liver lobe metastasis.  The other liver lesion is not well visualized but felt to be similar. 2.  Hepatic steatosis.  Mild biliary obstruction and likely secondary perfusion anomaly within the more peripheral right lobe of the liver.  Similar. 3.  No evidence of extrahepatic metastatic disease within the abdomen or pelvis. 4.  Possible constipation. 5.  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 6.  Pelvic floor laxity. 7.  Uterine fibroid.  Original Report Authenticated By: Consuello Bossier, M.D.   Ct Abdomen Pelvis W Contrast  03/29/2012  *RADIOLOGY REPORT*  Clinical Data:  Restaging colon cancer.  Follow up of liver lesions.  History of stage III adenocarcinoma cecum.  Status post right colectomy 05/03/2009.  Status post 12 cycles of chemotherapy. Liver metastasis and periportal lymphadenopathy diagnosed 05/02/2011.  CT CHEST, ABDOMEN AND PELVIS WITH CONTRAST  Technique: Contiguous axial images of the chest abdomen and pelvis were obtained after IV contrast administration.  Contrast: 100  ml Omnipaque-300   Comparison: 11/24/2011 and clinic note of 03/11/2012.  CT CHEST  Findings: Lung windows demonstrate 3 mm subpleural right upper lobe lung nodule on image 22 which is similar to on image 23 of the prior exam.  Soft tissue windows demonstrate no supraclavicular adenopathy. Left-sided Port-A-Cath which terminates at the low SVC or cavoatrial junction.  The  A left-sided thyroid nodule which measures 2.5 x 1.7 cm on image 26 versus 2.5 x 1.6 cm on the prior exam.  Borderline cardiomegaly, without pericardial or pleural effusion. No central pulmonary embolism, on this non-dedicated study.  Low right paratracheal node measures 6 mm today versus 4 mm on the prior, but is not pathologic by size criteria.  No hilar adenopathy.  Air-fluid level of thoracic esophagus on image 24.  Anatomic distortion by marked spinal curvature.  IMPRESSION:  1. No acute process or evidence of metastatic disease in the chest. 2.  Similar 3 mm right upper lobe lung nodule. 3. Esophageal air fluid level suggests dysmotility or gastroesophageal reflux. 4.  Similar left-sided thyroid nodule.  CT ABDOMEN AND PELVIS  Findings:  Underlying marked hepatic steatosis.  Similar wedge  shaped area of relative hyperattenuation on image 41 which could represent a perfusion anomaly.  There is also concurrent intrahepatic ductal dilatation within this area.  This extends to the level of the central right hepatic lobe metastasis.  This measures 3.5 x 3.1 cm on image 47 versus 3.3 x 2.8 cm on the prior exam.  Felt to be a minimally enlarged.  The lesion in the left lobe liver is not well demarcated today.  Possibly present on image 48 and appears similar to on the prior exam.  No new lesions are identified.  The splenules.  There is likely a descending duodenal diverticulum. Normal pancreas.  Cholecystectomy.  Central intrapelvic ducts upper normal, unchanged.  Normal adrenal glands.  Normal kidneys.  Small retroperitoneal nodes, without adenopathy.  No  retrocrural adenopathy.  Colonic stool burden suggests constipation.  Partial right hemicolectomy. Normal small bowel without abdominal ascites.    No evidence of omental or peritoneal disease.  Small pelvic sidewall nodes are unchanged.  Pelvic floor laxity. Otherwise normal urinary bladder.  Dystrophic calcifications within the left-sided uterine fibroid. Prominent left gonadal vein. No adnexal mass or significant free fluid.  Mild osteopenia.  Marked spinal dysraphism with severe spinal curvature.  IMPRESSION:  1.  Minimal enlargement of a right liver lobe metastasis.  The other liver lesion is not well visualized but felt to be similar. 2.  Hepatic steatosis.  Mild biliary obstruction and likely secondary perfusion anomaly within the more peripheral right lobe of the liver.  Similar. 3.  No evidence of extrahepatic metastatic disease within the abdomen or pelvis. 4.  Possible constipation. 5.  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 6.  Pelvic floor laxity. 7.  Uterine fibroid.  Original Report Authenticated By: Consuello Bossier, M.D.    Medications: I have reviewed the patient's current medications.  Assessment/Plan:  1. 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 She completed 12 cycles of FOLFOX chemotherapy. Oxaliplatin was held with cycles number 5, 8, 11, and 12. 2. Metastatic colon cancer confirmed on a restaging CT evaluation 05/02/2011 with liver metastases and periportal lymphadenopathy. She began treatment with FOLFIRI on 06/01/2011. Avastin was added beginning with cycle number 2. She completed cycle #6 08/08/2011. Restaging CT evaluation 08/15/2011 showed improvement in the liver metastases and porta hepatis lymphadenopathy. She completed cycle #12 FOLFIRI/Avastin 11/07/2011. Restaging CT evaluation 11/24/2011 showed decrease in size of hepatic  metastases and no evidence of disease progression or new metastasis. She completed cycle 18 on 03/11/2012. A restaging CT 03/29/2012 revealed stable disease. Irinotecan was discontinued and treatment continued with 5-FU/leucovorin and Avastin on a 3 week schedule beginning 04/02/2012. 3. Status post Port-A-Cath placement and Port-A-Cath removal by Dr. Michaell Cowing. A new Port-A-Cath was placed on 05/16/2011. The Port-A-Cath was removed due to malposition. A new Port-A-Cath was placed on 05/30/2011. 4. Spina bifida with severe scoliosis.  5. Chronic bilateral foot drop/lower leg and foot numbness. 6. Hypothyroidism. 7. History of chemotherapy-induced diarrhea. The bolus and infusional 5-FU were reduced by 25% beginning with cycle number 2 of FOLFOX. The 5-FU dose was decreased by an additional 10% beginning with cycle number 8. 8. History of delayed nausea following chemotherapy. 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 The oxaliplatin was dose reduced by 20% beginning with cycle number 6. The neuropathy symptoms resolved. 10. History of neutropenia secondary to chemotherapy. 11.  Status post a colonoscopy 05/05/2010. There was a previous right colectomy anastomosis and slight nodularity. Biopsies were taken which showed benign colonic mucosa with hyperplastic epithelial changes. No significant inflammation, granuloma, or adenomatous epithelium was identified. 12. Diarrhea following FOLFIRI chemotherapy. Overall the diarrhea is controlled with Lomotil.  13. History of a fungal rash in the groin, improved with Diflucan and nystatin powder. 14. Mouth ulcers, oral candidiasis following cycle #6 of FOLFIRI-the ulcers resolved following treatment with Diflucan.  Disposition-April Spence appears stable. Plan to continue 5-FU/leucovorin and Avastin on a 3 week schedule. Dr. Truett Perna presented her case at the recent GI tumor conference. She is not felt to have resectable disease. She is interested in  a second opinion. We are making a referral to Dr. Rudolpho Sevin at [facility]. She will return for a followup visit and the next cycle of chemotherapy in 3 weeks. She will contact the office in the interim with any problems.  Plan reviewed with Dr. Truett Perna.  April Spence ANP/GNP-BC

## 2012-04-25 ENCOUNTER — Ambulatory Visit (HOSPITAL_BASED_OUTPATIENT_CLINIC_OR_DEPARTMENT_OTHER): Payer: Medicare HMO

## 2012-04-25 VITALS — BP 124/79 | HR 78 | Temp 97.1°F

## 2012-04-25 DIAGNOSIS — C787 Secondary malignant neoplasm of liver and intrahepatic bile duct: Secondary | ICD-10-CM

## 2012-04-25 DIAGNOSIS — C180 Malignant neoplasm of cecum: Secondary | ICD-10-CM

## 2012-04-25 DIAGNOSIS — C189 Malignant neoplasm of colon, unspecified: Secondary | ICD-10-CM

## 2012-04-25 DIAGNOSIS — Z452 Encounter for adjustment and management of vascular access device: Secondary | ICD-10-CM

## 2012-04-25 MED ORDER — HEPARIN SOD (PORK) LOCK FLUSH 100 UNIT/ML IV SOLN
500.0000 [IU] | Freq: Once | INTRAVENOUS | Status: AC
  Administered 2012-04-25: 500 [IU]
  Filled 2012-04-25: qty 5

## 2012-04-25 MED ORDER — SODIUM CHLORIDE 0.9 % IJ SOLN
10.0000 mL | INTRAMUSCULAR | Status: DC | PRN
  Administered 2012-04-25: 10 mL
  Filled 2012-04-25: qty 10

## 2012-04-26 ENCOUNTER — Telehealth: Payer: Self-pay | Admitting: *Deleted

## 2012-04-26 ENCOUNTER — Other Ambulatory Visit: Payer: Self-pay | Admitting: Certified Registered Nurse Anesthetist

## 2012-04-26 NOTE — Telephone Encounter (Signed)
Patient called to follow up on status of referral to Dr. Jola Babinski at [facility]. Forwarded message to HIM department.

## 2012-04-29 ENCOUNTER — Ambulatory Visit
Admission: RE | Admit: 2012-04-29 | Discharge: 2012-04-29 | Disposition: A | Discharge status: Home / Self Care | Payer: Medicare HMO | Source: Ambulatory Visit | Attending: Gynecology | Admitting: Gynecology

## 2012-04-29 DIAGNOSIS — Z1231 Encounter for screening mammogram for malignant neoplasm of breast: Secondary | ICD-10-CM

## 2012-04-30 ENCOUNTER — Telehealth: Payer: Self-pay | Admitting: Oncology

## 2012-04-30 NOTE — Telephone Encounter (Signed)
Pt has appt. With Dr. Loyce Dys @ [facility] on 05/07/12. Medical records faxed. Scans and slides will be fedex'ed. Pt is aware.

## 2012-05-01 ENCOUNTER — Other Ambulatory Visit: Payer: Self-pay | Admitting: Gynecology

## 2012-05-01 ENCOUNTER — Other Ambulatory Visit: Payer: Self-pay | Admitting: *Deleted

## 2012-05-01 DIAGNOSIS — C189 Malignant neoplasm of colon, unspecified: Secondary | ICD-10-CM

## 2012-05-01 DIAGNOSIS — R928 Other abnormal and inconclusive findings on diagnostic imaging of breast: Secondary | ICD-10-CM

## 2012-05-01 MED ORDER — LORAZEPAM 0.5 MG PO TABS: 0.5000 mg | ORAL_TABLET | Freq: Two times a day (BID) | ORAL | Status: DC | PRN

## 2012-05-06 ENCOUNTER — Ambulatory Visit
Admission: RE | Admit: 2012-05-06 | Discharge: 2012-05-06 | Disposition: A | Discharge status: Home / Self Care | Payer: Medicare HMO | Source: Ambulatory Visit | Attending: Gynecology | Admitting: Gynecology

## 2012-05-06 DIAGNOSIS — R928 Other abnormal and inconclusive findings on diagnostic imaging of breast: Secondary | ICD-10-CM

## 2012-05-13 ENCOUNTER — Other Ambulatory Visit: Payer: Self-pay | Admitting: Oncology

## 2012-05-14 ENCOUNTER — Other Ambulatory Visit (HOSPITAL_BASED_OUTPATIENT_CLINIC_OR_DEPARTMENT_OTHER): Payer: Medicare HMO | Admitting: Lab

## 2012-05-14 ENCOUNTER — Telehealth: Payer: Self-pay | Admitting: Oncology

## 2012-05-14 ENCOUNTER — Ambulatory Visit: Payer: Medicare HMO

## 2012-05-14 ENCOUNTER — Ambulatory Visit (HOSPITAL_BASED_OUTPATIENT_CLINIC_OR_DEPARTMENT_OTHER): Payer: Medicare HMO | Admitting: Oncology

## 2012-05-14 ENCOUNTER — Telehealth: Payer: Self-pay | Admitting: *Deleted

## 2012-05-14 VITALS — BP 130/77 | HR 72 | Temp 97.0°F | Resp 18 | Ht 60.0 in | Wt 139.6 lb

## 2012-05-14 DIAGNOSIS — C180 Malignant neoplasm of cecum: Secondary | ICD-10-CM

## 2012-05-14 DIAGNOSIS — C189 Malignant neoplasm of colon, unspecified: Secondary | ICD-10-CM

## 2012-05-14 DIAGNOSIS — G609 Hereditary and idiopathic neuropathy, unspecified: Secondary | ICD-10-CM

## 2012-05-14 DIAGNOSIS — R197 Diarrhea, unspecified: Secondary | ICD-10-CM

## 2012-05-14 DIAGNOSIS — C787 Secondary malignant neoplasm of liver and intrahepatic bile duct: Secondary | ICD-10-CM

## 2012-05-14 LAB — COMPREHENSIVE METABOLIC PANEL (CC13)
ALT: 43 U/L (ref 0–55)
Albumin: 3.5 g/dL (ref 3.5–5.0)
Alkaline Phosphatase: 77 U/L (ref 40–150)
Glucose: 121 mg/dl — ABNORMAL HIGH (ref 70–99)
Potassium: 3.9 mEq/L (ref 3.5–5.1)
Sodium: 140 mEq/L (ref 136–145)
Total Protein: 7.5 g/dL (ref 6.4–8.3)

## 2012-05-14 LAB — CBC WITH DIFFERENTIAL/PLATELET
Eosinophils Absolute: 0.1 10*3/uL (ref 0.0–0.5)
MCV: 96.8 fL (ref 79.5–101.0)
MONO#: 0.7 10*3/uL (ref 0.1–0.9)
MONO%: 10.1 % (ref 0.0–14.0)
NEUT#: 4.2 10*3/uL (ref 1.5–6.5)
RBC: 4.03 10*6/uL (ref 3.70–5.45)
RDW: 14.4 % (ref 11.2–14.5)
WBC: 6.6 10*3/uL (ref 3.9–10.3)

## 2012-05-14 NOTE — Telephone Encounter (Signed)
Per staff message and POF I have scheduled appts.  JMW  

## 2012-05-14 NOTE — Progress Notes (Signed)
[facility]    OFFICE PROGRESS NOTE   INTERVAL HISTORY:   She returns as scheduled. No new complaint. She again had nausea and diarrhea following 5-fluorouracil chemotherapy on 04/23/2012, but this was better than with previous cycles.  She saw Dr. Jola Babinski at [facility] last week. She reports he feels the metastatic disease in the liver is potentially resectable. She is considering this option.  Objective:  Vital signs in last 24 hours:  Blood pressure 130/77, pulse 72, temperature 97 F (36.1 C), temperature source Oral, resp. rate 18, height 5' (1.524 m), weight 139 lb 9.6 oz (63.322 kg).   Resp: Decreased breath sounds with inspiratory rhonchi at the left greater than right base, no respiratory distress Cardio: Regular rate and rhythm GI: No hepatosplenomegaly, nontender Vascular: No leg edema Lymph nodes: No cervical, supraclavicular, or axillary nodes    Portacath/PICC-without erythema  Lab Results:  Lab Results  Component Value Date   WBC 6.6 05/14/2012   HGB 13.0 05/14/2012   HCT 39.0 05/14/2012   MCV 96.8 05/14/2012   PLT 201 05/14/2012   ANC 4.2    Medications: I have reviewed the patient's current medications.  Assessment/Plan: 1. 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 She completed 12 cycles of FOLFOX chemotherapy. Oxaliplatin was held with cycles number 5, 8, 11, and 12. 2. Metastatic colon cancer confirmed on a restaging CT evaluation 05/02/2011 with liver metastases and periportal lymphadenopathy. She began treatment with FOLFIRI on 06/01/2011. Avastin was added beginning with cycle number 2. She completed cycle #6 08/08/2011. Restaging CT evaluation 08/15/2011 showed improvement in the liver metastases and porta hepatis lymphadenopathy. She completed cycle #12 FOLFIRI/Avastin 11/07/2011. Restaging CT evaluation 11/24/2011 showed  decrease in size of hepatic metastases and no evidence of disease progression or new metastasis. She completed cycle 18 on 03/11/2012. A restaging CT 03/29/2012 revealed stable disease. Irinotecan was discontinued and treatment continued with 5-FU/leucovorin and Avastin on a 3 week schedule beginning 04/02/2012. Last chemotherapy given 04/23/2012 to 3. Status post Port-A-Cath placement and Port-A-Cath removal by Dr. Michaell Cowing. A new Port-A-Cath was placed on 05/16/2011. The Port-A-Cath was removed due to malposition. A new Port-A-Cath was placed on 05/30/2011. 4. Spina bifida with severe scoliosis.  5. Chronic bilateral foot drop/lower leg and foot numbness. 6. Hypothyroidism. 7. History of chemotherapy-induced diarrhea. The bolus and infusional 5-FU were reduced by 25% beginning with cycle number 2 of FOLFOX. The 5-FU dose was decreased by an additional 10% beginning with cycle number 8. 8. History of delayed nausea following chemotherapy. 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 The oxaliplatin was dose reduced by 20% beginning with cycle number 6. The neuropathy symptoms resolved. 10. History of neutropenia secondary to chemotherapy. 11. Status post a colonoscopy 05/05/2010. There was a previous right colectomy anastomosis and slight nodularity. Biopsies were taken which showed benign colonic mucosa with hyperplastic epithelial changes. No significant inflammation, granuloma, or adenomatous epithelium was identified. 12. Diarrhea following FOLFIRI chemotherapy. Overall the diarrhea is controlled with Lomotil.  13. History of a fungal rash in the groin, improved with Diflucan and nystatin powder. 14. Mouth ulcers, oral candidiasis following cycle #6 of FOLFIRI-the ulcers resolved following treatment with Diflucan.   Disposition:  She appears stable. She saw Dr. Jola Babinski and is considering a liver resection procedure. I discussed treatment options at length with April Spence and her  husband.  She understands treatment with systemic therapy will not be curative, but may offer a prolonged clinical remission. This may last on average for a few years. Hepatic resection offers a small chance of cure or a long-term disease-free survival. We discussed the likelihood of extrahepatic disease based on the CT scans when she presented with recurrent colon cancer.  She requested another surgical opinion. We will schedule an appointment with Dr. Donell Beers. Chemotherapy and Avastin have been placed on hold for now. Ms. Crownover will return for an office visit and scheduled chemotherapy in 2 weeks. I will contact Dr. Jola Babinski to discuss the case.   Thornton Papas, MD  05/14/2012  4:09 PM

## 2012-05-14 NOTE — Telephone Encounter (Signed)
gve the pt her sept 2013 appt calendar along with the appt to see dr Donell Beers @[facility] . Sent michelle a staff message to add the chemo. Pt is aware

## 2012-05-17 ENCOUNTER — Encounter (INDEPENDENT_AMBULATORY_CARE_PROVIDER_SITE_OTHER): Payer: Self-pay | Admitting: General Surgery

## 2012-05-17 ENCOUNTER — Ambulatory Visit (INDEPENDENT_AMBULATORY_CARE_PROVIDER_SITE_OTHER): Payer: Medicare HMO | Admitting: General Surgery

## 2012-05-17 VITALS — BP 128/62 | HR 64 | Temp 98.0°F | Resp 18 | Ht 60.0 in | Wt 139.0 lb

## 2012-05-17 DIAGNOSIS — C189 Malignant neoplasm of colon, unspecified: Secondary | ICD-10-CM

## 2012-05-17 NOTE — Assessment & Plan Note (Addendum)
Pt with metastatic colon cancer that is bilobar.  The left sided lesion is in a very difficult location and would possibly not be resectable.  She has seen Dr. Jola Babinski at [facility], and he has a similar view.    If the left lesion is resectable, there is still a chance that the margins would be positive, as it would need to be peeled away from the portal vein confluence.    I discussed other possible options including microwave ablation for the right lesion and possible radiosurgery for the left lesion.  This is still potentially problematic.    She is going to continue on chemotherapy for now as she is having stable disease and minimal side effects.

## 2012-05-17 NOTE — Progress Notes (Signed)
Chief Complaint  Patient presents with  . Follow-up    Gall Bladder    HISTORY: Patient is a 56 year old female with stage IV colon cancer referred by Dr. Truett Perna. She underwent a colectomy back in 2010. She was diagnosed with liver disease last year. She has been on chemotherapy since that time. This has included Avastin.  She has developed a dominant right-sided lesion and a very small lesion on the left that appears to be in the parenchyma just adjacent to the portal vein confluence. There is some suggestion that this may be a portal lymph node on other studies however. She is referred for consideration of surgical therapy. She saw Dr. Jola Babinski at [facility] to evaluate.  She has been doing well on chemotherapy and has stable disease. She has not had dramatic response but is not progressing.  She is not having any significant side effects. Her energy level is good. She has had a colonoscopy post surgery and has no additional polyps.  Past Medical History  Diagnosis Date  . colon ca dx'd 04/2009    chemo comp 11/2009.. colon and liver  . Spina bifida   . Scoliosis   . Hyperlipidemia   . Foot drop   . Spina bifida   . Arrhythmia   . Mitral valve prolapse   . Hypothyroidism   . Depression     Past Surgical History  Procedure Date  . Cholecystectomy   . Back surgery 1980 and 2010  . Knee arthroscopy 2005    left  . Tubal ligation 1973  . Colonoscopy   . Colon surgery     2010  . Portacath placement 05/30/2011    tip at cavoatrialjjunction by xray    Current Outpatient Prescriptions  Medication Sig Dispense Refill  . amitriptyline (ELAVIL) 25 MG tablet daily.      . Cholecalciferol (VITAMIN D PO) Take by mouth.        . Cyanocobalamin (VITAMIN B-12 IJ) Inject as directed every 14 (fourteen) days.       . diphenoxylate-atropine (LOMOTIL) 2.5-0.025 MG per tablet ONE TO TWO TABS FOUR TIMES A DAY PRN.  120 tablet  3  . fenofibrate 54 MG tablet Take 134 mg by mouth daily.       Marland Kitchen  FLUoxetine (PROZAC) 20 MG capsule       . levothyroxine (SYNTHROID, LEVOTHROID) 100 MCG tablet Take 125 mcg by mouth daily.       Marland Kitchen lidocaine-prilocaine (EMLA) cream Apply topically as needed. APPLY TO PORTACATH SITE 1-2 HOURS PRIOR TO USE  30 g  5  . LORazepam (ATIVAN) 0.5 MG tablet Take 1 tablet (0.5 mg total) by mouth every 12 (twelve) hours as needed for anxiety.  30 tablet  0  . nystatin (NYSTOP) 100000 UNIT/GM POWD Apply topically as needed.      Marland Kitchen oxyCODONE-acetaminophen (PERCOCET) 5-325 MG per tablet Take 1 tablet by mouth every 6 (six) hours as needed.      . prochlorperazine (COMPAZINE) 10 MG tablet Take 10 mg by mouth every 6 (six) hours as needed.      . propranolol (INDERAL) 80 MG tablet 80 mg daily.       . traMADol-acetaminophen (ULTRACET) 37.5-325 MG per tablet          Allergies  Allergen Reactions  . Adhesive (Tape)     Pt is fine with paper tape     Family History  Problem Relation Age of Onset  . Cancer Sister  breast  . Cancer Maternal Grandmother     breast  . Cancer Other 42    breast ca     History   Social History  . Marital Status: Married    Spouse Name: N/A    Number of Children: N/A  . Years of Education: N/A   Social History Main Topics  . Smoking status: Former Smoker    Quit date: 06/27/2006  . Smokeless tobacco: None  . Alcohol Use: Yes  . Drug Use: No  . Sexually Active:    REVIEW OF SYSTEMS - PERTINENT POSITIVES ONLY: 12 point review of systems negative other than HPI and PMH  EXAM: Filed Vitals:   05/17/12 0945  BP: 128/62  Pulse: 64  Temp: 98 F (36.7 C)  Resp: 18    Gen:  No acute distress.  Well nourished and well groomed.  In wheelchair for spina bifida/neuropathy.   Neurological: Alert and oriented to person, place, and time. Coordination normal.  Head: Normocephalic and atraumatic.  Eyes: Conjunctivae are normal. Pupils are equal, round, and reactive to light. No scleral icterus.  Neck: Normal range of motion.  Neck supple. No tracheal deviation or thyromegaly present.  Cardiovascular: Normal rate, regular rhythm, normal heart sounds and intact distal pulses.  Exam reveals no gallop and no friction rub.  No murmur heard. Respiratory: Effort normal.  No respiratory distress. No chest wall tenderness. Breath sounds normal.  No wheezes, rales or rhonchi.  GI: Soft. Bowel sounds are normal. The abdomen is soft and nontender.  There is no rebound and no guarding.  Musculoskeletal: Pt with contractures, scoliosis, in wheelchair  Lymphadenopathy: No cervical, preauricular, postauricular or axillary adenopathy is present Skin: Skin is warm and dry. No rash noted. No diaphoresis. No erythema. No pallor. No clubbing, cyanosis, or edema.   Psychiatric: Normal mood and affect. Behavior is normal. Judgment and thought content normal.    LABORATORY RESULTS: Available labs are reviewed    RADIOLOGY RESULTS: See E-Chart or I-Site for most recent results.  Images and reports are reviewed.    ASSESSMENT AND PLAN: Colon cancer Pt with metastatic colon cancer that is bilobar.  The left sided lesion is in a very difficult location and would possibly not be resectable.  She has seen Dr. Jola Babinski at [facility], and he has a similar view.    If the left lesion is resectable, there is still a chance that the margins would be positive, as it would need to be peeled away from the portal vein confluence.    I discussed other possible options including microwave ablation for the right lesion and possible radiosurgery for the left lesion.  This is still potentially problematic.    She is going to continue on chemotherapy for now as she is having stable disease and minimal side effects.      Maudry Diego MD Surgical Oncology, General and Endocrine Surgery [facility]      Visit Diagnoses: 1. Colon cancer     Primary Care Physician: Nadean Corwin, MD

## 2012-05-17 NOTE — Patient Instructions (Signed)
Continue chemo with Dr. Truett Perna.  Consider meeting with IR and/or XRT.

## 2012-05-26 ENCOUNTER — Other Ambulatory Visit: Payer: Self-pay | Admitting: Oncology

## 2012-05-27 ENCOUNTER — Other Ambulatory Visit: Payer: Self-pay | Admitting: *Deleted

## 2012-05-27 DIAGNOSIS — C189 Malignant neoplasm of colon, unspecified: Secondary | ICD-10-CM

## 2012-05-27 MED ORDER — LORAZEPAM 0.5 MG PO TABS: 0.5000 mg | ORAL_TABLET | Freq: Two times a day (BID) | ORAL | Status: DC | PRN

## 2012-05-28 ENCOUNTER — Ambulatory Visit (HOSPITAL_BASED_OUTPATIENT_CLINIC_OR_DEPARTMENT_OTHER): Payer: Medicare HMO

## 2012-05-28 ENCOUNTER — Other Ambulatory Visit (HOSPITAL_BASED_OUTPATIENT_CLINIC_OR_DEPARTMENT_OTHER): Payer: Medicare HMO | Admitting: Lab

## 2012-05-28 ENCOUNTER — Ambulatory Visit (HOSPITAL_BASED_OUTPATIENT_CLINIC_OR_DEPARTMENT_OTHER): Payer: Medicare HMO | Admitting: Nurse Practitioner

## 2012-05-28 ENCOUNTER — Telehealth: Payer: Self-pay | Admitting: Oncology

## 2012-05-28 VITALS — BP 125/75 | HR 71 | Temp 97.2°F | Resp 20 | Ht 60.0 in | Wt 140.6 lb

## 2012-05-28 DIAGNOSIS — C189 Malignant neoplasm of colon, unspecified: Secondary | ICD-10-CM

## 2012-05-28 DIAGNOSIS — C180 Malignant neoplasm of cecum: Secondary | ICD-10-CM

## 2012-05-28 DIAGNOSIS — C772 Secondary and unspecified malignant neoplasm of intra-abdominal lymph nodes: Secondary | ICD-10-CM

## 2012-05-28 DIAGNOSIS — C787 Secondary malignant neoplasm of liver and intrahepatic bile duct: Secondary | ICD-10-CM

## 2012-05-28 DIAGNOSIS — Z5111 Encounter for antineoplastic chemotherapy: Secondary | ICD-10-CM

## 2012-05-28 DIAGNOSIS — Z5112 Encounter for antineoplastic immunotherapy: Secondary | ICD-10-CM

## 2012-05-28 LAB — COMPREHENSIVE METABOLIC PANEL (CC13)
ALT: 25 U/L (ref 0–55)
AST: 27 U/L (ref 5–34)
Albumin: 3.3 g/dL — ABNORMAL LOW (ref 3.5–5.0)
Alkaline Phosphatase: 84 U/L (ref 40–150)
Potassium: 3.6 mEq/L (ref 3.5–5.1)
Sodium: 138 mEq/L (ref 136–145)
Total Bilirubin: 0.3 mg/dL (ref 0.20–1.20)
Total Protein: 7.6 g/dL (ref 6.4–8.3)

## 2012-05-28 LAB — CBC WITH DIFFERENTIAL/PLATELET
BASO%: 0.2 % (ref 0.0–2.0)
EOS%: 1.3 % (ref 0.0–7.0)
Eosinophils Absolute: 0.1 10*3/uL (ref 0.0–0.5)
MCH: 32.7 pg (ref 25.1–34.0)
MCHC: 33.8 g/dL (ref 31.5–36.0)
MCV: 96.7 fL (ref 79.5–101.0)
MONO%: 7.7 % (ref 0.0–14.0)
NEUT#: 4.4 10*3/uL (ref 1.5–6.5)
RBC: 3.95 10*6/uL (ref 3.70–5.45)
RDW: 13.9 % (ref 11.2–14.5)

## 2012-05-28 MED ORDER — SODIUM CHLORIDE 0.9 % IV SOLN
Freq: Once | INTRAVENOUS | Status: AC
  Administered 2012-05-28: 11:00:00 via INTRAVENOUS

## 2012-05-28 MED ORDER — ONDANSETRON 8 MG/50ML IVPB (CHCC)
8.0000 mg | Freq: Once | INTRAVENOUS | Status: AC
  Administered 2012-05-28: 8 mg via INTRAVENOUS

## 2012-05-28 MED ORDER — SODIUM CHLORIDE 0.9 % IV SOLN
5.0000 mg/kg | Freq: Once | INTRAVENOUS | Status: AC
  Administered 2012-05-28: 325 mg via INTRAVENOUS
  Filled 2012-05-28: qty 13

## 2012-05-28 MED ORDER — LEUCOVORIN CALCIUM INJECTION 350 MG
375.0000 mg/m2 | Freq: Once | INTRAVENOUS | Status: AC
  Administered 2012-05-28: 660 mg via INTRAVENOUS
  Filled 2012-05-28: qty 33

## 2012-05-28 MED ORDER — FLUOROURACIL CHEMO INJECTION 500 MG/10ML
250.0000 mg/m2 | Freq: Once | INTRAVENOUS | Status: AC
  Administered 2012-05-28: 450 mg via INTRAVENOUS
  Filled 2012-05-28: qty 9

## 2012-05-28 MED ORDER — SODIUM CHLORIDE 0.9 % IV SOLN
1475.0000 mg/m2 | INTRAVENOUS | Status: DC
  Administered 2012-05-28: 2600 mg via INTRAVENOUS
  Filled 2012-05-28: qty 52

## 2012-05-28 NOTE — Telephone Encounter (Signed)
Printed and made appt for pt. °

## 2012-05-28 NOTE — Patient Instructions (Signed)
[facility] Discharge Instructions for Patients Receiving Chemotherapy  Today you received the following chemotherapy agents 5 Fu/Leucovorin/Avastin To help prevent nausea and vomiting after your treatment, we encourage you to take your nausea medication as prescribed.  If you develop nausea and vomiting that is not controlled by your nausea medication, call the clinic. If it is after clinic hours your family physician or the after hours number for the clinic or go to the Emergency Department.   BELOW ARE SYMPTOMS THAT SHOULD BE REPORTED IMMEDIATELY:  *FEVER GREATER THAN 100.5 F  *CHILLS WITH OR WITHOUT FEVER  NAUSEA AND VOMITING THAT IS NOT CONTROLLED WITH YOUR NAUSEA MEDICATION  *UNUSUAL SHORTNESS OF BREATH  *UNUSUAL BRUISING OR BLEEDING  TENDERNESS IN MOUTH AND THROAT WITH OR WITHOUT PRESENCE OF ULCERS  *URINARY PROBLEMS  *BOWEL PROBLEMS  UNUSUAL RASH Items with * indicate a potential emergency and should be followed up as soon as possible.  One of the nurses will contact you 24 hours after your treatment. Please let the nurse know about any problems that you may have experienced. Feel free to call the clinic you have any questions or concerns. The clinic phone number is [phone].   I have been informed and understand all the instructions given to me. I know to contact the clinic, my physician, or go to the Emergency Department if any problems should occur. I do not have any questions at this time, but understand that I may call the clinic during office hours   should I have any questions or need assistance in obtaining follow up care.    __________________________________________  _____________  __________ Signature of Patient or Authorized Representative            Date                   Time    __________________________________________ Nurse's Signature    

## 2012-05-28 NOTE — Progress Notes (Signed)
OFFICE PROGRESS NOTE  Interval history:  April Spence returns as scheduled. She was last treated with 5-FU/leucovorin and Avastin on 04/23/2012.  She recently saw Dr. Donell Beers. She has decided to continue with chemotherapy.  April Spence reports that she feels well. She has occasional mild nausea. No recent diarrhea. Appetite is good. She denies bleeding. No shortness of breath or chest pain. No cough. No calf pain. Leg swelling is stable.   Objective: Blood pressure 125/75, pulse 71, temperature 97.2 F (36.2 C), resp. rate 20, height 5' (1.524 m), weight 140 lb 9.6 oz (63.776 kg).  Oropharynx is without thrush or ulceration. Lungs are clear. Regular cardiac rhythm. Port-A-Cath site is without erythema. Abdomen is soft and nontender. No organomegaly. Extremities without edema.   Lab Results: Lab Results  Component Value Date   WBC 6.4 05/28/2012   HGB 12.9 05/28/2012   HCT 38.2 05/28/2012   MCV 96.7 05/28/2012   PLT 231 05/28/2012    Chemistry:    Chemistry      Component Value Date/Time   NA 138 05/28/2012 0924   NA 135 04/02/2012 0937   NA 136 05/02/2011 0954   K 3.6 05/28/2012 0924   K 3.8 04/02/2012 0937   K 4.5 05/02/2011 0954   CL 103 05/28/2012 0924   CL 101 04/02/2012 0937   CL 93* 05/02/2011 0954   CO2 25 05/28/2012 0924   CO2 26 04/02/2012 0937   CO2 29 05/02/2011 0954   BUN 12.0 05/28/2012 0924   BUN 11 04/02/2012 0937   BUN 12 05/02/2011 0954   CREATININE 0.6 05/28/2012 0924   CREATININE 0.42* 04/02/2012 0937   CREATININE 0.5* 05/02/2011 0954      Component Value Date/Time   CALCIUM 10.1 05/28/2012 0924   CALCIUM 9.8 04/02/2012 0937   CALCIUM 9.3 05/02/2011 0954   ALKPHOS 84 05/28/2012 0924   ALKPHOS 66 04/02/2012 0937   ALKPHOS 78 05/02/2011 0954   AST 27 05/28/2012 0924   AST 34 04/02/2012 0937   AST 31 05/02/2011 0954   ALT 25 05/28/2012 0924   ALT 29 04/02/2012 0937   BILITOT 0.30 05/28/2012 0924   BILITOT 0.2* 04/02/2012 0937   BILITOT 0.40 05/02/2011 0954        Studies/Results: Mm Digital Diag Ltd R  05/06/2012  *RADIOLOGY REPORT*  Clinical Data:  Abnormal right screening mammogram  DIGITAL DIAGNOSTIC RIGHT MAMMOGRAM  Comparison:  With priors  Findings:  Spot compression views of the upper inner quadrant of the right breast were performed.  There is no persistent mass, distortion or malignant-type microcalcifications.  IMPRESSION: No evidence of malignancy in the right breast.  RECOMMENDATION: Screening mammogram in 1 year is recommended.  BI-RADS CATEGORY 1:  Negative.   Original Report Authenticated By: Littie Deeds. Judyann Munson, M.D.    Mm Digital Screening  04/29/2012  *RADIOLOGY REPORT*  Clinical Data: Screening.  DIGITAL BILATERAL SCREENING MAMMOGRAM WITH CAD  Comparison: Previous exams dated 06/08/2010, 06/02/2009, 01/17/2008, 12/14/2006.  Findings: Two views of each breast demonstrate heterogeneously dense tissue.  In the right breast, possible distortion warrants further evaluation with spot compression views and possibly ultrasound.  In the left breast, no suspicious masses or malignant type calcifications are identified.  Images were processed with CAD.  IMPRESSION: Further evaluation is suggested for distortion in the right breast.  RECOMMENDATION:  Diagnostic mammogram and possibly ultrasound of the right breast. (Code:[id number])  BI-RADS CATEGORY 0:  Incomplete.  Need additional imaging evaluation and/or prior mammograms for comparison.   Original  Report Authenticated By: Rolla Plate, M.D.     Medications: I have reviewed the patient's current medications.  Assessment/Plan:  1. 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 She completed 12 cycles of FOLFOX chemotherapy. Oxaliplatin was held with cycles number 5, 8, 11, and 12. 2. Metastatic colon cancer confirmed on a restaging CT evaluation 05/02/2011 with liver metastases and  periportal lymphadenopathy. She began treatment with FOLFIRI on 06/01/2011. Avastin was added beginning with cycle number 2. She completed cycle #6 08/08/2011. Restaging CT evaluation 08/15/2011 showed improvement in the liver metastases and porta hepatis lymphadenopathy. She completed cycle #12 FOLFIRI/Avastin 11/07/2011. Restaging CT evaluation 11/24/2011 showed decrease in size of hepatic metastases and no evidence of disease progression or new metastasis. She completed cycle 18 on 03/11/2012. A restaging CT 03/29/2012 revealed stable disease. Irinotecan was discontinued and treatment continued with 5-FU/leucovorin and Avastin on a 3 week schedule beginning 04/02/2012. Last chemotherapy given 04/23/2012. 3. Status post Port-A-Cath placement and Port-A-Cath removal by Dr. Michaell Cowing. A new Port-A-Cath was placed on 05/16/2011. The Port-A-Cath was removed due to malposition. A new Port-A-Cath was placed on 05/30/2011. 4. Spina bifida with severe scoliosis.  5. Chronic bilateral foot drop/lower leg and foot numbness. 6. Hypothyroidism. 7. History of chemotherapy-induced diarrhea. The bolus and infusional 5-FU were reduced by 25% beginning with cycle number 2 of FOLFOX. The 5-FU dose was decreased by an additional 10% beginning with cycle number 8. 8. History of delayed nausea following chemotherapy. 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 The oxaliplatin was dose reduced by 20% beginning with cycle number 6. The neuropathy symptoms resolved. 10. History of neutropenia secondary to chemotherapy. 11. Status post a colonoscopy 05/05/2010. There was a previous right colectomy anastomosis and slight nodularity. Biopsies were taken which showed benign colonic mucosa with hyperplastic epithelial changes. No significant inflammation, granuloma, or adenomatous epithelium was identified. 12. Diarrhea following FOLFIRI chemotherapy. Overall the diarrhea is controlled with Lomotil.  13. History of a  fungal rash in the groin, improved with Diflucan and nystatin powder. 14. Mouth ulcers, oral candidiasis following cycle #6 of FOLFIRI-the ulcers resolved following treatment with Diflucan.  Disposition-April Spence appears stable. She saw Dr. Donell Beers regarding a liver resection procedure. She has decided to continue systemic therapy at present. Treatment will be resumed with 5-FU/leucovorin and Avastin on a 3 week schedule beginning today. She will return for the next cycle in 3 weeks. She will return for a followup visit and treatment in 6 weeks. She will contact the office in the interim with any problems.  Plan reviewed with Dr. Truett Perna.  Lonna Cobb ANP/GNP-BC

## 2012-05-30 ENCOUNTER — Ambulatory Visit (HOSPITAL_BASED_OUTPATIENT_CLINIC_OR_DEPARTMENT_OTHER): Payer: Medicare HMO

## 2012-05-30 VITALS — BP 125/69 | HR 79 | Temp 98.1°F | Resp 18

## 2012-05-30 DIAGNOSIS — C787 Secondary malignant neoplasm of liver and intrahepatic bile duct: Secondary | ICD-10-CM

## 2012-05-30 DIAGNOSIS — C180 Malignant neoplasm of cecum: Secondary | ICD-10-CM

## 2012-05-30 DIAGNOSIS — Z452 Encounter for adjustment and management of vascular access device: Secondary | ICD-10-CM

## 2012-05-30 DIAGNOSIS — C189 Malignant neoplasm of colon, unspecified: Secondary | ICD-10-CM

## 2012-05-30 DIAGNOSIS — C772 Secondary and unspecified malignant neoplasm of intra-abdominal lymph nodes: Secondary | ICD-10-CM

## 2012-05-30 MED ORDER — HEPARIN SOD (PORK) LOCK FLUSH 100 UNIT/ML IV SOLN
500.0000 [IU] | Freq: Once | INTRAVENOUS | Status: AC
  Administered 2012-05-30: 500 [IU]
  Filled 2012-05-30: qty 5

## 2012-05-30 MED ORDER — SODIUM CHLORIDE 0.9 % IJ SOLN
10.0000 mL | INTRAMUSCULAR | Status: DC | PRN
  Administered 2012-05-30: 10 mL
  Filled 2012-05-30: qty 10

## 2012-05-30 NOTE — Patient Instructions (Signed)
Call MD for problems 

## 2012-06-14 ENCOUNTER — Telehealth: Payer: Self-pay | Admitting: *Deleted

## 2012-06-14 DIAGNOSIS — E039 Hypothyroidism, unspecified: Secondary | ICD-10-CM

## 2012-06-14 MED ORDER — LEVOTHYROXINE SODIUM 150 MCG PO TABS: 150.0000 ug | ORAL_TABLET | Freq: Every day | ORAL | Status: DC

## 2012-06-14 NOTE — Telephone Encounter (Signed)
PCP had to change her thyroid medication dose from 125 mcg to 150 mcg and patient is concerned it could have something to do with her cancer. Wanted Dr. Truett Perna aware.

## 2012-06-16 ENCOUNTER — Other Ambulatory Visit: Payer: Self-pay | Admitting: Oncology

## 2012-06-18 ENCOUNTER — Encounter: Payer: Self-pay | Admitting: *Deleted

## 2012-06-18 ENCOUNTER — Other Ambulatory Visit (HOSPITAL_BASED_OUTPATIENT_CLINIC_OR_DEPARTMENT_OTHER): Payer: Medicare HMO | Admitting: Lab

## 2012-06-18 ENCOUNTER — Ambulatory Visit (HOSPITAL_BASED_OUTPATIENT_CLINIC_OR_DEPARTMENT_OTHER): Payer: Medicare HMO

## 2012-06-18 ENCOUNTER — Other Ambulatory Visit: Payer: Self-pay | Admitting: Emergency Medicine

## 2012-06-18 VITALS — BP 105/70 | HR 71 | Temp 98.1°F | Resp 16

## 2012-06-18 DIAGNOSIS — C180 Malignant neoplasm of cecum: Secondary | ICD-10-CM

## 2012-06-18 DIAGNOSIS — C182 Malignant neoplasm of ascending colon: Secondary | ICD-10-CM

## 2012-06-18 DIAGNOSIS — C189 Malignant neoplasm of colon, unspecified: Secondary | ICD-10-CM

## 2012-06-18 DIAGNOSIS — C787 Secondary malignant neoplasm of liver and intrahepatic bile duct: Secondary | ICD-10-CM

## 2012-06-18 DIAGNOSIS — Z5111 Encounter for antineoplastic chemotherapy: Secondary | ICD-10-CM

## 2012-06-18 DIAGNOSIS — Z5112 Encounter for antineoplastic immunotherapy: Secondary | ICD-10-CM

## 2012-06-18 LAB — CBC WITH DIFFERENTIAL/PLATELET
BASO%: 0.5 % (ref 0.0–2.0)
Eosinophils Absolute: 0.1 10*3/uL (ref 0.0–0.5)
HCT: 39.3 % (ref 34.8–46.6)
LYMPH%: 29.7 % (ref 14.0–49.7)
MCHC: 32.6 g/dL (ref 31.5–36.0)
MCV: 96.8 fL (ref 79.5–101.0)
MONO%: 6.9 % (ref 0.0–14.0)
NEUT%: 60.8 % (ref 38.4–76.8)
Platelets: 239 10*3/uL (ref 145–400)
RBC: 4.06 10*6/uL (ref 3.70–5.45)

## 2012-06-18 LAB — UA PROTEIN, DIPSTICK - CHCC: Protein, ur: NEGATIVE mg/dL

## 2012-06-18 LAB — COMPREHENSIVE METABOLIC PANEL (CC13)
Alkaline Phosphatase: 70 U/L (ref 40–150)
CO2: 21 mEq/L — ABNORMAL LOW (ref 22–29)
Creatinine: 0.7 mg/dL (ref 0.6–1.1)
Glucose: 175 mg/dl — ABNORMAL HIGH (ref 70–99)
Sodium: 137 mEq/L (ref 136–145)
Total Bilirubin: 0.3 mg/dL (ref 0.20–1.20)
Total Protein: 7.5 g/dL (ref 6.4–8.3)

## 2012-06-18 MED ORDER — SODIUM CHLORIDE 0.9 % IV SOLN
Freq: Once | INTRAVENOUS | Status: AC
  Administered 2012-06-18: 10:00:00 via INTRAVENOUS

## 2012-06-18 MED ORDER — SODIUM CHLORIDE 0.9 % IV SOLN
5.0000 mg/kg | Freq: Once | INTRAVENOUS | Status: AC
  Administered 2012-06-18: 325 mg via INTRAVENOUS
  Filled 2012-06-18: qty 13

## 2012-06-18 MED ORDER — LEUCOVORIN CALCIUM INJECTION 350 MG
375.0000 mg/m2 | Freq: Once | INTRAVENOUS | Status: AC
  Administered 2012-06-18: 660 mg via INTRAVENOUS
  Filled 2012-06-18: qty 33

## 2012-06-18 MED ORDER — SODIUM CHLORIDE 0.9 % IV SOLN
1475.0000 mg/m2 | INTRAVENOUS | Status: DC
  Administered 2012-06-18: 2600 mg via INTRAVENOUS
  Filled 2012-06-18: qty 52

## 2012-06-18 MED ORDER — ONDANSETRON 8 MG/50ML IVPB (CHCC)
8.0000 mg | Freq: Once | INTRAVENOUS | Status: AC
  Administered 2012-06-18: 8 mg via INTRAVENOUS

## 2012-06-18 MED ORDER — FLUOROURACIL CHEMO INJECTION 500 MG/10ML
250.0000 mg/m2 | Freq: Once | INTRAVENOUS | Status: AC
  Administered 2012-06-18: 450 mg via INTRAVENOUS
  Filled 2012-06-18: qty 9

## 2012-06-18 NOTE — Patient Instructions (Signed)
[facility] Discharge Instructions for Patients Receiving Chemotherapy  Today you received the following chemotherapy agents Leucovorin, 5FU and Avastin.  To help prevent nausea and vomiting after your treatment, we encourage you to take your nausea medication.   If you develop nausea and vomiting that is not controlled by your nausea medication, call the clinic. If it is after clinic hours your family physician or the after hours number for the clinic or go to the Emergency Department.   BELOW ARE SYMPTOMS THAT SHOULD BE REPORTED IMMEDIATELY:  *FEVER GREATER THAN 100.5 F  *CHILLS WITH OR WITHOUT FEVER  NAUSEA AND VOMITING THAT IS NOT CONTROLLED WITH YOUR NAUSEA MEDICATION  *UNUSUAL SHORTNESS OF BREATH  *UNUSUAL BRUISING OR BLEEDING  TENDERNESS IN MOUTH AND THROAT WITH OR WITHOUT PRESENCE OF ULCERS  *URINARY PROBLEMS  *BOWEL PROBLEMS  UNUSUAL RASH Items with * indicate a potential emergency and should be followed up as soon as possible.  One of the nurses will contact you 24 hours after your treatment. Please let the nurse know about any problems that you may have experienced. Feel free to call the clinic you have any questions or concerns. The clinic phone number is [phone].   I have been informed and understand all the instructions given to me. I know to contact the clinic, my physician, or go to the Emergency Department if any problems should occur. I do not have any questions at this time, but understand that I may call the clinic during office hours   should I have any questions or need assistance in obtaining follow up care.    __________________________________________  _____________  __________ Signature of Patient or Authorized Representative            Date                   Time    __________________________________________ Nurse's Signature

## 2012-06-20 ENCOUNTER — Ambulatory Visit (HOSPITAL_BASED_OUTPATIENT_CLINIC_OR_DEPARTMENT_OTHER): Payer: Medicare HMO

## 2012-06-20 VITALS — BP 131/81 | HR 80 | Temp 98.6°F

## 2012-06-20 DIAGNOSIS — C787 Secondary malignant neoplasm of liver and intrahepatic bile duct: Secondary | ICD-10-CM

## 2012-06-20 DIAGNOSIS — C189 Malignant neoplasm of colon, unspecified: Secondary | ICD-10-CM

## 2012-06-20 DIAGNOSIS — C180 Malignant neoplasm of cecum: Secondary | ICD-10-CM

## 2012-06-20 DIAGNOSIS — Z452 Encounter for adjustment and management of vascular access device: Secondary | ICD-10-CM

## 2012-06-20 MED ORDER — SODIUM CHLORIDE 0.9 % IJ SOLN
10.0000 mL | INTRAMUSCULAR | Status: DC | PRN
  Administered 2012-06-20: 10 mL
  Filled 2012-06-20: qty 10

## 2012-06-20 MED ORDER — HEPARIN SOD (PORK) LOCK FLUSH 100 UNIT/ML IV SOLN
500.0000 [IU] | Freq: Once | INTRAVENOUS | Status: AC
  Administered 2012-06-20: 500 [IU]
  Filled 2012-06-20: qty 5

## 2012-06-20 NOTE — Patient Instructions (Signed)
Call MD with any questions 

## 2012-06-20 NOTE — Progress Notes (Signed)
1046-Flushed port with multiple saline flushes.  No blood return noted.  Pt states that cathflo was used on port at port access on 06-18-12.  Pt denies complaints, no pain at port site.  Port flushed again with saline, heparin and deaccessed-dhp, rn

## 2012-06-25 ENCOUNTER — Other Ambulatory Visit: Payer: Self-pay | Admitting: *Deleted

## 2012-06-25 DIAGNOSIS — C189 Malignant neoplasm of colon, unspecified: Secondary | ICD-10-CM

## 2012-06-25 MED ORDER — DIPHENOXYLATE-ATROPINE 2.5-0.025 MG PO TABS: ORAL_TABLET | ORAL | Status: DC

## 2012-07-07 ENCOUNTER — Other Ambulatory Visit: Payer: Self-pay | Admitting: Oncology

## 2012-07-09 ENCOUNTER — Ambulatory Visit (HOSPITAL_BASED_OUTPATIENT_CLINIC_OR_DEPARTMENT_OTHER): Payer: Medicare HMO

## 2012-07-09 ENCOUNTER — Ambulatory Visit (HOSPITAL_BASED_OUTPATIENT_CLINIC_OR_DEPARTMENT_OTHER): Payer: Medicare HMO | Admitting: Oncology

## 2012-07-09 ENCOUNTER — Other Ambulatory Visit (HOSPITAL_BASED_OUTPATIENT_CLINIC_OR_DEPARTMENT_OTHER): Payer: Medicare HMO | Admitting: Lab

## 2012-07-09 ENCOUNTER — Telehealth: Payer: Self-pay | Admitting: *Deleted

## 2012-07-09 VITALS — BP 112/72 | HR 70 | Temp 97.0°F | Resp 20 | Ht 60.0 in | Wt 140.2 lb

## 2012-07-09 VITALS — BP 128/69 | HR 66

## 2012-07-09 DIAGNOSIS — C772 Secondary and unspecified malignant neoplasm of intra-abdominal lymph nodes: Secondary | ICD-10-CM

## 2012-07-09 DIAGNOSIS — C787 Secondary malignant neoplasm of liver and intrahepatic bile duct: Secondary | ICD-10-CM

## 2012-07-09 DIAGNOSIS — C180 Malignant neoplasm of cecum: Secondary | ICD-10-CM

## 2012-07-09 DIAGNOSIS — C189 Malignant neoplasm of colon, unspecified: Secondary | ICD-10-CM

## 2012-07-09 DIAGNOSIS — Z5112 Encounter for antineoplastic immunotherapy: Secondary | ICD-10-CM

## 2012-07-09 DIAGNOSIS — Z5111 Encounter for antineoplastic chemotherapy: Secondary | ICD-10-CM

## 2012-07-09 LAB — CBC WITH DIFFERENTIAL/PLATELET
Basophils Absolute: 0 10*3/uL (ref 0.0–0.1)
Eosinophils Absolute: 0.2 10*3/uL (ref 0.0–0.5)
HCT: 40.1 % (ref 34.8–46.6)
HGB: 13.5 g/dL (ref 11.6–15.9)
LYMPH%: 36.1 % (ref 14.0–49.7)
MCHC: 33.6 g/dL (ref 31.5–36.0)
MONO#: 0.5 10*3/uL (ref 0.1–0.9)
NEUT%: 50.8 % (ref 38.4–76.8)
Platelets: 219 10*3/uL (ref 145–400)
WBC: 5.4 10*3/uL (ref 3.9–10.3)
lymph#: 2 10*3/uL (ref 0.9–3.3)

## 2012-07-09 LAB — UA PROTEIN, DIPSTICK - CHCC: Protein, ur: NEGATIVE mg/dL

## 2012-07-09 LAB — COMPREHENSIVE METABOLIC PANEL (CC13)
ALT: 38 U/L (ref 0–55)
BUN: 13 mg/dL (ref 7.0–26.0)
CO2: 24 mEq/L (ref 22–29)
Calcium: 10 mg/dL (ref 8.4–10.4)
Chloride: 106 mEq/L (ref 98–107)
Creatinine: 0.7 mg/dL (ref 0.6–1.1)

## 2012-07-09 MED ORDER — LEUCOVORIN CALCIUM INJECTION 350 MG
375.0000 mg/m2 | Freq: Once | INTRAVENOUS | Status: AC
  Administered 2012-07-09: 660 mg via INTRAVENOUS
  Filled 2012-07-09: qty 33

## 2012-07-09 MED ORDER — SODIUM CHLORIDE 0.9 % IV SOLN
Freq: Once | INTRAVENOUS | Status: AC
  Administered 2012-07-09: 11:00:00 via INTRAVENOUS

## 2012-07-09 MED ORDER — SODIUM CHLORIDE 0.9 % IV SOLN
1475.0000 mg/m2 | INTRAVENOUS | Status: DC
  Administered 2012-07-09: 2600 mg via INTRAVENOUS
  Filled 2012-07-09: qty 52

## 2012-07-09 MED ORDER — ONDANSETRON 8 MG/50ML IVPB (CHCC)
8.0000 mg | Freq: Once | INTRAVENOUS | Status: AC
  Administered 2012-07-09: 8 mg via INTRAVENOUS

## 2012-07-09 MED ORDER — FLUOROURACIL CHEMO INJECTION 500 MG/10ML
250.0000 mg/m2 | Freq: Once | INTRAVENOUS | Status: AC
  Administered 2012-07-09: 450 mg via INTRAVENOUS
  Filled 2012-07-09: qty 9

## 2012-07-09 MED ORDER — SODIUM CHLORIDE 0.9 % IV SOLN
5.0000 mg/kg | Freq: Once | INTRAVENOUS | Status: AC
  Administered 2012-07-09: 325 mg via INTRAVENOUS
  Filled 2012-07-09: qty 13

## 2012-07-09 NOTE — Patient Instructions (Addendum)
[facility] Discharge Instructions for Patients Receiving Chemotherapy  Today you received the following chemotherapy agents Avastin, Leucovorin an 5-FU.  To help prevent nausea and vomiting after your treatment, we encourage you to take your nausea medication as ordered per MD.    If you develop nausea and vomiting that is not controlled by your nausea medication, call the clinic. If it is after clinic hours your family physician or the after hours number for the clinic or go to the Emergency Department.   BELOW ARE SYMPTOMS THAT SHOULD BE REPORTED IMMEDIATELY:  *FEVER GREATER THAN 100.5 F  *CHILLS WITH OR WITHOUT FEVER  NAUSEA AND VOMITING THAT IS NOT CONTROLLED WITH YOUR NAUSEA MEDICATION  *UNUSUAL SHORTNESS OF BREATH  *UNUSUAL BRUISING OR BLEEDING  TENDERNESS IN MOUTH AND THROAT WITH OR WITHOUT PRESENCE OF ULCERS  *URINARY PROBLEMS  *BOWEL PROBLEMS  UNUSUAL RASH Items with * indicate a potential emergency and should be followed up as soon as possible.   Please let the nurse know about any problems that you may have experienced. Feel free to call the clinic you have any questions or concerns. The clinic phone number is [phone].   I have been informed and understand all the instructions given to me. I know to contact the clinic, my physician, or go to the Emergency Department if any problems should occur. I do not have any questions at this time, but understand that I may call the clinic during office hours   should I have any questions or need assistance in obtaining follow up care.    Return for pump d/c on 07/11/12 at 11:00AM.

## 2012-07-09 NOTE — Progress Notes (Signed)
[facility]    OFFICE PROGRESS NOTE   INTERVAL HISTORY:   She returns as scheduled. She completed another cycle of chemotherapy on 06/18/2012. She reports nausea near the end of treatment. No diarrhea. Occasional pain in the right abdomen.  Objective:  Vital signs in last 24 hours:  Blood pressure 112/72, pulse 70, temperature 97 F (36.1 C), temperature source Oral, resp. rate 20, height 5' (1.524 m), weight 140 lb 3.2 oz (63.594 kg).    HEENT: No thrush or ulcer Resp: Inspiratory rhonchi at the lower posterior chest bilaterally, no respiratory distress Cardio: Regular rate and rhythm GI: Nontender, no hepatomegaly Vascular: No leg edema  Skin: Mild hyperpigmentation at the palms, no erythema   Portacath/PICC-without erythema  Lab Results:  Lab Results  Component Value Date   WBC 5.4 07/09/2012   HGB 13.5 07/09/2012   HCT 40.1 07/09/2012   MCV 98.4 07/09/2012   PLT 219 07/09/2012   ANC 2.8 CEA on 05/28/2012-10.6  Medications: I have reviewed the patient's current medications.  Assessment/Plan: 1. 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 She completed 12 cycles of FOLFOX chemotherapy. Oxaliplatin was held with cycles number 5, 8, 11, and 12. 2. Metastatic colon cancer confirmed on a restaging CT evaluation 05/02/2011 with liver metastases and periportal lymphadenopathy. She began treatment with FOLFIRI on 06/01/2011. Avastin was added beginning with cycle number 2. She completed cycle #6 08/08/2011. Restaging CT evaluation 08/15/2011 showed improvement in the liver metastases and porta hepatis lymphadenopathy. She completed cycle #12 FOLFIRI/Avastin 11/07/2011. Restaging CT evaluation 11/24/2011 showed decrease in size of hepatic metastases and no evidence of disease progression or new metastasis. She completed cycle 18 on 03/11/2012.  A restaging CT 03/29/2012 revealed stable disease. Irinotecan was discontinued and treatment continued with 5-FU/leucovorin and Avastin on a 3 week schedule beginning 04/02/2012. 3. Status post Port-A-Cath placement and Port-A-Cath removal by Dr. Michaell Cowing. A new Port-A-Cath was placed on 05/16/2011. The Port-A-Cath was removed due to malposition. A new Port-A-Cath was placed on 05/30/2011. 4. Spina bifida with severe scoliosis.  5. Chronic bilateral foot drop/lower leg and foot numbness. 6. Hypothyroidism. 7. History of chemotherapy-induced diarrhea. The bolus and infusional 5-FU were reduced by 25% beginning with cycle number 2 of FOLFOX. The 5-FU dose was decreased by an additional 10% beginning with cycle number 8. 8. History of delayed nausea following chemotherapy. 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 The oxaliplatin was dose reduced by 20% beginning with cycle number 6. The neuropathy symptoms resolved. 10. History of neutropenia secondary to chemotherapy. 11. Status post a colonoscopy 05/05/2010. There was a previous right colectomy anastomosis and slight nodularity. Biopsies were taken which showed benign colonic mucosa with hyperplastic epithelial changes. No significant inflammation, granuloma, or adenomatous epithelium was identified. 12. Diarrhea following FOLFIRI chemotherapy. Overall the diarrhea is controlled with Lomotil.  13. History of a fungal rash in the groin, improved with Diflucan and nystatin powder. 14. Mouth ulcers, oral candidiasis following cycle #6 of FOLFIRI-the ulcers resolved following treatment with Diflucan. 15. Nausea during chemotherapy-Zofran will be added to the chemotherapy regimen beginning today.   Disposition:  She appears stable. The plan is to continue 5-fluorouracil/Avastin on a 3 week schedule. She will return for chemotherapy on 07/30/2012 and 08/20/2012. She will be scheduled for a restaging CT evaluation prior to the office  visit on 08/20/2012. The CEA  was slightly higher last month. A CEA will be repeated today.   Thornton Papas, MD  07/09/2012  9:54 AM

## 2012-07-09 NOTE — Telephone Encounter (Signed)
appts made and printed for pt aom °

## 2012-07-09 NOTE — Telephone Encounter (Signed)
Per staff phone message and POF I have scheduled appts,. JMW

## 2012-07-11 ENCOUNTER — Ambulatory Visit (HOSPITAL_BASED_OUTPATIENT_CLINIC_OR_DEPARTMENT_OTHER): Payer: Medicare HMO

## 2012-07-11 VITALS — BP 121/76 | HR 79 | Temp 98.1°F

## 2012-07-11 DIAGNOSIS — C189 Malignant neoplasm of colon, unspecified: Secondary | ICD-10-CM

## 2012-07-11 MED ORDER — SODIUM CHLORIDE 0.9 % IJ SOLN
10.0000 mL | INTRAMUSCULAR | Status: DC | PRN
  Administered 2012-07-11: 10 mL
  Filled 2012-07-11: qty 10

## 2012-07-11 MED ORDER — HEPARIN SOD (PORK) LOCK FLUSH 100 UNIT/ML IV SOLN
500.0000 [IU] | Freq: Once | INTRAVENOUS | Status: AC
  Administered 2012-07-11: 500 [IU]
  Filled 2012-07-11: qty 5

## 2012-07-11 NOTE — Patient Instructions (Signed)
Call MD for problems 

## 2012-07-11 NOTE — Progress Notes (Signed)
Note:  Unable to get blood return with pump dc.  Patient states the nurse had to lay her down to get blood return when accessed.

## 2012-07-28 ENCOUNTER — Other Ambulatory Visit: Payer: Self-pay | Admitting: Oncology

## 2012-07-30 ENCOUNTER — Ambulatory Visit (HOSPITAL_BASED_OUTPATIENT_CLINIC_OR_DEPARTMENT_OTHER): Payer: Medicare HMO

## 2012-07-30 ENCOUNTER — Other Ambulatory Visit (HOSPITAL_BASED_OUTPATIENT_CLINIC_OR_DEPARTMENT_OTHER): Payer: Medicare HMO | Admitting: Lab

## 2012-07-30 VITALS — BP 125/76 | HR 70 | Temp 98.3°F

## 2012-07-30 DIAGNOSIS — Z5111 Encounter for antineoplastic chemotherapy: Secondary | ICD-10-CM

## 2012-07-30 DIAGNOSIS — Z452 Encounter for adjustment and management of vascular access device: Secondary | ICD-10-CM

## 2012-07-30 DIAGNOSIS — C189 Malignant neoplasm of colon, unspecified: Secondary | ICD-10-CM

## 2012-07-30 DIAGNOSIS — C180 Malignant neoplasm of cecum: Secondary | ICD-10-CM

## 2012-07-30 DIAGNOSIS — Z5112 Encounter for antineoplastic immunotherapy: Secondary | ICD-10-CM

## 2012-07-30 LAB — CBC WITH DIFFERENTIAL/PLATELET
BASO%: 0.5 % (ref 0.0–2.0)
Basophils Absolute: 0 10*3/uL (ref 0.0–0.1)
EOS%: 2.1 % (ref 0.0–7.0)
HCT: 40 % (ref 34.8–46.6)
HGB: 13.4 g/dL (ref 11.6–15.9)
MCH: 32.7 pg (ref 25.1–34.0)
MCHC: 33.4 g/dL (ref 31.5–36.0)
MCV: 97.9 fL (ref 79.5–101.0)
MONO%: 10.9 % (ref 0.0–14.0)
NEUT%: 61.6 % (ref 38.4–76.8)

## 2012-07-30 MED ORDER — LEUCOVORIN CALCIUM INJECTION 350 MG
375.0000 mg/m2 | Freq: Once | INTRAVENOUS | Status: AC
  Administered 2012-07-30: 660 mg via INTRAVENOUS
  Filled 2012-07-30: qty 33

## 2012-07-30 MED ORDER — ONDANSETRON 8 MG/50ML IVPB (CHCC): 8.0000 mg | Freq: Once | INTRAVENOUS | Status: DC

## 2012-07-30 MED ORDER — SODIUM CHLORIDE 0.9 % IV SOLN
Freq: Once | INTRAVENOUS | Status: AC
  Administered 2012-07-30: 12:00:00 via INTRAVENOUS

## 2012-07-30 MED ORDER — SODIUM CHLORIDE 0.9 % IJ SOLN
10.0000 mL | INTRAMUSCULAR | Status: DC | PRN
  Filled 2012-07-30: qty 10

## 2012-07-30 MED ORDER — ALTEPLASE 2 MG IJ SOLR
2.0000 mg | Freq: Once | INTRAMUSCULAR | Status: AC | PRN
  Administered 2012-07-30: 2 mg
  Filled 2012-07-30: qty 2

## 2012-07-30 MED ORDER — FLUOROURACIL CHEMO INJECTION 500 MG/10ML
250.0000 mg/m2 | Freq: Once | INTRAVENOUS | Status: AC
  Administered 2012-07-30: 450 mg via INTRAVENOUS
  Filled 2012-07-30: qty 9

## 2012-07-30 MED ORDER — SODIUM CHLORIDE 0.9 % IV SOLN: Freq: Once | INTRAVENOUS | Status: DC

## 2012-07-30 MED ORDER — SODIUM CHLORIDE 0.9 % IV SOLN
5.0000 mg/kg | Freq: Once | INTRAVENOUS | Status: AC
  Administered 2012-07-30: 325 mg via INTRAVENOUS
  Filled 2012-07-30: qty 13

## 2012-07-30 MED ORDER — SODIUM CHLORIDE 0.9 % IV SOLN
1475.0000 mg/m2 | INTRAVENOUS | Status: DC
  Administered 2012-07-30: 2600 mg via INTRAVENOUS
  Filled 2012-07-30: qty 52

## 2012-07-30 MED ORDER — ONDANSETRON 8 MG/50ML IVPB (CHCC)
8.0000 mg | Freq: Once | INTRAVENOUS | Status: AC
  Administered 2012-07-30: 8 mg via INTRAVENOUS

## 2012-07-30 NOTE — Patient Instructions (Addendum)
[facility] Discharge Instructions for Patients Receiving Chemotherapy  Today you received the following chemotherapy agents: avastin, leucovorin, 59fu  To help prevent nausea and vomiting after your treatment, we encourage you to take your nausea medication.  Take it as often as prescribed.     If you develop nausea and vomiting that is not controlled by your nausea medication, call the clinic. If it is after clinic hours your family physician or the after hours number for the clinic or go to the Emergency Department.   BELOW ARE SYMPTOMS THAT SHOULD BE REPORTED IMMEDIATELY:  *FEVER GREATER THAN 100.5 F  *CHILLS WITH OR WITHOUT FEVER  NAUSEA AND VOMITING THAT IS NOT CONTROLLED WITH YOUR NAUSEA MEDICATION  *UNUSUAL SHORTNESS OF BREATH  *UNUSUAL BRUISING OR BLEEDING  TENDERNESS IN MOUTH AND THROAT WITH OR WITHOUT PRESENCE OF ULCERS  *URINARY PROBLEMS  *BOWEL PROBLEMS  UNUSUAL RASH Items with * indicate a potential emergency and should be followed up as soon as possible.  Feel free to call the clinic you have any questions or concerns. The clinic phone number is [phone].    I have been informed and understand all the instructions given to me. I know to contact the clinic, my physician, or go to the Emergency Department if any problems should occur. I do not have any questions at this time, but understand that I may call the clinic during office hours   should I have any questions or need assistance in obtaining follow up care.    __________________________________________  _____________  __________ Signature of Patient or Authorized Representative            Date                   Time    __________________________________________ Nurse's Signature

## 2012-07-30 NOTE — Progress Notes (Signed)
Avastin and Leucovorin completed.  Unable to obtain blood return for 5FU push.  TPA instailled and blood return obtained at 1600.  5FU push instilled 1600-1605.  5FU pump connected 1610.

## 2012-08-01 ENCOUNTER — Ambulatory Visit (HOSPITAL_BASED_OUTPATIENT_CLINIC_OR_DEPARTMENT_OTHER): Payer: Medicare HMO

## 2012-08-01 VITALS — BP 118/93 | HR 75 | Temp 98.3°F

## 2012-08-01 DIAGNOSIS — C180 Malignant neoplasm of cecum: Secondary | ICD-10-CM

## 2012-08-01 DIAGNOSIS — C189 Malignant neoplasm of colon, unspecified: Secondary | ICD-10-CM

## 2012-08-01 MED ORDER — SODIUM CHLORIDE 0.9 % IJ SOLN
10.0000 mL | INTRAMUSCULAR | Status: DC | PRN
  Administered 2012-08-01: 10 mL
  Filled 2012-08-01: qty 10

## 2012-08-01 MED ORDER — HEPARIN SOD (PORK) LOCK FLUSH 100 UNIT/ML IV SOLN
500.0000 [IU] | Freq: Once | INTRAVENOUS | Status: AC
  Administered 2012-08-01: 500 [IU]
  Filled 2012-08-01: qty 5

## 2012-08-15 ENCOUNTER — Ambulatory Visit (HOSPITAL_COMMUNITY)
Admission: RE | Admit: 2012-08-15 | Discharge: 2012-08-15 | Disposition: A | Discharge status: Home / Self Care | Payer: Medicare HMO | Source: Ambulatory Visit | Attending: Oncology | Admitting: Oncology

## 2012-08-15 DIAGNOSIS — C787 Secondary malignant neoplasm of liver and intrahepatic bile duct: Secondary | ICD-10-CM

## 2012-08-15 DIAGNOSIS — C189 Malignant neoplasm of colon, unspecified: Secondary | ICD-10-CM | POA: Insufficient documentation

## 2012-08-15 HISTORY — DX: Secondary malignant neoplasm of liver and intrahepatic bile duct: C78.7

## 2012-08-15 MED ORDER — IOHEXOL 300 MG/ML  SOLN
100.0000 mL | Freq: Once | INTRAMUSCULAR | Status: AC | PRN
  Administered 2012-08-15: 100 mL via INTRAVENOUS

## 2012-08-16 ENCOUNTER — Other Ambulatory Visit (HOSPITAL_COMMUNITY): Payer: Medicare HMO

## 2012-08-18 ENCOUNTER — Other Ambulatory Visit: Payer: Self-pay | Admitting: Oncology

## 2012-08-20 ENCOUNTER — Other Ambulatory Visit (HOSPITAL_BASED_OUTPATIENT_CLINIC_OR_DEPARTMENT_OTHER): Payer: Medicare HMO | Admitting: Lab

## 2012-08-20 ENCOUNTER — Telehealth: Payer: Self-pay | Admitting: Oncology

## 2012-08-20 ENCOUNTER — Ambulatory Visit (HOSPITAL_BASED_OUTPATIENT_CLINIC_OR_DEPARTMENT_OTHER): Payer: Medicare HMO | Admitting: Oncology

## 2012-08-20 ENCOUNTER — Telehealth: Payer: Self-pay | Admitting: *Deleted

## 2012-08-20 ENCOUNTER — Ambulatory Visit (HOSPITAL_BASED_OUTPATIENT_CLINIC_OR_DEPARTMENT_OTHER): Payer: Medicare HMO

## 2012-08-20 ENCOUNTER — Other Ambulatory Visit: Payer: Self-pay | Admitting: Oncology

## 2012-08-20 VITALS — BP 116/72 | HR 72 | Temp 98.8°F

## 2012-08-20 DIAGNOSIS — C189 Malignant neoplasm of colon, unspecified: Secondary | ICD-10-CM

## 2012-08-20 DIAGNOSIS — R110 Nausea: Secondary | ICD-10-CM

## 2012-08-20 DIAGNOSIS — C772 Secondary and unspecified malignant neoplasm of intra-abdominal lymph nodes: Secondary | ICD-10-CM

## 2012-08-20 DIAGNOSIS — Z5112 Encounter for antineoplastic immunotherapy: Secondary | ICD-10-CM

## 2012-08-20 DIAGNOSIS — C180 Malignant neoplasm of cecum: Secondary | ICD-10-CM

## 2012-08-20 DIAGNOSIS — C787 Secondary malignant neoplasm of liver and intrahepatic bile duct: Secondary | ICD-10-CM

## 2012-08-20 DIAGNOSIS — Z5111 Encounter for antineoplastic chemotherapy: Secondary | ICD-10-CM

## 2012-08-20 LAB — CBC WITH DIFFERENTIAL/PLATELET
BASO%: 0.2 % (ref 0.0–2.0)
Basophils Absolute: 0 10*3/uL (ref 0.0–0.1)
EOS%: 2.1 % (ref 0.0–7.0)
HCT: 41.6 % (ref 34.8–46.6)
HGB: 14 g/dL (ref 11.6–15.9)
LYMPH%: 21.4 % (ref 14.0–49.7)
MCH: 33.1 pg (ref 25.1–34.0)
MCHC: 33.8 g/dL (ref 31.5–36.0)
MCV: 97.9 fL (ref 79.5–101.0)
MONO%: 10.2 % (ref 0.0–14.0)
NEUT%: 66.1 % (ref 38.4–76.8)
lymph#: 1.4 10*3/uL (ref 0.9–3.3)

## 2012-08-20 LAB — COMPREHENSIVE METABOLIC PANEL (CC13)
AST: 33 U/L (ref 5–34)
Albumin: 3.6 g/dL (ref 3.5–5.0)
Alkaline Phosphatase: 86 U/L (ref 40–150)
BUN: 15 mg/dL (ref 7.0–26.0)
Creatinine: 0.7 mg/dL (ref 0.6–1.1)
Glucose: 103 mg/dl — ABNORMAL HIGH (ref 70–99)
Total Bilirubin: 0.43 mg/dL (ref 0.20–1.20)

## 2012-08-20 MED ORDER — SODIUM CHLORIDE 0.9 % IV SOLN
5.0000 mg/kg | Freq: Once | INTRAVENOUS | Status: AC
  Administered 2012-08-20: 325 mg via INTRAVENOUS
  Filled 2012-08-20: qty 13

## 2012-08-20 MED ORDER — SODIUM CHLORIDE 0.9 % IJ SOLN
10.0000 mL | INTRAMUSCULAR | Status: DC | PRN
  Filled 2012-08-20: qty 10

## 2012-08-20 MED ORDER — ONDANSETRON 8 MG/50ML IVPB (CHCC)
8.0000 mg | Freq: Once | INTRAVENOUS | Status: AC
  Administered 2012-08-20: 8 mg via INTRAVENOUS

## 2012-08-20 MED ORDER — LEUCOVORIN CALCIUM INJECTION 350 MG
375.0000 mg/m2 | Freq: Once | INTRAVENOUS | Status: AC
  Administered 2012-08-20: 660 mg via INTRAVENOUS
  Filled 2012-08-20: qty 33

## 2012-08-20 MED ORDER — SODIUM CHLORIDE 0.9 % IV SOLN
Freq: Once | INTRAVENOUS | Status: AC
  Administered 2012-08-20: 11:00:00 via INTRAVENOUS

## 2012-08-20 MED ORDER — FLUOROURACIL CHEMO INJECTION 500 MG/10ML
250.0000 mg/m2 | Freq: Once | INTRAVENOUS | Status: AC
  Administered 2012-08-20: 450 mg via INTRAVENOUS
  Filled 2012-08-20: qty 9

## 2012-08-20 MED ORDER — HEPARIN SOD (PORK) LOCK FLUSH 100 UNIT/ML IV SOLN
500.0000 [IU] | Freq: Once | INTRAVENOUS | Status: DC
  Filled 2012-08-20: qty 5

## 2012-08-20 MED ORDER — SODIUM CHLORIDE 0.9 % IV SOLN
1475.0000 mg/m2 | INTRAVENOUS | Status: DC
  Administered 2012-08-20: 2600 mg via INTRAVENOUS
  Filled 2012-08-20: qty 52

## 2012-08-20 NOTE — Progress Notes (Signed)
Urine Protein negative.  TKF

## 2012-08-20 NOTE — Telephone Encounter (Signed)
Gave pt appt for December 2013 lab, chemo and d/c pump. Also gave them January 2014 lab. ML and chemo

## 2012-08-20 NOTE — Telephone Encounter (Signed)
Per staff phone call and POF I have scheduled appts. JMW  

## 2012-08-20 NOTE — Patient Instructions (Signed)
RETURN FOR PUMP DISCONNECT:       [facility] Discharge Instructions for Patients Receiving Chemotherapy  Today you received the following chemotherapy agents: leucovorin, 5FU, 5FU pump.  To help prevent nausea and vomiting after your treatment, we encourage you to take your nausea medication.  Take it as often as prescribed.     If you develop nausea and vomiting that is not controlled by your nausea medication, call the clinic. If it is after clinic hours your family physician or the after hours number for the clinic or go to the Emergency Department.   BELOW ARE SYMPTOMS THAT SHOULD BE REPORTED IMMEDIATELY:  *FEVER GREATER THAN 100.5 F  *CHILLS WITH OR WITHOUT FEVER  NAUSEA AND VOMITING THAT IS NOT CONTROLLED WITH YOUR NAUSEA MEDICATION  *UNUSUAL SHORTNESS OF BREATH  *UNUSUAL BRUISING OR BLEEDING  TENDERNESS IN MOUTH AND THROAT WITH OR WITHOUT PRESENCE OF ULCERS  *URINARY PROBLEMS  *BOWEL PROBLEMS  UNUSUAL RASH Items with * indicate a potential emergency and should be followed up as soon as possible.  Feel free to call the clinic you have any questions or concerns. The clinic phone number is [phone].   I have been informed and understand all the instructions given to me. I know to contact the clinic, my physician, or go to the Emergency Department if any problems should occur. I do not have any questions at this time, but understand that I may call the clinic during office hours   should I have any questions or need assistance in obtaining follow up care.    __________________________________________  _____________  __________ Signature of Patient or Authorized Representative            Date                   Time    __________________________________________ Nurse's Signature

## 2012-08-20 NOTE — Progress Notes (Signed)
[facility]    OFFICE PROGRESS NOTE   INTERVAL HISTORY:   She returns as scheduled. She was last here with 5-FU and Avastin on 07/30/2012. She reports good control the diarrhea with Lomotil. Mild mouth sores following chemotherapy. Occasional mild nose bleed. No other complaint.  Objective:  Vital signs in last 24 hours:  There were no vitals taken for this visit.    HEENT: No thrush or ulcers Resp: Inspiratory rhonchi at the posterior base bilaterally, no respiratory distress Cardio: Regular rate and rhythm GI: Nontender, no hepatomegaly Vascular: No leg edema   Portacath/PICC-without erythema  Lab Results:  Lab Results  Component Value Date   WBC 6.7 08/20/2012   HGB 14.0 08/20/2012   HCT 41.6 08/20/2012   MCV 97.9 08/20/2012   PLT 220 08/20/2012   ANC 4.5  X-ray: CT abdomen on 08/15/2012-compared to 03/29/2012. The wedge-shaped area of hyper attenuation peripheral to the right hepatic lesion has enlarged consistent with a perfusion anomaly. Intrahepatic biliary dilatation peripheral to the central lesion is slightly worse. The underlying central lesion is slightly more conspicuous and partially calcified. It is difficult to measure secondary to the progressive density change in the adjacent liver and altered appearance of this lesion. No other liver lesions. Small nodes in the porta hepatis are unchanged.   Medications: I have reviewed the patient's current medications.  Assessment/Plan: 1. 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 She completed 12 cycles of FOLFOX chemotherapy. Oxaliplatin was held with cycles number 5, 8, 11, and 12. 2. Metastatic colon cancer confirmed on a restaging CT evaluation 05/02/2011 with liver metastases and periportal lymphadenopathy. She began treatment with FOLFIRI on 06/01/2011. Avastin was added  beginning with cycle number 2. She completed cycle #6 08/08/2011. Restaging CT evaluation 08/15/2011 showed improvement in the liver metastases and porta hepatis lymphadenopathy. She completed cycle #12 FOLFIRI/Avastin 11/07/2011. Restaging CT evaluation 11/24/2011 showed decrease in size of hepatic metastases and no evidence of disease progression or new metastasis. She completed cycle 18 on 03/11/2012. A restaging CT 03/29/2012 revealed stable disease. Irinotecan was discontinued and treatment continued with 5-FU/leucovorin and Avastin on a 3 week schedule beginning 04/02/2012.                                                                          -restaging CT 08/15/2012 with slight enlargement of the central liver lesion and progression of intrahepatic biliary dilatation, no other liver lesions and no other evidence of progressive metastatic disease 3. Status post Port-A-Cath placement and Port-A-Cath removal by Dr. Michaell Cowing. A new Port-A-Cath was placed on 05/16/2011. The Port-A-Cath was removed due to malposition. A new Port-A-Cath was placed on 05/30/2011. 4. Spina bifida with severe scoliosis.  5. Chronic bilateral foot drop/lower leg and foot numbness. 6. Hypothyroidism. 7. History of chemotherapy-induced diarrhea. The bolus and infusional 5-FU were reduced by 25% beginning with cycle number 2 of FOLFOX. The 5-FU dose was decreased by an additional 10% beginning with cycle number 8. 8. History of delayed nausea following chemotherapy. 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 The oxaliplatin was dose  reduced by 20% beginning with cycle number 6. The neuropathy symptoms resolved. 10. History of neutropenia secondary to chemotherapy. 11. Status post a colonoscopy 05/05/2010. There was a previous right colectomy anastomosis and slight nodularity. Biopsies were taken which showed benign colonic mucosa with hyperplastic epithelial changes. No significant inflammation, granuloma, or  adenomatous epithelium was identified. 12. Diarrhea following FOLFIRI chemotherapy. Overall the diarrhea is controlled with Lomotil.  13. History of a fungal rash in the groin, improved with Diflucan and nystatin powder. 14. Mouth ulcers, oral candidiasis following cycle #6 of FOLFIRI-the ulcers resolved following treatment with Diflucan. 15. Nausea during chemotherapy-Zofran was added to the chemotherapy regimen beginning on 07/09/2012.  Disposition:  She appears stable. The restaging CT reveals subjective slight enlargement of the central liver lesion and no other evidence of progressive metastatic disease. I discussed treatment options with Ms. Dadamo and her husband. We decided to continue 5-FU/Avastin on a 3 week schedule. We will check a CEA when she returns for chemotherapy in 3 weeks. Her case will be presented at the GI tumor conference on 08/28/2012. Ms. Kaczorowski will return for an office visit and chemotherapy in 6 weeks.   Thornton Papas, MD  08/20/2012  5:22 PM

## 2012-08-21 ENCOUNTER — Encounter: Payer: Self-pay | Admitting: *Deleted

## 2012-08-21 NOTE — Progress Notes (Signed)
Pt arrived to infusion at approximately 9:15am stating that pump had turned off at approx 2:30am and they could intermittently turn it on.  The pump would state "battery empty" even after changing batteries with new ones.  Pump changed out for a new pump.  There was 132 cc remaining the in bag and about 30 hrs of infusion time remaining.  Reprogrammed pump to new time and volume, keeping infusion rate the same.  Pt will return back at approx 1430 for pump stop.  Pt aware of appt, informed scheduling.  SLJ

## 2012-08-22 ENCOUNTER — Ambulatory Visit: Payer: Medicare HMO

## 2012-08-22 ENCOUNTER — Other Ambulatory Visit: Payer: Self-pay | Admitting: Certified Registered Nurse Anesthetist

## 2012-08-22 ENCOUNTER — Telehealth: Payer: Self-pay | Admitting: *Deleted

## 2012-08-22 VITALS — BP 119/65 | HR 70 | Temp 98.1°F

## 2012-08-22 DIAGNOSIS — C189 Malignant neoplasm of colon, unspecified: Secondary | ICD-10-CM

## 2012-08-22 LAB — CEA: CEA: 8.6 ng/mL — ABNORMAL HIGH (ref 0.0–5.0)

## 2012-08-22 MED ORDER — MAGIC MOUTHWASH: 10.0000 mL | Freq: Four times a day (QID) | ORAL | Status: DC | PRN

## 2012-08-22 MED ORDER — HEPARIN SOD (PORK) LOCK FLUSH 100 UNIT/ML IV SOLN
500.0000 [IU] | Freq: Once | INTRAVENOUS | Status: AC
  Administered 2012-08-22: 500 [IU]
  Filled 2012-08-22: qty 5

## 2012-08-22 MED ORDER — SODIUM CHLORIDE 0.9 % IJ SOLN
10.0000 mL | INTRAMUSCULAR | Status: DC | PRN
  Administered 2012-08-22: 10 mL
  Filled 2012-08-22: qty 10

## 2012-08-22 NOTE — Patient Instructions (Signed)
Call MD for problems 

## 2012-08-22 NOTE — Telephone Encounter (Signed)
Call from pt requesting mouthwash for sore mouth. Rx called to pharmacy.

## 2012-09-08 ENCOUNTER — Other Ambulatory Visit: Payer: Self-pay | Admitting: Oncology

## 2012-09-10 ENCOUNTER — Other Ambulatory Visit (HOSPITAL_BASED_OUTPATIENT_CLINIC_OR_DEPARTMENT_OTHER): Payer: Medicare HMO | Admitting: Lab

## 2012-09-10 ENCOUNTER — Ambulatory Visit (HOSPITAL_BASED_OUTPATIENT_CLINIC_OR_DEPARTMENT_OTHER): Payer: Medicare HMO

## 2012-09-10 VITALS — BP 110/71 | HR 67 | Temp 97.0°F

## 2012-09-10 DIAGNOSIS — C180 Malignant neoplasm of cecum: Secondary | ICD-10-CM

## 2012-09-10 DIAGNOSIS — Z5111 Encounter for antineoplastic chemotherapy: Secondary | ICD-10-CM

## 2012-09-10 DIAGNOSIS — C787 Secondary malignant neoplasm of liver and intrahepatic bile duct: Secondary | ICD-10-CM

## 2012-09-10 DIAGNOSIS — Z5112 Encounter for antineoplastic immunotherapy: Secondary | ICD-10-CM

## 2012-09-10 DIAGNOSIS — C189 Malignant neoplasm of colon, unspecified: Secondary | ICD-10-CM

## 2012-09-10 DIAGNOSIS — E039 Hypothyroidism, unspecified: Secondary | ICD-10-CM

## 2012-09-10 LAB — CBC WITH DIFFERENTIAL/PLATELET
BASO%: 0.3 % (ref 0.0–2.0)
HCT: 38.8 % (ref 34.8–46.6)
HGB: 13.3 g/dL (ref 11.6–15.9)
MCHC: 34.4 g/dL (ref 31.5–36.0)
MONO#: 0.7 10*3/uL (ref 0.1–0.9)
NEUT%: 61.4 % (ref 38.4–76.8)
WBC: 6.1 10*3/uL (ref 3.9–10.3)
lymph#: 1.5 10*3/uL (ref 0.9–3.3)

## 2012-09-10 LAB — UA PROTEIN, DIPSTICK - CHCC: Protein, ur: NEGATIVE mg/dL

## 2012-09-10 MED ORDER — LEUCOVORIN CALCIUM INJECTION 350 MG
375.0000 mg/m2 | Freq: Once | INTRAVENOUS | Status: AC
  Administered 2012-09-10: 660 mg via INTRAVENOUS
  Filled 2012-09-10: qty 33

## 2012-09-10 MED ORDER — FLUOROURACIL CHEMO INJECTION 500 MG/10ML
250.0000 mg/m2 | Freq: Once | INTRAVENOUS | Status: AC
  Administered 2012-09-10: 450 mg via INTRAVENOUS
  Filled 2012-09-10: qty 9

## 2012-09-10 MED ORDER — SODIUM CHLORIDE 0.9 % IV SOLN
1475.0000 mg/m2 | INTRAVENOUS | Status: DC
  Administered 2012-09-10: 2600 mg via INTRAVENOUS
  Filled 2012-09-10: qty 52

## 2012-09-10 MED ORDER — SODIUM CHLORIDE 0.9 % IV SOLN
Freq: Once | INTRAVENOUS | Status: AC
  Administered 2012-09-10: 11:00:00 via INTRAVENOUS

## 2012-09-10 MED ORDER — ONDANSETRON 8 MG/50ML IVPB (CHCC)
8.0000 mg | Freq: Once | INTRAVENOUS | Status: AC
  Administered 2012-09-10: 8 mg via INTRAVENOUS

## 2012-09-10 MED ORDER — SODIUM CHLORIDE 0.9 % IJ SOLN
10.0000 mL | INTRAMUSCULAR | Status: DC | PRN
  Filled 2012-09-10: qty 10

## 2012-09-10 MED ORDER — SODIUM CHLORIDE 0.9 % IV SOLN
5.0000 mg/kg | Freq: Once | INTRAVENOUS | Status: AC
  Administered 2012-09-10: 325 mg via INTRAVENOUS
  Filled 2012-09-10: qty 13

## 2012-09-10 NOTE — Patient Instructions (Addendum)
Return for Pump d/c:     [facility] Discharge Instructions for Patients Receiving Chemotherapy  Today you received the following chemotherapy agents: avastin, leucovorin, 5FU, 5FU pump.  To help prevent nausea and vomiting after your treatment, we encourage you to take your nausea medication.  Take it as often as prescribed.    If you develop nausea and vomiting that is not controlled by your nausea medication, call the clinic. If it is after clinic hours your family physician or the after hours number for the clinic or go to the Emergency Department.   BELOW ARE SYMPTOMS THAT SHOULD BE REPORTED IMMEDIATELY:  *FEVER GREATER THAN 100.5 F  *CHILLS WITH OR WITHOUT FEVER  NAUSEA AND VOMITING THAT IS NOT CONTROLLED WITH YOUR NAUSEA MEDICATION  *UNUSUAL SHORTNESS OF BREATH  *UNUSUAL BRUISING OR BLEEDING  TENDERNESS IN MOUTH AND THROAT WITH OR WITHOUT PRESENCE OF ULCERS  *URINARY PROBLEMS  *BOWEL PROBLEMS  UNUSUAL RASH Items with * indicate a potential emergency and should be followed up as soon as possible.  Feel free to call the clinic you have any questions or concerns. The clinic phone number is [phone].   I have been informed and understand all the instructions given to me. I know to contact the clinic, my physician, or go to the Emergency Department if any problems should occur. I do not have any questions at this time, but understand that I may call the clinic during office hours   should I have any questions or need assistance in obtaining follow up care.    __________________________________________  _____________  __________ Signature of Patient or Authorized Representative            Date                   Time    __________________________________________ Nurse's Signature

## 2012-09-10 NOTE — Progress Notes (Signed)
9fu pump connected without scanning.  Pump settings were programmed by Chrystie Nose and verified by Adella Hare.  Pump connected at 1415, patient to return at [facility] Thursday.

## 2012-09-12 ENCOUNTER — Ambulatory Visit (HOSPITAL_BASED_OUTPATIENT_CLINIC_OR_DEPARTMENT_OTHER): Payer: Medicare HMO

## 2012-09-12 VITALS — BP 130/78 | HR 73 | Temp 98.0°F

## 2012-09-12 DIAGNOSIS — C189 Malignant neoplasm of colon, unspecified: Secondary | ICD-10-CM

## 2012-09-12 DIAGNOSIS — C787 Secondary malignant neoplasm of liver and intrahepatic bile duct: Secondary | ICD-10-CM

## 2012-09-12 DIAGNOSIS — C180 Malignant neoplasm of cecum: Secondary | ICD-10-CM

## 2012-09-12 MED ORDER — HEPARIN SOD (PORK) LOCK FLUSH 100 UNIT/ML IV SOLN
500.0000 [IU] | Freq: Once | INTRAVENOUS | Status: AC
  Administered 2012-09-12: 500 [IU]
  Filled 2012-09-12: qty 5

## 2012-09-12 MED ORDER — SODIUM CHLORIDE 0.9 % IJ SOLN
10.0000 mL | INTRAMUSCULAR | Status: DC | PRN
  Administered 2012-09-12: 10 mL
  Filled 2012-09-12: qty 10

## 2012-09-29 ENCOUNTER — Other Ambulatory Visit: Payer: Self-pay | Admitting: Oncology

## 2012-10-01 ENCOUNTER — Ambulatory Visit (HOSPITAL_BASED_OUTPATIENT_CLINIC_OR_DEPARTMENT_OTHER): Payer: Medicare HMO | Admitting: Nurse Practitioner

## 2012-10-01 ENCOUNTER — Ambulatory Visit (HOSPITAL_BASED_OUTPATIENT_CLINIC_OR_DEPARTMENT_OTHER): Payer: Medicare HMO

## 2012-10-01 ENCOUNTER — Encounter: Payer: Self-pay | Admitting: Radiation Oncology

## 2012-10-01 ENCOUNTER — Other Ambulatory Visit (HOSPITAL_BASED_OUTPATIENT_CLINIC_OR_DEPARTMENT_OTHER): Payer: Medicare HMO | Admitting: Lab

## 2012-10-01 ENCOUNTER — Telehealth: Payer: Self-pay | Admitting: *Deleted

## 2012-10-01 ENCOUNTER — Other Ambulatory Visit: Payer: Self-pay | Admitting: Oncology

## 2012-10-01 ENCOUNTER — Telehealth: Payer: Self-pay | Admitting: Oncology

## 2012-10-01 VITALS — BP 137/70 | HR 65

## 2012-10-01 VITALS — BP 122/70 | HR 74 | Temp 98.7°F | Resp 18 | Ht 60.0 in | Wt 140.7 lb

## 2012-10-01 DIAGNOSIS — C787 Secondary malignant neoplasm of liver and intrahepatic bile duct: Secondary | ICD-10-CM

## 2012-10-01 DIAGNOSIS — Z5111 Encounter for antineoplastic chemotherapy: Secondary | ICD-10-CM

## 2012-10-01 DIAGNOSIS — C180 Malignant neoplasm of cecum: Secondary | ICD-10-CM

## 2012-10-01 DIAGNOSIS — C189 Malignant neoplasm of colon, unspecified: Secondary | ICD-10-CM

## 2012-10-01 DIAGNOSIS — D702 Other drug-induced agranulocytosis: Secondary | ICD-10-CM

## 2012-10-01 DIAGNOSIS — M419 Scoliosis, unspecified: Secondary | ICD-10-CM | POA: Insufficient documentation

## 2012-10-01 DIAGNOSIS — E039 Hypothyroidism, unspecified: Secondary | ICD-10-CM | POA: Insufficient documentation

## 2012-10-01 DIAGNOSIS — R110 Nausea: Secondary | ICD-10-CM

## 2012-10-01 DIAGNOSIS — M21379 Foot drop, unspecified foot: Secondary | ICD-10-CM | POA: Insufficient documentation

## 2012-10-01 DIAGNOSIS — Z5112 Encounter for antineoplastic immunotherapy: Secondary | ICD-10-CM

## 2012-10-01 DIAGNOSIS — I341 Nonrheumatic mitral (valve) prolapse: Secondary | ICD-10-CM | POA: Insufficient documentation

## 2012-10-01 DIAGNOSIS — C801 Malignant (primary) neoplasm, unspecified: Secondary | ICD-10-CM | POA: Insufficient documentation

## 2012-10-01 DIAGNOSIS — F329 Major depressive disorder, single episode, unspecified: Secondary | ICD-10-CM | POA: Insufficient documentation

## 2012-10-01 DIAGNOSIS — E785 Hyperlipidemia, unspecified: Secondary | ICD-10-CM | POA: Insufficient documentation

## 2012-10-01 LAB — CBC WITH DIFFERENTIAL/PLATELET
BASO%: 0.3 % (ref 0.0–2.0)
Eosinophils Absolute: 0.1 10*3/uL (ref 0.0–0.5)
HCT: 40.9 % (ref 34.8–46.6)
LYMPH%: 26.5 % (ref 14.0–49.7)
MCHC: 33.6 g/dL (ref 31.5–36.0)
MONO#: 0.7 10*3/uL (ref 0.1–0.9)
NEUT#: 3.7 10*3/uL (ref 1.5–6.5)
Platelets: 207 10*3/uL (ref 145–400)
RBC: 4.18 10*6/uL (ref 3.70–5.45)
WBC: 6.2 10*3/uL (ref 3.9–10.3)
lymph#: 1.6 10*3/uL (ref 0.9–3.3)

## 2012-10-01 LAB — COMPREHENSIVE METABOLIC PANEL (CC13)
ALT: 49 U/L (ref 0–55)
AST: 47 U/L — ABNORMAL HIGH (ref 5–34)
Albumin: 3.4 g/dL — ABNORMAL LOW (ref 3.5–5.0)
CO2: 27 mEq/L (ref 22–29)
Calcium: 10.2 mg/dL (ref 8.4–10.4)
Chloride: 101 mEq/L (ref 98–107)
Creatinine: 0.7 mg/dL (ref 0.6–1.1)
Potassium: 4.1 mEq/L (ref 3.5–5.1)
Total Protein: 8.1 g/dL (ref 6.4–8.3)

## 2012-10-01 LAB — CEA: CEA: 9.1 ng/mL — ABNORMAL HIGH (ref 0.0–5.0)

## 2012-10-01 MED ORDER — SODIUM CHLORIDE 0.9 % IV SOLN
1475.0000 mg/m2 | INTRAVENOUS | Status: DC
  Administered 2012-10-01: 2600 mg via INTRAVENOUS
  Filled 2012-10-01: qty 52

## 2012-10-01 MED ORDER — SODIUM CHLORIDE 0.9 % IV SOLN: Freq: Once | INTRAVENOUS | Status: DC

## 2012-10-01 MED ORDER — ONDANSETRON 8 MG/50ML IVPB (CHCC)
8.0000 mg | Freq: Once | INTRAVENOUS | Status: AC
  Administered 2012-10-01: 8 mg via INTRAVENOUS

## 2012-10-01 MED ORDER — SODIUM CHLORIDE 0.9 % IV SOLN
Freq: Once | INTRAVENOUS | Status: AC
  Administered 2012-10-01: 11:00:00 via INTRAVENOUS

## 2012-10-01 MED ORDER — LEUCOVORIN CALCIUM INJECTION 350 MG
375.0000 mg/m2 | Freq: Once | INTRAVENOUS | Status: AC
  Administered 2012-10-01: 660 mg via INTRAVENOUS
  Filled 2012-10-01: qty 33

## 2012-10-01 MED ORDER — FLUOROURACIL CHEMO INJECTION 500 MG/10ML
250.0000 mg/m2 | Freq: Once | INTRAVENOUS | Status: AC
  Administered 2012-10-01: 450 mg via INTRAVENOUS
  Filled 2012-10-01: qty 9

## 2012-10-01 MED ORDER — SODIUM CHLORIDE 0.9 % IV SOLN
5.0000 mg/kg | Freq: Once | INTRAVENOUS | Status: AC
  Administered 2012-10-01: 325 mg via INTRAVENOUS
  Filled 2012-10-01: qty 13

## 2012-10-01 MED ORDER — SODIUM CHLORIDE 0.9 % IJ SOLN
10.0000 mL | INTRAMUSCULAR | Status: DC | PRN
  Administered 2012-10-01: 10 mL
  Filled 2012-10-01: qty 10

## 2012-10-01 MED ORDER — SODIUM CHLORIDE 0.9 % IJ SOLN
10.0000 mL | INTRAMUSCULAR | Status: DC | PRN
Start: 2012-10-01 — End: 2012-10-01
  Administered 2012-10-01: 10 mL
  Filled 2012-10-01: qty 10

## 2012-10-01 NOTE — Telephone Encounter (Signed)
Per staff message and POF I have scheduled appts.  JMW  

## 2012-10-01 NOTE — Patient Instructions (Signed)
RETURN FOR PUMP DISCONNECT Thursday 10/03/2012 AT     [facility] Discharge Instructions for Patients Receiving Chemotherapy  Today you received the following chemotherapy agents: avastin, leucovorin, 21fu, 48fu pump.  To help prevent nausea and vomiting after your treatment, we encourage you to take your nausea medication.  Take it as often as prescribed.    If you develop nausea and vomiting that is not controlled by your nausea medication, call the clinic. If it is after clinic hours your family physician or the after hours number for the clinic or go to the Emergency Department.   BELOW ARE SYMPTOMS THAT SHOULD BE REPORTED IMMEDIATELY:  *FEVER GREATER THAN 100.5 F  *CHILLS WITH OR WITHOUT FEVER  NAUSEA AND VOMITING THAT IS NOT CONTROLLED WITH YOUR NAUSEA MEDICATION  *UNUSUAL SHORTNESS OF BREATH  *UNUSUAL BRUISING OR BLEEDING  TENDERNESS IN MOUTH AND THROAT WITH OR WITHOUT PRESENCE OF ULCERS  *URINARY PROBLEMS  *BOWEL PROBLEMS  UNUSUAL RASH Items with * indicate a potential emergency and should be followed up as soon as possible.  Feel free to call the clinic you have any questions or concerns. The clinic phone number is [phone].   I have been informed and understand all the instructions given to me. I know to contact the clinic, my physician, or go to the Emergency Department if any problems should occur. I do not have any questions at this time, but understand that I may call the clinic during office hours   should I have any questions or need assistance in obtaining follow up care.    __________________________________________  _____________  __________ Signature of Patient or Authorized Representative            Date                   Time    __________________________________________ Nurse's Signature

## 2012-10-01 NOTE — Progress Notes (Signed)
OFFICE PROGRESS NOTE  Interval history:  April Spence returns as scheduled. She was last treated with 5-FU and Avastin on 09/10/2012. She continues to have intermittent diarrhea. The diarrhea is controlled with Lomotil. She has intermittent mild nausea. She had a few mouth sores following the chemotherapy. The mouth sores have resolved. She intermittently notes blood with nose blowing. No other bleeding. No shortness of breath or chest pain.   Objective: Blood pressure 122/70, pulse 74, temperature 98.7 F (37.1 C), resp. rate 18, height 5' (1.524 m), weight 140 lb 11.2 oz (63.821 kg).  Oropharynx is without thrush or ulceration. Lungs are clear. Regular cardiac rhythm. Port-A-Cath site is without erythema. Abdomen is soft and nontender. No organomegaly. Trace lower leg edema bilaterally right slightly greater than left.  Lab Results: Lab Results  Component Value Date   WBC 6.2 10/01/2012   HGB 13.8 10/01/2012   HCT 40.9 10/01/2012   MCV 98.0 10/01/2012   PLT 207 10/01/2012    Chemistry:    Chemistry      Component Value Date/Time   NA 138 08/20/2012 0955   NA 135 04/02/2012 0937   NA 136 05/02/2011 0954   K 4.2 08/20/2012 0955   K 3.8 04/02/2012 0937   K 4.5 05/02/2011 0954   CL 102 08/20/2012 0955   CL 101 04/02/2012 0937   CL 93* 05/02/2011 0954   CO2 27 08/20/2012 0955   CO2 26 04/02/2012 0937   CO2 29 05/02/2011 0954   BUN 15.0 08/20/2012 0955   BUN 11 04/02/2012 0937   BUN 12 05/02/2011 0954   CREATININE 0.7 08/20/2012 0955   CREATININE 0.42* 04/02/2012 0937   CREATININE 0.5* 05/02/2011 0954      Component Value Date/Time   CALCIUM 9.7 08/20/2012 0955   CALCIUM 9.8 04/02/2012 0937   CALCIUM 9.3 05/02/2011 0954   ALKPHOS 86 08/20/2012 0955   ALKPHOS 66 04/02/2012 0937   ALKPHOS 78 05/02/2011 0954   AST 33 08/20/2012 0955   AST 34 04/02/2012 0937   AST 31 05/02/2011 0954   ALT 29 08/20/2012 0955   ALT 29 04/02/2012 0937   BILITOT 0.43 08/20/2012 0955   BILITOT 0.2* 04/02/2012 0937     BILITOT 0.40 05/02/2011 0954       Studies/Results: No results found.  Medications: I have reviewed the patient's current medications.  Assessment/Plan:  1. 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 She completed 12 cycles of FOLFOX chemotherapy. Oxaliplatin was held with cycles number 5, 8, 11, and 12. 2. Metastatic colon cancer confirmed on a restaging CT evaluation 05/02/2011 with liver metastases and periportal lymphadenopathy. She began treatment with FOLFIRI on 06/01/2011. Avastin was added beginning with cycle number 2. She completed cycle #6 08/08/2011. Restaging CT evaluation 08/15/2011 showed improvement in the liver metastases and porta hepatis lymphadenopathy. She completed cycle #12 FOLFIRI/Avastin 11/07/2011. Restaging CT evaluation 11/24/2011 showed decrease in size of hepatic metastases and no evidence of disease progression or new metastasis. She completed cycle 18 on 03/11/2012. A restaging CT 03/29/2012 revealed stable disease. Irinotecan was discontinued and treatment continued with 5-FU/leucovorin and Avastin on a 3 week schedule beginning 04/02/2012. -restaging CT 08/15/2012 with slight enlargement of the central liver lesion and progression of intrahepatic biliary dilatation, no other liver lesions and no other evidence of progressive metastatic disease. CEA stable on 08/20/2012. Treatment continued with 5-FU and Avastin  on a 3 week schedule. 3. Status post Port-A-Cath placement and Port-A-Cath removal by Dr. Michaell Cowing. A new Port-A-Cath was placed on 05/16/2011. The Port-A-Cath was removed due to malposition. A new Port-A-Cath was placed on 05/30/2011. 4. Spina bifida with severe scoliosis.  5. Chronic bilateral foot drop/lower leg and foot numbness. 6. Hypothyroidism. 7. History of chemotherapy-induced diarrhea. The bolus and infusional 5-FU were reduced by  25% beginning with cycle number 2 of FOLFOX. The 5-FU dose was decreased by an additional 10% beginning with cycle number 8. 8. History of delayed nausea following chemotherapy. 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 The oxaliplatin was dose reduced by 20% beginning with cycle number 6. The neuropathy symptoms resolved. 10. History of neutropenia secondary to chemotherapy. 11. Status post a colonoscopy 05/05/2010. There was a previous right colectomy anastomosis and slight nodularity. Biopsies were taken which showed benign colonic mucosa with hyperplastic epithelial changes. No significant inflammation, granuloma, or adenomatous epithelium was identified. 12. Diarrhea following FOLFIRI chemotherapy. Overall the diarrhea is controlled with Lomotil.  13. History of a fungal rash in the groin, improved with Diflucan and nystatin powder. 14. Mouth ulcers, oral candidiasis following cycle #6 of FOLFIRI-the ulcers resolved following treatment with Diflucan. 15. Nausea during chemotherapy-Zofran was added to the chemotherapy regimen beginning on 07/09/2012.  Disposition-Ms. Seeman appears stable. Plan to continue every 3 week 5-FU and Avastin with treatment today as scheduled.  Dr. Truett Perna presented her case at the GI cancer conference. Surgery is not felt to be an option. The general consensus was to consider stereotactic radiosurgery. We are making a referral to Dr. Mitzi Hansen.  She will return for treatment in 3 weeks and a followup visit and treatment in 6 weeks. She will contact the office in the interim with any problems.  Plan per Dr. Truett Perna.  Lonna Cobb ANP/GNP-BC

## 2012-10-01 NOTE — Telephone Encounter (Signed)
gv and printed appt schedule for Jan thru March...emailed michelle to add tx....schedule pt with Dr. Mitzi Hansen for 1.23.14@ 12:30pm...the patient aware

## 2012-10-02 ENCOUNTER — Encounter: Payer: Self-pay | Admitting: Radiation Oncology

## 2012-10-03 ENCOUNTER — Telehealth: Payer: Self-pay | Admitting: *Deleted

## 2012-10-03 ENCOUNTER — Ambulatory Visit
Admission: RE | Admit: 2012-10-03 | Discharge: 2012-10-03 | Disposition: A | Discharge status: Home / Self Care | Payer: Medicare HMO | Source: Ambulatory Visit | Attending: Radiation Oncology | Admitting: Radiation Oncology

## 2012-10-03 ENCOUNTER — Ambulatory Visit (HOSPITAL_BASED_OUTPATIENT_CLINIC_OR_DEPARTMENT_OTHER): Payer: Medicare HMO

## 2012-10-03 ENCOUNTER — Encounter: Payer: Self-pay | Admitting: Oncology

## 2012-10-03 ENCOUNTER — Encounter: Payer: Self-pay | Admitting: Radiation Oncology

## 2012-10-03 VITALS — BP 122/79 | HR 72 | Temp 97.4°F

## 2012-10-03 VITALS — BP 131/80 | HR 69 | Temp 98.0°F | Wt 141.7 lb

## 2012-10-03 DIAGNOSIS — C189 Malignant neoplasm of colon, unspecified: Secondary | ICD-10-CM | POA: Insufficient documentation

## 2012-10-03 DIAGNOSIS — C180 Malignant neoplasm of cecum: Secondary | ICD-10-CM

## 2012-10-03 DIAGNOSIS — C787 Secondary malignant neoplasm of liver and intrahepatic bile duct: Secondary | ICD-10-CM | POA: Insufficient documentation

## 2012-10-03 MED ORDER — HEPARIN SOD (PORK) LOCK FLUSH 100 UNIT/ML IV SOLN
500.0000 [IU] | Freq: Once | INTRAVENOUS | Status: AC
  Administered 2012-10-03: 500 [IU]
  Filled 2012-10-03: qty 5

## 2012-10-03 MED ORDER — SODIUM CHLORIDE 0.9 % IJ SOLN
10.0000 mL | INTRAMUSCULAR | Status: DC | PRN
  Administered 2012-10-03: 10 mL
  Filled 2012-10-03: qty 10

## 2012-10-03 NOTE — Patient Instructions (Signed)
Call MD for problems or concerns 

## 2012-10-03 NOTE — Progress Notes (Signed)
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

## 2012-10-03 NOTE — Telephone Encounter (Signed)
Call from pt, she just saw Dr. Mitzi Hansen. Asking for Dr. Truett Perna to call her to clarify CT findings. They were under the understanding she had more than one liver lesion. Does this mean the other lesion(s) responded to chemo?

## 2012-10-03 NOTE — Telephone Encounter (Signed)
CALLED PATIENT TO INFORM OF TEST , FNC VIST AND SIM APPT. , SPOKE WITH PATIENT AND SHE IS AWARE OF THESE APPTS.

## 2012-10-03 NOTE — Progress Notes (Signed)
Please see the Nurse Progress Note in the MD Initial Consult Encounter for this patient. 

## 2012-10-03 NOTE — Telephone Encounter (Signed)
XXXX 

## 2012-10-03 NOTE — Progress Notes (Addendum)
Radiation Oncology         [phone] ________________________________  Name: April Spence MRN: [id number]  Date: 10/03/2012  DOB: 25-Dec-1955  WU:[id number],NWGNFAO DAVID, MD  Ladene Artist, MD     REFERRING PHYSICIAN: Ladene Artist, MD   DIAGNOSIS: Metastatic colon cancer  HISTORY OF PRESENT ILLNESS::April Spence is a 57 y.o. female who is seen for an initial consultation visit. The patient has a history of metastatic adenocarcinoma of the cecum. She was originally diagnosed in 2010 and proceeded with a right colectomy for what was staged as a T3 N1 tumor. The patient then received subsequent FOLFOX chemotherapy adjuvantly. Metastatic colon cancer was confirmed with liver metastases in 2012. She has undergone multiple courses of palliative chemotherapy since that time. Most recently she has continued on 5-FU with leucovorin and Avastin on a 3 week basis.  A restaging CT scan was completed on 08/15/2012. This revealed a 3.1 x 2.7 x 2.9 cm tumor which appeared slightly larger within the central liver involving the right hepatic lobe. No other liver lytic lesions were identified. No other abdominal metastases were identified. Prior additional liver metastases have responded very well to her course of palliative chemotherapy.  Patient's case was discussed at GI tumor conference. She was felt to be a potential candidate for a course of stereotactic body radiotherapy to this dominant remaining lesion and I been asked to see her today regarding this.   PREVIOUS RADIATION THERAPY: No   PAST MEDICAL HISTORY:  has a past medical history of colon ca (dx'd 04/2009); Spina bifida; Scoliosis; Hyperlipidemia; Foot drop; Arrhythmia; Mitral valve prolapse; Hypothyroidism; Depression; History of chemotherapy; and Colon cancer.     PAST SURGICAL HISTORY: Past Surgical History  Procedure Date  . Cholecystectomy   . Back surgery 1980 and 2010  . Knee arthroscopy 2005    left  . Tubal ligation  1973  . Colonoscopy 05/05/2010  . Colon surgery     2010  . Portacath placement 05/30/2011    tip at cavoatrialjjunction by xray     FAMILY HISTORY: family history includes Cancer in her maternal grandmother and sister and Cancer (age of onset:42) in her other.   SOCIAL HISTORY:  reports that she quit smoking about 6 years ago. She does not have any smokeless tobacco history on file. She reports that she does not drink alcohol or use illicit drugs.   ALLERGIES: Adhesive   MEDICATIONS:  Current Outpatient Prescriptions  Medication Sig Dispense Refill  . Alum & Mag Hydroxide-Simeth (MAGIC MOUTHWASH) SOLN Take 10 mLs by mouth 4 (four) times daily as needed. Swish and spit  240 mL  2  . Cholecalciferol (VITAMIN D PO) Take 1 tablet by mouth daily.       . Cyanocobalamin (VITAMIN B-12 IJ) Inject as directed every 14 (fourteen) days.       . diphenoxylate-atropine (LOMOTIL) 2.5-0.025 MG per tablet ONE TO TWO TABS FOUR TIMES A DAY PRN.  120 tablet  3  . fenofibrate 54 MG tablet Take 134 mg by mouth daily.       Marland Kitchen FLUoxetine (PROZAC) 20 MG capsule Take 20 mg by mouth daily.       Marland Kitchen levothyroxine (SYNTHROID) 150 MCG tablet Take 1 tablet (150 mcg total) by mouth daily.      Marland Kitchen lidocaine-prilocaine (EMLA) cream Apply topically as needed. APPLY TO PORTACATH SITE 1-2 HOURS PRIOR TO USE  30 g  5  . LORazepam (ATIVAN) 0.5 MG tablet Take 1 tablet (  0.5 mg total) by mouth every 12 (twelve) hours as needed for anxiety.  30 tablet  2  . nystatin (NYSTOP) 100000 UNIT/GM POWD Apply topically as needed.      Marland Kitchen oxyCODONE-acetaminophen (PERCOCET) 5-325 MG per tablet Take 1 tablet by mouth every 6 (six) hours as needed.      . propranolol (INDERAL) 80 MG tablet 80 mg daily.       . traMADol-acetaminophen (ULTRACET) 37.5-325 MG per tablet       . amitriptyline (ELAVIL) 25 MG tablet Take 25 mg by mouth daily.       . prochlorperazine (COMPAZINE) 10 MG tablet Take 10 mg by mouth every 6 (six) hours as needed.          REVIEW OF SYSTEMS:  A 15 point review of systems is documented in the electronic medical record. This was obtained by the nursing staff. However, I reviewed this with the patient to discuss relevant findings and make appropriate changes.  Pertinent items are noted in HPI.    PHYSICAL EXAM:  weight is 141 lb 11.2 oz (64.275 kg). Her temperature is 98 F (36.7 C). Her blood pressure is 131/80 and her pulse is 69. Her oxygen saturation is 97%.   General: Well-developed, in no acute distress, sitting in a wheelchair. The patient has significant scoliosis HEENT: Normocephalic, atraumatic; oral cavity clear Neck: Supple without any lymphadenopathy Cardiovascular: Regular rate and rhythm Respiratory: Clear to auscultation bilaterally GI: Soft, nontender, normal bowel sounds Extremities: No edema present Neuro: No focal deficits    LABORATORY DATA:  Lab Results  Component Value Date   WBC 6.2 10/01/2012   HGB 13.8 10/01/2012   HCT 40.9 10/01/2012   MCV 98.0 10/01/2012   PLT 207 10/01/2012   Lab Results  Component Value Date   NA 138 10/01/2012   K 4.1 10/01/2012   CL 101 10/01/2012   CO2 27 10/01/2012   Lab Results  Component Value Date   ALT 49 10/01/2012   AST 47* 10/01/2012   ALKPHOS 89 10/01/2012   BILITOT 0.42 10/01/2012      RADIOGRAPHY: No results found.     IMPRESSION: The patient is a pleasant 57 year old female with a history of metastatic colon cancer with liver metastases. She has a remaining tumor centrally within the liver which appears to be an appropriate target for stereotactic body radiotherapy. She has been receiving chemotherapy on an every three-week basis.   PLAN: I discussed with the patient a potential course of stereotactic body radiotherapy. I would anticipate 3-5 fractions over 1 week to one and a half weeks. We discussed the rationale of this treatment in addition to the possible side effects of such a treatment as well. All of her questions were  answered.  I would like to proceed with an MRI scan of the liver to define the central tumor as precisely as possible and to also rule out other metastases which are remaining. This would serve as a good baseline study pre-treatment. We will use this study to a planning CT scan to facilitate the treatment planning process.  The patient will therefore proceed with an MRI scan and return to clinic to review the results. We will also attempt to schedule a simulation on the same day such that we can plan to proceed with treatment planning at that time.    I spent 60 minutes minutes face to face with the patient and more than 50% of that time was spent in counseling and/or coordination  of care.    ________________________________   Radene Gunning, MD, PhD

## 2012-10-04 ENCOUNTER — Telehealth: Payer: Self-pay | Admitting: *Deleted

## 2012-10-04 NOTE — Telephone Encounter (Signed)
Called patient to inform of test being moved from 1-27 to 1-30, spoke with patient and she is aware of this change.

## 2012-10-04 NOTE — Addendum Note (Signed)
Encounter addended by: Lowella Petties, RN on: 10/04/2012  9:31 AM<BR>     Documentation filed: Charges VN

## 2012-10-07 ENCOUNTER — Ambulatory Visit (HOSPITAL_COMMUNITY): Payer: Medicare HMO

## 2012-10-09 ENCOUNTER — Encounter: Payer: Self-pay | Admitting: Radiation Oncology

## 2012-10-09 DIAGNOSIS — C787 Secondary malignant neoplasm of liver and intrahepatic bile duct: Secondary | ICD-10-CM | POA: Insufficient documentation

## 2012-10-09 NOTE — Addendum Note (Signed)
Encounter addended by: Jonna Coup, MD on: 10/09/2012 11:03 AM<BR>     Documentation filed: Notes Section

## 2012-10-10 ENCOUNTER — Ambulatory Visit: Payer: Medicare HMO | Admitting: Radiation Oncology

## 2012-10-10 ENCOUNTER — Ambulatory Visit: Payer: Medicare HMO

## 2012-10-10 ENCOUNTER — Other Ambulatory Visit: Payer: Self-pay | Admitting: Radiation Oncology

## 2012-10-10 ENCOUNTER — Ambulatory Visit (HOSPITAL_COMMUNITY)
Admission: RE | Admit: 2012-10-10 | Discharge: 2012-10-10 | Disposition: A | Discharge status: Home / Self Care | Payer: Medicare HMO | Source: Ambulatory Visit | Attending: Radiation Oncology | Admitting: Radiation Oncology

## 2012-10-10 ENCOUNTER — Telehealth: Payer: Self-pay | Admitting: Radiation Oncology

## 2012-10-10 ENCOUNTER — Ambulatory Visit
Admission: RE | Admit: 2012-10-10 | Discharge: 2012-10-10 | Disposition: A | Discharge status: Home / Self Care | Payer: Medicare HMO | Source: Ambulatory Visit | Attending: Radiation Oncology | Admitting: Radiation Oncology

## 2012-10-10 DIAGNOSIS — C787 Secondary malignant neoplasm of liver and intrahepatic bile duct: Secondary | ICD-10-CM | POA: Insufficient documentation

## 2012-10-10 DIAGNOSIS — Z510 Encounter for antineoplastic radiation therapy: Secondary | ICD-10-CM | POA: Insufficient documentation

## 2012-10-10 DIAGNOSIS — K838 Other specified diseases of biliary tract: Secondary | ICD-10-CM | POA: Insufficient documentation

## 2012-10-10 DIAGNOSIS — M4120 Other idiopathic scoliosis, site unspecified: Secondary | ICD-10-CM | POA: Insufficient documentation

## 2012-10-10 DIAGNOSIS — C189 Malignant neoplasm of colon, unspecified: Secondary | ICD-10-CM | POA: Insufficient documentation

## 2012-10-10 DIAGNOSIS — Z79899 Other long term (current) drug therapy: Secondary | ICD-10-CM | POA: Insufficient documentation

## 2012-10-10 DIAGNOSIS — Z9089 Acquired absence of other organs: Secondary | ICD-10-CM | POA: Insufficient documentation

## 2012-10-10 MED ORDER — GADOBENATE DIMEGLUMINE 529 MG/ML IV SOLN
15.0000 mL | Freq: Once | INTRAVENOUS | Status: AC | PRN
  Administered 2012-10-10: 13 mL via INTRAVENOUS

## 2012-10-10 NOTE — Telephone Encounter (Signed)
Met w patient to discuss RO billing. Pt had no financial concerns today. However, advise she will set  up a pmyt plan, if need be.  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

## 2012-10-17 ENCOUNTER — Telehealth: Payer: Self-pay | Admitting: *Deleted

## 2012-10-17 NOTE — Telephone Encounter (Signed)
Patient called and left message to cancel appt  Next week. Message forwarded to the desk RN.  JMW

## 2012-10-17 NOTE — Telephone Encounter (Addendum)
Call from pt, she is scheduled for radiation to begin next week. Asking if she should keep chemo appt 2/11 as scheduled? Reviewed with Dr. Truett Perna, will cancel chemo appt. Follow up with Dr. Truett Perna as scheduled. Pt voiced understanding.

## 2012-10-21 ENCOUNTER — Ambulatory Visit
Admission: RE | Admit: 2012-10-21 | Discharge: 2012-10-21 | Disposition: A | Discharge status: Home / Self Care | Payer: Medicare HMO | Source: Ambulatory Visit | Attending: Radiation Oncology | Admitting: Radiation Oncology

## 2012-10-21 ENCOUNTER — Other Ambulatory Visit: Payer: Self-pay | Admitting: *Deleted

## 2012-10-21 DIAGNOSIS — C787 Secondary malignant neoplasm of liver and intrahepatic bile duct: Secondary | ICD-10-CM

## 2012-10-21 DIAGNOSIS — C189 Malignant neoplasm of colon, unspecified: Secondary | ICD-10-CM

## 2012-10-21 MED ORDER — DIPHENOXYLATE-ATROPINE 2.5-0.025 MG PO TABS: ORAL_TABLET | ORAL | Status: DC

## 2012-10-21 NOTE — Progress Notes (Signed)
  Radiation Oncology         [phone] ________________________________  Name: April Spence MRN: [id number]  Date: 10/21/2012  DOB: 1956-06-06  Simulation verification note  Diagnosis: liver metastasis/ colon cancer  The patient underwent film verification for the patient's set-up in preparation for stereotactic body radiosurgery. The patient was placed on the treatment unit and a CT scan was performed. These images were then fused with the patient's planning CT scan. The fusion was carefully reviewed in terms of the patient's anatomy as it related to the planning CT scan. The target structures as well as the organs at risk were evaluated and also the isodose curves were also reviewed on the patient's treatment CT scan. The target and the normal structures were appropriately aligned for treatment. Therefore the patient proceeded with the first fraction of stereotactic body radiosurgery.  Plan: The patient will return later this week for her second fraction. A total of 3 fractions are planned.  ________________________________  Radene Gunning, MD, PhD

## 2012-10-22 ENCOUNTER — Ambulatory Visit: Payer: Medicare HMO

## 2012-10-22 ENCOUNTER — Other Ambulatory Visit: Payer: Medicare HMO | Admitting: Lab

## 2012-10-22 ENCOUNTER — Ambulatory Visit: Payer: Medicare HMO | Admitting: Radiation Oncology

## 2012-10-23 ENCOUNTER — Ambulatory Visit: Payer: Medicare HMO | Admitting: Radiation Oncology

## 2012-10-24 ENCOUNTER — Ambulatory Visit: Payer: Medicare HMO | Admitting: Radiation Oncology

## 2012-10-25 ENCOUNTER — Ambulatory Visit
Admission: RE | Admit: 2012-10-25 | Discharge: 2012-10-25 | Disposition: A | Discharge status: Home / Self Care | Payer: Medicare HMO | Source: Ambulatory Visit | Attending: Radiation Oncology | Admitting: Radiation Oncology

## 2012-10-25 ENCOUNTER — Ambulatory Visit: Payer: Medicare HMO | Admitting: Radiation Oncology

## 2012-10-25 VITALS — BP 120/103 | HR 70 | Temp 97.8°F

## 2012-10-25 DIAGNOSIS — C787 Secondary malignant neoplasm of liver and intrahepatic bile duct: Secondary | ICD-10-CM

## 2012-10-25 NOTE — Progress Notes (Signed)
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  SITE:  Liver metastasis   CONSENT VERIFIED:yes   SET UP: Patient is setup supine   IMMOBILIZATION: The patient was immobilized using a Vac Loc bag, and a customized active form device was also constructed to aid in patient immobilization. The patient set up also involved abdominal compression to attempt to reduce respiratory motion. A total of 2 complex treatment devices therefore will be used for immobilization during the course of radiation.    NARRATIVE:The patient was brought to the CT Simulation planning suite.  Identity was confirmed.  All relevant records and images related to the planned course of therapy were reviewed.  Then, the patient was positioned in a stable reproducible clinical set-up for radiation therapy. Abdominal compression was applied by me.  4D CT images were obtained and reproducible breathing pattern was confirmed. Free breathing CT images were obtained.  Skin markings were placed.  The CT images were loaded into the planning software where the target and avoidance structures were contoured.  The radiation prescription was entered and confirmed.    TREATMENT PLANNING NOTE:  Treatment planning then occurred. I have requested : MLC's, 3D simulation/ isodose plan, basic dose calculation. It is anticipated that 2 customized fields/rapid arcs will be used for the patient's treatment, with each of these corresponding to an additional complex treatment device.  3 dimensional simulation is performed and dose volume histogram of the gross tumor volume, planning tumor volume and criticial normal structures including the spinal cord and lungs were analyzed and requested.  Special treatment procedure was  performed due to high dose per fraction and the complexity of the planning process.  The patient will be monitored for increased risk of toxicity.  Daily imaging using cone beam CT/ MV CT will be used for target localization.   PLAN:  The patient will receive 54 Gy in 3 fractions.    ________________________________   Radene Gunning, MD, PhD

## 2012-10-25 NOTE — Progress Notes (Signed)
Routine of clinic reviewed.Given one month follow up card to schedule return in one month completion of treatment on Monday.

## 2012-10-25 NOTE — Progress Notes (Signed)
  Radiation Oncology         [phone] ________________________________  Name: KRINA MRAZ MRN: [id number]  Date: 10/25/2012  DOB: December 25, 1955   Simulation verification note  The patient underwent film verification for the patient's set-up in preparation for stereotactic body radiosurgery. The patient was placed on the treatment unit and a CT scan was performed. These images were then fused with the patient's planning CT scan. The fusion was carefully reviewed in terms of the patient's anatomy as it related to the planning CT scan. The target structures as well as the organs at risk were evaluated on the patient's treatment CT scan. The target and the normal structures were appropriately aligned for treatment. Therefore the patient proceeded with the fraction of stereotactic body radiosurgery.  Fraction: 2  Dose:  36 Gy   ________________________________  Radene Gunning, MD, PhD

## 2012-10-25 NOTE — Progress Notes (Signed)
Patient here for weekly assessment of radiation to liver.Continued chronic back pain which she has medications for.Encouraged to take anti-emetic prior to radiation and as needed.Completed 2 of 3 treatments.

## 2012-10-25 NOTE — Progress Notes (Signed)
   Department of Radiation Oncology  Phone:  [phone] Fax:        [phone]  Weekly Treatment Note    Name: April Spence Date: 10/25/2012 MRN: [id number] DOB: Mar 09, 1956   Current dose: 36 Gy  Current fraction: 2   MEDICATIONS: Current Outpatient Prescriptions  Medication Sig Dispense Refill  . Alum & Mag Hydroxide-Simeth (MAGIC MOUTHWASH) SOLN Take 10 mLs by mouth 4 (four) times daily as needed. Swish and spit  240 mL  2  . amitriptyline (ELAVIL) 25 MG tablet Take 25 mg by mouth daily.       . Cholecalciferol (VITAMIN D PO) Take 1 tablet by mouth daily.       . Cyanocobalamin (VITAMIN B-12 IJ) Inject as directed every 14 (fourteen) days.       . diphenoxylate-atropine (LOMOTIL) 2.5-0.025 MG per tablet ONE TO TWO TABS FOUR TIMES A DAY PRN.  120 tablet  3  . fenofibrate 54 MG tablet Take 134 mg by mouth daily.       Marland Kitchen FLUoxetine (PROZAC) 20 MG capsule Take 20 mg by mouth daily.       Marland Kitchen levothyroxine (SYNTHROID) 150 MCG tablet Take 1 tablet (150 mcg total) by mouth daily.      Marland Kitchen lidocaine-prilocaine (EMLA) cream Apply topically as needed. APPLY TO PORTACATH SITE 1-2 HOURS PRIOR TO USE  30 g  5  . LORazepam (ATIVAN) 0.5 MG tablet Take 1 tablet (0.5 mg total) by mouth every 12 (twelve) hours as needed for anxiety.  30 tablet  2  . nystatin (NYSTOP) 100000 UNIT/GM POWD Apply topically as needed.      Marland Kitchen oxyCODONE-acetaminophen (PERCOCET) 5-325 MG per tablet Take 1 tablet by mouth every 6 (six) hours as needed.      . prochlorperazine (COMPAZINE) 10 MG tablet Take 10 mg by mouth every 6 (six) hours as needed.      . propranolol (INDERAL) 80 MG tablet 80 mg daily.       . traMADol-acetaminophen (ULTRACET) 37.5-325 MG per tablet        No current facility-administered medications for this encounter.     ALLERGIES: Adhesive   LABORATORY DATA:  Lab Results  Component Value Date   WBC 6.2 10/01/2012   HGB 13.8 10/01/2012   HCT 40.9 10/01/2012   MCV 98.0 10/01/2012   PLT  207 10/01/2012   Lab Results  Component Value Date   NA 138 10/01/2012   K 4.1 10/01/2012   CL 101 10/01/2012   CO2 27 10/01/2012   Lab Results  Component Value Date   ALT 49 10/01/2012   AST 47* 10/01/2012   ALKPHOS 89 10/01/2012   BILITOT 0.42 10/01/2012     NARRATIVE: April Spence was seen today for weekly treatment management. The chart was checked and the patient's films were reviewed. The patient is doing very well. She's had a little bit of nausea and has medication for this. Otherwise no complaints.  PHYSICAL EXAMINATION: vitals were not taken for this visit.     patient is alert, sitting in a wheelchair. No acute distress.  ASSESSMENT: The patient is doing satisfactorily with treatment.  PLAN: We will continue with the patient's radiation treatment as planned.

## 2012-10-26 ENCOUNTER — Other Ambulatory Visit: Payer: Self-pay

## 2012-10-28 ENCOUNTER — Encounter: Payer: Self-pay | Admitting: Radiation Oncology

## 2012-10-28 ENCOUNTER — Ambulatory Visit: Payer: Medicare HMO | Admitting: Radiation Oncology

## 2012-10-28 ENCOUNTER — Ambulatory Visit
Admission: RE | Admit: 2012-10-28 | Discharge: 2012-10-28 | Disposition: A | Discharge status: Home / Self Care | Payer: Medicare HMO | Source: Ambulatory Visit | Attending: Radiation Oncology | Admitting: Radiation Oncology

## 2012-10-28 NOTE — Progress Notes (Addendum)
  Radiation Oncology         [phone] ________________________________  Name: PANDORA MCCRACKIN MRN: [id number]  Date: 10/28/2012  DOB: Feb 14, 1956  Stereotactic Body Radiotherapy Treatment Procedure Note  NARRATIVE:  MCKENZIE BOVE was brought to the stereotactic radiation treatment machine and placed supine on the CT couch. The patient was set up for stereotactic body radiotherapy on the body fix pillow.  3D TREATMENT PLANNING AND DOSIMETRY:  The patient's radiation plan was reviewed and approved prior to starting treatment.  It showed 3-dimensional radiation distributions overlaid onto the planning CT.  The [facility] for the target structures as well as the organs at risk were reviewed. The documentation of this is filed in the radiation oncology EMR.  SIMULATION VERIFICATION:  The patient underwent CT imaging on the treatment unit.  These were carefully aligned to document that the ablative radiation dose would cover the liver  target volume and maximally spare the nearby organs at risk according to the planned distribution.  SPECIAL TREATMENT PROCEDURE: [name] received high dose ablative stereotactic body radiotherapy to the planned target volume without unforeseen complications. Treatment was delivered uneventfully. She received her final fraction of 1800 cGy of a prescribed 5400 cGy in 3 sessions. The high doses associated with stereotactic body radiotherapy and the significant potential risks require careful treatment set up and patient monitoring constituting a special treatment procedure   STEREOTACTIC TREATMENT MANAGEMENT:  Following delivery, the patient was evaluated clinically. The patient tolerated treatment without significant acute effects, and was discharged to home in stable condition.    PLAN: Radiation therapy completed. Followup visit with Dr. Mitzi Hansen and one month.  ------------------------------------------------        Maryln Gottron, MD

## 2012-10-29 ENCOUNTER — Ambulatory Visit: Payer: Medicare HMO | Admitting: Radiation Oncology

## 2012-10-30 ENCOUNTER — Ambulatory Visit: Payer: Medicare HMO | Admitting: Radiation Oncology

## 2012-10-31 ENCOUNTER — Ambulatory Visit: Payer: Medicare HMO | Admitting: Radiation Oncology

## 2012-11-01 ENCOUNTER — Ambulatory Visit: Payer: Medicare HMO | Admitting: Radiation Oncology

## 2012-11-05 ENCOUNTER — Ambulatory Visit: Payer: Medicare HMO | Admitting: Radiation Oncology

## 2012-11-09 ENCOUNTER — Other Ambulatory Visit: Payer: Self-pay | Admitting: Oncology

## 2012-11-11 NOTE — Progress Notes (Signed)
  Radiation Oncology         [phone] ________________________________  Name: April Spence MRN: [id number]  Date: 10/28/2012  DOB: 1956-03-13  End of Treatment Note  Diagnosis:   Metastatic colon cancer with liver metastasis     Indication for treatment:  Palliative       Radiation treatment dates:   10/21/2012 through 10/28/2012  Site/dose:   The patient was treated to the region of liver metastasis to a dose of 54 gray in 3 fractions at 18 gray per fraction. This corresponded to a course of stereotactic body radiotherapy. Two rapid-arc fields were used for the patient's treatment.  Narrative: The patient tolerated radiation treatment relatively well.   The patient did not exhibit any significant difficulties with acute toxicity. No complaints at the end of treatment.  Plan: The patient has completed radiation treatment. The patient will return to radiation oncology clinic for routine followup in one month. I advised the patient to call or return sooner if they have any questions or concerns related to their recovery or treatment. ________________________________  Radene Gunning, M.D., Ph.D.

## 2012-11-11 NOTE — Addendum Note (Signed)
Encounter addended by: Jonna Coup, MD on: 11/11/2012  5:13 PM<BR>     Documentation filed: Notes Section

## 2012-11-11 NOTE — Progress Notes (Signed)
  Radiation Oncology         [phone] ________________________________  Name: April Spence MRN: [id number]  Date: 10/10/2012  DOB: 01-10-1956  RESPIRATORY MOTION MANAGEMENT SIMULATION  NARRATIVE:  In order to account for effect of respiratory motion on target structures and other organs in the planning and delivery of radiotherapy, this patient underwent respiratory motion management simulation.  To accomplish this, when the patient was brought to the CT simulation planning suite, 4D respiratoy motion management CT images were obtained.  The CT images were loaded into the planning software.  Then, using a variety of tools including Cine, MIP, and standard views, the target volume and planning target volumes (PTV) were delineated.  Avoidance structures were contoured.  Treatment planning then occurred.  Dose volume histograms were generated and reviewed for each of the requested structure.  The resulting plan was carefully reviewed and approved today.   Dorothy Puffer, MD

## 2012-11-12 ENCOUNTER — Telehealth: Payer: Self-pay | Admitting: *Deleted

## 2012-11-12 ENCOUNTER — Ambulatory Visit (HOSPITAL_BASED_OUTPATIENT_CLINIC_OR_DEPARTMENT_OTHER): Payer: Medicare HMO

## 2012-11-12 ENCOUNTER — Telehealth: Payer: Self-pay | Admitting: Oncology

## 2012-11-12 ENCOUNTER — Other Ambulatory Visit: Payer: Self-pay | Admitting: Nurse Practitioner

## 2012-11-12 ENCOUNTER — Ambulatory Visit (HOSPITAL_BASED_OUTPATIENT_CLINIC_OR_DEPARTMENT_OTHER): Payer: Medicare HMO | Admitting: Oncology

## 2012-11-12 ENCOUNTER — Other Ambulatory Visit (HOSPITAL_BASED_OUTPATIENT_CLINIC_OR_DEPARTMENT_OTHER): Payer: Medicare HMO | Admitting: Lab

## 2012-11-12 VITALS — BP 131/78 | HR 74 | Temp 97.5°F | Resp 20 | Ht 60.0 in | Wt 140.6 lb

## 2012-11-12 VITALS — BP 110/68 | HR 70

## 2012-11-12 DIAGNOSIS — C189 Malignant neoplasm of colon, unspecified: Secondary | ICD-10-CM

## 2012-11-12 DIAGNOSIS — C180 Malignant neoplasm of cecum: Secondary | ICD-10-CM

## 2012-11-12 DIAGNOSIS — C787 Secondary malignant neoplasm of liver and intrahepatic bile duct: Secondary | ICD-10-CM

## 2012-11-12 DIAGNOSIS — Z5112 Encounter for antineoplastic immunotherapy: Secondary | ICD-10-CM

## 2012-11-12 LAB — CEA: CEA: 15.5 ng/mL — ABNORMAL HIGH (ref 0.0–5.0)

## 2012-11-12 LAB — COMPREHENSIVE METABOLIC PANEL
ALT: 20 U/L (ref 0–35)
AST: 29 U/L (ref 0–37)
CO2: 25 mEq/L (ref 19–32)
Creatinine, Ser: 0.47 mg/dL — ABNORMAL LOW (ref 0.50–1.10)
Sodium: 134 mEq/L — ABNORMAL LOW (ref 135–145)
Total Bilirubin: 0.3 mg/dL (ref 0.3–1.2)
Total Protein: 7.7 g/dL (ref 6.0–8.3)

## 2012-11-12 LAB — CBC WITH DIFFERENTIAL/PLATELET
BASO%: 0.2 % (ref 0.0–2.0)
Eosinophils Absolute: 0.1 10*3/uL (ref 0.0–0.5)
LYMPH%: 13.4 % — ABNORMAL LOW (ref 14.0–49.7)
MCHC: 32.9 g/dL (ref 31.5–36.0)
MONO#: 0.8 10*3/uL (ref 0.1–0.9)
MONO%: 13.9 % (ref 0.0–14.0)
NEUT#: 4.3 10*3/uL (ref 1.5–6.5)
Platelets: 165 10*3/uL (ref 145–400)
RBC: 4.1 10*6/uL (ref 3.70–5.45)
RDW: 13.9 % (ref 11.2–14.5)
WBC: 6.1 10*3/uL (ref 3.9–10.3)

## 2012-11-12 MED ORDER — SODIUM CHLORIDE 0.9 % IV SOLN
5.0000 mg/kg | Freq: Once | INTRAVENOUS | Status: AC
  Administered 2012-11-12: 325 mg via INTRAVENOUS
  Filled 2012-11-12: qty 13

## 2012-11-12 MED ORDER — HEPARIN SOD (PORK) LOCK FLUSH 100 UNIT/ML IV SOLN
500.0000 [IU] | Freq: Once | INTRAVENOUS | Status: DC
  Filled 2012-11-12: qty 5

## 2012-11-12 MED ORDER — SODIUM CHLORIDE 0.9 % IV SOLN
Freq: Once | INTRAVENOUS | Status: AC
  Administered 2012-11-12: 20 mL via INTRAVENOUS

## 2012-11-12 MED ORDER — ONDANSETRON 8 MG/50ML IVPB (CHCC)
8.0000 mg | Freq: Once | INTRAVENOUS | Status: AC
  Administered 2012-11-12: 8 mg via INTRAVENOUS

## 2012-11-12 MED ORDER — FLUOROURACIL CHEMO INJECTION 500 MG/10ML
250.0000 mg/m2 | Freq: Once | INTRAVENOUS | Status: AC
  Administered 2012-11-12: 450 mg via INTRAVENOUS
  Filled 2012-11-12: qty 9

## 2012-11-12 MED ORDER — SODIUM CHLORIDE 0.9 % IV SOLN
1475.0000 mg/m2 | INTRAVENOUS | Status: DC
  Administered 2012-11-12: 2600 mg via INTRAVENOUS
  Filled 2012-11-12: qty 52

## 2012-11-12 MED ORDER — LEUCOVORIN CALCIUM INJECTION 350 MG
375.0000 mg/m2 | Freq: Once | INTRAVENOUS | Status: AC
  Administered 2012-11-12: 660 mg via INTRAVENOUS
  Filled 2012-11-12: qty 33

## 2012-11-12 MED ORDER — SODIUM CHLORIDE 0.9 % IJ SOLN
10.0000 mL | INTRAMUSCULAR | Status: DC | PRN
  Filled 2012-11-12: qty 10

## 2012-11-12 NOTE — Telephone Encounter (Signed)
gv and printed appt schedule for pt for March and April...emailed and s/w michelle.Marland KitchenMarland KitchenDone

## 2012-11-12 NOTE — Telephone Encounter (Signed)
Per staff phone call and POF I have schedueld appts.  JMW  

## 2012-11-12 NOTE — Progress Notes (Signed)
[facility]    OFFICE PROGRESS NOTE   INTERVAL HISTORY:   She returns as scheduled. She completed stereotactic radiation to a liver metastasis in 3 fractions between 10/21/2012 in 10/28/2012. She reports tolerating the treatment well aside from malaise.  She is currently completing a course of ciprofloxacin for a urinary tract infection. No other complaint.diarrhea has improved while off of chemotherapy.  Objective:  Vital signs in last 24 hours:  Blood pressure 131/78, pulse 74, temperature 97.5 F (36.4 C), temperature source Oral, resp. rate 20, height 5' (1.524 m), weight 140 lb 9.6 oz (63.776 kg).    HEENT: neck without mass Lymphatics: no cervical, supraclavicular, or axillary nodes Resp: decreased breath sounds with inspiratory rhonchi at the posterior base bilaterally, no respiratory distress Cardio: regular rate and rhythm GI: nontender, no hepatomegaly   Portacath/PICC-without erythema  Lab Results:  Lab Results  Component Value Date   WBC 6.1 11/12/2012   HGB 13.4 11/12/2012   HCT 40.7 11/12/2012   MCV 99.3 11/12/2012   PLT 165 11/12/2012  ANC 4.3    Medications: I have reviewed the patient's current medications.  Assessment/Plan: 1. 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 She completed 12 cycles of FOLFOX chemotherapy. Oxaliplatin was held with cycles number 5, 8, 11, and 12. 2. Metastatic colon cancer confirmed on a restaging CT evaluation 05/02/2011 with liver metastases and periportal lymphadenopathy. She began treatment with FOLFIRI on 06/01/2011. Avastin was added beginning with cycle number 2. She completed cycle #6 08/08/2011. Restaging CT evaluation 08/15/2011 showed improvement in the liver metastases and porta hepatis lymphadenopathy. She completed cycle #12 FOLFIRI/Avastin 11/07/2011. Restaging CT evaluation 11/24/2011  showed decrease in size of hepatic metastases and no evidence of disease progression or new metastasis. She completed cycle 18 on 03/11/2012. A restaging CT 03/29/2012 revealed stable disease. Irinotecan was discontinued and treatment continued with 5-FU/leucovorin and Avastin on a 3 week schedule beginning 04/02/2012. -restaging CT 08/15/2012 with slight enlargement of the central liver lesion and progression of intrahepatic biliary dilatation, no other liver lesions and no other evidence of progressive metastatic disease. CEA stable on 08/20/2012. Treatment continued with 5-FU and Avastin on a 3 week schedule.restaging CT 09/30/2012 found the dominant central right liver metastasis to be slightly larger with progressive intrahepatic biliary dilatation, a previously described left hepatic lobe lesion appeared smaller measuring 10 mm and appeared separate from the liver and is felt to most likely represent a porta hepatis lymph node.A. Suspected 9 mm metastasis was noted in the medial left hepatic lobe on an MRI 10/10/2012       -status post stereotactic radiosurgeryto the dominant liver metastasis in 3 fractions February 10 through 10/28/2012 3. Status post Port-A-Cath placement and Port-A-Cath removal by Dr. Michaell Cowing. A new Port-A-Cath was placed on 05/16/2011. The Port-A-Cath was removed due to malposition. A new Port-A-Cath was placed on 05/30/2011. 4. Spina bifida with severe scoliosis.  5. Chronic bilateral foot drop/lower leg and foot numbness. 6. Hypothyroidism. 7. History of chemotherapy-induced diarrhea. The bolus and infusional 5-FU were reduced by 25% beginning with cycle number 2 of FOLFOX. The 5-FU dose was decreased by an additional 10% beginning with cycle number 8. 8. History of delayed nausea following chemotherapy. 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 The oxaliplatin was dose reduced by 20% beginning with cycle number 6. The neuropathy symptoms resolved. 10.  History  of neutropenia secondary to chemotherapy. 11. Status post a colonoscopy 05/05/2010. There was a previous right colectomy anastomosis and slight nodularity. Biopsies were taken which showed benign colonic mucosa with hyperplastic epithelial changes. No significant inflammation, granuloma, or adenomatous epithelium was identified. 12. Diarrhea following FOLFIRI chemotherapy. Overall the diarrhea is controlled with Lomotil.  13. History of a fungal rash in the groin, improved with Diflucan and nystatin powder. 14. Mouth ulcers, oral candidiasis following cycle #6 of FOLFIRI-the ulcers resolved following treatment with Diflucan. 15. Nausea during chemotherapy-Zofran was added to the chemotherapy regimen beginning on 07/09/2012.  Disposition:  She appears stable. We discussed the indications for resuming 5-fluorouracil/Avastin. The recent staging CT and MRI revealed only minimal evidence of disease progression and the CEA was stable on 10/01/2012. We decided to resume 5-fluorouracil/Avastin on a 3 week schedule. She will return for an office visit in 6 weeks.  We will plan for a restaging MRI of the liver in 2-3 months. We will ask Dr. Mitzi Hansen to consider treating the additional areas of metastatic disease pending the MRI findings.  Thornton Papas, MD  11/12/2012  7:11 PM

## 2012-11-13 ENCOUNTER — Other Ambulatory Visit: Payer: Self-pay | Admitting: Certified Registered Nurse Anesthetist

## 2012-11-14 ENCOUNTER — Ambulatory Visit (HOSPITAL_BASED_OUTPATIENT_CLINIC_OR_DEPARTMENT_OTHER): Payer: Medicare HMO

## 2012-11-14 VITALS — BP 131/78 | HR 73 | Temp 99.0°F | Resp 20

## 2012-11-14 DIAGNOSIS — C189 Malignant neoplasm of colon, unspecified: Secondary | ICD-10-CM

## 2012-11-14 DIAGNOSIS — C180 Malignant neoplasm of cecum: Secondary | ICD-10-CM

## 2012-11-14 DIAGNOSIS — C787 Secondary malignant neoplasm of liver and intrahepatic bile duct: Secondary | ICD-10-CM

## 2012-11-14 MED ORDER — SODIUM CHLORIDE 0.9 % IJ SOLN
10.0000 mL | INTRAMUSCULAR | Status: DC | PRN
  Administered 2012-11-14: 10 mL
  Filled 2012-11-14: qty 10

## 2012-11-14 MED ORDER — HEPARIN SOD (PORK) LOCK FLUSH 100 UNIT/ML IV SOLN
500.0000 [IU] | Freq: Once | INTRAVENOUS | Status: AC
  Administered 2012-11-14: 500 [IU]
  Filled 2012-11-14: qty 5

## 2012-11-15 ENCOUNTER — Telehealth: Payer: Self-pay | Admitting: *Deleted

## 2012-11-15 NOTE — Telephone Encounter (Signed)
CALLED PATIENT TO ALTER FU VISIT FOR 11-28-12 DUE TO DR. MOODY BEING ON VACATION, RESCHEDULED FOR 12-05-12 AT 2:30 PM, LVM FOR A RETURN CALL

## 2012-11-28 ENCOUNTER — Ambulatory Visit: Payer: Medicare HMO | Admitting: Radiation Oncology

## 2012-11-29 ENCOUNTER — Telehealth: Payer: Self-pay | Admitting: *Deleted

## 2012-11-29 NOTE — Telephone Encounter (Signed)
Concerned that her CEA has increased again. Asking for MD input. Per Dr. Truett Perna: Slight increase and will check again. He wants to give a little more time between her liver radiation before making any changes in her treatment plan. She understands and agrees.

## 2012-12-01 ENCOUNTER — Other Ambulatory Visit: Payer: Self-pay | Admitting: Oncology

## 2012-12-03 ENCOUNTER — Ambulatory Visit (HOSPITAL_BASED_OUTPATIENT_CLINIC_OR_DEPARTMENT_OTHER): Payer: Medicare HMO

## 2012-12-03 ENCOUNTER — Encounter: Payer: Self-pay | Admitting: Radiation Oncology

## 2012-12-03 ENCOUNTER — Other Ambulatory Visit (HOSPITAL_BASED_OUTPATIENT_CLINIC_OR_DEPARTMENT_OTHER): Payer: Medicare HMO | Admitting: Lab

## 2012-12-03 VITALS — BP 132/69 | HR 66 | Temp 97.9°F | Resp 16

## 2012-12-03 DIAGNOSIS — Z452 Encounter for adjustment and management of vascular access device: Secondary | ICD-10-CM

## 2012-12-03 DIAGNOSIS — C189 Malignant neoplasm of colon, unspecified: Secondary | ICD-10-CM

## 2012-12-03 DIAGNOSIS — Z5111 Encounter for antineoplastic chemotherapy: Secondary | ICD-10-CM

## 2012-12-03 DIAGNOSIS — C180 Malignant neoplasm of cecum: Secondary | ICD-10-CM

## 2012-12-03 DIAGNOSIS — Z5112 Encounter for antineoplastic immunotherapy: Secondary | ICD-10-CM

## 2012-12-03 LAB — CBC WITH DIFFERENTIAL/PLATELET
Basophils Absolute: 0 10*3/uL (ref 0.0–0.1)
EOS%: 3.1 % (ref 0.0–7.0)
Eosinophils Absolute: 0.1 10*3/uL (ref 0.0–0.5)
HCT: 37.6 % (ref 34.8–46.6)
HGB: 12.9 g/dL (ref 11.6–15.9)
LYMPH%: 13.3 % — ABNORMAL LOW (ref 14.0–49.7)
MCH: 33.6 pg (ref 25.1–34.0)
MCV: 98.1 fL (ref 79.5–101.0)
MONO%: 10.4 % (ref 0.0–14.0)
NEUT#: 2.8 10*3/uL (ref 1.5–6.5)
NEUT%: 72.8 % (ref 38.4–76.8)
Platelets: 138 10*3/uL — ABNORMAL LOW (ref 145–400)

## 2012-12-03 MED ORDER — ONDANSETRON 8 MG/50ML IVPB (CHCC)
8.0000 mg | Freq: Once | INTRAVENOUS | Status: AC
  Administered 2012-12-03: 8 mg via INTRAVENOUS

## 2012-12-03 MED ORDER — FLUOROURACIL CHEMO INJECTION 500 MG/10ML
250.0000 mg/m2 | Freq: Once | INTRAVENOUS | Status: AC
  Administered 2012-12-03: 450 mg via INTRAVENOUS
  Filled 2012-12-03: qty 9

## 2012-12-03 MED ORDER — SODIUM CHLORIDE 0.9 % IV SOLN
1475.0000 mg/m2 | INTRAVENOUS | Status: DC
  Administered 2012-12-03: 2600 mg via INTRAVENOUS
  Filled 2012-12-03: qty 52

## 2012-12-03 MED ORDER — SODIUM CHLORIDE 0.9 % IV SOLN
5.0000 mg/kg | Freq: Once | INTRAVENOUS | Status: AC
  Administered 2012-12-03: 325 mg via INTRAVENOUS
  Filled 2012-12-03: qty 13

## 2012-12-03 MED ORDER — SODIUM CHLORIDE 0.9 % IV SOLN
Freq: Once | INTRAVENOUS | Status: AC
  Administered 2012-12-03: 11:00:00 via INTRAVENOUS

## 2012-12-03 MED ORDER — ALTEPLASE 2 MG IJ SOLR
2.0000 mg | Freq: Once | INTRAMUSCULAR | Status: AC | PRN
  Administered 2012-12-03: 2 mg
  Filled 2012-12-03: qty 2

## 2012-12-03 MED ORDER — LEUCOVORIN CALCIUM INJECTION 350 MG
375.0000 mg/m2 | Freq: Once | INTRAVENOUS | Status: AC
  Administered 2012-12-03: 660 mg via INTRAVENOUS
  Filled 2012-12-03: qty 33

## 2012-12-03 NOTE — Patient Instructions (Signed)
[facility] Discharge Instructions for Patients Receiving Chemotherapy  Today you received the following chemotherapy agents Avastin, Leucovorin, and 5FU.  To help prevent nausea and vomiting after your treatment, we encourage you to take your nausea medication.   If you develop nausea and vomiting that is not controlled by your nausea medication, call the clinic. If it is after clinic hours your family physician or the after hours number for the clinic or go to the Emergency Department.   BELOW ARE SYMPTOMS THAT SHOULD BE REPORTED IMMEDIATELY:  *FEVER GREATER THAN 100.5 F  *CHILLS WITH OR WITHOUT FEVER  NAUSEA AND VOMITING THAT IS NOT CONTROLLED WITH YOUR NAUSEA MEDICATION  *UNUSUAL SHORTNESS OF BREATH  *UNUSUAL BRUISING OR BLEEDING  TENDERNESS IN MOUTH AND THROAT WITH OR WITHOUT PRESENCE OF ULCERS  *URINARY PROBLEMS  *BOWEL PROBLEMS  UNUSUAL RASH Items with * indicate a potential emergency and should be followed up as soon as possible.  One of the nurses will contact you 24 hours after your treatment. Please let the nurse know about any problems that you may have experienced. Feel free to call the clinic you have any questions or concerns. The clinic phone number is [phone].   I have been informed and understand all the instructions given to me. I know to contact the clinic, my physician, or go to the Emergency Department if any problems should occur. I do not have any questions at this time, but understand that I may call the clinic during office hours   should I have any questions or need assistance in obtaining follow up care.    __________________________________________  _____________  __________ Signature of Patient or Authorized Representative            Date                   Time    __________________________________________ Nurse's Signature

## 2012-12-04 ENCOUNTER — Telehealth: Payer: Self-pay | Admitting: *Deleted

## 2012-12-04 NOTE — Telephone Encounter (Signed)
Pt's husband notified of CEA result normal.  Stated he would let pt know and expressed appreciation for call.

## 2012-12-04 NOTE — Telephone Encounter (Signed)
Message copied by Gala Romney on Wed Dec 04, 2012  4:08 PM ------      Message from: Ladene Artist      Created: Tue Dec 03, 2012 10:27 PM       Please call patient, cea is now normal ------

## 2012-12-05 ENCOUNTER — Ambulatory Visit
Admission: RE | Admit: 2012-12-05 | Discharge: 2012-12-05 | Disposition: A | Discharge status: Home / Self Care | Payer: Medicare HMO | Source: Ambulatory Visit | Attending: Radiation Oncology | Admitting: Radiation Oncology

## 2012-12-05 ENCOUNTER — Ambulatory Visit (HOSPITAL_BASED_OUTPATIENT_CLINIC_OR_DEPARTMENT_OTHER): Payer: Medicare HMO

## 2012-12-05 VITALS — BP 120/73 | HR 75 | Temp 98.1°F

## 2012-12-05 VITALS — BP 111/73 | HR 71 | Temp 98.2°F

## 2012-12-05 DIAGNOSIS — C180 Malignant neoplasm of cecum: Secondary | ICD-10-CM

## 2012-12-05 DIAGNOSIS — C189 Malignant neoplasm of colon, unspecified: Secondary | ICD-10-CM

## 2012-12-05 DIAGNOSIS — C787 Secondary malignant neoplasm of liver and intrahepatic bile duct: Secondary | ICD-10-CM

## 2012-12-05 MED ORDER — SODIUM CHLORIDE 0.9 % IJ SOLN
10.0000 mL | INTRAMUSCULAR | Status: DC | PRN
  Administered 2012-12-05: 10 mL
  Filled 2012-12-05: qty 10

## 2012-12-05 MED ORDER — HEPARIN SOD (PORK) LOCK FLUSH 100 UNIT/ML IV SOLN
500.0000 [IU] | Freq: Once | INTRAVENOUS | Status: AC
  Administered 2012-12-05: 500 [IU]
  Filled 2012-12-05: qty 5

## 2012-12-05 NOTE — Progress Notes (Signed)
Radiation Oncology         [phone] ________________________________  Name: April Spence MRN: [id number]  Date: 12/05/2012  DOB: 12-Oct-1955  Follow-Up Visit Note  CC: Nadean Corwin, MD  Ladene Artist, MD  Diagnosis:   Metastatic colon cancer with liver metastasis  Interval Since Last Radiation:  5 weeks   Narrative:  The patient returns today for routine follow-up.  The patient states that she has done fairly well. She did complain of some fatigue at the end of treatment but this resolved after a couple of weeks. She is continuing with chemotherapy. She does have some occasional nausea but she states that this is well controlled with her current nausea medications. She denies any abdominal pain or skin reaction in the treatment area. The patient is very pleased because her CEA level is now normal with her last labs.                              ALLERGIES:  is allergic to adhesive.  Meds: Current Outpatient Prescriptions  Medication Sig Dispense Refill  . Alum & Mag Hydroxide-Simeth (MAGIC MOUTHWASH) SOLN Take 10 mLs by mouth 4 (four) times daily as needed. Swish and spit  240 mL  2  . amitriptyline (ELAVIL) 25 MG tablet Take 25 mg by mouth daily.       . Cholecalciferol (VITAMIN D PO) Take 1 tablet by mouth daily.       . Cyanocobalamin (VITAMIN B-12 IJ) Inject as directed every 14 (fourteen) days.       . diphenoxylate-atropine (LOMOTIL) 2.5-0.025 MG per tablet ONE TO TWO TABS FOUR TIMES A DAY PRN.  120 tablet  3  . fenofibrate micronized (LOFIBRA) 134 MG capsule Take 134 mg by mouth daily.      Marland Kitchen FLUoxetine (PROZAC) 20 MG capsule Take 20 mg by mouth daily.       Marland Kitchen levothyroxine (SYNTHROID) 150 MCG tablet Take 1 tablet (150 mcg total) by mouth daily.      Marland Kitchen lidocaine-prilocaine (EMLA) cream Apply topically as needed. APPLY TO PORTACATH SITE 1-2 HOURS PRIOR TO USE  30 g  5  . LORazepam (ATIVAN) 0.5 MG tablet Take 1 tablet (0.5 mg total) by mouth every 12 (twelve)  hours as needed for anxiety.  30 tablet  2  . nystatin (NYSTOP) 100000 UNIT/GM POWD Apply topically as needed.      Marland Kitchen oxyCODONE-acetaminophen (PERCOCET) 5-325 MG per tablet Take 1 tablet by mouth every 6 (six) hours as needed.      . prochlorperazine (COMPAZINE) 10 MG tablet Take 10 mg by mouth every 6 (six) hours as needed.      . propranolol (INDERAL) 80 MG tablet 80 mg daily.       . traMADol-acetaminophen (ULTRACET) 37.5-325 MG per tablet        No current facility-administered medications for this encounter.   Facility-Administered Medications Ordered in Other Encounters  Medication Dose Route Frequency Provider Last Rate Last Dose  . fluorouracil (ADRUCIL) 2,600 mg in sodium chloride 0.9 % 150 mL chemo infusion  1,475 mg/m2 (Treatment Plan Actual) Intravenous 1 day or 1 dose Ladene Artist, MD   2,600 mg at 11/12/12 1603  . heparin lock flush 100 unit/mL  500 Units Intracatheter Once Ladene Artist, MD      . sodium chloride 0.9 % injection 10 mL  10 mL Intracatheter PRN Ladene Artist, MD  Physical Findings: The patient is in no acute distress. Patient is alert and oriented.  temperature is 98.2 F (36.8 C). Her blood pressure is 111/73 and her pulse is 71. Her oxygen saturation is 98%. .     Lab Findings: Lab Results  Component Value Date   WBC 3.9 12/03/2012   HGB 12.9 12/03/2012   HCT 37.6 12/03/2012   MCV 98.1 12/03/2012   PLT 138* 12/03/2012     Radiographic Findings: No results found.  Impression:    The patient is doing well clinically and has not had any evidence of any ongoing difficulties related to treatment. She is continuing with chemotherapy and will have a repeat scan in a couple of months which I think would be a reasonable time to recheck from an imaging perspective. We will see if there is any role for any additional palliative radiotherapy.  Plan:  The patient will return to clinic in 3 months for ongoing followup.   Radene Gunning, M.D.,  Ph.D.

## 2012-12-05 NOTE — Progress Notes (Signed)
Routine one month follow up completion of radiation to abdomen for liver metastasis.Nausea relieved with zofran and compazine.Continues with chemo regime every 3 weeks to include 5-fu, leucovorin and avastin.Generalized fatigue but states she feels somewhat better.

## 2012-12-22 ENCOUNTER — Other Ambulatory Visit: Payer: Self-pay | Admitting: Oncology

## 2012-12-24 ENCOUNTER — Ambulatory Visit (HOSPITAL_BASED_OUTPATIENT_CLINIC_OR_DEPARTMENT_OTHER): Payer: Medicare HMO | Admitting: Oncology

## 2012-12-24 ENCOUNTER — Ambulatory Visit (HOSPITAL_BASED_OUTPATIENT_CLINIC_OR_DEPARTMENT_OTHER): Payer: Medicare HMO

## 2012-12-24 ENCOUNTER — Other Ambulatory Visit (HOSPITAL_BASED_OUTPATIENT_CLINIC_OR_DEPARTMENT_OTHER): Payer: Medicare HMO | Admitting: Lab

## 2012-12-24 ENCOUNTER — Telehealth: Payer: Self-pay | Admitting: Oncology

## 2012-12-24 VITALS — BP 117/69 | HR 64

## 2012-12-24 VITALS — BP 122/80 | HR 72 | Temp 98.0°F | Resp 20 | Ht 60.0 in | Wt 138.2 lb

## 2012-12-24 DIAGNOSIS — C189 Malignant neoplasm of colon, unspecified: Secondary | ICD-10-CM

## 2012-12-24 DIAGNOSIS — C180 Malignant neoplasm of cecum: Secondary | ICD-10-CM

## 2012-12-24 DIAGNOSIS — Z5112 Encounter for antineoplastic immunotherapy: Secondary | ICD-10-CM

## 2012-12-24 DIAGNOSIS — Z5111 Encounter for antineoplastic chemotherapy: Secondary | ICD-10-CM

## 2012-12-24 DIAGNOSIS — C787 Secondary malignant neoplasm of liver and intrahepatic bile duct: Secondary | ICD-10-CM

## 2012-12-24 LAB — CBC WITH DIFFERENTIAL/PLATELET
EOS%: 1.7 % (ref 0.0–7.0)
Eosinophils Absolute: 0.1 10*3/uL (ref 0.0–0.5)
LYMPH%: 20.5 % (ref 14.0–49.7)
MCH: 32.8 pg (ref 25.1–34.0)
MCHC: 33.2 g/dL (ref 31.5–36.0)
MCV: 98.9 fL (ref 79.5–101.0)
MONO%: 15.5 % — ABNORMAL HIGH (ref 0.0–14.0)
NEUT#: 2.7 10*3/uL (ref 1.5–6.5)
Platelets: 167 10*3/uL (ref 145–400)
RBC: 4.12 10*6/uL (ref 3.70–5.45)

## 2012-12-24 MED ORDER — ONDANSETRON 8 MG/50ML IVPB (CHCC)
8.0000 mg | Freq: Once | INTRAVENOUS | Status: AC
  Administered 2012-12-24: 8 mg via INTRAVENOUS

## 2012-12-24 MED ORDER — FLUOROURACIL CHEMO INJECTION 500 MG/10ML
250.0000 mg/m2 | Freq: Once | INTRAVENOUS | Status: AC
  Administered 2012-12-24: 450 mg via INTRAVENOUS
  Filled 2012-12-24: qty 9

## 2012-12-24 MED ORDER — SODIUM CHLORIDE 0.9 % IV SOLN
1475.0000 mg/m2 | INTRAVENOUS | Status: DC
  Administered 2012-12-24: 2600 mg via INTRAVENOUS
  Filled 2012-12-24: qty 52

## 2012-12-24 MED ORDER — SODIUM CHLORIDE 0.9 % IV SOLN
5.0000 mg/kg | Freq: Once | INTRAVENOUS | Status: AC
  Administered 2012-12-24: 325 mg via INTRAVENOUS
  Filled 2012-12-24: qty 13

## 2012-12-24 MED ORDER — SODIUM CHLORIDE 0.9 % IV SOLN
Freq: Once | INTRAVENOUS | Status: AC
  Administered 2012-12-24: 10:00:00 via INTRAVENOUS

## 2012-12-24 MED ORDER — LEUCOVORIN CALCIUM INJECTION 350 MG
375.0000 mg/m2 | Freq: Once | INTRAVENOUS | Status: AC
  Administered 2012-12-24: 660 mg via INTRAVENOUS
  Filled 2012-12-24: qty 33

## 2012-12-24 NOTE — Telephone Encounter (Signed)
gv and printed appt sched. and avs for pt..sw michelle added tx.Marland KitchenMarland KitchenMarland Kitchen

## 2012-12-24 NOTE — Patient Instructions (Addendum)
[facility] Discharge Instructions for Patients Receiving Chemotherapy  Today you received the following chemotherapy agents:  Avastin, 5-FU and Leucovorin.  To help prevent nausea and vomiting after your treatment, we encourage you to take your nausea medication as ordered per MD.    If you develop nausea and vomiting that is not controlled by your nausea medication, call the clinic. If it is after clinic hours your family physician or the after hours number for the clinic or go to the Emergency Department.   BELOW ARE SYMPTOMS THAT SHOULD BE REPORTED IMMEDIATELY:  *FEVER GREATER THAN 100.5 F  *CHILLS WITH OR WITHOUT FEVER  NAUSEA AND VOMITING THAT IS NOT CONTROLLED WITH YOUR NAUSEA MEDICATION  *UNUSUAL SHORTNESS OF BREATH  *UNUSUAL BRUISING OR BLEEDING  TENDERNESS IN MOUTH AND THROAT WITH OR WITHOUT PRESENCE OF ULCERS  *URINARY PROBLEMS  *BOWEL PROBLEMS  UNUSUAL RASH Items with * indicate a potential emergency and should be followed up as soon as possible.   Please let the nurse know about any problems that you may have experienced. Feel free to call the clinic you have any questions or concerns. The clinic phone number is [phone].   I have been informed and understand all the instructions given to me. I know to contact the clinic, my physician, or go to the Emergency Department if any problems should occur. I do not have any questions at this time, but understand that I may call the clinic during office hours   should I have any questions or need assistance in obtaining follow up care.    __________________________________________  _____________  __________ Signature of Patient or Authorized Representative            Date                   Time    __________________________________________ Nurse's Signature

## 2012-12-24 NOTE — Progress Notes (Signed)
[facility]    OFFICE PROGRESS NOTE   INTERVAL HISTORY:   She returns as scheduled. She completed another cycle of 5-FU and Avastin on 12/03/2012. She had mouth soreness following chemotherapy. Diarrhea is generally controlled with Lomotil. No bleeding aside from minor nose bleeding.  Objective:  Vital signs in last 24 hours:  Blood pressure 122/80, pulse 72, temperature 98 F (36.7 C), temperature source Oral, resp. rate 20, height 5' (1.524 m), weight 138 lb 3.2 oz (62.687 kg).    HEENT: no thrush or ulcers Resp: inspiratory rales at the right base Cardio: regular rate and rhythm GI: no hepatomegaly Vascular: no leg edema  Portacath/PICC-without erythema  Lab Results:  Lab Results  Component Value Date   WBC 4.3 12/24/2012   HGB 13.5 12/24/2012   HCT 40.8 12/24/2012   MCV 98.9 12/24/2012   PLT 167 12/24/2012  ANC 2.7  CEA on 12/03/2012-3.8  Medications: I have reviewed the patient's current medications.  Assessment/Plan: 1. 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 She completed 12 cycles of FOLFOX chemotherapy. Oxaliplatin was held with cycles number 5, 8, 11, and 12. 2. Metastatic colon cancer confirmed on a restaging CT evaluation 05/02/2011 with liver metastases and periportal lymphadenopathy. She began treatment with FOLFIRI on 06/01/2011. Avastin was added beginning with cycle number 2. She completed cycle #6 08/08/2011. Restaging CT evaluation 08/15/2011 showed improvement in the liver metastases and porta hepatis lymphadenopathy. She completed cycle #12 FOLFIRI/Avastin 11/07/2011. Restaging CT evaluation 11/24/2011 showed decrease in size of hepatic metastases and no evidence of disease progression or new metastasis. She completed cycle 18 on 03/11/2012. A restaging CT 03/29/2012 revealed stable disease. Irinotecan was discontinued  and treatment continued with 5-FU/leucovorin and Avastin on a 3 week schedule beginning 04/02/2012. -restaging CT 08/15/2012 with slight enlargement of the central liver lesion and progression of intrahepatic biliary dilatation, no other liver lesions and no other evidence of progressive metastatic disease. CEA stable on 08/20/2012. Treatment continued with 5-FU and Avastin on a 3 week schedule.restaging CT 09/30/2012 found the dominant central right liver metastasis to be slightly larger with progressive intrahepatic biliary dilatation, a previously described left hepatic lobe lesion appeared smaller measuring 10 mm and appeared separate from the liver and is felt to most likely represent a porta hepatis lymph node.A. Suspected 9 mm metastasis was noted in the medial left hepatic lobe on an MRI 10/10/2012 -status post stereotactic radiosurgeryto the dominant liver metastasis in 3 fractions February 10 through 10/28/2012. 5-FU/Avastin was resumed on a 3 week schedule 12/03/2012. 3. Status post Port-A-Cath placement and Port-A-Cath removal by Dr. Michaell Cowing. A new Port-A-Cath was placed on 05/16/2011. The Port-A-Cath was removed due to malposition. A new Port-A-Cath was placed on 05/30/2011. 4. Spina bifida with severe scoliosis.  5. Chronic bilateral foot drop/lower leg and foot numbness. 6. Hypothyroidism. 7. History of chemotherapy-induced diarrhea. The bolus and infusional 5-FU were reduced by 25% beginning with cycle number 2 of FOLFOX. The 5-FU dose was decreased by an additional 10% beginning with cycle number 8. 8. History of delayed nausea following chemotherapy. 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 The oxaliplatin was dose reduced by 20% beginning with cycle number 6. The neuropathy symptoms resolved. 10. History of neutropenia secondary to chemotherapy. 11. Status post a colonoscopy 05/05/2010. There was a previous right colectomy anastomosis and slight nodularity. Biopsies  were  taken which showed benign colonic mucosa with hyperplastic epithelial changes. No significant inflammation, granuloma, or adenomatous epithelium was identified. 12. Diarrhea following FOLFIRI chemotherapy. Overall the diarrhea is controlled with Lomotil.  13. History of a fungal rash in the groin, improved with Diflucan and nystatin powder. 14. Mouth ulcers, oral candidiasis following cycle #6 of FOLFIRI-the ulcers resolved following treatment with Diflucan. 15. Nausea during chemotherapy-Zofran was added to the chemotherapy regimen beginning on 07/09/2012.   Disposition:  She appears stable. The CEA was lower on 12/03/2012,? Response to radiation. The plan is to continue 5-FU/Avastin every 3 weeks. We will plan for a restaging MRI of the liver within the next 2-3 months.She will try Oragel if she develops mouth ulcers following chemotherapy.   Thornton Papas, MD  12/24/2012  8:44 PM

## 2012-12-26 ENCOUNTER — Ambulatory Visit (HOSPITAL_BASED_OUTPATIENT_CLINIC_OR_DEPARTMENT_OTHER): Payer: Medicare HMO

## 2012-12-26 VITALS — BP 105/73 | HR 74 | Temp 98.5°F

## 2012-12-26 DIAGNOSIS — C189 Malignant neoplasm of colon, unspecified: Secondary | ICD-10-CM

## 2012-12-26 MED ORDER — SODIUM CHLORIDE 0.9 % IJ SOLN
10.0000 mL | INTRAMUSCULAR | Status: DC | PRN
  Administered 2012-12-26: 10 mL
  Filled 2012-12-26: qty 10

## 2012-12-26 MED ORDER — HEPARIN SOD (PORK) LOCK FLUSH 100 UNIT/ML IV SOLN
500.0000 [IU] | Freq: Once | INTRAVENOUS | Status: AC
  Administered 2012-12-26: 500 [IU]
  Filled 2012-12-26: qty 5

## 2012-12-26 NOTE — Progress Notes (Signed)
Unable to get blood return with pump dc.  Patient states her port is difficult to get a blood return. Patient has mild nausea this am.

## 2012-12-26 NOTE — Patient Instructions (Signed)
Call MD for problems or concerns 

## 2013-01-02 ENCOUNTER — Other Ambulatory Visit: Payer: Self-pay | Admitting: *Deleted

## 2013-01-02 MED ORDER — ONDANSETRON HCL 8 MG PO TABS: 8.0000 mg | ORAL_TABLET | Freq: Two times a day (BID) | ORAL | Status: DC | PRN

## 2013-01-13 ENCOUNTER — Other Ambulatory Visit: Payer: Self-pay | Admitting: Oncology

## 2013-01-14 ENCOUNTER — Other Ambulatory Visit (HOSPITAL_BASED_OUTPATIENT_CLINIC_OR_DEPARTMENT_OTHER): Payer: Medicare HMO

## 2013-01-14 ENCOUNTER — Ambulatory Visit (HOSPITAL_BASED_OUTPATIENT_CLINIC_OR_DEPARTMENT_OTHER): Payer: Medicare HMO

## 2013-01-14 VITALS — BP 118/73 | HR 68 | Temp 98.4°F

## 2013-01-14 DIAGNOSIS — C787 Secondary malignant neoplasm of liver and intrahepatic bile duct: Secondary | ICD-10-CM

## 2013-01-14 DIAGNOSIS — C189 Malignant neoplasm of colon, unspecified: Secondary | ICD-10-CM

## 2013-01-14 DIAGNOSIS — C180 Malignant neoplasm of cecum: Secondary | ICD-10-CM

## 2013-01-14 DIAGNOSIS — Z5112 Encounter for antineoplastic immunotherapy: Secondary | ICD-10-CM

## 2013-01-14 DIAGNOSIS — Z5111 Encounter for antineoplastic chemotherapy: Secondary | ICD-10-CM

## 2013-01-14 LAB — CBC WITH DIFFERENTIAL/PLATELET
Basophils Absolute: 0 10*3/uL (ref 0.0–0.1)
EOS%: 2 % (ref 0.0–7.0)
Eosinophils Absolute: 0.1 10*3/uL (ref 0.0–0.5)
HCT: 43.5 % (ref 34.8–46.6)
HGB: 14.4 g/dL (ref 11.6–15.9)
MCH: 32.6 pg (ref 25.1–34.0)
MCV: 98.8 fL (ref 79.5–101.0)
NEUT#: 2.8 10*3/uL (ref 1.5–6.5)
NEUT%: 66.9 % (ref 38.4–76.8)
lymph#: 0.6 10*3/uL — ABNORMAL LOW (ref 0.9–3.3)

## 2013-01-14 LAB — COMPREHENSIVE METABOLIC PANEL (CC13)
Albumin: 3 g/dL — ABNORMAL LOW (ref 3.5–5.0)
Alkaline Phosphatase: 89 U/L (ref 40–150)
BUN: 12.7 mg/dL (ref 7.0–26.0)
CO2: 26 mEq/L (ref 22–29)
Calcium: 9.8 mg/dL (ref 8.4–10.4)
Glucose: 112 mg/dl — ABNORMAL HIGH (ref 70–99)
Potassium: 4.1 mEq/L (ref 3.5–5.1)
Total Protein: 7.8 g/dL (ref 6.4–8.3)

## 2013-01-14 LAB — UA PROTEIN, DIPSTICK - CHCC: Protein, ur: NEGATIVE mg/dL

## 2013-01-14 MED ORDER — LEUCOVORIN CALCIUM INJECTION 350 MG
375.0000 mg/m2 | Freq: Once | INTRAVENOUS | Status: AC
  Administered 2013-01-14: 660 mg via INTRAVENOUS
  Filled 2013-01-14: qty 33

## 2013-01-14 MED ORDER — SODIUM CHLORIDE 0.9 % IV SOLN
5.0000 mg/kg | Freq: Once | INTRAVENOUS | Status: AC
  Administered 2013-01-14: 325 mg via INTRAVENOUS
  Filled 2013-01-14: qty 13

## 2013-01-14 MED ORDER — SODIUM CHLORIDE 0.9 % IV SOLN
1475.0000 mg/m2 | INTRAVENOUS | Status: DC
  Administered 2013-01-14: 2600 mg via INTRAVENOUS
  Filled 2013-01-14: qty 52

## 2013-01-14 MED ORDER — ONDANSETRON 8 MG/50ML IVPB (CHCC)
8.0000 mg | Freq: Once | INTRAVENOUS | Status: AC
  Administered 2013-01-14: 8 mg via INTRAVENOUS

## 2013-01-14 MED ORDER — SODIUM CHLORIDE 0.9 % IV SOLN
Freq: Once | INTRAVENOUS | Status: AC
  Administered 2013-01-14: 10:00:00 via INTRAVENOUS

## 2013-01-14 MED ORDER — FLUOROURACIL CHEMO INJECTION 500 MG/10ML
250.0000 mg/m2 | Freq: Once | INTRAVENOUS | Status: AC
  Administered 2013-01-14: 450 mg via INTRAVENOUS
  Filled 2013-01-14: qty 9

## 2013-01-14 NOTE — Patient Instructions (Addendum)
[facility] Discharge Instructions for Patients Receiving Chemotherapy  Today you received the following chemotherapy agents Avastin, 5 FU and Leucovorin  To help prevent nausea and vomiting after your treatment, we encourage you to take your nausea medication.   If you develop nausea and vomiting that is not controlled by your nausea medication, call the clinic. If it is after clinic hours your family physician or the after hours number for the clinic or go to the Emergency Department.   BELOW ARE SYMPTOMS THAT SHOULD BE REPORTED IMMEDIATELY:  *FEVER GREATER THAN 100.5 F  *CHILLS WITH OR WITHOUT FEVER  NAUSEA AND VOMITING THAT IS NOT CONTROLLED WITH YOUR NAUSEA MEDICATION  *UNUSUAL SHORTNESS OF BREATH  *UNUSUAL BRUISING OR BLEEDING  TENDERNESS IN MOUTH AND THROAT WITH OR WITHOUT PRESENCE OF ULCERS  *URINARY PROBLEMS  *BOWEL PROBLEMS  UNUSUAL RASH Items with * indicate a potential emergency and should be followed up as soon as possible.  One of the nurses will contact you 24 hours after your treatment. Please let the nurse know about any problems that you may have experienced. Feel free to call the clinic you have any questions or concerns. The clinic phone number is [phone].   I have been informed and understand all the instructions given to me. I know to contact the clinic, my physician, or go to the Emergency Department if any problems should occur. I do not have any questions at this time, but understand that I may call the clinic during office hours   should I have any questions or need assistance in obtaining follow up care.    __________________________________________  _____________  __________ Signature of Patient or Authorized Representative            Date                   Time    __________________________________________ Nurse's Signature

## 2013-01-16 ENCOUNTER — Ambulatory Visit (HOSPITAL_BASED_OUTPATIENT_CLINIC_OR_DEPARTMENT_OTHER): Payer: Medicare HMO

## 2013-01-16 VITALS — BP 117/79 | HR 70 | Temp 98.3°F

## 2013-01-16 DIAGNOSIS — C787 Secondary malignant neoplasm of liver and intrahepatic bile duct: Secondary | ICD-10-CM

## 2013-01-16 DIAGNOSIS — C189 Malignant neoplasm of colon, unspecified: Secondary | ICD-10-CM

## 2013-01-16 DIAGNOSIS — C180 Malignant neoplasm of cecum: Secondary | ICD-10-CM

## 2013-01-16 MED ORDER — SODIUM CHLORIDE 0.9 % IJ SOLN
10.0000 mL | INTRAMUSCULAR | Status: DC | PRN
  Administered 2013-01-16: 10 mL
  Filled 2013-01-16: qty 10

## 2013-01-16 MED ORDER — HEPARIN SOD (PORK) LOCK FLUSH 100 UNIT/ML IV SOLN
500.0000 [IU] | Freq: Once | INTRAVENOUS | Status: AC
  Administered 2013-01-16: 500 [IU]
  Filled 2013-01-16: qty 5

## 2013-01-16 NOTE — Patient Instructions (Signed)
Call MD for problems or concerns 

## 2013-01-16 NOTE — Progress Notes (Signed)
Unable to get blood return from port with pump dc.  Not unusual for this patient

## 2013-02-02 ENCOUNTER — Other Ambulatory Visit: Payer: Self-pay | Admitting: Oncology

## 2013-02-04 ENCOUNTER — Telehealth: Payer: Self-pay | Admitting: Oncology

## 2013-02-04 ENCOUNTER — Ambulatory Visit (HOSPITAL_BASED_OUTPATIENT_CLINIC_OR_DEPARTMENT_OTHER): Payer: Medicare HMO | Admitting: Nurse Practitioner

## 2013-02-04 ENCOUNTER — Other Ambulatory Visit (HOSPITAL_BASED_OUTPATIENT_CLINIC_OR_DEPARTMENT_OTHER): Payer: Medicare HMO | Admitting: Lab

## 2013-02-04 ENCOUNTER — Telehealth: Payer: Self-pay | Admitting: *Deleted

## 2013-02-04 ENCOUNTER — Ambulatory Visit (HOSPITAL_BASED_OUTPATIENT_CLINIC_OR_DEPARTMENT_OTHER): Payer: Medicare HMO

## 2013-02-04 VITALS — BP 112/82 | HR 75 | Temp 97.8°F | Resp 18 | Ht 60.0 in | Wt 135.8 lb

## 2013-02-04 DIAGNOSIS — C189 Malignant neoplasm of colon, unspecified: Secondary | ICD-10-CM

## 2013-02-04 DIAGNOSIS — Z5111 Encounter for antineoplastic chemotherapy: Secondary | ICD-10-CM

## 2013-02-04 DIAGNOSIS — C180 Malignant neoplasm of cecum: Secondary | ICD-10-CM

## 2013-02-04 DIAGNOSIS — C787 Secondary malignant neoplasm of liver and intrahepatic bile duct: Secondary | ICD-10-CM

## 2013-02-04 DIAGNOSIS — Z5112 Encounter for antineoplastic immunotherapy: Secondary | ICD-10-CM

## 2013-02-04 DIAGNOSIS — R197 Diarrhea, unspecified: Secondary | ICD-10-CM

## 2013-02-04 DIAGNOSIS — C187 Malignant neoplasm of sigmoid colon: Secondary | ICD-10-CM

## 2013-02-04 LAB — CBC WITH DIFFERENTIAL/PLATELET
Basophils Absolute: 0 10*3/uL (ref 0.0–0.1)
EOS%: 1.2 % (ref 0.0–7.0)
Eosinophils Absolute: 0 10*3/uL (ref 0.0–0.5)
HCT: 45 % (ref 34.8–46.6)
HGB: 15 g/dL (ref 11.6–15.9)
MCH: 32.8 pg (ref 25.1–34.0)
NEUT#: 2.1 10*3/uL (ref 1.5–6.5)
NEUT%: 55.2 % (ref 38.4–76.8)
RDW: 15 % — ABNORMAL HIGH (ref 11.2–14.5)
lymph#: 0.6 10*3/uL — ABNORMAL LOW (ref 0.9–3.3)

## 2013-02-04 LAB — UA PROTEIN, DIPSTICK - CHCC: Protein, ur: NEGATIVE mg/dL

## 2013-02-04 MED ORDER — SODIUM CHLORIDE 0.9 % IV SOLN
Freq: Once | INTRAVENOUS | Status: AC
  Administered 2013-02-04: 12:00:00 via INTRAVENOUS

## 2013-02-04 MED ORDER — FLUOROURACIL CHEMO INJECTION 500 MG/10ML
250.0000 mg/m2 | Freq: Once | INTRAVENOUS | Status: AC
  Administered 2013-02-04: 450 mg via INTRAVENOUS
  Filled 2013-02-04: qty 9

## 2013-02-04 MED ORDER — SODIUM CHLORIDE 0.9 % IV SOLN
5.0000 mg/kg | Freq: Once | INTRAVENOUS | Status: AC
  Administered 2013-02-04: 325 mg via INTRAVENOUS
  Filled 2013-02-04: qty 13

## 2013-02-04 MED ORDER — LEUCOVORIN CALCIUM INJECTION 350 MG
375.0000 mg/m2 | Freq: Once | INTRAVENOUS | Status: AC
  Administered 2013-02-04: 660 mg via INTRAVENOUS
  Filled 2013-02-04: qty 33

## 2013-02-04 MED ORDER — SODIUM CHLORIDE 0.9 % IV SOLN
1475.0000 mg/m2 | INTRAVENOUS | Status: DC
  Administered 2013-02-04: 2600 mg via INTRAVENOUS
  Filled 2013-02-04: qty 52

## 2013-02-04 MED ORDER — ONDANSETRON 8 MG/50ML IVPB (CHCC)
8.0000 mg | Freq: Once | INTRAVENOUS | Status: AC
  Administered 2013-02-04: 8 mg via INTRAVENOUS

## 2013-02-04 NOTE — Telephone Encounter (Signed)
Gave pt appt for june 2014 lab, MD and chemo

## 2013-02-04 NOTE — Progress Notes (Signed)
OFFICE PROGRESS NOTE  Interval history:  April Spence returns as scheduled. She was last treated with 5 fluorouracil and Avastin 01/14/2013. She had mild nausea. She again developed mouth sores similar to previous cycles. She has intermittent loose stools which are controlled with Lomotil. No bleeding except minor nosebleeds. No shortness of breath or chest pain. Occasional abdominal pain which she relates to "gas".   Objective: Blood pressure 112/82, pulse 75, temperature 97.8 F (36.6 C), temperature source Oral, resp. rate 18, height 5' (1.524 m), weight 135 lb 12.8 oz (61.598 kg).  No thrush or ulcerations. Lungs clear. Regular cardiac rhythm. Port-A-Cath site without erythema. Abdomen soft. No hepatomegaly. Extremities without edema.  Lab Results: Lab Results  Component Value Date   WBC 3.9 02/04/2013   HGB 15.0 02/04/2013   HCT 45.0 02/04/2013   MCV 98.1 02/04/2013   PLT 202 02/04/2013    Chemistry:    Chemistry      Component Value Date/Time   NA 138 01/14/2013 0929   NA 134* 11/12/2012 1317   NA 136 05/02/2011 0954   K 4.1 01/14/2013 0929   K 4.0 11/12/2012 1317   K 4.5 05/02/2011 0954   CL 101 01/14/2013 0929   CL 102 11/12/2012 1317   CL 93* 05/02/2011 0954   CO2 26 01/14/2013 0929   CO2 25 11/12/2012 1317   CO2 29 05/02/2011 0954   BUN 12.7 01/14/2013 0929   BUN 12 11/12/2012 1317   BUN 12 05/02/2011 0954   CREATININE 0.6 01/14/2013 0929   CREATININE 0.47* 11/12/2012 1317   CREATININE 0.5* 05/02/2011 0954      Component Value Date/Time   CALCIUM 9.8 01/14/2013 0929   CALCIUM 9.7 11/12/2012 1317   CALCIUM 9.3 05/02/2011 0954   ALKPHOS 89 01/14/2013 0929   ALKPHOS 68 11/12/2012 1317   ALKPHOS 78 05/02/2011 0954   AST 39* 01/14/2013 0929   AST 29 11/12/2012 1317   AST 31 05/02/2011 0954   ALT 22 01/14/2013 0929   ALT 20 11/12/2012 1317   BILITOT 0.52 01/14/2013 0929   BILITOT 0.3 11/12/2012 1317   BILITOT 0.40 05/02/2011 0954       Studies/Results: No results found.  Medications: I have reviewed the  patient's current medications.  Assessment/Plan:  1. 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 She completed 12 cycles of FOLFOX chemotherapy. Oxaliplatin was held with cycles number 5, 8, 11, and 12. 2. Metastatic colon cancer confirmed on a restaging CT evaluation 05/02/2011 with liver metastases and periportal lymphadenopathy. She began treatment with FOLFIRI on 06/01/2011. Avastin was added beginning with cycle number 2. She completed cycle #6 08/08/2011. Restaging CT evaluation 08/15/2011 showed improvement in the liver metastases and porta hepatis lymphadenopathy. She completed cycle #12 FOLFIRI/Avastin 11/07/2011. Restaging CT evaluation 11/24/2011 showed decrease in size of hepatic metastases and no evidence of disease progression or new metastasis. She completed cycle 18 on 03/11/2012. A restaging CT 03/29/2012 revealed stable disease. Irinotecan was discontinued and treatment continued with 5-FU/leucovorin and Avastin on a 3 week schedule beginning 04/02/2012. -restaging CT 08/15/2012 with slight enlargement of the central liver lesion and progression of intrahepatic biliary dilatation, no other liver lesions and no other evidence of progressive metastatic disease. CEA stable on 08/20/2012. Treatment continued with 5-FU and Avastin on a 3 week schedule.restaging CT 09/30/2012 found the dominant central right liver metastasis to be slightly  larger with progressive intrahepatic biliary dilatation, a previously described left hepatic lobe lesion appeared smaller measuring 10 mm and appeared separate from the liver and is felt to most likely represent a porta hepatis lymph node.A. Suspected 9 mm metastasis was noted in the medial left hepatic lobe on an MRI 10/10/2012 -status post stereotactic radiosurgeryto the dominant liver metastasis in 3 fractions February 10 through 10/28/2012.  5-FU/Avastin was resumed on a 3 week schedule 12/03/2012. CEA in normal range on 12/03/2012 and 01/14/2013. 3. Status post Port-A-Cath placement and Port-A-Cath removal by Dr. Michaell Cowing. A new Port-A-Cath was placed on 05/16/2011. The Port-A-Cath was removed due to malposition. A new Port-A-Cath was placed on 05/30/2011. 4. Spina bifida with severe scoliosis.  5. Chronic bilateral foot drop/lower leg and foot numbness. 6. Hypothyroidism. 7. History of chemotherapy-induced diarrhea. The bolus and infusional 5-FU were reduced by 25% beginning with cycle number 2 of FOLFOX. The 5-FU dose was decreased by an additional 10% beginning with cycle number 8. 8. History of delayed nausea following chemotherapy. 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 The oxaliplatin was dose reduced by 20% beginning with cycle number 6. The neuropathy symptoms resolved. 10. History of neutropenia secondary to chemotherapy. 11. Status post a colonoscopy 05/05/2010. There was a previous right colectomy anastomosis and slight nodularity. Biopsies were taken which showed benign colonic mucosa with hyperplastic epithelial changes. No significant inflammation, granuloma, or adenomatous epithelium was identified. 12. Diarrhea following FOLFIRI chemotherapy. Overall the diarrhea is controlled with Lomotil.  13. History of a fungal rash in the groin, improved with Diflucan and nystatin powder. 14. Mouth ulcers, oral candidiasis following cycle #6 of FOLFIRI-the ulcers resolved following treatment with Diflucan. She continues to note mild mouth ulcers following each treatment. 15. Nausea during chemotherapy-Zofran was added to the chemotherapy regimen beginning on 07/09/2012.  Disposition-April Spence appears stable. Plan to proceed with 5 fluorouracil/Avastin today as scheduled. We will obtain a restaging MRI of the liver prior to the next treatment. She will return for a followup visit in 3 weeks. She will contact the  office in the interim with any problems.  Plan reviewed with Dr. Truett Perna.  April Spence ANP/GNP-BC

## 2013-02-04 NOTE — Telephone Encounter (Signed)
Per staff phone call and POF I have schedueld appts.  JMW  

## 2013-02-04 NOTE — Patient Instructions (Addendum)
[facility] Discharge Instructions for Patients Receiving Chemotherapy  Today you received the following chemotherapy agents Avastin/Leucovorin/5FU.  To help prevent nausea and vomiting after your treatment, we encourage you to take your nausea medication as prescribed.   If you develop nausea and vomiting that is not controlled by your nausea medication, call the clinic. If it is after clinic hours your family physician or the after hours number for the clinic or go to the Emergency Department.   BELOW ARE SYMPTOMS THAT SHOULD BE REPORTED IMMEDIATELY:  *FEVER GREATER THAN 100.5 F  *CHILLS WITH OR WITHOUT FEVER  NAUSEA AND VOMITING THAT IS NOT CONTROLLED WITH YOUR NAUSEA MEDICATION  *UNUSUAL SHORTNESS OF BREATH  *UNUSUAL BRUISING OR BLEEDING  TENDERNESS IN MOUTH AND THROAT WITH OR WITHOUT PRESENCE OF ULCERS  *URINARY PROBLEMS  *BOWEL PROBLEMS  UNUSUAL RASH Items with * indicate a potential emergency and should be followed up as soon as possible.  Feel free to call the clinic you have any questions or concerns. The clinic phone number is [phone].   I have been informed and understand all the instructions given to me. I know to contact the clinic, my physician, or go to the Emergency Department if any problems should occur. I do not have any questions at this time, but understand that I may call the clinic during office hours   should I have any questions or need assistance in obtaining follow up care.    __________________________________________  _____________  __________ Signature of Patient or Authorized Representative            Date                   Time    __________________________________________ Nurse's Signature

## 2013-02-06 ENCOUNTER — Ambulatory Visit (HOSPITAL_BASED_OUTPATIENT_CLINIC_OR_DEPARTMENT_OTHER): Payer: Medicare HMO

## 2013-02-06 VITALS — BP 121/82 | HR 71 | Temp 98.1°F | Resp 18

## 2013-02-06 DIAGNOSIS — C180 Malignant neoplasm of cecum: Secondary | ICD-10-CM

## 2013-02-06 DIAGNOSIS — C189 Malignant neoplasm of colon, unspecified: Secondary | ICD-10-CM

## 2013-02-06 DIAGNOSIS — Z452 Encounter for adjustment and management of vascular access device: Secondary | ICD-10-CM

## 2013-02-06 MED ORDER — HEPARIN SOD (PORK) LOCK FLUSH 100 UNIT/ML IV SOLN
500.0000 [IU] | Freq: Once | INTRAVENOUS | Status: AC
  Administered 2013-02-06: 500 [IU]
  Filled 2013-02-06: qty 5

## 2013-02-06 MED ORDER — SODIUM CHLORIDE 0.9 % IJ SOLN
10.0000 mL | INTRAMUSCULAR | Status: DC | PRN
  Administered 2013-02-06: 10 mL
  Filled 2013-02-06: qty 10

## 2013-02-06 NOTE — Patient Instructions (Signed)
Call MD for problems or concerns 

## 2013-02-21 ENCOUNTER — Other Ambulatory Visit: Payer: Self-pay | Admitting: Nurse Practitioner

## 2013-02-21 ENCOUNTER — Ambulatory Visit (HOSPITAL_COMMUNITY)
Admission: RE | Admit: 2013-02-21 | Discharge: 2013-02-21 | Disposition: A | Discharge status: Home / Self Care | Payer: Medicare HMO | Source: Ambulatory Visit | Attending: Nurse Practitioner | Admitting: Nurse Practitioner

## 2013-02-21 DIAGNOSIS — C787 Secondary malignant neoplasm of liver and intrahepatic bile duct: Secondary | ICD-10-CM | POA: Insufficient documentation

## 2013-02-21 DIAGNOSIS — C189 Malignant neoplasm of colon, unspecified: Secondary | ICD-10-CM

## 2013-02-21 MED ORDER — GADOBENATE DIMEGLUMINE 529 MG/ML IV SOLN
15.0000 mL | Freq: Once | INTRAVENOUS | Status: AC | PRN
  Administered 2013-02-21: 13 mL via INTRAVENOUS

## 2013-02-22 ENCOUNTER — Other Ambulatory Visit: Payer: Self-pay | Admitting: Oncology

## 2013-02-25 ENCOUNTER — Ambulatory Visit: Payer: Medicare HMO

## 2013-02-25 ENCOUNTER — Telehealth: Payer: Self-pay | Admitting: Oncology

## 2013-02-25 ENCOUNTER — Telehealth: Payer: Self-pay | Admitting: Gastroenterology

## 2013-02-25 ENCOUNTER — Ambulatory Visit (HOSPITAL_BASED_OUTPATIENT_CLINIC_OR_DEPARTMENT_OTHER): Payer: Medicare HMO | Admitting: Oncology

## 2013-02-25 ENCOUNTER — Other Ambulatory Visit (HOSPITAL_BASED_OUTPATIENT_CLINIC_OR_DEPARTMENT_OTHER): Payer: Medicare HMO | Admitting: Lab

## 2013-02-25 VITALS — BP 130/94 | HR 80 | Temp 98.0°F | Resp 18 | Ht 60.0 in | Wt 136.1 lb

## 2013-02-25 DIAGNOSIS — R933 Abnormal findings on diagnostic imaging of other parts of digestive tract: Secondary | ICD-10-CM

## 2013-02-25 DIAGNOSIS — C189 Malignant neoplasm of colon, unspecified: Secondary | ICD-10-CM

## 2013-02-25 LAB — COMPREHENSIVE METABOLIC PANEL (CC13)
Albumin: 2.1 g/dL — ABNORMAL LOW (ref 3.5–5.0)
BUN: 8.5 mg/dL (ref 7.0–26.0)
CO2: 24 mEq/L (ref 22–29)
Calcium: 10 mg/dL (ref 8.4–10.4)
Chloride: 99 mEq/L (ref 98–107)
Glucose: 210 mg/dl — ABNORMAL HIGH (ref 70–99)
Potassium: 3.8 mEq/L (ref 3.5–5.1)
Sodium: 133 mEq/L — ABNORMAL LOW (ref 136–145)
Total Protein: 7.7 g/dL (ref 6.4–8.3)

## 2013-02-25 LAB — CBC WITH DIFFERENTIAL/PLATELET
BASO%: 0.2 % (ref 0.0–2.0)
EOS%: 1.4 % (ref 0.0–7.0)
MCH: 32.4 pg (ref 25.1–34.0)
MCHC: 32.9 g/dL (ref 31.5–36.0)
MCV: 98.6 fL (ref 79.5–101.0)
MONO%: 17.8 % — ABNORMAL HIGH (ref 0.0–14.0)
RBC: 4.26 10*6/uL (ref 3.70–5.45)
RDW: 18.8 % — ABNORMAL HIGH (ref 11.2–14.5)
lymph#: 0.7 10*3/uL — ABNORMAL LOW (ref 0.9–3.3)
nRBC: 0 % (ref 0–0)

## 2013-02-25 MED ORDER — LIDOCAINE-PRILOCAINE 2.5-2.5 % EX CREA
TOPICAL_CREAM | Freq: Once | CUTANEOUS | Status: AC
  Administered 2013-02-25: 12:00:00 via TOPICAL

## 2013-02-25 NOTE — Telephone Encounter (Signed)
April Spence, She needs ERCP this Thursday +++MAC, for jaundice, biliary obstruction, likely stent placement.    Brad, I looked at films, labs.  Definitely worth a try with ERCP first.  We'll get it set up for this Thursday.  Thanks

## 2013-02-25 NOTE — Telephone Encounter (Signed)
Gave pt appt for Julty 2014 lab,MD and chemo

## 2013-02-25 NOTE — Progress Notes (Signed)
 [facility]    OFFICE PROGRESS NOTE   INTERVAL HISTORY:   April Spence returns as scheduled. She completed another cycle of 5-FU and Avastin on 02/04/2013. She noted increased mouth soreness and bleeding at the gums following this cycle. She complains of malaise. She has intermittent pain in the right abdomen. She has chronic back pain.  April Spence noted dark urine beginning several weeks ago and jaundice over the past few days. No pruritus. She has also noted increased diarrhea and light-colored stools.  Objective:  Vital signs in last 24 hours:  Blood pressure 130/94, pulse 80, temperature 98 F (36.7 C), temperature source Oral, resp. rate 18, height 5' (1.524 m), weight 136 lb 1.6 oz (61.735 kg).    HEENT: Scleral and subungual icterus. Poor dentition with oozong from the lower anterior gum line after examination with a tongue blade Resp: Decreased breath sounds with inspiratory rhonchi at the right greater than left base, no respiratory distress Cardio: Regular rate and rhythm GI: No hepatomegaly, nontender, no mass Vascular: No leg edema  Skin: Jaundice   Portacath/PICC-without erythema  Lab Results:  Lab Results  Component Value Date   WBC 4.4 02/25/2013   HGB 13.8 02/25/2013   HCT 42.0 02/25/2013   MCV 98.6 02/25/2013   PLT 198 02/25/2013   ANC 2.9  Alkaline phosphatase 286, bilirubin 6.8, AST 80, ALT 38, BUN 8.5, creatinine 0.6  X-rays: MRI of the abdomen on 02/21/2013, compared to 10/10/2012-the right hepatic lobe metastasis has decreased in size. The lesion in the medial left hepatic lobe has not changed in size now measuring 1 cm, but there is now involvement of the central left hepatic lobe bile duct resulting in left hepatic lobe ductal dilatation. Stable caliber of the common bile duct. I reviewed the MRI with April Spence and her husband.  Medications: I have reviewed the patient's current medications.  Assessment/Plan: 1. 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 She completed 12 cycles of FOLFOX chemotherapy. Oxaliplatin was held with cycles number 5, 8, 11, and 12. 2. Metastatic colon cancer confirmed on a restaging CT evaluation 05/02/2011 with liver metastases and periportal lymphadenopathy. She began treatment with FOLFIRI on 06/01/2011. Avastin was added beginning with cycle number 2. She completed cycle #6 08/08/2011. Restaging CT evaluation 08/15/2011 showed improvement in the liver metastases and porta hepatis lymphadenopathy. She completed cycle #12 FOLFIRI/Avastin 11/07/2011. Restaging CT evaluation 11/24/2011 showed decrease in size of hepatic metastases and no evidence of disease progression or new metastasis. She completed cycle 18 on 03/11/2012. A restaging CT 03/29/2012 revealed stable disease. Irinotecan was discontinued and treatment continued with 5-FU/leucovorin and Avastin on a 3 week schedule beginning 04/02/2012. -restaging CT 08/15/2012 with slight enlargement of the central liver lesion and progression of intrahepatic biliary dilatation, no other liver lesions and no other evidence of progressive metastatic disease. CEA stable on 08/20/2012. Treatment continued with 5-FU and Avastin on a 3 week schedule.restaging CT 09/30/2012 found the dominant central right liver metastasis to be slightly larger with progressive intrahepatic biliary dilatation, a previously described left hepatic lobe lesion appeared smaller measuring 10 mm and appeared separate from the liver and is felt to most likely represent a porta hepatis lymph node.A. Suspected 9 mm metastasis was noted in the medial left hepatic lobe on an MRI 10/10/2012 -status post stereotactic radiosurgeryto the dominant liver metastasis in   3 fractions February 10 through 10/28/2012. 5-FU/Avastin was resumed on a 3 week schedule 12/03/2012. CEA in normal range  on 12/03/2012 and 01/14/2013. Restaging MRI of the liver 02/21/2013 with a decrease in the dominant right hepatic lesion, stable medial left hepatic lobe lesion-but the left lesion is now causing obstruction of the left hepatic bile ducts 3. Status post Port-A-Cath placement and Port-A-Cath removal by Dr. Gross. A new Port-A-Cath was placed on 05/16/2011. The Port-A-Cath was removed due to malposition. A new Port-A-Cath was placed on 05/30/2011. 4. Spina bifida with severe scoliosis.  5. Chronic bilateral foot drop/lower leg and foot numbness. 6. Hypothyroidism. 7. History of chemotherapy-induced diarrhea. The bolus and infusional 5-FU were reduced by 25% beginning with cycle number 2 of FOLFOX. The 5-FU dose was decreased by an additional 10% beginning with cycle number 8. 8. History of delayed nausea following chemotherapy. 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 The oxaliplatin was dose reduced by 20% beginning with cycle number 6. The neuropathy symptoms resolved. 10. History of neutropenia secondary to chemotherapy. 11. Status post a colonoscopy 05/05/2010. There was a previous right colectomy anastomosis and slight nodularity. Biopsies were taken which showed benign colonic mucosa with hyperplastic epithelial changes. No significant inflammation, granuloma, or adenomatous epithelium was identified. 12. Diarrhea following FOLFIRI chemotherapy. Overall the diarrhea is controlled with Lomotil.  13. History of a fungal rash in the groin, improved with Diflucan and nystatin powder. 14. Mouth ulcers, oral candidiasis following cycle #6 of FOLFIRI-the ulcers resolved following treatment with Diflucan. She continues to note mild mouth ulcers following each treatment. 15. Nausea during chemotherapy-Zofran was added to the chemotherapy regimen beginning on 07/09/2012. 16. Oozing at the gumline secondary to periodontal disease and Avastin 17. Jaundice secondary to biliary  obstruction  Disposition:  She has developed jaundice. This appears to be secondary to an obstructing metastasis in the left liver. She knows to contact us for a fever or chills. We will ask Dr. Jacobs to review the MRI and consider an ERCP/stent placement. We held the 5-FU and Avastin today. She will return for an office visit in 3 weeks. We will check a CEA when she returns in 3 weeks.   Tamee Battin, MD  02/25/2013  12:50 PM    

## 2013-02-26 ENCOUNTER — Ambulatory Visit: Payer: Medicare HMO | Admitting: Radiation Oncology

## 2013-02-26 ENCOUNTER — Encounter (HOSPITAL_COMMUNITY): Payer: Self-pay | Admitting: *Deleted

## 2013-02-26 ENCOUNTER — Encounter (HOSPITAL_COMMUNITY): Payer: Self-pay | Admitting: Pharmacy Technician

## 2013-02-26 ENCOUNTER — Other Ambulatory Visit: Payer: Self-pay

## 2013-02-26 ENCOUNTER — Encounter: Payer: Self-pay | Admitting: Gastroenterology

## 2013-02-26 DIAGNOSIS — R933 Abnormal findings on diagnostic imaging of other parts of digestive tract: Secondary | ICD-10-CM

## 2013-02-26 DIAGNOSIS — I499 Cardiac arrhythmia, unspecified: Secondary | ICD-10-CM

## 2013-02-26 HISTORY — DX: Cardiac arrhythmia, unspecified: I49.9

## 2013-02-26 NOTE — Telephone Encounter (Signed)
Pt has been instructed and meds reviewed pt will call with any questions or concerns 

## 2013-02-26 NOTE — Telephone Encounter (Signed)
Pt has been scheduled for ERCP 02/27/13 1230 pm

## 2013-02-27 ENCOUNTER — Encounter (HOSPITAL_COMMUNITY)
Admission: RE | Disposition: A | Discharge status: Home / Self Care | Payer: Self-pay | Source: Ambulatory Visit | Attending: Gastroenterology

## 2013-02-27 ENCOUNTER — Ambulatory Visit (HOSPITAL_COMMUNITY)
Admission: RE | Admit: 2013-02-27 | Discharge: 2013-02-27 | Disposition: A | Discharge status: Home / Self Care | Payer: Medicare HMO | Source: Ambulatory Visit | Attending: Gastroenterology | Admitting: Gastroenterology

## 2013-02-27 ENCOUNTER — Ambulatory Visit (HOSPITAL_COMMUNITY): Payer: Medicare HMO

## 2013-02-27 ENCOUNTER — Ambulatory Visit (HOSPITAL_COMMUNITY): Payer: Medicare HMO | Admitting: Anesthesiology

## 2013-02-27 ENCOUNTER — Encounter (HOSPITAL_COMMUNITY): Payer: Self-pay

## 2013-02-27 ENCOUNTER — Encounter (HOSPITAL_COMMUNITY): Payer: Self-pay | Admitting: Anesthesiology

## 2013-02-27 ENCOUNTER — Telehealth: Payer: Self-pay

## 2013-02-27 DIAGNOSIS — M549 Dorsalgia, unspecified: Secondary | ICD-10-CM | POA: Insufficient documentation

## 2013-02-27 DIAGNOSIS — R197 Diarrhea, unspecified: Secondary | ICD-10-CM | POA: Insufficient documentation

## 2013-02-27 DIAGNOSIS — M4120 Other idiopathic scoliosis, site unspecified: Secondary | ICD-10-CM | POA: Insufficient documentation

## 2013-02-27 DIAGNOSIS — R5383 Other fatigue: Secondary | ICD-10-CM | POA: Insufficient documentation

## 2013-02-27 DIAGNOSIS — K5730 Diverticulosis of large intestine without perforation or abscess without bleeding: Secondary | ICD-10-CM | POA: Insufficient documentation

## 2013-02-27 DIAGNOSIS — R17 Unspecified jaundice: Secondary | ICD-10-CM | POA: Insufficient documentation

## 2013-02-27 DIAGNOSIS — Q059 Spina bifida, unspecified: Secondary | ICD-10-CM | POA: Insufficient documentation

## 2013-02-27 DIAGNOSIS — R932 Abnormal findings on diagnostic imaging of liver and biliary tract: Secondary | ICD-10-CM

## 2013-02-27 DIAGNOSIS — K831 Obstruction of bile duct: Secondary | ICD-10-CM | POA: Insufficient documentation

## 2013-02-27 DIAGNOSIS — C787 Secondary malignant neoplasm of liver and intrahepatic bile duct: Secondary | ICD-10-CM | POA: Insufficient documentation

## 2013-02-27 DIAGNOSIS — R933 Abnormal findings on diagnostic imaging of other parts of digestive tract: Secondary | ICD-10-CM

## 2013-02-27 DIAGNOSIS — G8929 Other chronic pain: Secondary | ICD-10-CM | POA: Insufficient documentation

## 2013-02-27 DIAGNOSIS — E039 Hypothyroidism, unspecified: Secondary | ICD-10-CM | POA: Insufficient documentation

## 2013-02-27 DIAGNOSIS — C189 Malignant neoplasm of colon, unspecified: Secondary | ICD-10-CM | POA: Insufficient documentation

## 2013-02-27 DIAGNOSIS — R5381 Other malaise: Secondary | ICD-10-CM | POA: Insufficient documentation

## 2013-02-27 DIAGNOSIS — K838 Other specified diseases of biliary tract: Secondary | ICD-10-CM | POA: Insufficient documentation

## 2013-02-27 HISTORY — PX: ERCP: SHX5425

## 2013-02-27 HISTORY — PX: BILIARY STENT PLACEMENT: SHX5538

## 2013-02-27 LAB — HEPATIC FUNCTION PANEL
ALT: 36 U/L — ABNORMAL HIGH (ref 0–35)
AST: 81 U/L — ABNORMAL HIGH (ref 0–37)
Bilirubin, Direct: 5.5 mg/dL — ABNORMAL HIGH (ref 0.0–0.3)
Total Bilirubin: 7.4 mg/dL — ABNORMAL HIGH (ref 0.3–1.2)

## 2013-02-27 SURGERY — ERCP, WITH INTERVENTION IF INDICATED
Anesthesia: General

## 2013-02-27 MED ORDER — FENTANYL CITRATE 0.05 MG/ML IJ SOLN
INTRAMUSCULAR | Status: DC | PRN
  Administered 2013-02-27 (×2): 50 ug via INTRAVENOUS

## 2013-02-27 MED ORDER — CIPROFLOXACIN HCL 500 MG PO TABS: 500.0000 mg | ORAL_TABLET | Freq: Two times a day (BID) | ORAL | Status: DC

## 2013-02-27 MED ORDER — MEPERIDINE HCL 100 MG/ML IJ SOLN: 6.2500 mg | INTRAMUSCULAR | Status: DC | PRN

## 2013-02-27 MED ORDER — FENTANYL CITRATE 0.05 MG/ML IJ SOLN
INTRAMUSCULAR | Status: AC
  Filled 2013-02-27: qty 2

## 2013-02-27 MED ORDER — CIPROFLOXACIN IN D5W 400 MG/200ML IV SOLN
INTRAVENOUS | Status: AC
  Filled 2013-02-27: qty 200

## 2013-02-27 MED ORDER — MIDAZOLAM HCL 5 MG/5ML IJ SOLN
INTRAMUSCULAR | Status: DC | PRN
  Administered 2013-02-27 (×2): 1 mg via INTRAVENOUS

## 2013-02-27 MED ORDER — LACTATED RINGERS IV SOLN
INTRAVENOUS | Status: DC
  Administered 2013-02-27: 12:00:00 via INTRAVENOUS

## 2013-02-27 MED ORDER — GLUCAGON HCL (RDNA) 1 MG IJ SOLR
INTRAMUSCULAR | Status: AC
  Filled 2013-02-27: qty 2

## 2013-02-27 MED ORDER — CIPROFLOXACIN IN D5W 400 MG/200ML IV SOLN
INTRAVENOUS | Status: DC | PRN
  Administered 2013-02-27: 400 mg via INTRAVENOUS

## 2013-02-27 MED ORDER — SODIUM CHLORIDE 0.9 % IV SOLN: INTRAVENOUS | Status: DC

## 2013-02-27 MED ORDER — PROMETHAZINE HCL 25 MG/ML IJ SOLN
INTRAMUSCULAR | Status: AC
  Filled 2013-02-27: qty 1

## 2013-02-27 MED ORDER — PROPOFOL 10 MG/ML IV BOLUS
INTRAVENOUS | Status: DC | PRN
  Administered 2013-02-27: 140 mg via INTRAVENOUS

## 2013-02-27 MED ORDER — SODIUM CHLORIDE 0.9 % IV SOLN
INTRAVENOUS | Status: DC | PRN
  Administered 2013-02-27: 13:00:00

## 2013-02-27 MED ORDER — CIPROFLOXACIN IN D5W 400 MG/200ML IV SOLN
400.0000 mg | Freq: Two times a day (BID) | INTRAVENOUS | Status: DC
  Administered 2013-02-27: 400 mg via INTRAVENOUS

## 2013-02-27 MED ORDER — SUCCINYLCHOLINE CHLORIDE 20 MG/ML IJ SOLN
INTRAMUSCULAR | Status: DC | PRN
  Administered 2013-02-27: 100 mg via INTRAVENOUS

## 2013-02-27 MED ORDER — FENTANYL CITRATE 0.05 MG/ML IJ SOLN
25.0000 ug | Freq: Once | INTRAMUSCULAR | Status: AC
  Administered 2013-02-27: 25 ug via INTRAVENOUS

## 2013-02-27 MED ORDER — PROMETHAZINE HCL 25 MG/ML IJ SOLN: 6.2500 mg | INTRAMUSCULAR | Status: DC | PRN

## 2013-02-27 MED ORDER — LACTATED RINGERS IV SOLN: INTRAVENOUS | Status: DC

## 2013-02-27 NOTE — Anesthesia Preprocedure Evaluation (Addendum)
Anesthesia Evaluation  Patient identified by MRN, date of birth, ID band Patient awake    Reviewed: Allergy & Precautions, H&P , NPO status , Patient's Chart, lab work & pertinent test results  Airway Mallampati: II TM Distance: >3 FB Neck ROM: Full    Dental no notable dental hx. (+) Teeth Intact   Pulmonary neg pulmonary ROS, former smoker,  breath sounds clear to auscultation  Pulmonary exam normal       Cardiovascular negative cardio ROS  + dysrhythmias - Valvular Problems/MurmursRhythm:Regular Rate:Normal     Neuro/Psych PSYCHIATRIC DISORDERS Depression negative neurological ROS     GI/Hepatic negative GI ROS, Neg liver ROS,   Endo/Other  negative endocrine ROSHypothyroidism   Renal/GU negative Renal ROS  negative genitourinary   Musculoskeletal negative musculoskeletal ROS (+)   Abdominal   Peds negative pediatric ROS (+)  Hematology negative hematology ROS (+)   Anesthesia Other Findings Upper front caps  Reproductive/Obstetrics negative OB ROS                          Anesthesia Physical Anesthesia Plan  ASA: II  Anesthesia Plan: General   Post-op Pain Management:    Induction: Intravenous  Airway Management Planned: Oral ETT  Additional Equipment:   Intra-op Plan:   Post-operative Plan: Extubation in OR  Informed Consent: I have reviewed the patients History and Physical, chart, labs and discussed the procedure including the risks, benefits and alternatives for the proposed anesthesia with the patient or authorized representative who has indicated his/her understanding and acceptance.   Dental advisory given  Plan Discussed with: CRNA  Anesthesia Plan Comments:         Anesthesia Quick Evaluation

## 2013-02-27 NOTE — Telephone Encounter (Signed)
Message copied by Donata Duff on Thu Feb 27, 2013  1:39 PM ------      Message from: Rob Bunting P      Created: Thu Feb 27, 2013  1:22 PM       April Spence,            She needs cmet in one week. thankd             ------

## 2013-02-27 NOTE — Op Note (Signed)
[facility] [address] [place], [address]   ERCP PROCEDURE REPORT  PATIENT: April Spence, April Spence.  MR# :[id number] BIRTHDATE: 10-18-1955  GENDER: Female ENDOSCOPIST: Rachael Fee, MD REFERRED BY: Mardelle Matte, M.D. PROCEDURE DATE:  02/27/2013 PROCEDURE:   ERCP with stent placement ASA CLASS:   Class III INDICATIONS:known metastatic colon cancer, masses in liver; recent MRI shows dilated left intrahepatic ducts with obstructing mass in liver, new jaundice. MEDICATIONS: General endotracheal anesthesia (GETA) and Cipro 400 mg IV TOPICAL ANESTHETIC: none  DESCRIPTION OF PROCEDURE:   After the risks benefits and alternatives of the procedure were thoroughly explained, informed consent was obtained.  The Pentax ERCP [id number]  endoscope was introduced through the mouth  and advanced to the second portion of the duodenum without detailed examination of the UGI tract. There were two peripampullary diverticulum  that did not interfere with biliary cannulation. A 44 Autotome over a .035 hydrawire was used to cannulate the biliary tree and contrast was injected.  The cystic duct stump opacified.  There was some initial confusion about her biliary anatomy due to her position on the table, severe scoliosis, anatomically abnormal liver.  I called interventional radiologist into the room for his opinion and we defined the anatomy to both our satisfaction.  There was a tight stricture in left intrahepatic duct creating dilated hepatic ducts proximal to the stricture. The stricture lenght was about 2cm and it required balloon dilation with an 8mm diameter, 4cm long dilator in order to accept the metal stent.  A 6cm long, 10mm diameter fully UNcovered metal biliary stent was used to bridge the stricture in good position. The proximal end of the stent was deep into left side intrahepatic duct and the distal end was in the CBD.    The scope was then completely withdrawn from  the patient and the procedure terminated.  The main pancreatic duct was never cannulated or injected with dye.     COMPLICATIONS: There were no complications.  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  RECOMMENDATIONS: Follow clinically.  Her T bili today was 7.4.  My office will arrange repeat set of LFTs in 1 week.  She will complete 3 days of twice daily cipro, empirically.   _______________________________ eSignedRachael Fee, MD 02/27/2013 1:20 PM

## 2013-02-27 NOTE — H&P (View-Only) (Signed)
[facility]    OFFICE PROGRESS NOTE   INTERVAL HISTORY:   [name] returns as scheduled. She completed another cycle of 5-FU and Avastin on 02/04/2013. She noted increased mouth soreness and bleeding at the gums following this cycle. She complains of malaise. She has intermittent pain in the right abdomen. She has chronic back pain.  [name] noted dark urine beginning several weeks ago and jaundice over the past few days. No pruritus. She has also noted increased diarrhea and light-colored stools.  Objective:  Vital signs in last 24 hours:  Blood pressure 130/94, pulse 80, temperature 98 F (36.7 C), temperature source Oral, resp. rate 18, height 5' (1.524 m), weight 136 lb 1.6 oz (61.735 kg).    HEENT: Scleral and subungual icterus. Poor dentition with oozong from the lower anterior gum line after examination with a tongue blade Resp: Decreased breath sounds with inspiratory rhonchi at the right greater than left base, no respiratory distress Cardio: Regular rate and rhythm GI: No hepatomegaly, nontender, no mass Vascular: No leg edema  Skin: Jaundice   Portacath/PICC-without erythema  Lab Results:  Lab Results  Component Value Date   WBC 4.4 02/25/2013   HGB 13.8 02/25/2013   HCT 42.0 02/25/2013   MCV 98.6 02/25/2013   PLT 198 02/25/2013   ANC 2.9  Alkaline phosphatase 286, bilirubin 6.8, AST 80, ALT 38, BUN 8.5, creatinine 0.6  X-rays: MRI of the abdomen on 02/21/2013, compared to 10/10/2012-the right hepatic lobe metastasis has decreased in size. The lesion in the medial left hepatic lobe has not changed in size now measuring 1 cm, but there is now involvement of the central left hepatic lobe bile duct resulting in left hepatic lobe ductal dilatation. Stable caliber of the common bile duct. I reviewed the MRI with Ms. Hentz and her husband.  Medications: I have reviewed the patient's current medications.  Assessment/Plan: 1. 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 She completed 12 cycles of FOLFOX chemotherapy. Oxaliplatin was held with cycles number 5, 8, 11, and 12. 2. Metastatic colon cancer confirmed on a restaging CT evaluation 05/02/2011 with liver metastases and periportal lymphadenopathy. She began treatment with FOLFIRI on 06/01/2011. Avastin was added beginning with cycle number 2. She completed cycle #6 08/08/2011. Restaging CT evaluation 08/15/2011 showed improvement in the liver metastases and porta hepatis lymphadenopathy. She completed cycle #12 FOLFIRI/Avastin 11/07/2011. Restaging CT evaluation 11/24/2011 showed decrease in size of hepatic metastases and no evidence of disease progression or new metastasis. She completed cycle 18 on 03/11/2012. A restaging CT 03/29/2012 revealed stable disease. Irinotecan was discontinued and treatment continued with 5-FU/leucovorin and Avastin on a 3 week schedule beginning 04/02/2012. -restaging CT 08/15/2012 with slight enlargement of the central liver lesion and progression of intrahepatic biliary dilatation, no other liver lesions and no other evidence of progressive metastatic disease. CEA stable on 08/20/2012. Treatment continued with 5-FU and Avastin on a 3 week schedule.restaging CT 09/30/2012 found the dominant central right liver metastasis to be slightly larger with progressive intrahepatic biliary dilatation, a previously described left hepatic lobe lesion appeared smaller measuring 10 mm and appeared separate from the liver and is felt to most likely represent a porta hepatis lymph node.A. Suspected 9 mm metastasis was noted in the medial left hepatic lobe on an MRI 10/10/2012 -status post stereotactic radiosurgeryto the dominant liver metastasis in  3 fractions February 10 through 10/28/2012. 5-FU/Avastin was resumed on a 3 week schedule 12/03/2012. CEA in normal range  on 12/03/2012 and 01/14/2013. Restaging MRI of the liver 02/21/2013 with a decrease in the dominant right hepatic lesion, stable medial left hepatic lobe lesion-but the left lesion is now causing obstruction of the left hepatic bile ducts 3. Status post Port-A-Cath placement and Port-A-Cath removal by Dr. Michaell Cowing. A new Port-A-Cath was placed on 05/16/2011. The Port-A-Cath was removed due to malposition. A new Port-A-Cath was placed on 05/30/2011. 4. Spina bifida with severe scoliosis.  5. Chronic bilateral foot drop/lower leg and foot numbness. 6. Hypothyroidism. 7. History of chemotherapy-induced diarrhea. The bolus and infusional 5-FU were reduced by 25% beginning with cycle number 2 of FOLFOX. The 5-FU dose was decreased by an additional 10% beginning with cycle number 8. 8. History of delayed nausea following chemotherapy. 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 The oxaliplatin was dose reduced by 20% beginning with cycle number 6. The neuropathy symptoms resolved. 10. History of neutropenia secondary to chemotherapy. 11. Status post a colonoscopy 05/05/2010. There was a previous right colectomy anastomosis and slight nodularity. Biopsies were taken which showed benign colonic mucosa with hyperplastic epithelial changes. No significant inflammation, granuloma, or adenomatous epithelium was identified. 12. Diarrhea following FOLFIRI chemotherapy. Overall the diarrhea is controlled with Lomotil.  13. History of a fungal rash in the groin, improved with Diflucan and nystatin powder. 14. Mouth ulcers, oral candidiasis following cycle #6 of FOLFIRI-the ulcers resolved following treatment with Diflucan. She continues to note mild mouth ulcers following each treatment. 15. Nausea during chemotherapy-Zofran was added to the chemotherapy regimen beginning on 07/09/2012. 16. Oozing at the gumline secondary to periodontal disease and Avastin 17. Jaundice secondary to biliary  obstruction  Disposition:  She has developed jaundice. This appears to be secondary to an obstructing metastasis in the left liver. She knows to contact us for a fever or chills. We will ask Dr. Christella Hartigan to review the MRI and consider an ERCP/stent placement. We held the 5-FU and Avastin today. She will return for an office visit in 3 weeks. We will check a CEA when she returns in 3 weeks.   Thornton Papas, MD  02/25/2013  12:50 PM

## 2013-02-27 NOTE — Transfer of Care (Signed)
Immediate Anesthesia Transfer of Care Note  Patient: April Spence  Procedure(s) Performed: Procedure(s): ENDOSCOPIC RETROGRADE CHOLANGIOPANCREATOGRAPHY (ERCP) (N/A) BILIARY STENT PLACEMENT (N/A)  Patient Location: PACU and Endoscopy Unit  Anesthesia Type:General  Level of Consciousness: awake, sedated and patient cooperative  Airway & Oxygen Therapy: Patient Spontanous Breathing and Patient connected to face mask oxygen  Post-op Assessment: Report given to PACU RN and Post -op Vital signs reviewed and stable  Post vital signs: Reviewed and stable  Complications: No apparent anesthesia complications

## 2013-02-27 NOTE — Interval H&P Note (Signed)
History and Physical Interval Note:  02/27/2013 11:22 AM  April Spence  has presented today for surgery, with the diagnosis of Jaundice [782.4] Biliary obstruction [576.2]  The various methods of treatment have been discussed with the patient and family. After consideration of risks, benefits and other options for treatment, the patient has consented to  Procedure(s): ENDOSCOPIC RETROGRADE CHOLANGIOPANCREATOGRAPHY (ERCP) (N/A) BILIARY STENT PLACEMENT (N/A) as a surgical intervention .  The patient's history has been reviewed, patient examined, no change in status, stable for surgery.  I have reviewed the patient's chart and labs.  Questions were answered to the patient's satisfaction.     Rob Bunting

## 2013-02-27 NOTE — Preoperative (Signed)
Beta Blockers   Reason not to administer Beta Blockers:Not Applicable 

## 2013-02-27 NOTE — Progress Notes (Signed)
Patient resting prone chest rolls in place, extremities pressure point pads in place, head cradle in place.

## 2013-02-27 NOTE — Progress Notes (Signed)
Right chest PAC deacessed after flushed with 10 ml NS and 5 ml 100 unit/44ml Heparin per protocol for discharge

## 2013-02-27 NOTE — Anesthesia Postprocedure Evaluation (Signed)
Anesthesia Post Note  Patient: April Spence  Procedure(s) Performed: Procedure(s) (LRB): ENDOSCOPIC RETROGRADE CHOLANGIOPANCREATOGRAPHY (ERCP) (N/A) BILIARY STENT PLACEMENT (N/A)  Anesthesia type: General  Patient location: PACU  Post pain: Pain level controlled  Post assessment: Post-op Vital signs reviewed  Last Vitals: BP 184/129  Temp(Src) 36.5 C (Oral)  Resp 16  SpO2 99%  Post vital signs: Reviewed  Level of consciousness: sedated  Complications: No apparent anesthesia complications

## 2013-02-28 ENCOUNTER — Encounter (HOSPITAL_COMMUNITY): Payer: Self-pay | Admitting: Gastroenterology

## 2013-03-04 ENCOUNTER — Telehealth: Payer: Self-pay | Admitting: Gastroenterology

## 2013-03-04 NOTE — Telephone Encounter (Signed)
Pt has been advised that she does not need an appt to come in for labs, she can walk in at any time convenient for her between 730 and 5 pm M-F.  Pt agreed and thanked me for calling

## 2013-03-06 ENCOUNTER — Ambulatory Visit: Payer: Medicare HMO | Admitting: Radiation Oncology

## 2013-03-06 ENCOUNTER — Other Ambulatory Visit (INDEPENDENT_AMBULATORY_CARE_PROVIDER_SITE_OTHER): Payer: Medicare HMO

## 2013-03-06 DIAGNOSIS — R933 Abnormal findings on diagnostic imaging of other parts of digestive tract: Secondary | ICD-10-CM

## 2013-03-06 LAB — COMPREHENSIVE METABOLIC PANEL
ALT: 52 U/L — ABNORMAL HIGH (ref 0–35)
AST: 118 U/L — ABNORMAL HIGH (ref 0–37)
Alkaline Phosphatase: 284 U/L — ABNORMAL HIGH (ref 39–117)
CO2: 24 mEq/L (ref 19–32)
Creatinine, Ser: 0.4 mg/dL (ref 0.4–1.2)
GFR: 196.12 mL/min (ref >60.00)
Total Bilirubin: 10.2 mg/dL — ABNORMAL HIGH (ref 0.3–1.2)

## 2013-03-10 ENCOUNTER — Other Ambulatory Visit: Payer: Self-pay

## 2013-03-10 DIAGNOSIS — R17 Unspecified jaundice: Secondary | ICD-10-CM

## 2013-03-10 NOTE — Progress Notes (Signed)
You have been scheduled for a CT scan of the abdomen and pelvis at [facility] ([address]---this is in the same building as [facility]).   You are scheduled on 03/11/13 at 130 pm. You should arrive 15 minutes prior to your appointment time for registration. Please follow the written instructions below on the day of your exam:  WARNING: IF YOU ARE ALLERGIC TO IODINE/X-RAY DYE, PLEASE NOTIFY RADIOLOGY IMMEDIATELY AT [phone]! YOU WILL BE GIVEN A 13 HOUR PREMEDICATION PREP.  1) Do not eat or drink anything after 930 am  (4 hours prior to your test) 2) You have been given 2 bottles of oral contrast to drink. The solution may taste better if refrigerated, but do NOT add ice or any other liquid to this solution. Shake well before drinking.    Drink 1 bottle of contrast @ 1130 am (2 hours prior to your exam)  Drink 1 bottle of contrast @ 1230 pm  (1 hour prior to your exam)  You may take any medications as prescribed with a small amount of water except for the following: Metformin, Glucophage, Glucovance, Avandamet, Riomet, Fortamet, Actoplus Met, Janumet, Glumetza or Metaglip. The above medications must be held the day of the exam AND 48 hours after the exam.  The purpose of you drinking the oral contrast is to aid in the visualization of your intestinal tract. The contrast solution may cause some diarrhea. Before your exam is started, you will be given a small amount of fluid to drink. Depending on your individual set of symptoms, you may also receive an intravenous injection of x-ray contrast/dye. Plan on being at [facility] for 30 minutes or long, depending on the type of exam you are having performed.  This test typically takes 30-45 minutes to complete.  If you have any questions regarding your exam or if you need to reschedule, you may call the CT department at [phone] between the hours of 8:00 am and 5:00 pm, Monday-Friday.  Pt ask to pick the contrast  up at [facility] and drink the contrast there.  Pt is aware she will be there several hours to drink and Rose at CT was notified

## 2013-03-11 ENCOUNTER — Ambulatory Visit (INDEPENDENT_AMBULATORY_CARE_PROVIDER_SITE_OTHER)
Admission: RE | Admit: 2013-03-11 | Discharge: 2013-03-11 | Disposition: A | Discharge status: Home / Self Care | Payer: Medicare HMO | Source: Ambulatory Visit | Attending: Gastroenterology | Admitting: Gastroenterology

## 2013-03-11 DIAGNOSIS — R17 Unspecified jaundice: Secondary | ICD-10-CM

## 2013-03-11 MED ORDER — IOHEXOL 300 MG/ML  SOLN
100.0000 mL | Freq: Once | INTRAMUSCULAR | Status: AC | PRN
  Administered 2013-03-11: 100 mL via INTRAVENOUS

## 2013-03-12 ENCOUNTER — Encounter (HOSPITAL_COMMUNITY): Payer: Self-pay

## 2013-03-12 ENCOUNTER — Inpatient Hospital Stay (HOSPITAL_COMMUNITY)
Admission: AD | Admit: 2013-03-12 | Discharge: 2013-03-14 | DRG: 436 | Disposition: A | Discharge status: Home Health | Payer: Medicare HMO | Source: Ambulatory Visit | Attending: Internal Medicine | Admitting: Internal Medicine

## 2013-03-12 DIAGNOSIS — C801 Malignant (primary) neoplasm, unspecified: Secondary | ICD-10-CM

## 2013-03-12 DIAGNOSIS — R932 Abnormal findings on diagnostic imaging of liver and biliary tract: Secondary | ICD-10-CM

## 2013-03-12 DIAGNOSIS — K831 Obstruction of bile duct: Secondary | ICD-10-CM

## 2013-03-12 DIAGNOSIS — C787 Secondary malignant neoplasm of liver and intrahepatic bile duct: Principal | ICD-10-CM

## 2013-03-12 DIAGNOSIS — E785 Hyperlipidemia, unspecified: Secondary | ICD-10-CM | POA: Diagnosis present

## 2013-03-12 DIAGNOSIS — C189 Malignant neoplasm of colon, unspecified: Secondary | ICD-10-CM | POA: Diagnosis present

## 2013-03-12 DIAGNOSIS — F3289 Other specified depressive episodes: Secondary | ICD-10-CM | POA: Diagnosis present

## 2013-03-12 DIAGNOSIS — Z9221 Personal history of antineoplastic chemotherapy: Secondary | ICD-10-CM

## 2013-03-12 DIAGNOSIS — I059 Rheumatic mitral valve disease, unspecified: Secondary | ICD-10-CM | POA: Diagnosis present

## 2013-03-12 DIAGNOSIS — Q059 Spina bifida, unspecified: Secondary | ICD-10-CM

## 2013-03-12 DIAGNOSIS — Z9049 Acquired absence of other specified parts of digestive tract: Secondary | ICD-10-CM

## 2013-03-12 DIAGNOSIS — R17 Unspecified jaundice: Secondary | ICD-10-CM

## 2013-03-12 DIAGNOSIS — F329 Major depressive disorder, single episode, unspecified: Secondary | ICD-10-CM | POA: Diagnosis present

## 2013-03-12 DIAGNOSIS — K838 Other specified diseases of biliary tract: Secondary | ICD-10-CM | POA: Diagnosis present

## 2013-03-12 DIAGNOSIS — E039 Hypothyroidism, unspecified: Secondary | ICD-10-CM | POA: Diagnosis present

## 2013-03-12 DIAGNOSIS — M4120 Other idiopathic scoliosis, site unspecified: Secondary | ICD-10-CM | POA: Diagnosis present

## 2013-03-12 DIAGNOSIS — K5900 Constipation, unspecified: Secondary | ICD-10-CM

## 2013-03-12 LAB — CBC
HCT: 42.3 % (ref 36.0–46.0)
MCH: 32.6 pg (ref 26.0–34.0)
MCV: 99.3 fL (ref 78.0–100.0)
Platelets: 215 10*3/uL (ref 150–400)
RBC: 4.26 MIL/uL (ref 3.87–5.11)
WBC: 8.3 10*3/uL (ref 4.0–10.5)

## 2013-03-12 LAB — COMPREHENSIVE METABOLIC PANEL
ALT: 45 U/L — ABNORMAL HIGH (ref 0–35)
AST: 103 U/L — ABNORMAL HIGH (ref 0–37)
Alkaline Phosphatase: 286 U/L — ABNORMAL HIGH (ref 39–117)
CO2: 28 mEq/L (ref 19–32)
Calcium: 9.7 mg/dL (ref 8.4–10.5)
GFR calc Af Amer: >90 mL/min (ref >90)
Glucose, Bld: 93 mg/dL (ref 70–99)
Potassium: 4.4 mEq/L (ref 3.5–5.1)
Sodium: 132 mEq/L — ABNORMAL LOW (ref 135–145)
Total Protein: 7.9 g/dL (ref 6.0–8.3)

## 2013-03-12 MED ORDER — FLUOXETINE HCL 20 MG PO CAPS
20.0000 mg | ORAL_CAPSULE | Freq: Every morning | ORAL | Status: DC
  Administered 2013-03-13 – 2013-03-14 (×2): 20 mg via ORAL
  Filled 2013-03-12 (×2): qty 1

## 2013-03-12 MED ORDER — FENOFIBRATE 160 MG PO TABS
160.0000 mg | ORAL_TABLET | Freq: Every day | ORAL | Status: DC
  Administered 2013-03-13 – 2013-03-14 (×2): 160 mg via ORAL
  Filled 2013-03-12 (×2): qty 1

## 2013-03-12 MED ORDER — CHLORHEXIDINE GLUCONATE 0.12 % MT SOLN
15.0000 mL | Freq: Two times a day (BID) | OROMUCOSAL | Status: DC
  Administered 2013-03-12 – 2013-03-14 (×5): 15 mL via OROMUCOSAL
  Filled 2013-03-12 (×6): qty 15

## 2013-03-12 MED ORDER — LEVOTHYROXINE SODIUM 150 MCG PO TABS
150.0000 ug | ORAL_TABLET | Freq: Every day | ORAL | Status: DC
  Administered 2013-03-13 – 2013-03-14 (×2): 150 ug via ORAL
  Filled 2013-03-12 (×3): qty 1

## 2013-03-12 MED ORDER — LORAZEPAM 0.5 MG PO TABS
0.5000 mg | ORAL_TABLET | Freq: Two times a day (BID) | ORAL | Status: DC | PRN
  Administered 2013-03-12 – 2013-03-13 (×2): 0.5 mg via ORAL
  Filled 2013-03-12 (×2): qty 1

## 2013-03-12 MED ORDER — ACETAMINOPHEN 650 MG RE SUPP: 650.0000 mg | Freq: Four times a day (QID) | RECTAL | Status: DC | PRN

## 2013-03-12 MED ORDER — ONDANSETRON HCL 4 MG/2ML IJ SOLN
4.0000 mg | Freq: Four times a day (QID) | INTRAMUSCULAR | Status: DC | PRN
  Administered 2013-03-12 – 2013-03-13 (×3): 4 mg via INTRAVENOUS
  Filled 2013-03-12 (×3): qty 2

## 2013-03-12 MED ORDER — PROPRANOLOL HCL 80 MG PO TABS
80.0000 mg | ORAL_TABLET | Freq: Every morning | ORAL | Status: DC
  Administered 2013-03-13 – 2013-03-14 (×2): 80 mg via ORAL
  Filled 2013-03-12 (×2): qty 1

## 2013-03-12 MED ORDER — ONDANSETRON HCL 4 MG PO TABS
4.0000 mg | ORAL_TABLET | Freq: Four times a day (QID) | ORAL | Status: DC | PRN
  Administered 2013-03-14: 4 mg via ORAL
  Filled 2013-03-12: qty 1

## 2013-03-12 MED ORDER — SODIUM CHLORIDE 0.9 % IJ SOLN
3.0000 mL | Freq: Two times a day (BID) | INTRAMUSCULAR | Status: DC
  Administered 2013-03-14: 3 mL via INTRAVENOUS

## 2013-03-12 MED ORDER — CIPROFLOXACIN IN D5W 400 MG/200ML IV SOLN
400.0000 mg | INTRAVENOUS | Status: AC
  Administered 2013-03-13: 400 mg via INTRAVENOUS
  Filled 2013-03-12: qty 200

## 2013-03-12 MED ORDER — MORPHINE SULFATE 2 MG/ML IJ SOLN
1.0000 mg | INTRAMUSCULAR | Status: DC | PRN
  Administered 2013-03-12 – 2013-03-13 (×2): 1 mg via INTRAVENOUS
  Filled 2013-03-12 (×2): qty 1

## 2013-03-12 MED ORDER — DIPHENOXYLATE-ATROPINE 2.5-0.025 MG PO TABS: 1.0000 | ORAL_TABLET | Freq: Four times a day (QID) | ORAL | Status: DC | PRN

## 2013-03-12 MED ORDER — POTASSIUM CHLORIDE IN NACL 20-0.45 MEQ/L-% IV SOLN
INTRAVENOUS | Status: DC
  Administered 2013-03-12: 1000 mL via INTRAVENOUS
  Filled 2013-03-12 (×4): qty 1000

## 2013-03-12 MED ORDER — FENOFIBRATE 160 MG PO TABS
160.0000 mg | ORAL_TABLET | Freq: Every day | ORAL | Status: DC
  Filled 2013-03-12: qty 1

## 2013-03-12 MED ORDER — OXYCODONE-ACETAMINOPHEN 5-325 MG PO TABS
1.0000 | ORAL_TABLET | Freq: Four times a day (QID) | ORAL | Status: DC | PRN
  Administered 2013-03-12 – 2013-03-13 (×2): 1 via ORAL
  Filled 2013-03-12 (×2): qty 1

## 2013-03-12 MED ORDER — NYSTATIN 100000 UNIT/GM EX POWD
1.0000 g | Freq: Every day | CUTANEOUS | Status: DC | PRN
  Filled 2013-03-12: qty 15

## 2013-03-12 MED ORDER — SODIUM CHLORIDE 0.9 % IV SOLN: 250.0000 mL | INTRAVENOUS | Status: DC | PRN

## 2013-03-12 MED ORDER — ACETAMINOPHEN 325 MG PO TABS: 650.0000 mg | ORAL_TABLET | Freq: Four times a day (QID) | ORAL | Status: DC | PRN

## 2013-03-12 MED ORDER — SODIUM CHLORIDE 0.9 % IJ SOLN: 3.0000 mL | INTRAMUSCULAR | Status: DC | PRN

## 2013-03-12 MED ORDER — AMITRIPTYLINE HCL 25 MG PO TABS
25.0000 mg | ORAL_TABLET | Freq: Every day | ORAL | Status: DC
  Administered 2013-03-12 – 2013-03-13 (×2): 25 mg via ORAL
  Filled 2013-03-12 (×3): qty 1

## 2013-03-12 NOTE — Progress Notes (Signed)
Patient ID: April Spence, female   DOB: [birth year], 57 y.o.   MRN: [id number]                                                                                                                                                                                                                                                                                                                                         Request received for placement of a left biliary drain in pt with obstructive jaundice secondary to metastatic colon ca to liver. Pt is s/p placement of a metallic biliary stent to right hepatic lobe 02/27/2013 with persistent and rising total bilirubin. Recent CT demonstrates moderate left sided intrahepatic ductal dilatation. Imaging studies were reviewed by Dr. Archer Asa. Additional PMH as below.  Exam: pt awake/alert; jaundiced; chest- CTA bilat., intact left chest PAC; heart- RRR; abd- soft, +BS ,mild RUQ/epigastric tenderness to palpation;  ext- 1+ edema; pt has severe scoliosis (hx spina bifida)  .                Filed Vitals:   03/12/13 1422  BP: 131/83  Pulse: 76  Temp: 98.1 F (36.7 C)  TempSrc: Oral  Resp: 18  Height: 5' (1.524 m)  Weight: 134 lb 11.2 oz (61.1 kg)  SpO2: 99%   Past Medical History  Diagnosis Date  . colon ca dx'd 04/2009    chemo comp 11/2009.. colon and liver  . Spina bifida   . Scoliosis   . Hyperlipidemia   . Foot drop     bilateral,wears braces  . Mitral valve prolapse   . Hypothyroidism   . Depression   . History of chemotherapy     5-FU and Avastin 12 cycles FOLFOX  . Colon cancer 05/06/2010    metastatic  . Hepatic metastases 08/15/12    per ct abdomen  . Radiation 10/21/12-10/28/12    Palliative liver mets  54 gray in 3 fx's  . Arrhythmia 02-26-13    "rapid heart rate occ."   Past Surgical History  Procedure Laterality Date  . Cholecystectomy    . Back surgery  1980 and 2010  . Knee arthroscopy  2005    left  . Tubal ligation  1973  . Colonoscopy   05/05/2010  . Colon surgery      2010  . Portacath placement  05/30/2011    tip at cavoatrialjjunction by xray  . Ercp N/A 02/27/2013    Procedure: ENDOSCOPIC RETROGRADE CHOLANGIOPANCREATOGRAPHY (ERCP);  Surgeon: Rachael Fee, MD;  Location: [facility];  Service: Endoscopy;  Laterality: N/A;  . Biliary stent placement N/A 02/27/2013    Procedure: BILIARY STENT PLACEMENT;  Surgeon: Rachael Fee, MD;  Location: [facility];  Service: Endoscopy;  Laterality: N/A;   Ct Abdomen Pelvis W Contrast  03/11/2013   *RADIOLOGY REPORT*  Clinical Data: Increasing bilirubin.  Nausea.  History of colon cancer with metastases to liver.  Radiation therapy chemotherapy. Recent biliary stent placement.  CT ABDOMEN AND PELVIS WITH CONTRAST  Technique:  Multidetector CT imaging of the abdomen and pelvis was performed following the standard protocol during bolus administration of intravenous contrast.  Contrast: OMNIPAQUE IOHEXOL 300 MG/ML  SOLN  Comparison: ERCP of 02/27/2013.  MRI of 02/21/2013.  Most recent CT of 08/15/2012.  Findings: Lung bases:  Normal  Abdomen/pelvis:  The right liver lobe lesion described on 02/21/2013 is improved and partially obscured by the tip of the biliary stent.  Likely measures on the order of 2.2 cm on image 16/series 2.  2.6 cm of same level on the prior.  Mild cephalad right-sided intrahepatic biliary ductal dilatation has resolved.  The moderate left intrahepatic biliary ductal dilatation is similar to 02/21/2013.  For example, index intrahepatic duct measures 9 mm on image 22/series 2 versus 1.0 cm at same level on the prior exam. The common duct stent terminates proximally in the central right hepatic duct.  The obstruction involving the central left hepatic duct is separate, and measures 1.5 x 1.7 cm on image 20/series 2. No new left-sided lesions are identified.  Small splenule.  Anatomic distortion secondary to extent of spinal curvature.  Mild pancreatic atrophy.  The  biliary stent terminates at the level of the ampulla.  Portal veins patent.  Hepatic veins not well evaluated.  Cholecystectomy.  Normal adrenal glands.  Normal kidneys.  Retroaortic left renal vein.  No retroperitoneal adenopathy.  Large amount of colonic stool.  Surgical changes of right hemicolectomy.  Normal small bowel without abdominal ascites.    No evidence of omental or peritoneal disease.  No pelvic adenopathy.  Normal urinary bladder.  Probable exophytic left sided dystrophic uterine fibroid.  This is unchanged. No adnexal mass or significant free fluid.  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  IMPRESSION:  1.  Since 02/21/2013, placement of a biliary stent.  This has its cephalad portion in the right hepatic ductal system, and does not traverse the previously described central left hepatic lobe stricture.  The stricture is similar in size and causes similar moderate left sided intrahepatic ductal dilatation. 2.  Decreased size of a right hepatic lobe metastasis, partially obscured by the tip of the biliary stent.  Ductal dilatation in this area has resolved. These results will be called to the ordering clinician or representative by the Radiologist Assistant, and communication documented in the PACS Dashboard. 3.  No evidence of extrahepatic  metastatic disease. 4. Possible constipation. 5.  Distorted anatomy secondary to the extent of spinal curvature.   Original Report Authenticated By: Jeronimo Greaves, M.D.   Mr Liver W Wo Contrast  02/21/2013   *RADIOLOGY REPORT*  Clinical Data:  Metastatic colon cancer.  MRI ABDOMEN WITHOUT AND WITH CONTRAST  Technique:  Multiplanar multisequence MR imaging of the abdomen was performed both before and after the administration of intravenous contrast.  Contrast: 13mL MULTIHANCE GADOBENATE DIMEGLUMINE 529 MG/ML IV SOLN  Comparison:  10/10/2012  Findings:  Right hepatic lobe metastatic lesion measures 2.6 x 2.2  cm, image 36/12/30.  Previously this measured 4.0 x 3.1 cm. Lesion within the medial segment of left hepatic lobe is not significantly changed in size from previous exam measuring 1 cm.  However, however, there is now involvement of the central left hepatic lobe bile duct resulting in significant left hepatic lobe ductal dilatation.  Stable caliber of the common bile duct  Normal appearance of the pancreas.  The spleen appears normal.  The adrenal glands are both unremarkable.  Normal appearance of both kidneys.  No significant upper abdominal adenopathy.  No free fluid identified.  IMPRESSION: 1.  The dominant lesion in the right hepatic lobe has decreased in size from previous exam. 2.  Medial segment of left hepatic lobe lesion is not significantly changed in size, but is now obstructing the left hepatic lobe bile ducts.   Original Report Authenticated By: Signa Kell, M.D.   Dg Ercp  02/27/2013   *RADIOLOGY REPORT*  Clinical Data: History of metastatic colon carcinoma with liver metastases, biliary obstruction and jaundice.  ERCP  Comparison:  None.  Technique:  Multiple spot images obtained with the fluoroscopic device and submitted for interpretation post-procedure.  ERCP was performed by Dr. Christella Hartigan.  Findings: Imaging during the endoscopic procedure demonstrates cannulation of the common bile duct.  Injection shows evidence of irregular and dilated biliary ducts in the left lobe of the liver with a focal structure in the liver at the level of the central left duct.  Balloon dilatation was performed of the central segment of the left intrahepatic bile duct.  A self-expanding noncovered metallic stent was then placed extending to the left lobe of the liver and across the stricture to the level of the common bile duct.  The stent shows normal patency.  IMPRESSION: Imaging shows evidence of left intrahepatic biliary ductal obstruction due to a stricture of the central left intrahepatic bile duct.  This was  treated with balloon dilatation and placement of a self-expanding noncovered metal stent.  These images were submitted for radiologic interpretation only. Please see the procedural report for the amount of contrast and the fluoroscopy time utilized.   Original Report Authenticated By: Irish Lack, M.D.  Results for orders placed during the hospital encounter of 03/12/13  COMPREHENSIVE METABOLIC PANEL      Result Value Range   Sodium 132 (*) 135 - 145 mEq/L   Potassium 4.4  3.5 - 5.1 mEq/L   Chloride 97  96 - 112 mEq/L   CO2 28  19 - 32 mEq/L   Glucose, Bld 93  70 - 99 mg/dL   BUN 7  6 - 23 mg/dL   Creatinine, Ser 4.54 (*) 0.50 - 1.10 mg/dL   Calcium 9.7  8.4 - 09.8 mg/dL   Total Protein 7.9  6.0 - 8.3 g/dL   Albumin 2.1 (*) 3.5 - 5.2 g/dL   AST 119 (*) 0 - 37 U/L   ALT  45 (*) 0 - 35 U/L   Alkaline Phosphatase 286 (*) 39 - 117 U/L   Total Bilirubin 11.0 (*) 0.3 - 1.2 mg/dL   GFR calc non Af Amer >90  >90 mL/min   GFR calc Af Amer >90  >90 mL/min  PROTIME-INR      Result Value Range   Prothrombin Time 16.4 (*) 11.6 - 15.2 seconds   INR 1.36  0.00 - 1.49  CBC      Result Value Range   WBC 8.3  4.0 - 10.5 K/uL   RBC 4.26  3.87 - 5.11 MIL/uL   Hemoglobin 13.9  12.0 - 15.0 g/dL   HCT 78.2  95.6 - 21.3 %   MCV 99.3  78.0 - 100.0 fL   MCH 32.6  26.0 - 34.0 pg   MCHC 32.9  30.0 - 36.0 g/dL   RDW 08.6 (*) 57.8 - 46.9 %   Platelets 215  150 - 400 K/uL  APTT      Result Value Range   aPTT 39 (*) 24 - 37 seconds   A/P: Pt with obstructive jaundice secondary to metastatic colon carcinoma to liver and increasing total bilirubin despite metallic stenting of right hepatic lobe. Pt also with moderate left intrahepatic ductal dilatation and distorted anatomy secondary to severe scoliosis from spina bifida. Plan is for left biliary drain placement on 7/3. Details/risks of procedure d/w pt/husband with their understanding and consent.

## 2013-03-12 NOTE — H&P (Signed)
Primary Care Physician:  Nadean Corwin, MD Primary Gastroenterologist:  Dr. Christella Hartigan  CHIEF COMPLAINT:  Obstructive Jaundice  HPI: April Spence is a 57 y.o. female who has past medical history of spina bifida with severe scoliosis, hypothyroidism, and metastatic colon cancer with liver mets and biliary obstruction s/p right hemi-colectomy.  She developed malignant biliary obstruction and underwent ERCP with permanent drain placed.  Anatomy was distorted due to here severe scoliosis and stent was placed in the right hepatic lobe.  She was sent home with a 3 day course of antibiotics.  Repeat LFT's showed a bili trending up from 7.4 to 10.  Repeat CT scan showed moderate left sided intrahepatic ductal dilitation.  She is being admitted in order to have an IR consult for placement of a perc drain into the left hepatic lobe.  She has some RUQ abdominal pain and back pain, but appears comfortable during my visit.  No nausea, vomiting, fevers, chills.  No chest pain or SOB.  Had a good BM this AM that was light in color.  Urine is dark.  Is eating and drinking well.  Very pleasant.  Follows with Dr. Truett Perna for chemo.   Past Medical History  Diagnosis Date  . colon ca dx'd 04/2009    chemo comp 11/2009.. colon and liver  . Spina bifida   . Scoliosis   . Hyperlipidemia   . Foot drop     bilateral,wears braces  . Mitral valve prolapse   . Hypothyroidism   . Depression   . History of chemotherapy     5-FU and Avastin 12 cycles FOLFOX  . Colon cancer 05/06/2010    metastatic  . Hepatic metastases 08/15/12    per ct abdomen  . Radiation 10/21/12-10/28/12    Palliative liver mets 54 gray in 3 fx's  . Arrhythmia 02-26-13    "rapid heart rate occ."    Past Surgical History  Procedure Laterality Date  . Cholecystectomy    . Back surgery  1980 and 2010  . Knee arthroscopy  2005    left  . Tubal ligation  1973  . Colonoscopy  05/05/2010  . Colon surgery      2010  . Portacath placement   05/30/2011    tip at cavoatrialjjunction by xray  . Ercp N/A 02/27/2013    Procedure: ENDOSCOPIC RETROGRADE CHOLANGIOPANCREATOGRAPHY (ERCP);  Surgeon: Rachael Fee, MD;  Location: [facility];  Service: Endoscopy;  Laterality: N/A;  . Biliary stent placement N/A 02/27/2013    Procedure: BILIARY STENT PLACEMENT;  Surgeon: Rachael Fee, MD;  Location: [facility];  Service: Endoscopy;  Laterality: N/A;    Prior to Admission medications   Medication Sig Start Date End Date Taking? Authorizing Provider  Alum & Mag Hydroxide-Simeth (MAGIC MOUTHWASH) SOLN Take 10 mLs by mouth 4 (four) times daily as needed. Swish and spit 08/22/12   Ladene Artist, MD  amitriptyline (ELAVIL) 25 MG tablet Take 25 mg by mouth at bedtime.  02/18/11   Historical Provider, MD  chlorhexidine (PERIDEX) 0.12 % solution Use as directed 15 mLs in the mouth or throat 2 (two) times daily.  02/20/13   Historical Provider, MD  Cholecalciferol (VITAMIN D-3) 5000 UNITS TABS Take 5,000 mg by mouth 2 (two) times daily.    Historical Provider, MD  ciprofloxacin (CIPRO) 500 MG tablet Take 1 tablet (500 mg total) by mouth 2 (two) times daily. 02/27/13   Rachael Fee, MD  diphenoxylate-atropine (LOMOTIL) 2.5-0.025 MG per tablet Take  1 tablet by mouth 4 (four) times daily as needed for diarrhea or loose stools.    Historical Provider, MD  fenofibrate micronized (LOFIBRA) 134 MG capsule Take 134 mg by mouth daily. 10/28/12   Historical Provider, MD  FLUoxetine (PROZAC) 20 MG capsule Take 20 mg by mouth every morning.  03/30/11   Historical Provider, MD  levothyroxine (SYNTHROID, LEVOTHROID) 150 MCG tablet Take 150 mcg by mouth daily before breakfast.    Historical Provider, MD  lidocaine-prilocaine (EMLA) cream Apply topically as needed. APPLY TO PORTACATH SITE 1-2 HOURS PRIOR TO USE 04/23/12   Rana Snare, NP  LORazepam (ATIVAN) 0.5 MG tablet Take 1 tablet (0.5 mg total) by mouth every 12 (twelve) hours as needed for anxiety. 05/27/12    Ladene Artist, MD  nystatin (NYSTOP) 100000 UNIT/GM POWD Apply 1 g topically daily as needed (for rash). In summer on irritated areas 07/11/11   Historical Provider, MD  ondansetron (ZOFRAN) 8 MG tablet Take 1 tablet (8 mg total) by mouth every 12 (twelve) hours as needed for nausea. 01/02/13   Ladene Artist, MD  oxyCODONE-acetaminophen (PERCOCET) 5-325 MG per tablet Take 1 tablet by mouth every 6 (six) hours as needed for pain.     Historical Provider, MD  prochlorperazine (COMPAZINE) 10 MG tablet Take 10 mg by mouth every 6 (six) hours as needed. 07/12/11   Ladene Artist, MD  propranolol (INDERAL) 80 MG tablet Take 80 mg by mouth every morning.  04/18/11   Historical Provider, MD  traMADol-acetaminophen (ULTRACET) 37.5-325 MG per tablet Take 1 tablet by mouth every 6 (six) hours as needed for pain.  02/24/11   Historical Provider, MD    Current Facility-Administered Medications  Medication Dose Route Frequency Provider Last Rate Last Dose  . 0.45 % NaCl with KCl 20 mEq / L infusion   Intravenous Continuous Zakar Brosch D. Kaleah Hagemeister, PA-C      . 0.9 %  sodium chloride infusion  250 mL Intravenous PRN Ruhee Enck D. Milli Woolridge, PA-C      . acetaminophen (TYLENOL) tablet 650 mg  650 mg Oral Q6H PRN Princella Pellegrini. Alonza Knisley, PA-C       Or  . acetaminophen (TYLENOL) suppository 650 mg  650 mg Rectal Q6H PRN Princella Pellegrini. Esther Bradstreet, PA-C      . amitriptyline (ELAVIL) tablet 25 mg  25 mg Oral QHS Inaki Vantine D. Carnetta Losada, PA-C      . chlorhexidine (PERIDEX) 0.12 % solution 15 mL  15 mL Mouth/Throat BID Shakti Fleer D. Jamie Belger, PA-C      . diphenoxylate-atropine (LOMOTIL) 2.5-0.025 MG per tablet 1 tablet  1 tablet Oral QID PRN Princella Pellegrini. Desta Bujak, PA-C      . fenofibrate tablet 160 mg  160 mg Oral Daily Bryahna Lesko D. Braxen Dobek, PA-C      . FLUoxetine (PROZAC) capsule 20 mg  20 mg Oral q morning - 10a Aniko Finnigan D. Kameshia Madruga, PA-C      . [START ON 03/13/2013] levothyroxine (SYNTHROID, LEVOTHROID) tablet 150 mcg  150 mcg Oral QAC breakfast Aleanna Menge D. Aryiah Monterosso, PA-C      .  LORazepam (ATIVAN) tablet 0.5 mg  0.5 mg Oral Q12H PRN Princella Pellegrini. Hend Mccarrell, PA-C      . morphine 2 MG/ML injection 1 mg  1 mg Intravenous Q2H PRN Coltin Casher D. Curby Carswell, PA-C      . nystatin (MYCOSTATIN/NYSTOP) topical powder 1 g  1 g Topical Daily PRN Princella Pellegrini. Ember Henrikson, PA-C      . ondansetron (ZOFRAN) tablet 4 mg  4 mg Oral Q6H PRN Princella Pellegrini. Benjamin Casanas, PA-C       Or  . ondansetron (ZOFRAN) injection 4 mg  4 mg Intravenous Q6H PRN Lamya Lausch D. Korby Ratay, PA-C      . oxyCODONE-acetaminophen (PERCOCET/ROXICET) 5-325 MG per tablet 1 tablet  1 tablet Oral Q6H PRN Princella Pellegrini. Mirza Fessel, PA-C      . propranolol (INDERAL) tablet 80 mg  80 mg Oral q morning - 10a Kamia Insalaco D. Nely Dedmon, PA-C      . sodium chloride 0.9 % injection 3 mL  3 mL Intravenous Q12H Aerial Dilley D. Angelice Piech, PA-C      . sodium chloride 0.9 % injection 3 mL  3 mL Intravenous PRN Kindsey Eblin D. Sherise Geerdes, PA-C       Facility-Administered Medications Ordered in Other Encounters  Medication Dose Route Frequency Provider Last Rate Last Dose  . fluorouracil (ADRUCIL) 2,600 mg in sodium chloride 0.9 % 150 mL chemo infusion  1,475 mg/m2 (Treatment Plan Actual) Intravenous 1 day or 1 dose Ladene Artist, MD   2,600 mg at 11/12/12 1603  . heparin lock flush 100 unit/mL  500 Units Intracatheter Once Ladene Artist, MD      . sodium chloride 0.9 % injection 10 mL  10 mL Intracatheter PRN Ladene Artist, MD        Allergies as of 03/12/2013 - Review Complete 03/12/2013  Allergen Reaction Noted  . Adhesive (tape)  05/22/2011    Family History  Problem Relation Age of Onset  . Cancer Sister     breast  . Cancer Maternal Grandmother     breast  . Cancer Other 37    breast ca    History   Social History  . Marital Status: Married    Spouse Name: N/A    Number of Children: 0  . Years of Education: N/A   Occupational History  . Not on file.   Social History Main Topics  . Smoking status: Former Smoker    Quit date: 06/27/2006  . Smokeless tobacco: Never Used  . Alcohol  Use: No  . Drug Use: No  . Sexually Active: Not Currently   Other Topics Concern  . Not on file   Social History Narrative  . No narrative on file    Review of Systems: Ten point ROS is O/W negative except as mentioned in HPI.  Physical Exam: Vital signs in last 24 hours: Temp:  [98.1 F (36.7 C)] 98.1 F (36.7 C) (07/02 1422) Pulse Rate:  [76] 76 (07/02 1422) Resp:  [18] 18 (07/02 1422) BP: (131)/(83) 131/83 mmHg (07/02 1422) SpO2:  [99 %] 99 % (07/02 1422) Weight:  [134 lb 11.2 oz (61.1 kg)] 134 lb 11.2 oz (61.1 kg) (07/02 1422) General:   Alert, Well-developed, well-nourished, pleasant and cooperative in NAD; jaundiced. Head:  Normocephalic and atraumatic. Eyes:  Scleral icterus is present. Ears:  Normal auditory acuity. Mouth:  No deformity or lesions.  Oropharynx pink & moist. Lungs:  Clear throughout to auscultation.  No wheezes, crackles, or rhonchi. Heart:  Regular rate and rhythm; no murmurs, clicks, rubs,  or gallops. Abdomen:  Soft, non-distended.  BS present.  Mild RUQ TTP without R/R/G. Msk:  Severe scoliosis.  Chronic LE deformities and weakness. Pulses:  Normal pulses noted. Extremities:  +1 pitting edema in B/L LE's.   Neurologic:  Alert and  oriented x4;  grossly normal neurologically. Skin:  Intact without significant lesions or rashes.  Jaundiced. Psych:  Alert and cooperative. Normal  mood and affect.  Studies/Results: Ct Abdomen Pelvis W Contrast  03/11/2013   *RADIOLOGY REPORT*  Clinical Data: Increasing bilirubin.  Nausea.  History of colon cancer with metastases to liver.  Radiation therapy chemotherapy. Recent biliary stent placement.  CT ABDOMEN AND PELVIS WITH CONTRAST  Technique:  Multidetector CT imaging of the abdomen and pelvis was performed following the standard protocol during bolus administration of intravenous contrast.  Contrast: OMNIPAQUE IOHEXOL 300 MG/ML  SOLN  Comparison: ERCP of 02/27/2013.  MRI of 02/21/2013.  Most recent CT of  08/15/2012.  Findings: Lung bases:  Normal  Abdomen/pelvis:  The right liver lobe lesion described on 02/21/2013 is improved and partially obscured by the tip of the biliary stent.  Likely measures on the order of 2.2 cm on image 16/series 2.  2.6 cm of same level on the prior.  Mild cephalad right-sided intrahepatic biliary ductal dilatation has resolved.  The moderate left intrahepatic biliary ductal dilatation is similar to 02/21/2013.  For example, index intrahepatic duct measures 9 mm on image 22/series 2 versus 1.0 cm at same level on the prior exam. The common duct stent terminates proximally in the central right hepatic duct.  The obstruction involving the central left hepatic duct is separate, and measures 1.5 x 1.7 cm on image 20/series 2. No new left-sided lesions are identified.  Small splenule.  Anatomic distortion secondary to extent of spinal curvature.  Mild pancreatic atrophy.  The biliary stent terminates at the level of the ampulla.  Portal veins patent.  Hepatic veins not well evaluated.  Cholecystectomy.  Normal adrenal glands.  Normal kidneys.  Retroaortic left renal vein.  No retroperitoneal adenopathy.  Large amount of colonic stool.  Surgical changes of right hemicolectomy.  Normal small bowel without abdominal ascites.    No evidence of omental or peritoneal disease.  No pelvic adenopathy.  Normal urinary bladder.  Probable exophytic left sided dystrophic uterine fibroid.  This is unchanged. No adnexal mass or significant free fluid.  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  IMPRESSION:  1.  Since 02/21/2013, placement of a biliary stent.  This has its cephalad portion in the right hepatic ductal system, and does not traverse the previously described central left hepatic lobe stricture.  The stricture is similar in size and causes similar moderate left sided intrahepatic ductal dilatation. 2.  Decreased size of a right hepatic lobe  metastasis, partially obscured by the tip of the biliary stent.  Ductal dilatation in this area has resolved. These results will be called to the ordering clinician or representative by the Radiologist Assistant, and communication documented in the PACS Dashboard. 3.  No evidence of extrahepatic metastatic disease. 4. Possible constipation. 5.  Distorted anatomy secondary to the extent of spinal curvature.   Original Report Authenticated By: Jeronimo Greaves, M.D.    Impression / Plan:  -Obstructive jaundice secondary to metastatic colon cancer with liver mets s/p placement of permanent biliary stent on 6/19 into right hepatic lobe.  Bili increasing.  Patient without fevers, no significant pain.  Will check labs.  Consult to IR for perc drain to left hepatic lobe.  Restarted home meds. -Metastatic colon cancer s/p right hemicolectomy. -Spina bifida with severe scoliosis  -Hypothyroidism    LOS: 0 days   Nashira Mcglynn D.  03/12/2013, 2:41 PM

## 2013-03-12 NOTE — H&P (Signed)
Patient seen, examined, and I agree with the above documentation, including the assessment and plan. Obstructive jaundice for metastatic colon cancer to the liver. Status post metallic biliary stent on 02/27/13 without improvement in LFTs. Affect bilirubin rising. No evidence of cholangitis. Plan is for interventional radiology percutaneous biliary drain. Hopefully eventually this external drain can be internalized Appreciate interventional radiology assistance

## 2013-03-13 ENCOUNTER — Inpatient Hospital Stay (HOSPITAL_COMMUNITY): Payer: Medicare HMO

## 2013-03-13 LAB — COMPREHENSIVE METABOLIC PANEL WITH GFR
ALT: 40 U/L — ABNORMAL HIGH (ref 0–35)
AST: 98 U/L — ABNORMAL HIGH (ref 0–37)
Albumin: 2.1 g/dL — ABNORMAL LOW (ref 3.5–5.2)
Alkaline Phosphatase: 260 U/L — ABNORMAL HIGH (ref 39–117)
BUN: 6 mg/dL (ref 6–23)
CO2: 27 meq/L (ref 19–32)
Calcium: 9.5 mg/dL (ref 8.4–10.5)
Chloride: 96 meq/L (ref 96–112)
Creatinine, Ser: 0.32 mg/dL — ABNORMAL LOW (ref 0.50–1.10)
GFR calc Af Amer: >90 mL/min
GFR calc non Af Amer: >90 mL/min
Glucose, Bld: 110 mg/dL — ABNORMAL HIGH (ref 70–99)
Potassium: 4 meq/L (ref 3.5–5.1)
Sodium: 132 meq/L — ABNORMAL LOW (ref 135–145)
Total Bilirubin: 10 mg/dL — ABNORMAL HIGH (ref 0.3–1.2)
Total Protein: 7.4 g/dL (ref 6.0–8.3)

## 2013-03-13 LAB — CBC
HCT: 40.1 % (ref 36.0–46.0)
Hemoglobin: 13.4 g/dL (ref 12.0–15.0)
MCH: 33 pg (ref 26.0–34.0)
MCHC: 33.4 g/dL (ref 30.0–36.0)
MCV: 98.8 fL (ref 78.0–100.0)
Platelets: 218 10*3/uL (ref 150–400)
RBC: 4.06 MIL/uL (ref 3.87–5.11)
RDW: 19.1 % — ABNORMAL HIGH (ref 11.5–15.5)
WBC: 9.1 10*3/uL (ref 4.0–10.5)

## 2013-03-13 LAB — PROTIME-INR
INR: 1.37 (ref 0.00–1.49)
Prothrombin Time: 16.5 s — ABNORMAL HIGH (ref 11.6–15.2)

## 2013-03-13 LAB — APTT: aPTT: 41 s — ABNORMAL HIGH (ref 24–37)

## 2013-03-13 MED ORDER — FENTANYL CITRATE 0.05 MG/ML IJ SOLN
INTRAMUSCULAR | Status: AC | PRN
  Administered 2013-03-13: 100 ug via INTRAVENOUS

## 2013-03-13 MED ORDER — MIDAZOLAM HCL 2 MG/2ML IJ SOLN
INTRAMUSCULAR | Status: AC | PRN
  Administered 2013-03-13: 2 mg via INTRAVENOUS
  Administered 2013-03-13: 1 mg via INTRAVENOUS

## 2013-03-13 MED ORDER — OXYCODONE-ACETAMINOPHEN 5-325 MG PO TABS
1.0000 | ORAL_TABLET | ORAL | Status: DC | PRN
  Administered 2013-03-13 – 2013-03-14 (×4): 1 via ORAL
  Filled 2013-03-13 (×5): qty 1

## 2013-03-13 MED ORDER — IOHEXOL 300 MG/ML  SOLN
25.0000 mL | Freq: Once | INTRAMUSCULAR | Status: AC | PRN
  Administered 2013-03-13: 25 mL

## 2013-03-13 NOTE — Progress Notes (Signed)
I agree with the above documentation, including the assessment and plan. Perc biliary drain being placed now by IR Monitor hep function panel thereafter, standard drain management/care

## 2013-03-13 NOTE — Procedures (Signed)
Interventional Radiology Procedure Note  Procedure:  1.) Left PTC 2.) Placement of externalized 50F biliary drain Complications: None Recommendations: - Maintain tube to gravity drainage - Follow CBC and serum Br - Will schedule pt to return to IR for attempt at internalization in the near future  Signed,  Sterling Big, MD Vascular & Interventional Radiologist [facility]

## 2013-03-13 NOTE — Care Management Note (Signed)
CARE MANAGEMENT NOTE 03/13/2013  Patient:  April Spence, April Spence   Account Number:  [id number]  Date Initiated:  03/13/2013  Documentation initiated by:  April Spence  Subjective/Objective Assessment:   57 yo female admitted for left biliary stent placement. Primary Care Physician:  April Corwin, MD.     Action/Plan:   Home when stable   Anticipated DC Date:     Anticipated DC Plan:  HOME/SELF CARE  In-house referral  NA      DC Planning Services  CM consult      Choice offered to / List presented to:  NA   DME arranged  NA      DME agency  NA     HH arranged  NA      HH agency  NA   Status of service:  In process, will continue to follow Medicare Important Message given?   (If response is "NO", the following Medicare IM given date fields will be blank) Date Medicare IM given:   Date Additional Medicare IM given:    Discharge Disposition:    Per UR Regulation:  Reviewed for med. necessity/level of care/duration of stay  If discussed at Long Length of Stay Meetings, dates discussed:    Comments:  03/13/13 1252 April Spence [phone] Chart reviewed for utilization of services. No needs identified at this time. PTA pt from home with spouse. No previous documentation of HH services or dme.

## 2013-03-13 NOTE — Progress Notes (Signed)
Agree with PA note.  Discussed case with Dr. Christella Hartigan.  Will place a left tube today for cholangiogram and decompression.  Will likely have to stage a second procedure for internalization.  Signed,  Sterling Big, MD Vascular & Interventional Radiologist [facility]

## 2013-03-13 NOTE — Progress Notes (Signed)
[facility] Progress Note  Subjective:  Feels ok this AM.  No new complaints other than she is hungry.  For IR procedure this afternoon.  Objective:  Vital signs in last 24 hours: Temp:  [98.1 F (36.7 C)-98.3 F (36.8 C)] 98.3 F (36.8 C) (07/03 0618) Pulse Rate:  [76-87] 87 (07/03 0618) Resp:  [18-20] 20 (07/03 0618) BP: (116-131)/(65-83) 116/65 mmHg (07/03 0618) SpO2:  [95 %-99 %] 95 % (07/03 0618) Weight:  [134 lb 11.2 oz (61.1 kg)] 134 lb 11.2 oz (61.1 kg) (07/02 1422) Last BM Date: 03/11/13 General:   Alert, Well-developed,  in NAD; jaundiced Heart:  Regular rate and rhythm; no murmurs Pulm:  CTAB.  No W/R/R. Abdomen:  Soft, non-distended.  BS present.  Mild RUQ TTP without R/R/G. Extremities:  Slight edema in B/L LE's. Neurologic:  Alert and  oriented x4;  grossly normal neurologically. Psych:  Alert and cooperative. Normal mood and affect.  Intake/Output from previous day: 07/02 0701 - 07/03 0700 In: 1080.8 [P.O.:360; I.V.:720.8] Out: 1000 [Urine:1000]  Lab Results:  Recent Labs  03/12/13 1421 03/13/13 0352  WBC 8.3 9.1  HGB 13.9 13.4  HCT 42.3 40.1  PLT 215 218   BMET  Recent Labs  03/12/13 1421 03/13/13 0352  NA 132* 132*  K 4.4 4.0  CL 97 96  CO2 28 27  GLUCOSE 93 110*  BUN 7 6  CREATININE 0.35* 0.32*  CALCIUM 9.7 9.5   LFT  Recent Labs  03/13/13 0352  PROT 7.4  ALBUMIN 2.1*  AST 98*  ALT 40*  ALKPHOS 260*  BILITOT 10.0*   PT/INR  Recent Labs  03/12/13 1421 03/13/13 0352  LABPROT 16.4* 16.5*  INR 1.36 1.37   Ct Abdomen Pelvis W Contrast  03/11/2013   *RADIOLOGY REPORT*  Clinical Data: Increasing bilirubin.  Nausea.  History of colon cancer with metastases to liver.  Radiation therapy chemotherapy. Recent biliary stent placement.  CT ABDOMEN AND PELVIS WITH CONTRAST  Technique:  Multidetector CT imaging of the abdomen and pelvis was performed following the standard protocol during bolus administration of intravenous  contrast.  Contrast: OMNIPAQUE IOHEXOL 300 MG/ML  SOLN  Comparison: ERCP of 02/27/2013.  MRI of 02/21/2013.  Most recent CT of 08/15/2012.  Findings: Lung bases:  Normal  Abdomen/pelvis:  The right liver lobe lesion described on 02/21/2013 is improved and partially obscured by the tip of the biliary stent.  Likely measures on the order of 2.2 cm on image 16/series 2.  2.6 cm of same level on the prior.  Mild cephalad right-sided intrahepatic biliary ductal dilatation has resolved.  The moderate left intrahepatic biliary ductal dilatation is similar to 02/21/2013.  For example, index intrahepatic duct measures 9 mm on image 22/series 2 versus 1.0 cm at same level on the prior exam. The common duct stent terminates proximally in the central right hepatic duct.  The obstruction involving the central left hepatic duct is separate, and measures 1.5 x 1.7 cm on image 20/series 2. No new left-sided lesions are identified.  Small splenule.  Anatomic distortion secondary to extent of spinal curvature.  Mild pancreatic atrophy.  The biliary stent terminates at the level of the ampulla.  Portal veins patent.  Hepatic veins not well evaluated.  Cholecystectomy.  Normal adrenal glands.  Normal kidneys.  Retroaortic left renal vein.  No retroperitoneal adenopathy.  Large amount of colonic stool.  Surgical changes of right hemicolectomy.  Normal small bowel without abdominal ascites.    No evidence  of omental or peritoneal disease.  No pelvic adenopathy.  Normal urinary bladder.  Probable exophytic left sided dystrophic uterine fibroid.  This is unchanged. No adnexal mass or significant free fluid.  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  IMPRESSION:  1.  Since 02/21/2013, placement of a biliary stent.  This has its cephalad portion in the right hepatic ductal system, and does not traverse the previously described central left hepatic lobe stricture.  The stricture is  similar in size and causes similar moderate left sided intrahepatic ductal dilatation. 2.  Decreased size of a right hepatic lobe metastasis, partially obscured by the tip of the biliary stent.  Ductal dilatation in this area has resolved. These results will be called to the ordering clinician or representative by the Radiologist Assistant, and communication documented in the PACS Dashboard. 3.  No evidence of extrahepatic metastatic disease. 4. Possible constipation. 5.  Distorted anatomy secondary to the extent of spinal curvature.   Original Report Authenticated By: Jeronimo Greaves, M.D.    Assessment / Plan: -Obstructive jaundice secondary to metastatic colon cancer with liver mets s/p placement of permanent biliary stent on 6/19 into right hepatic lobe. Bili increased since procedure. Patient without fevers. Will trend LFT's.  To IR for external perc biliary drain later today. -Metastatic colon cancer s/p right hemicolectomy.  -Spina bifida with severe scoliosis  -Hypothyroidism    LOS: 1 day   Milad Bublitz D.  03/13/2013, 9:08 AM  Pager number [phone]

## 2013-03-14 LAB — BASIC METABOLIC PANEL
BUN: 8 mg/dL (ref 6–23)
Creatinine, Ser: 0.53 mg/dL (ref 0.50–1.10)
GFR calc Af Amer: >90 mL/min (ref >90)
GFR calc non Af Amer: >90 mL/min (ref >90)
Potassium: 4 mEq/L (ref 3.5–5.1)

## 2013-03-14 LAB — HEPATIC FUNCTION PANEL
AST: 115 U/L — ABNORMAL HIGH (ref 0–37)
Bilirubin, Direct: 7 mg/dL — ABNORMAL HIGH (ref 0.0–0.3)
Indirect Bilirubin: 3.1 mg/dL — ABNORMAL HIGH (ref 0.3–0.9)
Total Bilirubin: 10.1 mg/dL — ABNORMAL HIGH (ref 0.3–1.2)

## 2013-03-14 MED ORDER — OXYCODONE-ACETAMINOPHEN 5-325 MG PO TABS: 1.0000 | ORAL_TABLET | ORAL | Status: DC | PRN

## 2013-03-14 NOTE — Progress Notes (Signed)
[facility] Progress Note  Subjective:  Feels pretty good.  Ate and tolerated breakfast this AM.  No complaints.  Objective:  Vital signs in last 24 hours: Temp:  [97.6 F (36.4 C)-98.8 F (37.1 C)] 98.5 F (36.9 C) (07/04 0400) Pulse Rate:  [71-80] 80 (07/04 0400) Resp:  [13-21] 16 (07/04 0400) BP: (103-150)/(56-86) 113/71 mmHg (07/04 0400) SpO2:  [96 %-100 %] 97 % (07/04 0400) Last BM Date: 03/12/13 General:   Alert, Well-developed, in NAD; jaundiced. Heart:  Regular rate and rhythm; no murmurs Pulm:  CTAB.  No W/R/R. Abdomen:  Soft, nondistended. Normal bowel sounds.  Mild RUQ TTP without R/R/G.  Drain noted with bilious fluid. Extremities:  Without edema. Neurologic:  Alert and  oriented x4;  grossly normal neurologically. Psych:  Alert and cooperative. Normal mood and affect.  Intake/Output from previous day: 07/03 0701 - 07/04 0700 In: 1666.7 [P.O.:480; I.V.:1186.7] Out: 1025 [Urine:950; Drains:75]  Lab Results:  Recent Labs  03/12/13 1421 03/13/13 0352  WBC 8.3 9.1  HGB 13.9 13.4  HCT 42.3 40.1  PLT 215 218   BMET  Recent Labs  03/12/13 1421 03/13/13 0352 03/14/13 0417  NA 132* 132* 130*  K 4.4 4.0 4.0  CL 97 96 95*  CO2 28 27 28   GLUCOSE 93 110* 141*  BUN 7 6 8   CREATININE 0.35* 0.32* 0.53  CALCIUM 9.7 9.5 9.6   LFT  Recent Labs  03/14/13 0417  PROT 7.3  ALBUMIN 2.1*  AST 115*  ALT 42*  ALKPHOS 259*  BILITOT 10.1*  BILIDIR 7.0*  IBILI 3.1*   PT/INR  Recent Labs  03/12/13 1421 03/13/13 0352  LABPROT 16.4* 16.5*  INR 1.36 1.37   Ir Perc Biliary Drain-ext Only  03/13/2013   *RADIOLOGY REPORT*  IR PERCUTANEOUS BILIARY DRAIN EXTERNAL ONLY; IR ULTRASOUND GUIDANCE; IR PERCUTANEOUS TRANSHEPATIC CHOLANGIOGRAM  Date: 03/13/2013  Clinical History: 56-year female with metastatic colorectal cancer and malignant obstructed jaundice. She has a permanent no biliary stent in her right biliary tree.  However the left biliary tree  remains completely obstructed by a central mass lesion. She requires percutaneous transhepatic cholangiogram and biliary drain placement for decompression.  Procedures Performed: 1. Percutaneous transhepatic cholangiogram 2.  Placement of an 8-French externalized biliary drain  Interventional Radiologist:  Sterling Big, MD  Sedation: Moderate (conscious) sedation was used.  Three mg Versed, 100 mcg Fentanyl were administered intravenously.  The patient's vital signs were monitored continuously by radiology nursing throughout the procedure.  Sedation Time: 30 minutes  Fluoroscopy time: 8 minutes 24 seconds  Contrast volume: 25 ml Omnipaque-300 administered into the biliary tree  PROCEDURE/FINDINGS:   Informed consent was obtained from the patient following explanation of the procedure, risks, benefits and alternatives. The patient understands, agrees and consents for the procedure. All questions were addressed. A time out was performed.  Maximal barrier sterile technique utilized including caps, mask, sterile gowns, sterile gloves, large sterile drape, hand hygiene, and  betadine skin prep.  The left upper quadrant was interrogated with ultrasound.  Dilated biliary ducts were successfully identified.  Anesthesia was attained by infiltration of 1% lidocaine.  Under direct sonographic guidance, a 21 gauge Accustick needle was carefully advanced into the liver used to puncture a left biliary radicle.  A percutaneous transhepatic cholangiogram was then performed. There is severe intrahepatic biliary ductal dilatation.  The left main biliary ducts appears completely occluded.  There is no communication of the biliary tree with the previously placed metal stent.  The  initial access was slightly central.  Therefore, using a combination of ultrasound and fluoroscopic guidance, a second percutaneous biliary access was obtained again using a 21-gauge micropuncture.  The tertiary biliary radicle in the inferior left  hepatic lobe (segment three) was successfully punctured.  The Accustick sheath was advanced over a wire. Attempts were made to identify the main left hepatic duct using a 4-French angled glide catheter and glide wire through the Accustick sheath.  This was ultimately unsuccessful. And 8-French Cook all-purpose drainage catheter was therefore advanced over a wire and positioned within the central left biliary ducts.  The catheter was secured to the skin with O Prolene suture.  Clear minimally viscous bile was readily aspirated through the tube.  The tube was connected to gravity drainage.  There was no immediate complication, the patient tolerated the procedure well.  IMPRESSION:  1.  Percutaneous transhepatic cholangiogram demonstrates marked dilatation of the left biliary system with complete occlusion of the left main biliary duct.  The segment II, III, 4A and 4B ducts are all dilated.  2. Placement of a 8-French percutaneous biliary drain positioned within the central left hepatic ducts.  The drain was left external gravity drainage.  3.  Plan:  Recommend continued external drainage and decompression of the left biliary system.  The patient can return to interventional radiology in 1 - 2 weeks for another attempt at internalization of the percutaneous biliary drain.  Signed,  Sterling Big, MD Vascular & Interventional Radiologist [facility]   Original Report Authenticated By: Malachy Moan, M.D.   Ir Ptc  03/13/2013   *RADIOLOGY REPORT*  IR PERCUTANEOUS BILIARY DRAIN EXTERNAL ONLY; IR ULTRASOUND GUIDANCE; IR PERCUTANEOUS TRANSHEPATIC CHOLANGIOGRAM  Date: 03/13/2013  Clinical History: 56-year female with metastatic colorectal cancer and malignant obstructed jaundice. She has a permanent no biliary stent in her right biliary tree.  However the left biliary tree remains completely obstructed by a central mass lesion. She requires percutaneous transhepatic cholangiogram and biliary drain  placement for decompression.  Procedures Performed: 1. Percutaneous transhepatic cholangiogram 2.  Placement of an 8-French externalized biliary drain  Interventional Radiologist:  Sterling Big, MD  Sedation: Moderate (conscious) sedation was used.  Three mg Versed, 100 mcg Fentanyl were administered intravenously.  The patient's vital signs were monitored continuously by radiology nursing throughout the procedure.  Sedation Time: 30 minutes  Fluoroscopy time: 8 minutes 24 seconds  Contrast volume: 25 ml Omnipaque-300 administered into the biliary tree  PROCEDURE/FINDINGS:   Informed consent was obtained from the patient following explanation of the procedure, risks, benefits and alternatives. The patient understands, agrees and consents for the procedure. All questions were addressed. A time out was performed.  Maximal barrier sterile technique utilized including caps, mask, sterile gowns, sterile gloves, large sterile drape, hand hygiene, and  betadine skin prep.  The left upper quadrant was interrogated with ultrasound.  Dilated biliary ducts were successfully identified.  Anesthesia was attained by infiltration of 1% lidocaine.  Under direct sonographic guidance, a 21 gauge Accustick needle was carefully advanced into the liver used to puncture a left biliary radicle.  A percutaneous transhepatic cholangiogram was then performed. There is severe intrahepatic biliary ductal dilatation.  The left main biliary ducts appears completely occluded.  There is no communication of the biliary tree with the previously placed metal stent.  The initial access was slightly central.  Therefore, using a combination of ultrasound and fluoroscopic guidance, a second percutaneous biliary access was obtained again using a 21-gauge micropuncture.  The tertiary biliary radicle in the inferior left hepatic lobe (segment three) was successfully punctured.  The Accustick sheath was advanced over a wire. Attempts were made to  identify the main left hepatic duct using a 4-French angled glide catheter and glide wire through the Accustick sheath.  This was ultimately unsuccessful. And 8-French Cook all-purpose drainage catheter was therefore advanced over a wire and positioned within the central left biliary ducts.  The catheter was secured to the skin with O Prolene suture.  Clear minimally viscous bile was readily aspirated through the tube.  The tube was connected to gravity drainage.  There was no immediate complication, the patient tolerated the procedure well.  IMPRESSION:  1.  Percutaneous transhepatic cholangiogram demonstrates marked dilatation of the left biliary system with complete occlusion of the left main biliary duct.  The segment II, III, 4A and 4B ducts are all dilated.  2. Placement of a 8-French percutaneous biliary drain positioned within the central left hepatic ducts.  The drain was left external gravity drainage.  3.  Plan:  Recommend continued external drainage and decompression of the left biliary system.  The patient can return to interventional radiology in 1 - 2 weeks for another attempt at internalization of the percutaneous biliary drain.  Signed,  Sterling Big, MD Vascular & Interventional Radiologist [facility] Radiology   Original Report Authenticated By: Malachy Moan, M.D.   Ir US Guide Bx Asp/drain  03/13/2013   *RADIOLOGY REPORT*  IR PERCUTANEOUS BILIARY DRAIN EXTERNAL ONLY; IR ULTRASOUND GUIDANCE; IR PERCUTANEOUS TRANSHEPATIC CHOLANGIOGRAM  Date: 03/13/2013  Clinical History: 56-year female with metastatic colorectal cancer and malignant obstructed jaundice. She has a permanent no biliary stent in her right biliary tree.  However the left biliary tree remains completely obstructed by a central mass lesion. She requires percutaneous transhepatic cholangiogram and biliary drain placement for decompression.  Procedures Performed: 1. Percutaneous transhepatic cholangiogram 2.  Placement of an  8-French externalized biliary drain  Interventional Radiologist:  Sterling Big, MD  Sedation: Moderate (conscious) sedation was used.  Three mg Versed, 100 mcg Fentanyl were administered intravenously.  The patient's vital signs were monitored continuously by radiology nursing throughout the procedure.  Sedation Time: 30 minutes  Fluoroscopy time: 8 minutes 24 seconds  Contrast volume: 25 ml Omnipaque-300 administered into the biliary tree  PROCEDURE/FINDINGS:   Informed consent was obtained from the patient following explanation of the procedure, risks, benefits and alternatives. The patient understands, agrees and consents for the procedure. All questions were addressed. A time out was performed.  Maximal barrier sterile technique utilized including caps, mask, sterile gowns, sterile gloves, large sterile drape, hand hygiene, and  betadine skin prep.  The left upper quadrant was interrogated with ultrasound.  Dilated biliary ducts were successfully identified.  Anesthesia was attained by infiltration of 1% lidocaine.  Under direct sonographic guidance, a 21 gauge Accustick needle was carefully advanced into the liver used to puncture a left biliary radicle.  A percutaneous transhepatic cholangiogram was then performed. There is severe intrahepatic biliary ductal dilatation.  The left main biliary ducts appears completely occluded.  There is no communication of the biliary tree with the previously placed metal stent.  The initial access was slightly central.  Therefore, using a combination of ultrasound and fluoroscopic guidance, a second percutaneous biliary access was obtained again using a 21-gauge micropuncture.  The tertiary biliary radicle in the inferior left hepatic lobe (segment three) was successfully punctured.  The Accustick sheath was advanced over a wire. Attempts  were made to identify the main left hepatic duct using a 4-French angled glide catheter and glide wire through the Accustick sheath.   This was ultimately unsuccessful. And 8-French Cook all-purpose drainage catheter was therefore advanced over a wire and positioned within the central left biliary ducts.  The catheter was secured to the skin with O Prolene suture.  Clear minimally viscous bile was readily aspirated through the tube.  The tube was connected to gravity drainage.  There was no immediate complication, the patient tolerated the procedure well.  IMPRESSION:  1.  Percutaneous transhepatic cholangiogram demonstrates marked dilatation of the left biliary system with complete occlusion of the left main biliary duct.  The segment II, III, 4A and 4B ducts are all dilated.  2. Placement of a 8-French percutaneous biliary drain positioned within the central left hepatic ducts.  The drain was left external gravity drainage.  3.  Plan:  Recommend continued external drainage and decompression of the left biliary system.  The patient can return to interventional radiology in 1 - 2 weeks for another attempt at internalization of the percutaneous biliary drain.  Signed,  Sterling Big, MD Vascular & Interventional Radiologist [facility] Radiology   Original Report Authenticated By: Malachy Moan, M.D.    Assessment / Plan: -Obstructive jaundice secondary to metastatic colon cancer with liver mets s/p placement of permanent biliary stent on 6/19 into right hepatic lobe.  IR placed left external perc biliary drain yesterday, 7/3.  LFT's unchanged today, but drain with good output.  Will discharge home today. -Metastatic colon cancer s/p right hemicolectomy.  -Spina bifida with severe scoliosis  -Hypothyroidism     LOS: 2 days   ZEHR, JESSICA D.  03/14/2013, 8:46 AM  Pager number [phone]

## 2013-03-14 NOTE — Care Management Note (Signed)
CM spoke with patient and spouse at the bedside concerning discharge planning. MD order for [facility]. Pt offered choice for [facility] services in [facility]. Per pt choice [facility] to provide [facility] services. [facility] rep Lanae Crumbly notified of referral. Start of care within 24-48 hours. RN to provide drain flush education prior to discharge. Pt's spouse to assist in home care.    Roxy Manns Jeramiah Mccaughey,RN,BSN [phone]

## 2013-03-14 NOTE — Progress Notes (Signed)
Interventional Radiology Progress Note  Date: 03/14/13  HPI: 57 yo female with malignant obstructed jaundice 2/2 metastatic CRC now POD#1 s/p placement of a right externalized percutaneous biliary drain.   Subjective: April Spence is doing well this morning.  She reports minimal discomfort and denies nausea, vomiting, fever and chills.  Her biliary output has been good and minimally bloody.  No bleeding or draining from the tube insertion site.  Objective: Filed Vitals:   03/14/13 0400  BP: 113/71  Pulse: 80  Temp: 98.5 F (36.9 C)  Resp: 16   Biliary drain output: 75mL overnight with more in bag this am Gen: WNWD, in NAD Abd: SOft, NT/ND.  Dressing C/D/I  A/P:  Doing well POD#1 s/p left external PBD placement.  Bilirubin essentially stable this am, will expect a continued trend downward with continued drainage.  No signs or symptoms of procedural complication.   - Continued flushing 2-3x daily - Dressing changes PRN for soilage and every other day - From a procedural standpoint, there are no IR related barriers to discharge.  Discharge per primary team - Continue to follow Br and electrolytes with repletion as needed while draiming externally - Patient can return to IR in the next 1-4 weeks for attempted internalization of her drain  Signed,  Sterling Big, MD Vascular & Interventional Radiologist [facility] Pager: [phone] Cel: [phone]

## 2013-03-14 NOTE — Discharge Summary (Signed)
[facility] Discharge Summary  Name: April Spence MRN: [id number] DOB: [birth year] 57 y.o. PCP:  Nadean Corwin, MD  Date of Admission: 03/12/2013  1:06 PM Date of Discharge: 03/14/2013 Attending Physician: Beverley Fiedler, MD  Discharge Diagnosis: Malignant obstructive Jaundice      Metastatic colon cancer to the liver   Consultations:  Interventional Radiology  Procedures Performed:  Ct Abdomen Pelvis W Contrast  03/11/2013   *RADIOLOGY REPORT*  Clinical Data: Increasing bilirubin.  Nausea.  History of colon cancer with metastases to liver.  Radiation therapy chemotherapy. Recent biliary stent placement.  CT ABDOMEN AND PELVIS WITH CONTRAST  Technique:  Multidetector CT imaging of the abdomen and pelvis was performed following the standard protocol during bolus administration of intravenous contrast.  Contrast: OMNIPAQUE IOHEXOL 300 MG/ML  SOLN  Comparison: ERCP of 02/27/2013.  MRI of 02/21/2013.  Most recent CT of 08/15/2012.  Findings: Lung bases:  Normal  Abdomen/pelvis:  The right liver lobe lesion described on 02/21/2013 is improved and partially obscured by the tip of the biliary stent.  Likely measures on the order of 2.2 cm on image 16/series 2.  2.6 cm of same level on the prior.  Mild cephalad right-sided intrahepatic biliary ductal dilatation has resolved.  The moderate left intrahepatic biliary ductal dilatation is similar to 02/21/2013.  For example, index intrahepatic duct measures 9 mm on image 22/series 2 versus 1.0 cm at same level on the prior exam. The common duct stent terminates proximally in the central right hepatic duct.  The obstruction involving the central left hepatic duct is separate, and measures 1.5 x 1.7 cm on image 20/series 2. No new left-sided lesions are identified.  Small splenule.  Anatomic distortion secondary to extent of spinal curvature.  Mild pancreatic atrophy.  The biliary stent terminates at the level of the ampulla.  Portal veins  patent.  Hepatic veins not well evaluated.  Cholecystectomy.  Normal adrenal glands.  Normal kidneys.  Retroaortic left renal vein.  No retroperitoneal adenopathy.  Large amount of colonic stool.  Surgical changes of right hemicolectomy.  Normal small bowel without abdominal ascites.    No evidence of omental or peritoneal disease.  No pelvic adenopathy.  Normal urinary bladder.  Probable exophytic left sided dystrophic uterine fibroid.  This is unchanged. No adnexal mass or significant free fluid.  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  IMPRESSION:  1.  Since 02/21/2013, placement of a biliary stent.  This has its cephalad portion in the right hepatic ductal system, and does not traverse the previously described central left hepatic lobe stricture.  The stricture is similar in size and causes similar moderate left sided intrahepatic ductal dilatation. 2.  Decreased size of a right hepatic lobe metastasis, partially obscured by the tip of the biliary stent.  Ductal dilatation in this area has resolved. These results will be called to the ordering clinician or representative by the Radiologist Assistant, and communication documented in the PACS Dashboard. 3.  No evidence of extrahepatic metastatic disease. 4. Possible constipation. 5.  Distorted anatomy secondary to the extent of spinal curvature.   Original Report Authenticated By: Jeronimo Greaves, M.D.   Ir Perc Biliary Drain-ext Only  03/13/2013   *RADIOLOGY REPORT*  IR PERCUTANEOUS BILIARY DRAIN EXTERNAL ONLY; IR ULTRASOUND GUIDANCE; IR PERCUTANEOUS TRANSHEPATIC CHOLANGIOGRAM  Date: 03/13/2013  Clinical History: 56-year female with metastatic colorectal cancer and malignant obstructed jaundice. She has a permanent no biliary stent in her right biliary tree.  However the left biliary tree remains completely obstructed by a central mass lesion. She requires percutaneous transhepatic cholangiogram and biliary  drain placement for decompression.  Procedures Performed: 1. Percutaneous transhepatic cholangiogram 2.  Placement of an 8-French externalized biliary drain  Interventional Radiologist:  Sterling Big, MD  Sedation: Moderate (conscious) sedation was used.  Three mg Versed, 100 mcg Fentanyl were administered intravenously.  The patient's vital signs were monitored continuously by radiology nursing throughout the procedure.  Sedation Time: 30 minutes  Fluoroscopy time: 8 minutes 24 seconds  Contrast volume: 25 ml Omnipaque-300 administered into the biliary tree  PROCEDURE/FINDINGS:   Informed consent was obtained from the patient following explanation of the procedure, risks, benefits and alternatives. The patient understands, agrees and consents for the procedure. All questions were addressed. A time out was performed.  Maximal barrier sterile technique utilized including caps, mask, sterile gowns, sterile gloves, large sterile drape, hand hygiene, and  betadine skin prep.  The left upper quadrant was interrogated with ultrasound.  Dilated biliary ducts were successfully identified.  Anesthesia was attained by infiltration of 1% lidocaine.  Under direct sonographic guidance, a 21 gauge Accustick needle was carefully advanced into the liver used to puncture a left biliary radicle.  A percutaneous transhepatic cholangiogram was then performed. There is severe intrahepatic biliary ductal dilatation.  The left main biliary ducts appears completely occluded.  There is no communication of the biliary tree with the previously placed metal stent.  The initial access was slightly central.  Therefore, using a combination of ultrasound and fluoroscopic guidance, a second percutaneous biliary access was obtained again using a 21-gauge micropuncture.  The tertiary biliary radicle in the inferior left hepatic lobe (segment three) was successfully punctured.  The Accustick sheath was advanced over a wire. Attempts were made  to identify the main left hepatic duct using a 4-French angled glide catheter and glide wire through the Accustick sheath.  This was ultimately unsuccessful. And 8-French Cook all-purpose drainage catheter was therefore advanced over a wire and positioned within the central left biliary ducts.  The catheter was secured to the skin with O Prolene suture.  Clear minimally viscous bile was readily aspirated through the tube.  The tube was connected to gravity drainage.  There was no immediate complication, the patient tolerated the procedure well.  IMPRESSION:  1.  Percutaneous transhepatic cholangiogram demonstrates marked dilatation of the left biliary system with complete occlusion of the left main biliary duct.  The segment II, III, 4A and 4B ducts are all dilated.  2. Placement of a 8-French percutaneous biliary drain positioned within the central left hepatic ducts.  The drain was left external gravity drainage.  3.  Plan:  Recommend continued external drainage and decompression of the left biliary system.  The patient can return to interventional radiology in 1 - 2 weeks for another attempt at internalization of the percutaneous biliary drain.  Signed,  Sterling Big, MD Vascular & Interventional Radiologist [facility] Radiology   Original Report Authenticated By: Malachy Moan, M.D.   Mr Liver W Wo Contrast  02/21/2013   *RADIOLOGY REPORT*  Clinical Data:  Metastatic colon cancer.  MRI ABDOMEN WITHOUT AND WITH CONTRAST  Technique:  Multiplanar multisequence MR imaging of the abdomen was performed both before and after the administration of intravenous contrast.  Contrast: 13mL MULTIHANCE GADOBENATE DIMEGLUMINE 529 MG/ML IV SOLN  Comparison:  10/10/2012  Findings:  Right hepatic lobe metastatic lesion measures 2.6 x 2.2 cm, image 36/12/30.  Previously this measured  4.0 x 3.1 cm. Lesion within the medial segment of left hepatic lobe is not significantly changed in size from previous exam measuring 1  cm.  However, however, there is now involvement of the central left hepatic lobe bile duct resulting in significant left hepatic lobe ductal dilatation.  Stable caliber of the common bile duct  Normal appearance of the pancreas.  The spleen appears normal.  The adrenal glands are both unremarkable.  Normal appearance of both kidneys.  No significant upper abdominal adenopathy.  No free fluid identified.  IMPRESSION: 1.  The dominant lesion in the right hepatic lobe has decreased in size from previous exam. 2.  Medial segment of left hepatic lobe lesion is not significantly changed in size, but is now obstructing the left hepatic lobe bile ducts.   Original Report Authenticated By: Signa Kell, M.D.   Ir Ptc  03/13/2013   *RADIOLOGY REPORT*  IR PERCUTANEOUS BILIARY DRAIN EXTERNAL ONLY; IR ULTRASOUND GUIDANCE; IR PERCUTANEOUS TRANSHEPATIC CHOLANGIOGRAM  Date: 03/13/2013  Clinical History: 56-year female with metastatic colorectal cancer and malignant obstructed jaundice. She has a permanent no biliary stent in her right biliary tree.  However the left biliary tree remains completely obstructed by a central mass lesion. She requires percutaneous transhepatic cholangiogram and biliary drain placement for decompression.  Procedures Performed: 1. Percutaneous transhepatic cholangiogram 2.  Placement of an 8-French externalized biliary drain  Interventional Radiologist:  Sterling Big, MD  Sedation: Moderate (conscious) sedation was used.  Three mg Versed, 100 mcg Fentanyl were administered intravenously.  The patient's vital signs were monitored continuously by radiology nursing throughout the procedure.  Sedation Time: 30 minutes  Fluoroscopy time: 8 minutes 24 seconds  Contrast volume: 25 ml Omnipaque-300 administered into the biliary tree  PROCEDURE/FINDINGS:   Informed consent was obtained from the patient following explanation of the procedure, risks, benefits and alternatives. The patient understands,  agrees and consents for the procedure. All questions were addressed. A time out was performed.  Maximal barrier sterile technique utilized including caps, mask, sterile gowns, sterile gloves, large sterile drape, hand hygiene, and  betadine skin prep.  The left upper quadrant was interrogated with ultrasound.  Dilated biliary ducts were successfully identified.  Anesthesia was attained by infiltration of 1% lidocaine.  Under direct sonographic guidance, a 21 gauge Accustick needle was carefully advanced into the liver used to puncture a left biliary radicle.  A percutaneous transhepatic cholangiogram was then performed. There is severe intrahepatic biliary ductal dilatation.  The left main biliary ducts appears completely occluded.  There is no communication of the biliary tree with the previously placed metal stent.  The initial access was slightly central.  Therefore, using a combination of ultrasound and fluoroscopic guidance, a second percutaneous biliary access was obtained again using a 21-gauge micropuncture.  The tertiary biliary radicle in the inferior left hepatic lobe (segment three) was successfully punctured.  The Accustick sheath was advanced over a wire. Attempts were made to identify the main left hepatic duct using a 4-French angled glide catheter and glide wire through the Accustick sheath.  This was ultimately unsuccessful. And 8-French Cook all-purpose drainage catheter was therefore advanced over a wire and positioned within the central left biliary ducts.  The catheter was secured to the skin with O Prolene suture.  Clear minimally viscous bile was readily aspirated through the tube.  The tube was connected to gravity drainage.  There was no immediate complication, the patient tolerated the procedure well.  IMPRESSION:  1.  Percutaneous  transhepatic cholangiogram demonstrates marked dilatation of the left biliary system with complete occlusion of the left main biliary duct.  The segment II,  III, 4A and 4B ducts are all dilated.  2. Placement of a 8-French percutaneous biliary drain positioned within the central left hepatic ducts.  The drain was left external gravity drainage.  3.  Plan:  Recommend continued external drainage and decompression of the left biliary system.  The patient can return to interventional radiology in 1 - 2 weeks for another attempt at internalization of the percutaneous biliary drain.  Signed,  Sterling Big, MD Vascular & Interventional Radiologist [facility] Radiology   Original Report Authenticated By: Malachy Moan, M.D.   Ir US Guide Bx Asp/drain  03/13/2013   *RADIOLOGY REPORT*  IR PERCUTANEOUS BILIARY DRAIN EXTERNAL ONLY; IR ULTRASOUND GUIDANCE; IR PERCUTANEOUS TRANSHEPATIC CHOLANGIOGRAM  Date: 03/13/2013  Clinical History: 56-year female with metastatic colorectal cancer and malignant obstructed jaundice. She has a permanent no biliary stent in her right biliary tree.  However the left biliary tree remains completely obstructed by a central mass lesion. She requires percutaneous transhepatic cholangiogram and biliary drain placement for decompression.  Procedures Performed: 1. Percutaneous transhepatic cholangiogram 2.  Placement of an 8-French externalized biliary drain  Interventional Radiologist:  Sterling Big, MD  Sedation: Moderate (conscious) sedation was used.  Three mg Versed, 100 mcg Fentanyl were administered intravenously.  The patient's vital signs were monitored continuously by radiology nursing throughout the procedure.  Sedation Time: 30 minutes  Fluoroscopy time: 8 minutes 24 seconds  Contrast volume: 25 ml Omnipaque-300 administered into the biliary tree  PROCEDURE/FINDINGS:   Informed consent was obtained from the patient following explanation of the procedure, risks, benefits and alternatives. The patient understands, agrees and consents for the procedure. All questions were addressed. A time out was performed.  Maximal barrier  sterile technique utilized including caps, mask, sterile gowns, sterile gloves, large sterile drape, hand hygiene, and  betadine skin prep.  The left upper quadrant was interrogated with ultrasound.  Dilated biliary ducts were successfully identified.  Anesthesia was attained by infiltration of 1% lidocaine.  Under direct sonographic guidance, a 21 gauge Accustick needle was carefully advanced into the liver used to puncture a left biliary radicle.  A percutaneous transhepatic cholangiogram was then performed. There is severe intrahepatic biliary ductal dilatation.  The left main biliary ducts appears completely occluded.  There is no communication of the biliary tree with the previously placed metal stent.  The initial access was slightly central.  Therefore, using a combination of ultrasound and fluoroscopic guidance, a second percutaneous biliary access was obtained again using a 21-gauge micropuncture.  The tertiary biliary radicle in the inferior left hepatic lobe (segment three) was successfully punctured.  The Accustick sheath was advanced over a wire. Attempts were made to identify the main left hepatic duct using a 4-French angled glide catheter and glide wire through the Accustick sheath.  This was ultimately unsuccessful. And 8-French Cook all-purpose drainage catheter was therefore advanced over a wire and positioned within the central left biliary ducts.  The catheter was secured to the skin with O Prolene suture.  Clear minimally viscous bile was readily aspirated through the tube.  The tube was connected to gravity drainage.  There was no immediate complication, the patient tolerated the procedure well.  IMPRESSION:  1.  Percutaneous transhepatic cholangiogram demonstrates marked dilatation of the left biliary system with complete occlusion of the left main biliary duct.  The segment II, III, 4A  and 4B ducts are all dilated.  2. Placement of a 8-French percutaneous biliary drain positioned within the  central left hepatic ducts.  The drain was left external gravity drainage.  3.  Plan:  Recommend continued external drainage and decompression of the left biliary system.  The patient can return to interventional radiology in 1 - 2 weeks for another attempt at internalization of the percutaneous biliary drain.  Signed,  Sterling Big, MD Vascular & Interventional Radiologist [facility] Radiology   Original Report Authenticated By: Malachy Moan, M.D.   Dg Ercp  02/27/2013   *RADIOLOGY REPORT*  Clinical Data: History of metastatic colon carcinoma with liver metastases, biliary obstruction and jaundice.  ERCP  Comparison:  None.  Technique:  Multiple spot images obtained with the fluoroscopic device and submitted for interpretation post-procedure.  ERCP was performed by Dr. Christella Hartigan.  Findings: Imaging during the endoscopic procedure demonstrates cannulation of the common bile duct.  Injection shows evidence of irregular and dilated biliary ducts in the left lobe of the liver with a focal structure in the liver at the level of the central left duct.  Balloon dilatation was performed of the central segment of the left intrahepatic bile duct.  A self-expanding noncovered metallic stent was then placed extending to the left lobe of the liver and across the stricture to the level of the common bile duct.  The stent shows normal patency.  IMPRESSION: Imaging shows evidence of left intrahepatic biliary ductal obstruction due to a stricture of the central left intrahepatic bile duct.  This was treated with balloon dilatation and placement of a self-expanding noncovered metal stent.  These images were submitted for radiologic interpretation only. Please see the procedural report for the amount of contrast and the fluoroscopy time utilized.   Original Report Authenticated By: Irish Lack, M.D.    GI Procedures: None  History/Physical Exam:  See Admission H&P  Admission HPI: Patient has obstructive jaundice  from metastatic colon cancer to the liver.  Had ERCP with stent placed in right hepatic biliary tree on 6/19.  Follow-up labs the week of 6/30 showed increasing bilirubin.  Patient was admitted to GI service in order to have an IR consult and have a external percutaneous biliary drain placed in the left hepatic biliary tree.  She underwent that procedure on 7/3 and tolerated it well.  Drain was seen with bilious fluid and there were no complications from the procedure when she was seen in follow-up by IR on 7/4.  Patient was tolerating diet at discharge.  Her oxycodone was switched to every 4 hours instead of every 6 hours since that medication works well for her pain; she did not require IV pain medication with this regimen and said that the oxycodone worked better anyway.  LFT's at discharge were unchanged, but once again, drain was working well.  Patient and husband were agreeable with discharge.        Discharge Vitals:  BP 113/71  Pulse 80  Temp(Src) 98.5 F (36.9 C) (Oral)  Resp 16  Ht 5' (1.524 m)  Wt 134 lb 11.2 oz (61.1 kg)  BMI 26.31 kg/m2  SpO2 97%  Discharge Labs:  Results for orders placed during the hospital encounter of 03/12/13 (from the past 24 hour(s))  HEPATIC FUNCTION PANEL     Status: Abnormal   Collection Time    03/14/13  4:17 AM      Result Value Range   Total Protein 7.3  6.0 - 8.3 g/dL  Albumin 2.1 (*) 3.5 - 5.2 g/dL   AST 161 (*) 0 - 37 U/L   ALT 42 (*) 0 - 35 U/L   Alkaline Phosphatase 259 (*) 39 - 117 U/L   Total Bilirubin 10.1 (*) 0.3 - 1.2 mg/dL   Bilirubin, Direct 7.0 (*) 0.0 - 0.3 mg/dL   Indirect Bilirubin 3.1 (*) 0.3 - 0.9 mg/dL  BASIC METABOLIC PANEL     Status: Abnormal   Collection Time    03/14/13  4:17 AM      Result Value Range   Sodium 130 (*) 135 - 145 mEq/L   Potassium 4.0  3.5 - 5.1 mEq/L   Chloride 95 (*) 96 - 112 mEq/L   CO2 28  19 - 32 mEq/L   Glucose, Bld 141 (*) 70 - 99 mg/dL   BUN 8  6 - 23 mg/dL   Creatinine, Ser 0.96  0.50 -  1.10 mg/dL   Calcium 9.6  8.4 - 04.5 mg/dL   GFR calc non Af Amer >90  >90 mL/min   GFR calc Af Amer >90  >90 mL/min    Disposition and follow-up:   April Spence was discharged from [facility] in stable condition.    Follow-up Appointments:     Discharge Orders   Future Appointments Provider Department Dept Phone   03/18/2013 8:45 AM Dava Najjar [facility] [phone]   03/18/2013 9:15 AM Rana Snare, NP [place] [facility] [phone]   03/18/2013 10:30 AM [facility] [place] [facility] [phone]   03/20/2013 12:00 PM [facility] Flush Nurse [place] [facility] [phone]   Future Orders Complete By Expires     Call MD for:  redness, tenderness, or signs of infection (pain, swelling, bleeding, redness, odor or green/yellow discharge around incision site)  As directed     Call MD for:  temperature >100.5  As directed     Change dressing (specify)  As directed     Comments:      Dressing change:  Every other day or when soiled using supplies that were provided to you.    Discharge instructions  As directed     Comments:      Home health services should have provided you with education and supplies to flush your drain and change your dressings.  Dressings should be changed every other day or when soiled.    Resume previous diet  As directed        Discharge Medications:   Medication List         amitriptyline 25 MG tablet  Commonly known as:  ELAVIL  Take 25 mg by mouth at bedtime.     chlorhexidine 0.12 % solution  Commonly known as:  PERIDEX  Use as directed 15 mLs in the mouth or throat 2 (two) times daily.     diphenoxylate-atropine 2.5-0.025 MG per tablet  Commonly known as:  LOMOTIL  Take 1 tablet by mouth 4 (four) times daily as needed for diarrhea or loose stools.     fenofibrate micronized 134 MG capsule  Commonly known as:  LOFIBRA   Take 134 mg by mouth daily.     FLUoxetine 20 MG capsule  Commonly known as:  PROZAC  Take 20 mg by mouth every morning.     levothyroxine 150 MCG tablet  Commonly known as:  SYNTHROID, LEVOTHROID  Take 150 mcg by mouth daily before breakfast.  lidocaine-prilocaine cream  Commonly known as:  EMLA  Apply topically as needed. APPLY TO PORTACATH SITE 1-2 HOURS PRIOR TO USE     LORazepam 0.5 MG tablet  Commonly known as:  ATIVAN  Take 1 tablet (0.5 mg total) by mouth every 12 (twelve) hours as needed for anxiety.     magic mouthwash Soln  Take 10 mLs by mouth 4 (four) times daily as needed. Swish and spit     nystatin 100000 UNIT/GM Powd  Apply 1 g topically daily as needed (for rash). In summer on irritated areas     ondansetron 8 MG tablet  Commonly known as:  ZOFRAN  Take 1 tablet (8 mg total) by mouth every 12 (twelve) hours as needed for nausea.     oxyCODONE-acetaminophen 5-325 MG per tablet  Commonly known as:  PERCOCET/ROXICET  Take 1 tablet by mouth every 4 (four) hours as needed.     PRESCRIPTION MEDICATION  Pt gets chemotherapy at [facility]. Last treatment was on 02-11-13. Pt missed last treatment due to endo procedure. Followed by Dr Truett Perna     prochlorperazine 10 MG tablet  Commonly known as:  COMPAZINE  Take 10 mg by mouth every 6 (six) hours as needed.     propranolol 80 MG tablet  Commonly known as:  INDERAL  Take 80 mg by mouth every morning.     traMADol-acetaminophen 37.5-325 MG per tablet  Commonly known as:  ULTRACET  Take 1 tablet by mouth every 6 (six) hours as needed for pain.     Vitamin D-3 5000 UNITS Tabs  Take 5,000 mg by mouth 2 (two) times daily.        SignedCristi Loron, JESSICA D. 03/14/2013, 9:38 AM

## 2013-03-14 NOTE — Progress Notes (Signed)
Subjective: L perc biliary drain placed 7/3 Doing well Plan dc today per pt  Objective: Vital signs in last 24 hours: Temp:  [97.6 F (36.4 C)-98.8 F (37.1 C)] 98.5 F (36.9 C) (07/04 0400) Pulse Rate:  [71-80] 80 (07/04 0400) Resp:  [13-21] 16 (07/04 0400) BP: (103-150)/(56-86) 113/71 mmHg (07/04 0400) SpO2:  [96 %-100 %] 97 % (07/04 0400) Last BM Date: 03/12/13  Intake/Output from previous day: 07/03 0701 - 07/04 0700 In: 1666.7 [P.O.:480; I.V.:1186.7] Out: 1025 [Urine:950; Drains:75] Intake/Output this shift:    PE:  Afeb; vss Bili drain intact Sl tender; clean and dry Output 75 cc yesterday 75 cc in bag- bile Good spirits  Lab Results:   Recent Labs  03/12/13 1421 03/13/13 0352  WBC 8.3 9.1  HGB 13.9 13.4  HCT 42.3 40.1  PLT 215 218   BMET  Recent Labs  03/13/13 0352 03/14/13 0417  NA 132* 130*  K 4.0 4.0  CL 96 95*  CO2 27 28  GLUCOSE 110* 141*  BUN 6 8  CREATININE 0.32* 0.53  CALCIUM 9.5 9.6   PT/INR  Recent Labs  03/12/13 1421 03/13/13 0352  LABPROT 16.4* 16.5*  INR 1.36 1.37   ABG No results found for this basename: PHART, PCO2, PO2, HCO3,  in the last 72 hours  Studies/Results: Ir Perc Biliary Drain-ext Only  03/13/2013   *RADIOLOGY REPORT*  IR PERCUTANEOUS BILIARY DRAIN EXTERNAL ONLY; IR ULTRASOUND GUIDANCE; IR PERCUTANEOUS TRANSHEPATIC CHOLANGIOGRAM  Date: 03/13/2013  Clinical History: 56-year female with metastatic colorectal cancer and malignant obstructed jaundice. She has a permanent no biliary stent in her right biliary tree.  However the left biliary tree remains completely obstructed by a central mass lesion. She requires percutaneous transhepatic cholangiogram and biliary drain placement for decompression.  Procedures Performed: 1. Percutaneous transhepatic cholangiogram 2.  Placement of an 8-French externalized biliary drain  Interventional Radiologist:  Sterling Big, MD  Sedation: Moderate (conscious) sedation was  used.  Three mg Versed, 100 mcg Fentanyl were administered intravenously.  The patient's vital signs were monitored continuously by radiology nursing throughout the procedure.  Sedation Time: 30 minutes  Fluoroscopy time: 8 minutes 24 seconds  Contrast volume: 25 ml Omnipaque-300 administered into the biliary tree  PROCEDURE/FINDINGS:   Informed consent was obtained from the patient following explanation of the procedure, risks, benefits and alternatives. The patient understands, agrees and consents for the procedure. All questions were addressed. A time out was performed.  Maximal barrier sterile technique utilized including caps, mask, sterile gowns, sterile gloves, large sterile drape, hand hygiene, and  betadine skin prep.  The left upper quadrant was interrogated with ultrasound.  Dilated biliary ducts were successfully identified.  Anesthesia was attained by infiltration of 1% lidocaine.  Under direct sonographic guidance, a 21 gauge Accustick needle was carefully advanced into the liver used to puncture a left biliary radicle.  A percutaneous transhepatic cholangiogram was then performed. There is severe intrahepatic biliary ductal dilatation.  The left main biliary ducts appears completely occluded.  There is no communication of the biliary tree with the previously placed metal stent.  The initial access was slightly central.  Therefore, using a combination of ultrasound and fluoroscopic guidance, a second percutaneous biliary access was obtained again using a 21-gauge micropuncture.  The tertiary biliary radicle in the inferior left hepatic lobe (segment three) was successfully punctured.  The Accustick sheath was advanced over a wire. Attempts were made to identify the main left hepatic duct using a 4-French angled  glide catheter and glide wire through the Accustick sheath.  This was ultimately unsuccessful. And 8-French Cook all-purpose drainage catheter was therefore advanced over a wire and positioned  within the central left biliary ducts.  The catheter was secured to the skin with O Prolene suture.  Clear minimally viscous bile was readily aspirated through the tube.  The tube was connected to gravity drainage.  There was no immediate complication, the patient tolerated the procedure well.  IMPRESSION:  1.  Percutaneous transhepatic cholangiogram demonstrates marked dilatation of the left biliary system with complete occlusion of the left main biliary duct.  The segment II, III, 4A and 4B ducts are all dilated.  2. Placement of a 8-French percutaneous biliary drain positioned within the central left hepatic ducts.  The drain was left external gravity drainage.  3.  Plan:  Recommend continued external drainage and decompression of the left biliary system.  The patient can return to interventional radiology in 1 - 2 weeks for another attempt at internalization of the percutaneous biliary drain.  Signed,  Sterling Big, MD Vascular & Interventional Radiologist [facility] Radiology   Original Report Authenticated By: Malachy Moan, M.D.   Ir Ptc  03/13/2013   *RADIOLOGY REPORT*  IR PERCUTANEOUS BILIARY DRAIN EXTERNAL ONLY; IR ULTRASOUND GUIDANCE; IR PERCUTANEOUS TRANSHEPATIC CHOLANGIOGRAM  Date: 03/13/2013  Clinical History: 56-year female with metastatic colorectal cancer and malignant obstructed jaundice. She has a permanent no biliary stent in her right biliary tree.  However the left biliary tree remains completely obstructed by a central mass lesion. She requires percutaneous transhepatic cholangiogram and biliary drain placement for decompression.  Procedures Performed: 1. Percutaneous transhepatic cholangiogram 2.  Placement of an 8-French externalized biliary drain  Interventional Radiologist:  Sterling Big, MD  Sedation: Moderate (conscious) sedation was used.  Three mg Versed, 100 mcg Fentanyl were administered intravenously.  The patient's vital signs were monitored continuously by  radiology nursing throughout the procedure.  Sedation Time: 30 minutes  Fluoroscopy time: 8 minutes 24 seconds  Contrast volume: 25 ml Omnipaque-300 administered into the biliary tree  PROCEDURE/FINDINGS:   Informed consent was obtained from the patient following explanation of the procedure, risks, benefits and alternatives. The patient understands, agrees and consents for the procedure. All questions were addressed. A time out was performed.  Maximal barrier sterile technique utilized including caps, mask, sterile gowns, sterile gloves, large sterile drape, hand hygiene, and  betadine skin prep.  The left upper quadrant was interrogated with ultrasound.  Dilated biliary ducts were successfully identified.  Anesthesia was attained by infiltration of 1% lidocaine.  Under direct sonographic guidance, a 21 gauge Accustick needle was carefully advanced into the liver used to puncture a left biliary radicle.  A percutaneous transhepatic cholangiogram was then performed. There is severe intrahepatic biliary ductal dilatation.  The left main biliary ducts appears completely occluded.  There is no communication of the biliary tree with the previously placed metal stent.  The initial access was slightly central.  Therefore, using a combination of ultrasound and fluoroscopic guidance, a second percutaneous biliary access was obtained again using a 21-gauge micropuncture.  The tertiary biliary radicle in the inferior left hepatic lobe (segment three) was successfully punctured.  The Accustick sheath was advanced over a wire. Attempts were made to identify the main left hepatic duct using a 4-French angled glide catheter and glide wire through the Accustick sheath.  This was ultimately unsuccessful. And 8-French Cook all-purpose drainage catheter was therefore advanced over a wire and positioned  within the central left biliary ducts.  The catheter was secured to the skin with O Prolene suture.  Clear minimally viscous bile was  readily aspirated through the tube.  The tube was connected to gravity drainage.  There was no immediate complication, the patient tolerated the procedure well.  IMPRESSION:  1.  Percutaneous transhepatic cholangiogram demonstrates marked dilatation of the left biliary system with complete occlusion of the left main biliary duct.  The segment II, III, 4A and 4B ducts are all dilated.  2. Placement of a 8-French percutaneous biliary drain positioned within the central left hepatic ducts.  The drain was left external gravity drainage.  3.  Plan:  Recommend continued external drainage and decompression of the left biliary system.  The patient can return to interventional radiology in 1 - 2 weeks for another attempt at internalization of the percutaneous biliary drain.  Signed,  Sterling Big, MD Vascular & Interventional Radiologist [facility] Radiology   Original Report Authenticated By: Malachy Moan, M.D.   Ir US Guide Bx Asp/drain  03/13/2013   *RADIOLOGY REPORT*  IR PERCUTANEOUS BILIARY DRAIN EXTERNAL ONLY; IR ULTRASOUND GUIDANCE; IR PERCUTANEOUS TRANSHEPATIC CHOLANGIOGRAM  Date: 03/13/2013  Clinical History: 56-year female with metastatic colorectal cancer and malignant obstructed jaundice. She has a permanent no biliary stent in her right biliary tree.  However the left biliary tree remains completely obstructed by a central mass lesion. She requires percutaneous transhepatic cholangiogram and biliary drain placement for decompression.  Procedures Performed: 1. Percutaneous transhepatic cholangiogram 2.  Placement of an 8-French externalized biliary drain  Interventional Radiologist:  Sterling Big, MD  Sedation: Moderate (conscious) sedation was used.  Three mg Versed, 100 mcg Fentanyl were administered intravenously.  The patient's vital signs were monitored continuously by radiology nursing throughout the procedure.  Sedation Time: 30 minutes  Fluoroscopy time: 8 minutes 24 seconds  Contrast  volume: 25 ml Omnipaque-300 administered into the biliary tree  PROCEDURE/FINDINGS:   Informed consent was obtained from the patient following explanation of the procedure, risks, benefits and alternatives. The patient understands, agrees and consents for the procedure. All questions were addressed. A time out was performed.  Maximal barrier sterile technique utilized including caps, mask, sterile gowns, sterile gloves, large sterile drape, hand hygiene, and  betadine skin prep.  The left upper quadrant was interrogated with ultrasound.  Dilated biliary ducts were successfully identified.  Anesthesia was attained by infiltration of 1% lidocaine.  Under direct sonographic guidance, a 21 gauge Accustick needle was carefully advanced into the liver used to puncture a left biliary radicle.  A percutaneous transhepatic cholangiogram was then performed. There is severe intrahepatic biliary ductal dilatation.  The left main biliary ducts appears completely occluded.  There is no communication of the biliary tree with the previously placed metal stent.  The initial access was slightly central.  Therefore, using a combination of ultrasound and fluoroscopic guidance, a second percutaneous biliary access was obtained again using a 21-gauge micropuncture.  The tertiary biliary radicle in the inferior left hepatic lobe (segment three) was successfully punctured.  The Accustick sheath was advanced over a wire. Attempts were made to identify the main left hepatic duct using a 4-French angled glide catheter and glide wire through the Accustick sheath.  This was ultimately unsuccessful. And 8-French Cook all-purpose drainage catheter was therefore advanced over a wire and positioned within the central left biliary ducts.  The catheter was secured to the skin with O Prolene suture.  Clear minimally viscous bile was readily  aspirated through the tube.  The tube was connected to gravity drainage.  There was no immediate complication,  the patient tolerated the procedure well.  IMPRESSION:  1.  Percutaneous transhepatic cholangiogram demonstrates marked dilatation of the left biliary system with complete occlusion of the left main biliary duct.  The segment II, III, 4A and 4B ducts are all dilated.  2. Placement of a 8-French percutaneous biliary drain positioned within the central left hepatic ducts.  The drain was left external gravity drainage.  3.  Plan:  Recommend continued external drainage and decompression of the left biliary system.  The patient can return to interventional radiology in 1 - 2 weeks for another attempt at internalization of the percutaneous biliary drain.  Signed,  Sterling Big, MD Vascular & Interventional Radiologist [facility] Radiology   Original Report Authenticated By: Malachy Moan, M.D.    Anti-infectives: Anti-infectives   Start     Dose/Rate Route Frequency Ordered Stop   03/13/13 1000  ciprofloxacin (CIPRO) IVPB 400 mg     400 mg 200 mL/hr over 60 Minutes Intravenous On call 03/12/13 1817 03/13/13 1212      Assessment/Plan: s/p * No surgery found *  L perc biliary drain placed 7/3 Output good Plan for dc today per pt Return to IR for poss internalization 3 weeks We will call pt with date and time   LOS: 2 days    Heyli Min A 03/14/2013

## 2013-03-14 NOTE — Progress Notes (Signed)
I agree with the above documentation, including the assessment and plan. Status post placement of external biliary drain which is draining well No downtrend and liver labs yet though it would be early to expect so Drain care Return to IR for attempted internalization in the next few weeks Office followup for labs next week and then Dr. Christella Hartigan thereafter

## 2013-03-15 ENCOUNTER — Other Ambulatory Visit (HOSPITAL_COMMUNITY): Payer: Self-pay | Admitting: Gastroenterology

## 2013-03-17 ENCOUNTER — Other Ambulatory Visit: Payer: Self-pay | Admitting: Oncology

## 2013-03-17 ENCOUNTER — Telehealth: Payer: Self-pay | Admitting: Gastroenterology

## 2013-03-17 ENCOUNTER — Telehealth: Payer: Self-pay

## 2013-03-17 ENCOUNTER — Encounter (HOSPITAL_COMMUNITY): Payer: Self-pay | Admitting: Pharmacy Technician

## 2013-03-17 DIAGNOSIS — C19 Malignant neoplasm of rectosigmoid junction: Secondary | ICD-10-CM

## 2013-03-17 NOTE — Telephone Encounter (Signed)
rov with me in 1-2 weeks, double book if needed, lfts the day prior. Also, please check on her IR appointment, that should be done around same time or sooner as my rov. Thanks

## 2013-03-17 NOTE — Telephone Encounter (Signed)
The patient has been notified of this information and all questions answered.

## 2013-03-17 NOTE — Telephone Encounter (Signed)
03/21/13 9 30  am arrival NPO midnight and will need driver. [facility]  03/25/13 930 am Dr Christella Hartigan f/u

## 2013-03-17 NOTE — Telephone Encounter (Signed)
Message copied by Donata Duff on Mon Mar 17, 2013  8:38 AM ------      Message from: Doug Sou D      Created: Fri Mar 14, 2013  9:01 AM      Regarding: Follow-up       Patient had left external percutaneous biliary drain placed by IR on 7/3.  Needs repeat labs Monday, 7/7 that need to be entered.  She also needs follow-up with IR in 1-4 weeks for attempt at internalization of the drain, according to their note, but no follow-up was made with them.  Can you please look into this as well.  And will likely need follow-up in office with Dr. Christella Hartigan as well.            Thank you,            Jess ------

## 2013-03-17 NOTE — Telephone Encounter (Signed)
Call placed to IR about follow up appt message left

## 2013-03-18 ENCOUNTER — Ambulatory Visit: Payer: Medicare HMO

## 2013-03-18 ENCOUNTER — Ambulatory Visit (HOSPITAL_BASED_OUTPATIENT_CLINIC_OR_DEPARTMENT_OTHER): Payer: Medicare HMO | Admitting: Nurse Practitioner

## 2013-03-18 ENCOUNTER — Other Ambulatory Visit: Payer: Self-pay | Admitting: Radiology

## 2013-03-18 ENCOUNTER — Telehealth: Payer: Self-pay | Admitting: *Deleted

## 2013-03-18 ENCOUNTER — Telehealth: Payer: Self-pay | Admitting: Oncology

## 2013-03-18 ENCOUNTER — Other Ambulatory Visit (HOSPITAL_BASED_OUTPATIENT_CLINIC_OR_DEPARTMENT_OTHER): Payer: Medicare HMO

## 2013-03-18 VITALS — BP 132/83 | HR 75 | Temp 98.6°F | Resp 18 | Ht 60.0 in | Wt 131.8 lb

## 2013-03-18 DIAGNOSIS — C787 Secondary malignant neoplasm of liver and intrahepatic bile duct: Secondary | ICD-10-CM

## 2013-03-18 DIAGNOSIS — C189 Malignant neoplasm of colon, unspecified: Secondary | ICD-10-CM

## 2013-03-18 DIAGNOSIS — C801 Malignant (primary) neoplasm, unspecified: Secondary | ICD-10-CM

## 2013-03-18 LAB — COMPREHENSIVE METABOLIC PANEL (CC13)
AST: 73 U/L — ABNORMAL HIGH (ref 5–34)
Albumin: 2.1 g/dL — ABNORMAL LOW (ref 3.5–5.0)
Alkaline Phosphatase: 172 U/L — ABNORMAL HIGH (ref 40–150)
BUN: 7.1 mg/dL (ref 7.0–26.0)
Calcium: 9.9 mg/dL (ref 8.4–10.4)
Creatinine: 0.6 mg/dL (ref 0.6–1.1)
Glucose: 135 mg/dl (ref 70–140)

## 2013-03-18 LAB — CBC WITH DIFFERENTIAL/PLATELET
BASO%: 0.7 % (ref 0.0–2.0)
HCT: 39.3 % (ref 34.8–46.6)
LYMPH%: 13.5 % — ABNORMAL LOW (ref 14.0–49.7)
MCH: 33.9 pg (ref 25.1–34.0)
MCHC: 34.4 g/dL (ref 31.5–36.0)
MCV: 98.4 fL (ref 79.5–101.0)
MONO%: 12.1 % (ref 0.0–14.0)
NEUT%: 71.8 % (ref 38.4–76.8)
Platelets: 233 10*3/uL (ref 145–400)
RBC: 3.99 10*6/uL (ref 3.70–5.45)

## 2013-03-18 LAB — UA PROTEIN, DIPSTICK - CHCC: Protein, ur: NEGATIVE mg/dL

## 2013-03-18 NOTE — Telephone Encounter (Signed)
Per staff message and POF I have scheduled appts.  JMW  

## 2013-03-18 NOTE — Telephone Encounter (Signed)
gv and printed appt sched and avs for pt....emailed MW to add tx...Marland Kitchenper pof cancel chemo

## 2013-03-18 NOTE — Progress Notes (Signed)
OFFICE PROGRESS NOTE  Interval history:  April Spence returns as scheduled. She underwent placement of a metallic biliary stent on 02/27/2013. There was no improvement in LFTs. She underwent placement of a percutaneous biliary drain by interventional radiology on 03/13/2013.  She continues to note jaundice. She denies pruritus. Appetite is poor. She has lost weight. She has discomfort at the biliary drain site. She denies fever. She has intermittent nausea. No vomiting. She has periodic loose stools.   Objective: Blood pressure 132/83, pulse 75, temperature 98.6 F (37 C), temperature source Oral, resp. rate 18, height 5' (1.524 m), weight 131 lb 12.8 oz (59.784 kg).  She is jaundiced. Lungs clear. Regular cardiac rhythm. Port-A-Cath site without erythema. Abdomen soft. Biliary drain left abdomen. Site is without erythema. Extremities without edema.  Lab Results: Lab Results  Component Value Date   WBC 6.5 03/18/2013   HGB 13.5 03/18/2013   HCT 39.3 03/18/2013   MCV 98.4 03/18/2013   PLT 233 03/18/2013    Chemistry:    Chemistry      Component Value Date/Time   NA 130* 03/14/2013 0417   NA 133* 02/25/2013 1124   NA 136 05/02/2011 0954   K 4.0 03/14/2013 0417   K 3.8 02/25/2013 1124   K 4.5 05/02/2011 0954   CL 95* 03/14/2013 0417   CL 99 02/25/2013 1124   CL 93* 05/02/2011 0954   CO2 28 03/14/2013 0417   CO2 24 02/25/2013 1124   CO2 29 05/02/2011 0954   BUN 8 03/14/2013 0417   BUN 8.5 02/25/2013 1124   BUN 12 05/02/2011 0954   CREATININE 0.53 03/14/2013 0417   CREATININE 0.6 02/25/2013 1124   CREATININE 0.5* 05/02/2011 0954      Component Value Date/Time   CALCIUM 9.6 03/14/2013 0417   CALCIUM 10.0 02/25/2013 1124   CALCIUM 9.3 05/02/2011 0954   ALKPHOS 259* 03/14/2013 0417   ALKPHOS 286* 02/25/2013 1124   ALKPHOS 78 05/02/2011 0954   AST 115* 03/14/2013 0417   AST 80* 02/25/2013 1124   AST 31 05/02/2011 0954   ALT 42* 03/14/2013 0417   ALT 38 02/25/2013 1124   BILITOT 10.1* 03/14/2013 0417   BILITOT 6.80*  02/25/2013 1124   BILITOT 0.40 05/02/2011 0954       Studies/Results: Ct Abdomen Pelvis W Contrast  03/11/2013   *RADIOLOGY REPORT*  Clinical Data: Increasing bilirubin.  Nausea.  History of colon cancer with metastases to liver.  Radiation therapy chemotherapy. Recent biliary stent placement.  CT ABDOMEN AND PELVIS WITH CONTRAST  Technique:  Multidetector CT imaging of the abdomen and pelvis was performed following the standard protocol during bolus administration of intravenous contrast.  Contrast: OMNIPAQUE IOHEXOL 300 MG/ML  SOLN  Comparison: ERCP of 02/27/2013.  MRI of 02/21/2013.  Most recent CT of 08/15/2012.  Findings: Lung bases:  Normal  Abdomen/pelvis:  The right liver lobe lesion described on 02/21/2013 is improved and partially obscured by the tip of the biliary stent.  Likely measures on the order of 2.2 cm on image 16/series 2.  2.6 cm of same level on the prior.  Mild cephalad right-sided intrahepatic biliary ductal dilatation has resolved.  The moderate left intrahepatic biliary ductal dilatation is similar to 02/21/2013.  For example, index intrahepatic duct measures 9 mm on image 22/series 2 versus 1.0 cm at same level on the prior exam. The common duct stent terminates proximally in the central right hepatic duct.  The obstruction involving the central left hepatic duct is  separate, and measures 1.5 x 1.7 cm on image 20/series 2. No new left-sided lesions are identified.  Small splenule.  Anatomic distortion secondary to extent of spinal curvature.  Mild pancreatic atrophy.  The biliary stent terminates at the level of the ampulla.  Portal veins patent.  Hepatic veins not well evaluated.  Cholecystectomy.  Normal adrenal glands.  Normal kidneys.  Retroaortic left renal vein.  No retroperitoneal adenopathy.  Large amount of colonic stool.  Surgical changes of right hemicolectomy.  Normal small bowel without abdominal ascites.    No evidence of omental or peritoneal disease.  No pelvic  adenopathy.  Normal urinary bladder.  Probable exophytic left sided dystrophic uterine fibroid.  This is unchanged. No adnexal mass or significant free fluid.  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  IMPRESSION:  1.  Since 02/21/2013, placement of a biliary stent.  This has its cephalad portion in the right hepatic ductal system, and does not traverse the previously described central left hepatic lobe stricture.  The stricture is similar in size and causes similar moderate left sided intrahepatic ductal dilatation. 2.  Decreased size of a right hepatic lobe metastasis, partially obscured by the tip of the biliary stent.  Ductal dilatation in this area has resolved. These results will be called to the ordering clinician or representative by the Radiologist Assistant, and communication documented in the PACS Dashboard. 3.  No evidence of extrahepatic metastatic disease. 4. Possible constipation. 5.  Distorted anatomy secondary to the extent of spinal curvature.   Original Report Authenticated By: Jeronimo Greaves, M.D.   Ir Perc Biliary Drain-ext Only  03/13/2013   *RADIOLOGY REPORT*  IR PERCUTANEOUS BILIARY DRAIN EXTERNAL ONLY; IR ULTRASOUND GUIDANCE; IR PERCUTANEOUS TRANSHEPATIC CHOLANGIOGRAM  Date: 03/13/2013  Clinical History: 56-year female with metastatic colorectal cancer and malignant obstructed jaundice. She has a permanent no biliary stent in her right biliary tree.  However the left biliary tree remains completely obstructed by a central mass lesion. She requires percutaneous transhepatic cholangiogram and biliary drain placement for decompression.  Procedures Performed: 1. Percutaneous transhepatic cholangiogram 2.  Placement of an 8-French externalized biliary drain  Interventional Radiologist:  Sterling Big, MD  Sedation: Moderate (conscious) sedation was used.  Three mg Versed, 100 mcg Fentanyl were administered intravenously.  The patient's  vital signs were monitored continuously by radiology nursing throughout the procedure.  Sedation Time: 30 minutes  Fluoroscopy time: 8 minutes 24 seconds  Contrast volume: 25 ml Omnipaque-300 administered into the biliary tree  PROCEDURE/FINDINGS:   Informed consent was obtained from the patient following explanation of the procedure, risks, benefits and alternatives. The patient understands, agrees and consents for the procedure. All questions were addressed. A time out was performed.  Maximal barrier sterile technique utilized including caps, mask, sterile gowns, sterile gloves, large sterile drape, hand hygiene, and  betadine skin prep.  The left upper quadrant was interrogated with ultrasound.  Dilated biliary ducts were successfully identified.  Anesthesia was attained by infiltration of 1% lidocaine.  Under direct sonographic guidance, a 21 gauge Accustick needle was carefully advanced into the liver used to puncture a left biliary radicle.  A percutaneous transhepatic cholangiogram was then performed. There is severe intrahepatic biliary ductal dilatation.  The left main biliary ducts appears completely occluded.  There is no communication of the biliary tree with the previously placed metal stent.  The initial access was slightly central.  Therefore, using a combination of ultrasound and fluoroscopic guidance, a second percutaneous  biliary access was obtained again using a 21-gauge micropuncture.  The tertiary biliary radicle in the inferior left hepatic lobe (segment three) was successfully punctured.  The Accustick sheath was advanced over a wire. Attempts were made to identify the main left hepatic duct using a 4-French angled glide catheter and glide wire through the Accustick sheath.  This was ultimately unsuccessful. And 8-French Cook all-purpose drainage catheter was therefore advanced over a wire and positioned within the central left biliary ducts.  The catheter was secured to the skin with O  Prolene suture.  Clear minimally viscous bile was readily aspirated through the tube.  The tube was connected to gravity drainage.  There was no immediate complication, the patient tolerated the procedure well.  IMPRESSION:  1.  Percutaneous transhepatic cholangiogram demonstrates marked dilatation of the left biliary system with complete occlusion of the left main biliary duct.  The segment II, III, 4A and 4B ducts are all dilated.  2. Placement of a 8-French percutaneous biliary drain positioned within the central left hepatic ducts.  The drain was left external gravity drainage.  3.  Plan:  Recommend continued external drainage and decompression of the left biliary system.  The patient can return to interventional radiology in 1 - 2 weeks for another attempt at internalization of the percutaneous biliary drain.  Signed,  Sterling Big, MD Vascular & Interventional Radiologist [facility]   Original Report Authenticated By: Malachy Moan, M.D.   Mr Liver W Wo Contrast  02/21/2013   *RADIOLOGY REPORT*  Clinical Data:  Metastatic colon cancer.  MRI ABDOMEN WITHOUT AND WITH CONTRAST  Technique:  Multiplanar multisequence MR imaging of the abdomen was performed both before and after the administration of intravenous contrast.  Contrast: 13mL MULTIHANCE GADOBENATE DIMEGLUMINE 529 MG/ML IV SOLN  Comparison:  10/10/2012  Findings:  Right hepatic lobe metastatic lesion measures 2.6 x 2.2 cm, image 36/12/30.  Previously this measured 4.0 x 3.1 cm. Lesion within the medial segment of left hepatic lobe is not significantly changed in size from previous exam measuring 1 cm.  However, however, there is now involvement of the central left hepatic lobe bile duct resulting in significant left hepatic lobe ductal dilatation.  Stable caliber of the common bile duct  Normal appearance of the pancreas.  The spleen appears normal.  The adrenal glands are both unremarkable.  Normal appearance of both kidneys.  No  significant upper abdominal adenopathy.  No free fluid identified.  IMPRESSION: 1.  The dominant lesion in the right hepatic lobe has decreased in size from previous exam. 2.  Medial segment of left hepatic lobe lesion is not significantly changed in size, but is now obstructing the left hepatic lobe bile ducts.   Original Report Authenticated By: Signa Kell, M.D.   Ir Ptc  03/13/2013   *RADIOLOGY REPORT*  IR PERCUTANEOUS BILIARY DRAIN EXTERNAL ONLY; IR ULTRASOUND GUIDANCE; IR PERCUTANEOUS TRANSHEPATIC CHOLANGIOGRAM  Date: 03/13/2013  Clinical History: 56-year female with metastatic colorectal cancer and malignant obstructed jaundice. She has a permanent no biliary stent in her right biliary tree.  However the left biliary tree remains completely obstructed by a central mass lesion. She requires percutaneous transhepatic cholangiogram and biliary drain placement for decompression.  Procedures Performed: 1. Percutaneous transhepatic cholangiogram 2.  Placement of an 8-French externalized biliary drain  Interventional Radiologist:  Sterling Big, MD  Sedation: Moderate (conscious) sedation was used.  Three mg Versed, 100 mcg Fentanyl were administered intravenously.  The patient's vital signs were monitored continuously  by radiology nursing throughout the procedure.  Sedation Time: 30 minutes  Fluoroscopy time: 8 minutes 24 seconds  Contrast volume: 25 ml Omnipaque-300 administered into the biliary tree  PROCEDURE/FINDINGS:   Informed consent was obtained from the patient following explanation of the procedure, risks, benefits and alternatives. The patient understands, agrees and consents for the procedure. All questions were addressed. A time out was performed.  Maximal barrier sterile technique utilized including caps, mask, sterile gowns, sterile gloves, large sterile drape, hand hygiene, and  betadine skin prep.  The left upper quadrant was interrogated with ultrasound.  Dilated biliary ducts were  successfully identified.  Anesthesia was attained by infiltration of 1% lidocaine.  Under direct sonographic guidance, a 21 gauge Accustick needle was carefully advanced into the liver used to puncture a left biliary radicle.  A percutaneous transhepatic cholangiogram was then performed. There is severe intrahepatic biliary ductal dilatation.  The left main biliary ducts appears completely occluded.  There is no communication of the biliary tree with the previously placed metal stent.  The initial access was slightly central.  Therefore, using a combination of ultrasound and fluoroscopic guidance, a second percutaneous biliary access was obtained again using a 21-gauge micropuncture.  The tertiary biliary radicle in the inferior left hepatic lobe (segment three) was successfully punctured.  The Accustick sheath was advanced over a wire. Attempts were made to identify the main left hepatic duct using a 4-French angled glide catheter and glide wire through the Accustick sheath.  This was ultimately unsuccessful. And 8-French Cook all-purpose drainage catheter was therefore advanced over a wire and positioned within the central left biliary ducts.  The catheter was secured to the skin with O Prolene suture.  Clear minimally viscous bile was readily aspirated through the tube.  The tube was connected to gravity drainage.  There was no immediate complication, the patient tolerated the procedure well.  IMPRESSION:  1.  Percutaneous transhepatic cholangiogram demonstrates marked dilatation of the left biliary system with complete occlusion of the left main biliary duct.  The segment II, III, 4A and 4B ducts are all dilated.  2. Placement of a 8-French percutaneous biliary drain positioned within the central left hepatic ducts.  The drain was left external gravity drainage.  3.  Plan:  Recommend continued external drainage and decompression of the left biliary system.  The patient can return to interventional radiology in 1  - 2 weeks for another attempt at internalization of the percutaneous biliary drain.  Signed,  Sterling Big, MD Vascular & Interventional Radiologist [facility]   Original Report Authenticated By: Malachy Moan, M.D.   Ir US Guide Bx Asp/drain  03/13/2013   *RADIOLOGY REPORT*  IR PERCUTANEOUS BILIARY DRAIN EXTERNAL ONLY; IR ULTRASOUND GUIDANCE; IR PERCUTANEOUS TRANSHEPATIC CHOLANGIOGRAM  Date: 03/13/2013  Clinical History: 56-year female with metastatic colorectal cancer and malignant obstructed jaundice. She has a permanent no biliary stent in her right biliary tree.  However the left biliary tree remains completely obstructed by a central mass lesion. She requires percutaneous transhepatic cholangiogram and biliary drain placement for decompression.  Procedures Performed: 1. Percutaneous transhepatic cholangiogram 2.  Placement of an 8-French externalized biliary drain  Interventional Radiologist:  Sterling Big, MD  Sedation: Moderate (conscious) sedation was used.  Three mg Versed, 100 mcg Fentanyl were administered intravenously.  The patient's vital signs were monitored continuously by radiology nursing throughout the procedure.  Sedation Time: 30 minutes  Fluoroscopy time: 8 minutes 24 seconds  Contrast volume: 25 ml Omnipaque-300 administered  into the biliary tree  PROCEDURE/FINDINGS:   Informed consent was obtained from the patient following explanation of the procedure, risks, benefits and alternatives. The patient understands, agrees and consents for the procedure. All questions were addressed. A time out was performed.  Maximal barrier sterile technique utilized including caps, mask, sterile gowns, sterile gloves, large sterile drape, hand hygiene, and  betadine skin prep.  The left upper quadrant was interrogated with ultrasound.  Dilated biliary ducts were successfully identified.  Anesthesia was attained by infiltration of 1% lidocaine.  Under direct sonographic guidance, a  21 gauge Accustick needle was carefully advanced into the liver used to puncture a left biliary radicle.  A percutaneous transhepatic cholangiogram was then performed. There is severe intrahepatic biliary ductal dilatation.  The left main biliary ducts appears completely occluded.  There is no communication of the biliary tree with the previously placed metal stent.  The initial access was slightly central.  Therefore, using a combination of ultrasound and fluoroscopic guidance, a second percutaneous biliary access was obtained again using a 21-gauge micropuncture.  The tertiary biliary radicle in the inferior left hepatic lobe (segment three) was successfully punctured.  The Accustick sheath was advanced over a wire. Attempts were made to identify the main left hepatic duct using a 4-French angled glide catheter and glide wire through the Accustick sheath.  This was ultimately unsuccessful. And 8-French Cook all-purpose drainage catheter was therefore advanced over a wire and positioned within the central left biliary ducts.  The catheter was secured to the skin with O Prolene suture.  Clear minimally viscous bile was readily aspirated through the tube.  The tube was connected to gravity drainage.  There was no immediate complication, the patient tolerated the procedure well.  IMPRESSION:  1.  Percutaneous transhepatic cholangiogram demonstrates marked dilatation of the left biliary system with complete occlusion of the left main biliary duct.  The segment II, III, 4A and 4B ducts are all dilated.  2. Placement of a 8-French percutaneous biliary drain positioned within the central left hepatic ducts.  The drain was left external gravity drainage.  3.  Plan:  Recommend continued external drainage and decompression of the left biliary system.  The patient can return to interventional radiology in 1 - 2 weeks for another attempt at internalization of the percutaneous biliary drain.  Signed,  Sterling Big, MD  Vascular & Interventional Radiologist [facility]   Original Report Authenticated By: Malachy Moan, M.D.   Dg Ercp  02/27/2013   *RADIOLOGY REPORT*  Clinical Data: History of metastatic colon carcinoma with liver metastases, biliary obstruction and jaundice.  ERCP  Comparison:  None.  Technique:  Multiple spot images obtained with the fluoroscopic device and submitted for interpretation post-procedure.  ERCP was performed by Dr. Christella Hartigan.  Findings: Imaging during the endoscopic procedure demonstrates cannulation of the common bile duct.  Injection shows evidence of irregular and dilated biliary ducts in the left lobe of the liver with a focal structure in the liver at the level of the central left duct.  Balloon dilatation was performed of the central segment of the left intrahepatic bile duct.  A self-expanding noncovered metallic stent was then placed extending to the left lobe of the liver and across the stricture to the level of the common bile duct.  The stent shows normal patency.  IMPRESSION: Imaging shows evidence of left intrahepatic biliary ductal obstruction due to a stricture of the central left intrahepatic bile duct.  This was treated with balloon dilatation  and placement of a self-expanding noncovered metal stent.  These images were submitted for radiologic interpretation only. Please see the procedural report for the amount of contrast and the fluoroscopy time utilized.   Original Report Authenticated By: Irish Lack, M.D.    Medications: I have reviewed the patient's current medications.  Assessment/Plan:  1. 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 She completed 12 cycles of FOLFOX chemotherapy. Oxaliplatin was held with cycles number 5, 8, 11, and 12. 2. Metastatic colon cancer confirmed on a restaging CT evaluation 05/02/2011 with liver metastases and  periportal lymphadenopathy. She began treatment with FOLFIRI on 06/01/2011. Avastin was added beginning with cycle number 2. She completed cycle #6 08/08/2011. Restaging CT evaluation 08/15/2011 showed improvement in the liver metastases and porta hepatis lymphadenopathy. She completed cycle #12 FOLFIRI/Avastin 11/07/2011. Restaging CT evaluation 11/24/2011 showed decrease in size of hepatic metastases and no evidence of disease progression or new metastasis. She completed cycle 18 on 03/11/2012. A restaging CT 03/29/2012 revealed stable disease. Irinotecan was discontinued and treatment continued with 5-FU/leucovorin and Avastin on a 3 week schedule beginning 04/02/2012. -restaging CT 08/15/2012 with slight enlargement of the central liver lesion and progression of intrahepatic biliary dilatation, no other liver lesions and no other evidence of progressive metastatic disease. CEA stable on 08/20/2012. Treatment continued with 5-FU and Avastin on a 3 week schedule.restaging CT 09/30/2012 found the dominant central right liver metastasis to be slightly larger with progressive intrahepatic biliary dilatation, a previously described left hepatic lobe lesion appeared smaller measuring 10 mm and appeared separate from the liver and is felt to most likely represent a porta hepatis lymph node.A. Suspected 9 mm metastasis was noted in the medial left hepatic lobe on an MRI 10/10/2012 -status post stereotactic radiosurgeryto the dominant liver metastasis in 3 fractions February 10 through 10/28/2012. 5-FU/Avastin was resumed on a 3 week schedule 12/03/2012. CEA in normal range on 12/03/2012 and 01/14/2013. Restaging MRI of the liver 02/21/2013 with a decrease in the dominant right hepatic lesion, stable medial left hepatic lobe lesion-but the left lesion is now causing obstruction of the left hepatic bile ducts. 3. Status post Port-A-Cath placement and Port-A-Cath removal by Dr. Michaell Cowing. A new Port-A-Cath was placed on  05/16/2011. The Port-A-Cath was removed due to malposition. A new Port-A-Cath was placed on 05/30/2011. 4. Spina bifida with severe scoliosis.  5. Chronic bilateral foot drop/lower leg and foot numbness. 6. Hypothyroidism. 7. History of chemotherapy-induced diarrhea. The bolus and infusional 5-FU were reduced by 25% beginning with cycle number 2 of FOLFOX. The 5-FU dose was decreased by an additional 10% beginning with cycle number 8. 8. History of delayed nausea following chemotherapy. 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 The oxaliplatin was dose reduced by 20% beginning with cycle number 6. The neuropathy symptoms resolved. 10. History of neutropenia secondary to chemotherapy. 11. Status post a colonoscopy 05/05/2010. There was a previous right colectomy anastomosis and slight nodularity. Biopsies were taken which showed benign colonic mucosa with hyperplastic epithelial changes. No significant inflammation, granuloma, or adenomatous epithelium was identified. 12. Diarrhea following FOLFIRI chemotherapy. Overall the diarrhea is controlled with Lomotil.  13. History of a fungal rash in the groin, improved with Diflucan and nystatin powder. 14. Mouth ulcers, oral candidiasis following cycle #6 of FOLFIRI-the ulcers resolved following treatment with Diflucan. She continues to note mild mouth ulcers following each treatment.  15. Nausea during chemotherapy-Zofran was added to the chemotherapy regimen beginning on 07/09/2012. 16. Oozing at the gumline secondary to periodontal disease and Avastin 17. Jaundice secondary to biliary obstruction status post placement of a metallic biliary stent on 02/27/2013 without improvement in LFTs. Status post placement of a percutaneous biliary drain by interventional radiology on 03/13/2013.  Disposition-the bilirubin is better but remains elevated. Dr. Truett Perna recommends continuing to hold chemotherapy pending further improvement in the  bilirubin. She will return for labs and a followup visit in 2 weeks. She will contact the office in the interim with any problems. We specifically discussed fever.  Plan reviewed with Dr. Truett Perna.  Lonna Cobb ANP/GNP-BC

## 2013-03-19 ENCOUNTER — Telehealth: Payer: Self-pay | Admitting: Gastroenterology

## 2013-03-19 NOTE — Telephone Encounter (Signed)
Pt was given radiology phone number to call and inquire about specific instructions, she thanked me for my help

## 2013-03-21 ENCOUNTER — Ambulatory Visit (HOSPITAL_COMMUNITY)
Admission: RE | Admit: 2013-03-21 | Discharge: 2013-03-21 | Disposition: A | Discharge status: Home / Self Care | Payer: Medicare HMO | Source: Ambulatory Visit | Attending: Gastroenterology | Admitting: Gastroenterology

## 2013-03-21 ENCOUNTER — Other Ambulatory Visit: Payer: Self-pay | Admitting: Gastroenterology

## 2013-03-21 ENCOUNTER — Encounter (HOSPITAL_COMMUNITY): Payer: Self-pay

## 2013-03-21 VITALS — BP 140/81 | HR 82 | Temp 97.6°F | Resp 17 | Ht 60.0 in | Wt 131.0 lb

## 2013-03-21 DIAGNOSIS — E785 Hyperlipidemia, unspecified: Secondary | ICD-10-CM | POA: Insufficient documentation

## 2013-03-21 DIAGNOSIS — Z9221 Personal history of antineoplastic chemotherapy: Secondary | ICD-10-CM | POA: Insufficient documentation

## 2013-03-21 DIAGNOSIS — C19 Malignant neoplasm of rectosigmoid junction: Secondary | ICD-10-CM

## 2013-03-21 DIAGNOSIS — E039 Hypothyroidism, unspecified: Secondary | ICD-10-CM | POA: Insufficient documentation

## 2013-03-21 DIAGNOSIS — C787 Secondary malignant neoplasm of liver and intrahepatic bile duct: Secondary | ICD-10-CM | POA: Insufficient documentation

## 2013-03-21 DIAGNOSIS — C189 Malignant neoplasm of colon, unspecified: Secondary | ICD-10-CM | POA: Insufficient documentation

## 2013-03-21 HISTORY — PX: PERCUTANEOUS TRANSHEPATIC CHOLANGIOGRAHPY AND BILLIARY DRAINAGE: SHX2212

## 2013-03-21 LAB — CBC WITH DIFFERENTIAL/PLATELET
Eosinophils Absolute: 0.2 10*3/uL (ref 0.0–0.7)
Hemoglobin: 14.2 g/dL (ref 12.0–15.0)
Lymphocytes Relative: 12 % (ref 12–46)
Lymphs Abs: 0.9 10*3/uL (ref 0.7–4.0)
MCH: 32.5 pg (ref 26.0–34.0)
MCV: 99.8 fL (ref 78.0–100.0)
Monocytes Relative: 12 % (ref 3–12)
Neutrophils Relative %: 73 % (ref 43–77)
Platelets: 233 10*3/uL (ref 150–400)
RBC: 4.37 MIL/uL (ref 3.87–5.11)
WBC: 7.2 10*3/uL (ref 4.0–10.5)

## 2013-03-21 LAB — COMPREHENSIVE METABOLIC PANEL
ALT: 31 U/L (ref 0–35)
Alkaline Phosphatase: 184 U/L — ABNORMAL HIGH (ref 39–117)
BUN: 7 mg/dL (ref 6–23)
CO2: 26 mEq/L (ref 19–32)
GFR calc Af Amer: >90 mL/min (ref >90)
GFR calc non Af Amer: >90 mL/min (ref >90)
Glucose, Bld: 130 mg/dL — ABNORMAL HIGH (ref 70–99)
Potassium: 4 mEq/L (ref 3.5–5.1)
Sodium: 132 mEq/L — ABNORMAL LOW (ref 135–145)
Total Bilirubin: 6.8 mg/dL — ABNORMAL HIGH (ref 0.3–1.2)

## 2013-03-21 LAB — PROTIME-INR: Prothrombin Time: 16.4 seconds — ABNORMAL HIGH (ref 11.6–15.2)

## 2013-03-21 MED ORDER — SODIUM CHLORIDE 0.9 % IV SOLN
INTRAVENOUS | Status: DC
  Administered 2013-03-21: 10:00:00 via INTRAVENOUS

## 2013-03-21 MED ORDER — FENTANYL CITRATE 0.05 MG/ML IJ SOLN
INTRAMUSCULAR | Status: AC
  Filled 2013-03-21: qty 6

## 2013-03-21 MED ORDER — LIDOCAINE HCL 1 % IJ SOLN
INTRAMUSCULAR | Status: AC
  Filled 2013-03-21: qty 20

## 2013-03-21 MED ORDER — OXYCODONE-ACETAMINOPHEN 5-325 MG PO TABS
1.0000 | ORAL_TABLET | Freq: Once | ORAL | Status: AC
  Administered 2013-03-21: 1 via ORAL
  Filled 2013-03-21: qty 1

## 2013-03-21 MED ORDER — FENTANYL CITRATE 0.05 MG/ML IJ SOLN
INTRAMUSCULAR | Status: AC | PRN
  Administered 2013-03-21 (×2): 50 ug via INTRAVENOUS
  Administered 2013-03-21: 100 ug via INTRAVENOUS

## 2013-03-21 MED ORDER — IOHEXOL 300 MG/ML  SOLN: 10.0000 mL | Freq: Once | INTRAMUSCULAR | Status: DC | PRN

## 2013-03-21 MED ORDER — MIDAZOLAM HCL 2 MG/2ML IJ SOLN
INTRAMUSCULAR | Status: AC
  Filled 2013-03-21: qty 6

## 2013-03-21 MED ORDER — MIDAZOLAM HCL 2 MG/2ML IJ SOLN
INTRAMUSCULAR | Status: AC | PRN
  Administered 2013-03-21 (×5): 0.5 mg via INTRAVENOUS
  Administered 2013-03-21: 1 mg via INTRAVENOUS

## 2013-03-21 MED ORDER — OXYCODONE-ACETAMINOPHEN 5-325 MG PO TABS: 1.0000 | ORAL_TABLET | Freq: Once | ORAL | Status: DC

## 2013-03-21 MED ORDER — CIPROFLOXACIN IN D5W 400 MG/200ML IV SOLN
400.0000 mg | Freq: Once | INTRAVENOUS | Status: AC
  Administered 2013-03-21: 400 mg via INTRAVENOUS
  Filled 2013-03-21: qty 200

## 2013-03-21 NOTE — H&P (Signed)
Agree.  Will check anatomy with cholangiogram through biliary catheter first.

## 2013-03-21 NOTE — Procedures (Signed)
Procedure:  Cholangiogram through preexisting biliary drain and biliary drain exchange Findings:  Cholangiogram shows interval decompression of left lobe ducts.  High grade obstruction at confluence of right lobe and left lobe ducts could not be crossed with multiple guide wires.  External biliary drain upsized to 10 Fr and advanced further towards confluence of central ducts.  Will leave to gravity bag drainage.  Doubt that this obstruction will be able to be crossed until the patient can receive more chemotherapy to try to shrink tumor.  Will d/w Drs. Christella Hartigan and [place].

## 2013-03-21 NOTE — H&P (Signed)
Chief Complaint: "I'm here with my biliary drain" Referring Physician:Jacobs HPI: April Spence is an 57 y.o. 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 Pt is s/p placement of a metallic biliary stent to right hepatic lobe 02/27/2013 with persistent and rising total bilirubin. She then had a left external biliary stent placed which has decompressed her some. She is now scheduled today for attempt at internalization. She feels better, her drain output has been a few hundred mLs per day. PMHx and meds reviewed  Past Medical History:  Past Medical History  Diagnosis Date  . colon ca dx'd 04/2009    chemo comp 11/2009.. colon and liver  . Spina bifida   . Scoliosis   . Hyperlipidemia   . Foot drop     bilateral,wears braces  . Mitral valve prolapse   . Hypothyroidism   . Depression   . History of chemotherapy     5-FU and Avastin 12 cycles FOLFOX  . Colon cancer 05/06/2010    metastatic  . Hepatic metastases 08/15/12    per ct abdomen  . Radiation 10/21/12-10/28/12    Palliative liver mets 54 gray in 3 fx's  . Arrhythmia 02-26-13    "rapid heart rate occ."    Past Surgical History:  Past Surgical History  Procedure Laterality Date  . Cholecystectomy    . Back surgery  1980 and 2010  . Knee arthroscopy  2005    left  . Tubal ligation  1973  . Colonoscopy  05/05/2010  . Colon surgery      2010  . Portacath placement  05/30/2011    tip at cavoatrialjjunction by xray  . Ercp N/A 02/27/2013    Procedure: ENDOSCOPIC RETROGRADE CHOLANGIOPANCREATOGRAPHY (ERCP);  Surgeon: Rachael Fee, MD;  Location: [facility];  Service: Endoscopy;  Laterality: N/A;  . Biliary stent placement N/A 02/27/2013    Procedure: BILIARY STENT PLACEMENT;  Surgeon: Rachael Fee, MD;  Location: [facility];  Service: Endoscopy;  Laterality: N/A;    Family History:  Family History  Problem Relation Age of Onset  . Cancer Sister     breast  . Cancer Maternal  Grandmother     breast  . Cancer Other 87    breast ca    Social History:  reports that she quit smoking about 6 years ago. She has never used smokeless tobacco. She reports that she does not drink alcohol or use illicit drugs.  Allergies:  Allergies  Allergen Reactions  . Adhesive (Tape)     Pt is fine with paper tape    Medications:    Medication List    ASK your doctor about these medications       amitriptyline 25 MG tablet  Commonly known as:  ELAVIL  Take 25 mg by mouth at bedtime.     chlorhexidine 0.12 % solution  Commonly known as:  PERIDEX  Use as directed 15 mLs in the mouth or throat 2 (two) times daily.     diphenoxylate-atropine 2.5-0.025 MG per tablet  Commonly known as:  LOMOTIL  Take 1 tablet by mouth 4 (four) times daily as needed for diarrhea or loose stools.     fenofibrate micronized 134 MG capsule  Commonly known as:  LOFIBRA  Take 134 mg by mouth every evening.     FLUoxetine 20 MG capsule  Commonly known as:  PROZAC  Take 20 mg by mouth every morning.     levothyroxine 150 MCG tablet  Commonly known as:  SYNTHROID, LEVOTHROID  Take 150 mcg by mouth daily before breakfast.     lidocaine-prilocaine cream  Commonly known as:  EMLA  Apply topically as needed. APPLY TO PORTACATH SITE 1-2 HOURS PRIOR TO USE     LORazepam 0.5 MG tablet  Commonly known as:  ATIVAN  Take 1 tablet (0.5 mg total) by mouth every 12 (twelve) hours as needed for anxiety.     magic mouthwash Soln  Take 10 mLs by mouth 4 (four) times daily as needed. Swish and spit     nystatin 100000 UNIT/GM Powd  Apply 1 g topically daily as needed (for rash). In summer on irritated areas     ondansetron 8 MG tablet  Commonly known as:  ZOFRAN  Take 1 tablet (8 mg total) by mouth every 12 (twelve) hours as needed for nausea.     oxyCODONE-acetaminophen 5-325 MG per tablet  Commonly known as:  PERCOCET/ROXICET  Take 1 tablet by mouth every 4 (four) hours as needed for pain.      PRESCRIPTION MEDICATION  Pt gets chemotherapy at [facility]. Last treatment was on 02-11-13. Pt missed last treatment due to endo procedure. Followed by Dr Truett Perna     prochlorperazine 10 MG tablet  Commonly known as:  COMPAZINE  Take 10 mg by mouth every 6 (six) hours as needed (nausea).     propranolol 80 MG tablet  Commonly known as:  INDERAL  Take 80 mg by mouth every morning.     traMADol-acetaminophen 37.5-325 MG per tablet  Commonly known as:  ULTRACET  Take 1 tablet by mouth every 6 (six) hours as needed for pain.     Vitamin D-3 5000 UNITS Tabs  Take 5,000 mg by mouth 2 (two) times daily.        Please HPI for pertinent positives, otherwise complete 10 system ROS negative.  Physical Exam: BP 135/90  Pulse 76  Temp(Src) 97.6 F (36.4 C) (Oral)  Resp 20  Ht 5' (1.524 m)  Wt 131 lb (59.421 kg)  BMI 25.58 kg/m2  SpO2 98% Body mass index is 25.58 kg/(m^2).   General Appearance:  Alert, cooperative, no distress, appears stated age  Head:  Normocephalic, without obvious abnormality, atraumatic  ENT: Unremarkable  Neck: Supple, symmetrical, trachea midline  Lungs:   Clear to auscultation bilaterally, no w/r/r, respirations unlabored without use of accessory muscles.  Chest Wall:  No tenderness or deformity  Heart:  Regular rate and rhythm, S1, S2 normal, no murmur, rub or gallop.  Abdomen:   Soft, non-tender, non distended. (L)upper abd drain intact, NT, golden bilious output  Neurologic: Normal affect, no gross deficits.   Results for orders placed during the hospital encounter of 03/21/13 (from the past 48 hour(s))  APTT     Status: Abnormal   Collection Time    03/21/13  9:40 AM      Result Value Range   aPTT 39 (*) 24 - 37 seconds   Comment:            IF BASELINE aPTT IS ELEVATED,     SUGGEST PATIENT RISK ASSESSMENT     BE USED TO DETERMINE APPROPRIATE     ANTICOAGULANT THERAPY.  CBC WITH DIFFERENTIAL     Status: Abnormal   Collection Time    03/21/13   9:40 AM      Result Value Range   WBC 7.2  4.0 - 10.5 K/uL   RBC 4.37  3.87 - 5.11 MIL/uL  Hemoglobin 14.2  12.0 - 15.0 g/dL   HCT 95.6  21.3 - 08.6 %   MCV 99.8  78.0 - 100.0 fL   MCH 32.5  26.0 - 34.0 pg   MCHC 32.6  30.0 - 36.0 g/dL   RDW 57.8 (*) 46.9 - 62.9 %   Platelets 233  150 - 400 K/uL   Neutrophils Relative % 73  43 - 77 %   Neutro Abs 5.3  1.7 - 7.7 K/uL   Lymphocytes Relative 12  12 - 46 %   Lymphs Abs 0.9  0.7 - 4.0 K/uL   Monocytes Relative 12  3 - 12 %   Monocytes Absolute 0.9  0.1 - 1.0 K/uL   Eosinophils Relative 3  0 - 5 %   Eosinophils Absolute 0.2  0.0 - 0.7 K/uL   Basophils Relative 0  0 - 1 %   Basophils Absolute 0.0  0.0 - 0.1 K/uL  COMPREHENSIVE METABOLIC PANEL     Status: Abnormal   Collection Time    03/21/13  9:40 AM      Result Value Range   Sodium 132 (*) 135 - 145 mEq/L   Potassium 4.0  3.5 - 5.1 mEq/L   Chloride 97  96 - 112 mEq/L   CO2 26  19 - 32 mEq/L   Glucose, Bld 130 (*) 70 - 99 mg/dL   BUN 7  6 - 23 mg/dL   Creatinine, Ser 5.28 (*) 0.50 - 1.10 mg/dL   Calcium 41.3  8.4 - 24.4 mg/dL   Total Protein 8.0  6.0 - 8.3 g/dL   Albumin 2.4 (*) 3.5 - 5.2 g/dL   AST 71 (*) 0 - 37 U/L   ALT 31  0 - 35 U/L   Alkaline Phosphatase 184 (*) 39 - 117 U/L   Total Bilirubin 6.8 (*) 0.3 - 1.2 mg/dL   GFR calc non Af Amer >90  >90 mL/min   GFR calc Af Amer >90  >90 mL/min   Comment:            The eGFR has been calculated     using the CKD EPI equation.     This calculation has not been     validated in all clinical     situations.     eGFR's persistently     <90 mL/min signify     possible Chronic Kidney Disease.  PROTIME-INR     Status: Abnormal   Collection Time    03/21/13  9:40 AM      Result Value Range   Prothrombin Time 16.4 (*) 11.6 - 15.2 seconds   INR 1.36  0.00 - 1.49   No results found.  Assessment/Plan Obstructive jaundice from colon cancer For attempt at internalization of left biliary drain with cholangiogram Explained  procedure, risks, possibility that we may not be able to successfully cross obstruction. She understands she may have to keep the external drain and even if an internal component is placed, she will likely have an external component remaining as a 'safety valve' Labs reviewed, Bili has trended down since her hospitalization. Consent signed in chart  Brayton El PA-C 03/21/2013, 11:32 AM

## 2013-03-24 ENCOUNTER — Telehealth: Payer: Self-pay | Admitting: Gastroenterology

## 2013-03-24 NOTE — Telephone Encounter (Signed)
Pt was advised to call her PCP but she can certainly talk with Dr Christella Hartigan about that at her office visit tomorrow, pt agreed

## 2013-03-25 ENCOUNTER — Other Ambulatory Visit: Payer: Self-pay

## 2013-03-25 ENCOUNTER — Encounter: Payer: Self-pay | Admitting: Gastroenterology

## 2013-03-25 ENCOUNTER — Ambulatory Visit (INDEPENDENT_AMBULATORY_CARE_PROVIDER_SITE_OTHER): Payer: Medicare HMO | Admitting: Gastroenterology

## 2013-03-25 VITALS — BP 114/76 | HR 72 | Ht 60.0 in | Wt 131.0 lb

## 2013-03-25 DIAGNOSIS — R933 Abnormal findings on diagnostic imaging of other parts of digestive tract: Secondary | ICD-10-CM

## 2013-03-25 MED ORDER — OXYCODONE-ACETAMINOPHEN 5-325 MG PO TABS: 1.0000 | ORAL_TABLET | ORAL | Status: DC | PRN

## 2013-03-25 NOTE — Patient Instructions (Addendum)
You will have labs checked next Tuesday at [facility] Refill prescription for percocet was written. We will be discussing your case tomorrow at GI multidisciplinary group meeting.  A goal will be to shrink left liver tumor enough to allow internalization of your biliary drain.                                               We are excited to introduce MyChart, a new best-in-class service that provides you online access to important information in your electronic medical record. We want to make it easier for you to view your health information - all in one secure location - when and where you need it. We expect MyChart will enhance the quality of care and service we provide.  When you register for MyChart, you can:    View your test results.    Request appointments and receive appointment reminders via email.    Request medication renewals.    View your medical history, allergies, medications and immunizations.    Communicate with your physician's office through a password-protected site.    Conveniently print information such as your medication lists.  To find out if MyChart is right for you, please talk to a member of our clinical staff today. We will gladly answer your questions about this free health and wellness tool.  If you are age 59 or older and want a member of your family to have access to your record, you must provide written consent by completing a proxy form available at our office. Please speak to our clinical staff about guidelines regarding accounts for patients younger than age 63.  As you activate your MyChart account and need any technical assistance, please call the MyChart technical support line at [phone]) or email your question to [email or web address]. If you email your question(s), please include your name, a return phone number and the best time to reach you.  If you have non-urgent health-related questions, you can send a  message to our office through MyChart at [email or web address]. If you have a medical emergency, call 911.  Thank you for using MyChart as your new health and wellness resource!   MyChart licensed from [employer],  [id number]. Patents Pending.

## 2013-03-25 NOTE — Progress Notes (Signed)
Review of pertinent gastrointestinal problems: 1. metastatic colon cancer, masses in liver; 02/2013 MRI shows dilated left intrahepatic ducts with obstructing mass in liver, new jaundice.  ERCP 02/2013 Christella Hartigan, placed 6cm long uncovered sems into what appeared to be obstructed left bile ducts (IR came into endo suite during exam to help with her confusing anatomy) however this proved to be into right duct system (also had obstrucing mass).  Eventual IR percutaneous drain placed and then this was upsized 03/2013.  HPI: This is a  very pleasant 57 year old woman whom I last saw the time of the ERCP 2-3 weeks ago. Since then she's had percutaneous biliary drain placed and then upsized.    Past Medical History  Diagnosis Date  . colon ca dx'd 04/2009    chemo comp 11/2009.. colon and liver  . Spina bifida   . Scoliosis   . Hyperlipidemia   . Foot drop     bilateral,wears braces  . Mitral valve prolapse   . Hypothyroidism   . Depression   . History of chemotherapy     5-FU and Avastin 12 cycles FOLFOX  . Colon cancer 05/06/2010    metastatic  . Hepatic metastases 08/15/12    per ct abdomen  . Radiation 10/21/12-10/28/12    Palliative liver mets 54 gray in 3 fx's  . Arrhythmia 02-26-13    "rapid heart rate occ."    Past Surgical History  Procedure Laterality Date  . Cholecystectomy    . Back surgery  1980 and 2010  . Knee arthroscopy  2005    left  . Tubal ligation  1973  . Colonoscopy  05/05/2010  . Colon surgery      2010  . Portacath placement  05/30/2011    tip at cavoatrialjjunction by xray  . Ercp N/A 02/27/2013    Procedure: ENDOSCOPIC RETROGRADE CHOLANGIOPANCREATOGRAPHY (ERCP);  Surgeon: Rachael Fee, MD;  Location: [facility];  Service: Endoscopy;  Laterality: N/A;  . Biliary stent placement N/A 02/27/2013    Procedure: BILIARY STENT PLACEMENT;  Surgeon: Rachael Fee, MD;  Location: [facility];  Service: Endoscopy;  Laterality: N/A;    Current Outpatient Prescriptions   Medication Sig Dispense Refill  . Alum & Mag Hydroxide-Simeth (MAGIC MOUTHWASH) SOLN Take 10 mLs by mouth 4 (four) times daily as needed. Swish and spit  240 mL  2  . amitriptyline (ELAVIL) 25 MG tablet Take 25 mg by mouth at bedtime.       . chlorhexidine (PERIDEX) 0.12 % solution Use as directed 15 mLs in the mouth or throat 2 (two) times daily.       . Cholecalciferol (VITAMIN D-3) 5000 UNITS TABS Take 5,000 mg by mouth 2 (two) times daily.      . diphenoxylate-atropine (LOMOTIL) 2.5-0.025 MG per tablet Take 1 tablet by mouth 4 (four) times daily as needed for diarrhea or loose stools.      . fenofibrate micronized (LOFIBRA) 134 MG capsule Take 134 mg by mouth every evening.       Marland Kitchen FLUoxetine (PROZAC) 20 MG capsule Take 20 mg by mouth every morning.       Marland Kitchen levothyroxine (SYNTHROID, LEVOTHROID) 150 MCG tablet Take 150 mcg by mouth daily before breakfast.      . lidocaine-prilocaine (EMLA) cream Apply topically as needed. APPLY TO PORTACATH SITE 1-2 HOURS PRIOR TO USE  30 g  5  . LORazepam (ATIVAN) 0.5 MG tablet Take 1 tablet (0.5 mg total) by mouth every 12 (twelve) hours as  needed for anxiety.  30 tablet  2  . nystatin (NYSTOP) 100000 UNIT/GM POWD Apply 1 g topically daily as needed (for rash). In summer on irritated areas      . ondansetron (ZOFRAN) 8 MG tablet Take 1 tablet (8 mg total) by mouth every 12 (twelve) hours as needed for nausea.  20 tablet  2  . oxyCODONE-acetaminophen (PERCOCET/ROXICET) 5-325 MG per tablet Take 1 tablet by mouth every 4 (four) hours as needed for pain.      Marland Kitchen PRESCRIPTION MEDICATION Pt gets chemotherapy at [facility]. Last treatment was on 02-11-13. Pt missed last treatment due to endo procedure. Followed by Dr Truett Perna      . prochlorperazine (COMPAZINE) 10 MG tablet Take 10 mg by mouth every 6 (six) hours as needed (nausea).       . propranolol (INDERAL) 80 MG tablet Take 80 mg by mouth every morning.       . traMADol-acetaminophen (ULTRACET) 37.5-325 MG per tablet  Take 1 tablet by mouth every 6 (six) hours as needed for pain.        No current facility-administered medications for this visit.   Facility-Administered Medications Ordered in Other Visits  Medication Dose Route Frequency Provider Last Rate Last Dose  . fluorouracil (ADRUCIL) 2,600 mg in sodium chloride 0.9 % 150 mL chemo infusion  1,475 mg/m2 (Treatment Plan Actual) Intravenous 1 day or 1 dose Ladene Artist, MD   2,600 mg at 11/12/12 1603  . heparin lock flush 100 unit/mL  500 Units Intracatheter Once Ladene Artist, MD      . sodium chloride 0.9 % injection 10 mL  10 mL Intracatheter PRN Ladene Artist, MD        Allergies as of 03/25/2013 - Review Complete 03/25/2013  Allergen Reaction Noted  . Adhesive (tape)  05/22/2011    Family History  Problem Relation Age of Onset  . Cancer Sister     breast  . Cancer Maternal Grandmother     breast  . Cancer Other 49    breast ca    History   Social History  . Marital Status: Married    Spouse Name: N/A    Number of Children: 0  . Years of Education: N/A   Occupational History  . Not on file.   Social History Main Topics  . Smoking status: Former Smoker    Quit date: 06/27/2006  . Smokeless tobacco: Never Used  . Alcohol Use: No  . Drug Use: No  . Sexually Active: Not Currently   Other Topics Concern  . Not on file   Social History Narrative  . No narrative on file      Physical Exam: BP 114/76  Pulse 72  Ht 5' (1.524 m)  Wt 131 lb (59.421 kg)  BMI 25.58 kg/m2 Constitutional: generally well-appearing Psychiatric: alert and oriented x3 Abdomen: soft, nontender, nondistended, no obvious ascites, no peritoneal signs, normal bowel sounds     Assessment and plan: 57 y.o. female with metastatic colon cancer, obstructing liver lesions  Difficult situation. We will be discussing her case at tomorrow's multidisciplinary GI conference. I gave her a refill of her Percocets. We will add complete metabolic  profile to labs that are due to be drawn early next week at the cancer Center. I would expect her bilirubin to normalize within the next 2-3 weeks. Obviously a long-term goal will be to internalize her biliary drain.

## 2013-03-26 ENCOUNTER — Telehealth: Payer: Self-pay | Admitting: *Deleted

## 2013-03-26 NOTE — Telephone Encounter (Signed)
Referral sent to Dr. Mitzi Hansen per request of Dr. Truett Perna.

## 2013-03-27 ENCOUNTER — Encounter: Payer: Self-pay | Admitting: Radiation Oncology

## 2013-03-27 NOTE — Progress Notes (Signed)
Per fax from [facility], patient has met her deductible and her out of pocket max. [employer] pays at 80% for [facility] specialist visits.

## 2013-03-28 ENCOUNTER — Ambulatory Visit
Admission: RE | Admit: 2013-03-28 | Discharge: 2013-03-28 | Disposition: A | Discharge status: Home / Self Care | Payer: Medicare HMO | Source: Ambulatory Visit | Attending: Radiation Oncology | Admitting: Radiation Oncology

## 2013-03-28 ENCOUNTER — Encounter: Payer: Self-pay | Admitting: Radiation Oncology

## 2013-03-28 VITALS — BP 133/74 | HR 80 | Temp 97.4°F | Wt 131.0 lb

## 2013-03-28 DIAGNOSIS — C787 Secondary malignant neoplasm of liver and intrahepatic bile duct: Secondary | ICD-10-CM | POA: Insufficient documentation

## 2013-03-28 DIAGNOSIS — Z923 Personal history of irradiation: Secondary | ICD-10-CM | POA: Insufficient documentation

## 2013-03-28 DIAGNOSIS — C189 Malignant neoplasm of colon, unspecified: Secondary | ICD-10-CM | POA: Insufficient documentation

## 2013-03-28 HISTORY — DX: Personal history of irradiation: Z92.3

## 2013-03-28 NOTE — Progress Notes (Signed)
Radiation Oncology         [phone] ________________________________  Name: April Spence MRN: [id number]  Date: 03/28/2013  DOB: February 20, 1956  Follow-Up Visit Note  CC: Nadean Corwin, MD  Ladene Artist, MD  Diagnosis:   Metastatic colon cancer with liver metastasis  Interval Since Last Radiation:  5 months   Narrative:  The patient returns today for routine follow-up.  The patient completed her course of stereotactic body radiotherapy to the liver on 10/28/2012. The patient has primarily had a problem recently with biliary fraction. The patient had an MRI scan of the liver completed on 02/21/2013. The treated right hepatic lobe measure 2.6 in maximum dimension which was down from or 0.0 cm previously. However a lesion within the medial segment of the left hepatic lobe was stable at 1 cm but was involved with central left hepatic lobe obstruction of the bile duct. The patient had a biliary stent placed on 02/27/2013. This was designed to relieve jaundice which had developed. The patient's anatomy is quite challenging and this placement of the stent was discussed in detail and GI conference. The drain was placed within the right sided biliary system rather than the left which was the goal and the patient still had obstruction from the primary area of concern. She therefore had a percutaneous biliary drain placed after this which has worked well but is difficult in terms of management of this and there is interest in trying to have this internalized. An attempt was unsuccessful and we therefore discussed and GI conference possible ways to still accomplish this goal. She did receive significant radiotherapy to the liver previously but given the size and location of the area of concern at this time, I do believe that palliative radiotherapy can be given to this area, potentially with sensitizing chemotherapy, to help shrink the tumor which may help on its own to relieve the instruction but  which also would help the placement of an internal stent.  The patient did also have a CT scan of the abdomen and pelvis completed on 03/11/2013. This again showed decrease size of the right hepatic lobe metastasis and no evidence of extrahepatic metastatic disease. The obstruction involving the central left hepatic duct measured 1.5 x 1.7 cm. No new left-sided lesions were identified.                             ALLERGIES:  is allergic to adhesive.  Meds: Current Outpatient Prescriptions  Medication Sig Dispense Refill  . Alum & Mag Hydroxide-Simeth (MAGIC MOUTHWASH) SOLN Take 10 mLs by mouth 4 (four) times daily as needed. Swish and spit  240 mL  2  . chlorhexidine (PERIDEX) 0.12 % solution Use as directed 15 mLs in the mouth or throat 2 (two) times daily.       . Cholecalciferol (VITAMIN D-3) 5000 UNITS TABS Take 5,000 mg by mouth 2 (two) times daily.      . diphenoxylate-atropine (LOMOTIL) 2.5-0.025 MG per tablet Take 1 tablet by mouth 4 (four) times daily as needed for diarrhea or loose stools.      Marland Kitchen levothyroxine (SYNTHROID, LEVOTHROID) 150 MCG tablet Take 150 mcg by mouth daily before breakfast.      . lidocaine-prilocaine (EMLA) cream Apply topically as needed. APPLY TO PORTACATH SITE 1-2 HOURS PRIOR TO USE  30 g  5  . LORazepam (ATIVAN) 0.5 MG tablet Take 1 tablet (0.5 mg total) by mouth every 12 (  twelve) hours as needed for anxiety.  30 tablet  2  . nystatin (NYSTOP) 100000 UNIT/GM POWD Apply 1 g topically daily as needed (for rash). In summer on irritated areas      . ondansetron (ZOFRAN) 8 MG tablet Take 1 tablet (8 mg total) by mouth every 12 (twelve) hours as needed for nausea.  20 tablet  2  . oxyCODONE-acetaminophen (PERCOCET/ROXICET) 5-325 MG per tablet Take 1 tablet by mouth every 4 (four) hours as needed for pain.  50 tablet  0  . prochlorperazine (COMPAZINE) 10 MG tablet Take 10 mg by mouth every 6 (six) hours as needed (nausea).       . propranolol (INDERAL) 80 MG tablet  Take 80 mg by mouth every morning.       . traMADol-acetaminophen (ULTRACET) 37.5-325 MG per tablet Take 1 tablet by mouth every 6 (six) hours as needed for pain.       Marland Kitchen amitriptyline (ELAVIL) 25 MG tablet Take 25 mg by mouth at bedtime.       . fenofibrate micronized (LOFIBRA) 134 MG capsule Take 134 mg by mouth every evening.       Marland Kitchen FLUoxetine (PROZAC) 20 MG capsule Take 20 mg by mouth every morning.       Marland Kitchen PRESCRIPTION MEDICATION Pt gets chemotherapy at [facility]. Last treatment was on 02-11-13. Pt missed last treatment due to endo procedure. Followed by Dr Truett Perna       No current facility-administered medications for this encounter.   Facility-Administered Medications Ordered in Other Encounters  Medication Dose Route Frequency Provider Last Rate Last Dose  . fluorouracil (ADRUCIL) 2,600 mg in sodium chloride 0.9 % 150 mL chemo infusion  1,475 mg/m2 (Treatment Plan Actual) Intravenous 1 day or 1 dose Ladene Artist, MD   2,600 mg at 11/12/12 1603  . heparin lock flush 100 unit/mL  500 Units Intracatheter Once Ladene Artist, MD      . sodium chloride 0.9 % injection 10 mL  10 mL Intracatheter PRN Ladene Artist, MD        Physical Findings: The patient is in no acute distress. Patient is alert and oriented.  weight is 131 lb (59.421 kg). Her temperature is 97.4 F (36.3 C). Her blood pressure is 133/74 and her pulse is 80. Marland Kitchen   General: Well-developed, in no acute distress, sitting in a wheelchair HEENT: Normocephalic, atraumatic Cardiovascular: Regular rate and rhythm Respiratory: Clear to auscultation bilaterally GI: Soft, nontender, normal bowel sounds; percutaneous biliary drain in place within the left abdomen Extremities: No edema present   Lab Findings: Lab Results  Component Value Date   WBC 7.2 03/21/2013   HGB 14.2 03/21/2013   HCT 43.6 03/21/2013   MCV 99.8 03/21/2013   PLT 233 03/21/2013     Radiographic Findings: Ct Abdomen Pelvis W Contrast  03/11/2013   *RADIOLOGY  REPORT*  Clinical Data: Increasing bilirubin.  Nausea.  History of colon cancer with metastases to liver.  Radiation therapy chemotherapy. Recent biliary stent placement.  CT ABDOMEN AND PELVIS WITH CONTRAST  Technique:  Multidetector CT imaging of the abdomen and pelvis was performed following the standard protocol during bolus administration of intravenous contrast.  Contrast: OMNIPAQUE IOHEXOL 300 MG/ML  SOLN  Comparison: ERCP of 02/27/2013.  MRI of 02/21/2013.  Most recent CT of 08/15/2012.  Findings: Lung bases:  Normal  Abdomen/pelvis:  The right liver lobe lesion described on 02/21/2013 is improved and partially obscured by the tip of the biliary  stent.  Likely measures on the order of 2.2 cm on image 16/series 2.  2.6 cm of same level on the prior.  Mild cephalad right-sided intrahepatic biliary ductal dilatation has resolved.  The moderate left intrahepatic biliary ductal dilatation is similar to 02/21/2013.  For example, index intrahepatic duct measures 9 mm on image 22/series 2 versus 1.0 cm at same level on the prior exam. The common duct stent terminates proximally in the central right hepatic duct.  The obstruction involving the central left hepatic duct is separate, and measures 1.5 x 1.7 cm on image 20/series 2. No new left-sided lesions are identified.  Small splenule.  Anatomic distortion secondary to extent of spinal curvature.  Mild pancreatic atrophy.  The biliary stent terminates at the level of the ampulla.  Portal veins patent.  Hepatic veins not well evaluated.  Cholecystectomy.  Normal adrenal glands.  Normal kidneys.  Retroaortic left renal vein.  No retroperitoneal adenopathy.  Large amount of colonic stool.  Surgical changes of right hemicolectomy.  Normal small bowel without abdominal ascites.    No evidence of omental or peritoneal disease.  No pelvic adenopathy.  Normal urinary bladder.  Probable exophytic left sided dystrophic uterine fibroid.  This is unchanged. No adnexal  mass or significant free fluid.  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  IMPRESSION:  1.  Since 02/21/2013, placement of a biliary stent.  This has its cephalad portion in the right hepatic ductal system, and does not traverse the previously described central left hepatic lobe stricture.  The stricture is similar in size and causes similar moderate left sided intrahepatic ductal dilatation. 2.  Decreased size of a right hepatic lobe metastasis, partially obscured by the tip of the biliary stent.  Ductal dilatation in this area has resolved. These results will be called to the ordering clinician or representative by the Radiologist Assistant, and communication documented in the PACS Dashboard. 3.  No evidence of extrahepatic metastatic disease. 4. Possible constipation. 5.  Distorted anatomy secondary to the extent of spinal curvature.   Original Report Authenticated By: Jeronimo Greaves, M.D.   Ir Cholan Exist Tube  03/21/2013   *RADIOLOGY REPORT*  Clinical Data: Metastatic colon carcinoma to the liver causing biliary obstruction.  The patient is status post prior endoscopic placement of a metal stent extending up into the right sided bile ducts on 02/27/2013 and percutaneous access of the left lobe of the liver with placement of an external biliary drain on 03/13/2013.  1.  CHOLANGIOGRAM THROUGH PREEXISTING BILIARY DRAINAGE CATHETER. 2.  EXCHANGE OF PERCUTANEOUS BILIARY DRAINAGE CATHETER.  Comparison:  Most recent procedure on 03/13/2013 as well as multiple other prior imaging.  Sedation: 4.0 mg IV Versed; 200 mcg IV Fentanyl.  Total Moderate Sedation Time: 50 minutes.  Contrast:  30 ml Omnipaque-300  Additional Medications: 400 mg IV Cipro.  Ciprofloxacin was given within two hours of incision.  Fluoroscopy Time: 13 minutes and 18 seconds.  Procedure:  The procedure, risks, benefits, and alternatives were explained to the patient.  Questions regarding the  procedure were encouraged and answered.  The patient understands and consents to the procedure.  The preexisting biliary drainage catheter and surrounding skin was prepped with Betadine in a sterile fashion, and a sterile drape was applied covering the operative field.  A sterile gown and sterile gloves were used for the procedure. Local anesthesia was provided with 1% Lidocaine.  The preexisting 8-French biliary drainage catheter was initially injected with contrast material  and imaging performed.  The catheter was then removed over a guidewire.  A 6-French sheath was advanced into the biliary system.  A 5-French catheter was then advanced over a guidewire into the left intrahepatic ducts. Additional cholangiography was performed through the 5-French catheter with multiple fluoroscopic spot images obtained.  The catheter was further advanced over different hydrophilic guide wires into the central biliary tree.  Catheter and guide wire advancement was attempted with multiple different types of catheters and wires.  The catheter and sheath were removed over a guidewire.  A new 10- [place] external drainage catheter was advanced over a guidewire with positioning confirmed by fluoroscopy after contrast injection. The catheter was flushed and connected to a gravity drainage bag. It was secured at the skin with a Prolene retention suture.  Complications: None  Findings: Initial cholangiogram demonstrates the preexisting external drain and a peripheral left intrahepatic duct.  The left lobe biliary tree is decompressed.  There is no initial opacification of central left sided ducts.  With further catheter advancement, there was evidence of a high- grade obstruction at the confluence between right and left intrahepatic ducts at the level of the previously placed metallic biliary stent.  Contrast injection shows no significant antegrade flow of contrast in the common bile duct from the left ducts. Although some guidewires  were able to be just advanced into the common bile duct, the catheter would not advance over a wire to allow further access to the level of the duodenum.  Internalized metallic stenting was therefore unable to be performed today.  In order to allow bilirubin to decrease further to allow additional chemotherapy, the external drainage catheter was upsized to 10- [place] and positioned in the central left biliary tree.  This will be left to gravity drainage.  IMPRESSION: Inability to cross a high-grade central obstruction of the left hepatic ducts due to a known obstructing metastatic lesion. External biliary drainage was upsized to 10-French with the catheter further advanced into the central aspect of the left lobe. This will be left to gravity drainage for the time being.  It probably is not worth reattempting to cross the malignant obstruction until the patient can receive further therapy to try to shrink the metastatic lesion.  The indwelling biliary drain should be routinely changed out in roughly 4 to 6 weeks.   Original Report Authenticated By: Irish Lack, M.D.   Ir Perc Biliary Drain-ext Only  03/13/2013   *RADIOLOGY REPORT*  IR PERCUTANEOUS BILIARY DRAIN EXTERNAL ONLY; IR ULTRASOUND GUIDANCE; IR PERCUTANEOUS TRANSHEPATIC CHOLANGIOGRAM  Date: 03/13/2013  Clinical History: 56-year female with metastatic colorectal cancer and malignant obstructed jaundice. She has a permanent no biliary stent in her right biliary tree.  However the left biliary tree remains completely obstructed by a central mass lesion. She requires percutaneous transhepatic cholangiogram and biliary drain placement for decompression.  Procedures Performed: 1. Percutaneous transhepatic cholangiogram 2.  Placement of an 8-French externalized biliary drain  Interventional Radiologist:  Sterling Big, MD  Sedation: Moderate (conscious) sedation was used.  Three mg Versed, 100 mcg Fentanyl were administered intravenously.  The patient's  vital signs were monitored continuously by radiology nursing throughout the procedure.  Sedation Time: 30 minutes  Fluoroscopy time: 8 minutes 24 seconds  Contrast volume: 25 ml Omnipaque-300 administered into the biliary tree  PROCEDURE/FINDINGS:   Informed consent was obtained from the patient following explanation of the procedure, risks, benefits and alternatives. The patient understands, agrees and consents for the procedure. All questions were  addressed. A time out was performed.  Maximal barrier sterile technique utilized including caps, mask, sterile gowns, sterile gloves, large sterile drape, hand hygiene, and  betadine skin prep.  The left upper quadrant was interrogated with ultrasound.  Dilated biliary ducts were successfully identified.  Anesthesia was attained by infiltration of 1% lidocaine.  Under direct sonographic guidance, a 21 gauge Accustick needle was carefully advanced into the liver used to puncture a left biliary radicle.  A percutaneous transhepatic cholangiogram was then performed. There is severe intrahepatic biliary ductal dilatation.  The left main biliary ducts appears completely occluded.  There is no communication of the biliary tree with the previously placed metal stent.  The initial access was slightly central.  Therefore, using a combination of ultrasound and fluoroscopic guidance, a second percutaneous biliary access was obtained again using a 21-gauge micropuncture.  The tertiary biliary radicle in the inferior left hepatic lobe (segment three) was successfully punctured.  The Accustick sheath was advanced over a wire. Attempts were made to identify the main left hepatic duct using a 4-French angled glide catheter and glide wire through the Accustick sheath.  This was ultimately unsuccessful. And 8-French Cook all-purpose drainage catheter was therefore advanced over a wire and positioned within the central left biliary ducts.  The catheter was secured to the skin with O  Prolene suture.  Clear minimally viscous bile was readily aspirated through the tube.  The tube was connected to gravity drainage.  There was no immediate complication, the patient tolerated the procedure well.  IMPRESSION:  1.  Percutaneous transhepatic cholangiogram demonstrates marked dilatation of the left biliary system with complete occlusion of the left main biliary duct.  The segment II, III, 4A and 4B ducts are all dilated.  2. Placement of a 8-French percutaneous biliary drain positioned within the central left hepatic ducts.  The drain was left external gravity drainage.  3.  Plan:  Recommend continued external drainage and decompression of the left biliary system.  The patient can return to interventional radiology in 1 - 2 weeks for another attempt at internalization of the percutaneous biliary drain.  Signed,  Sterling Big, MD Vascular & Interventional Radiologist [facility]   Original Report Authenticated By: Malachy Moan, M.D.   Ir Catheter Tube Change  03/21/2013   *RADIOLOGY REPORT*  Clinical Data: Metastatic colon carcinoma to the liver causing biliary obstruction.  The patient is status post prior endoscopic placement of a metal stent extending up into the right sided bile ducts on 02/27/2013 and percutaneous access of the left lobe of the liver with placement of an external biliary drain on 03/13/2013.  1.  CHOLANGIOGRAM THROUGH PREEXISTING BILIARY DRAINAGE CATHETER. 2.  EXCHANGE OF PERCUTANEOUS BILIARY DRAINAGE CATHETER.  Comparison:  Most recent procedure on 03/13/2013 as well as multiple other prior imaging.  Sedation: 4.0 mg IV Versed; 200 mcg IV Fentanyl.  Total Moderate Sedation Time: 50 minutes.  Contrast:  30 ml Omnipaque-300  Additional Medications: 400 mg IV Cipro.  Ciprofloxacin was given within two hours of incision.  Fluoroscopy Time: 13 minutes and 18 seconds.  Procedure:  The procedure, risks, benefits, and alternatives were explained to the patient.   Questions regarding the procedure were encouraged and answered.  The patient understands and consents to the procedure.  The preexisting biliary drainage catheter and surrounding skin was prepped with Betadine in a sterile fashion, and a sterile drape was applied covering the operative field.  A sterile gown and sterile gloves were used for the  procedure. Local anesthesia was provided with 1% Lidocaine.  The preexisting 8-French biliary drainage catheter was initially injected with contrast material and imaging performed.  The catheter was then removed over a guidewire.  A 6-French sheath was advanced into the biliary system.  A 5-French catheter was then advanced over a guidewire into the left intrahepatic ducts. Additional cholangiography was performed through the 5-French catheter with multiple fluoroscopic spot images obtained.  The catheter was further advanced over different hydrophilic guide wires into the central biliary tree.  Catheter and guide wire advancement was attempted with multiple different types of catheters and wires.  The catheter and sheath were removed over a guidewire.  A new 10- [place] external drainage catheter was advanced over a guidewire with positioning confirmed by fluoroscopy after contrast injection. The catheter was flushed and connected to a gravity drainage bag. It was secured at the skin with a Prolene retention suture.  Complications: None  Findings: Initial cholangiogram demonstrates the preexisting external drain and a peripheral left intrahepatic duct.  The left lobe biliary tree is decompressed.  There is no initial opacification of central left sided ducts.  With further catheter advancement, there was evidence of a high- grade obstruction at the confluence between right and left intrahepatic ducts at the level of the previously placed metallic biliary stent.  Contrast injection shows no significant antegrade flow of contrast in the common bile duct from the left ducts.  Although some guidewires were able to be just advanced into the common bile duct, the catheter would not advance over a wire to allow further access to the level of the duodenum.  Internalized metallic stenting was therefore unable to be performed today.  In order to allow bilirubin to decrease further to allow additional chemotherapy, the external drainage catheter was upsized to 10- [place] and positioned in the central left biliary tree.  This will be left to gravity drainage.  IMPRESSION: Inability to cross a high-grade central obstruction of the left hepatic ducts due to a known obstructing metastatic lesion. External biliary drainage was upsized to 10-French with the catheter further advanced into the central aspect of the left lobe. This will be left to gravity drainage for the time being.  It probably is not worth reattempting to cross the malignant obstruction until the patient can receive further therapy to try to shrink the metastatic lesion.  The indwelling biliary drain should be routinely changed out in roughly 4 to 6 weeks.   Original Report Authenticated By: Irish Lack, M.D.   Ir Ptc  03/13/2013   *RADIOLOGY REPORT*  IR PERCUTANEOUS BILIARY DRAIN EXTERNAL ONLY; IR ULTRASOUND GUIDANCE; IR PERCUTANEOUS TRANSHEPATIC CHOLANGIOGRAM  Date: 03/13/2013  Clinical History: 56-year female with metastatic colorectal cancer and malignant obstructed jaundice. She has a permanent no biliary stent in her right biliary tree.  However the left biliary tree remains completely obstructed by a central mass lesion. She requires percutaneous transhepatic cholangiogram and biliary drain placement for decompression.  Procedures Performed: 1. Percutaneous transhepatic cholangiogram 2.  Placement of an 8-French externalized biliary drain  Interventional Radiologist:  Sterling Big, MD  Sedation: Moderate (conscious) sedation was used.  Three mg Versed, 100 mcg Fentanyl were administered intravenously.  The patient's  vital signs were monitored continuously by radiology nursing throughout the procedure.  Sedation Time: 30 minutes  Fluoroscopy time: 8 minutes 24 seconds  Contrast volume: 25 ml Omnipaque-300 administered into the biliary tree  PROCEDURE/FINDINGS:   Informed consent was obtained from the patient following explanation of  the procedure, risks, benefits and alternatives. The patient understands, agrees and consents for the procedure. All questions were addressed. A time out was performed.  Maximal barrier sterile technique utilized including caps, mask, sterile gowns, sterile gloves, large sterile drape, hand hygiene, and  betadine skin prep.  The left upper quadrant was interrogated with ultrasound.  Dilated biliary ducts were successfully identified.  Anesthesia was attained by infiltration of 1% lidocaine.  Under direct sonographic guidance, a 21 gauge Accustick needle was carefully advanced into the liver used to puncture a left biliary radicle.  A percutaneous transhepatic cholangiogram was then performed. There is severe intrahepatic biliary ductal dilatation.  The left main biliary ducts appears completely occluded.  There is no communication of the biliary tree with the previously placed metal stent.  The initial access was slightly central.  Therefore, using a combination of ultrasound and fluoroscopic guidance, a second percutaneous biliary access was obtained again using a 21-gauge micropuncture.  The tertiary biliary radicle in the inferior left hepatic lobe (segment three) was successfully punctured.  The Accustick sheath was advanced over a wire. Attempts were made to identify the main left hepatic duct using a 4-French angled glide catheter and glide wire through the Accustick sheath.  This was ultimately unsuccessful. And 8-French Cook all-purpose drainage catheter was therefore advanced over a wire and positioned within the central left biliary ducts.  The catheter was secured to the skin with O  Prolene suture.  Clear minimally viscous bile was readily aspirated through the tube.  The tube was connected to gravity drainage.  There was no immediate complication, the patient tolerated the procedure well.  IMPRESSION:  1.  Percutaneous transhepatic cholangiogram demonstrates marked dilatation of the left biliary system with complete occlusion of the left main biliary duct.  The segment II, III, 4A and 4B ducts are all dilated.  2. Placement of a 8-French percutaneous biliary drain positioned within the central left hepatic ducts.  The drain was left external gravity drainage.  3.  Plan:  Recommend continued external drainage and decompression of the left biliary system.  The patient can return to interventional radiology in 1 - 2 weeks for another attempt at internalization of the percutaneous biliary drain.  Signed,  Sterling Big, MD Vascular & Interventional Radiologist [facility]   Original Report Authenticated By: Malachy Moan, M.D.   Ir US Guide Bx Asp/drain  03/13/2013   *RADIOLOGY REPORT*  IR PERCUTANEOUS BILIARY DRAIN EXTERNAL ONLY; IR ULTRASOUND GUIDANCE; IR PERCUTANEOUS TRANSHEPATIC CHOLANGIOGRAM  Date: 03/13/2013  Clinical History: 56-year female with metastatic colorectal cancer and malignant obstructed jaundice. She has a permanent no biliary stent in her right biliary tree.  However the left biliary tree remains completely obstructed by a central mass lesion. She requires percutaneous transhepatic cholangiogram and biliary drain placement for decompression.  Procedures Performed: 1. Percutaneous transhepatic cholangiogram 2.  Placement of an 8-French externalized biliary drain  Interventional Radiologist:  Sterling Big, MD  Sedation: Moderate (conscious) sedation was used.  Three mg Versed, 100 mcg Fentanyl were administered intravenously.  The patient's vital signs were monitored continuously by radiology nursing throughout the procedure.  Sedation Time: 30 minutes   Fluoroscopy time: 8 minutes 24 seconds  Contrast volume: 25 ml Omnipaque-300 administered into the biliary tree  PROCEDURE/FINDINGS:   Informed consent was obtained from the patient following explanation of the procedure, risks, benefits and alternatives. The patient understands, agrees and consents for the procedure. All questions were addressed. A time out was performed.  Maximal barrier sterile technique utilized including caps, mask, sterile gowns, sterile gloves, large sterile drape, hand hygiene, and  betadine skin prep.  The left upper quadrant was interrogated with ultrasound.  Dilated biliary ducts were successfully identified.  Anesthesia was attained by infiltration of 1% lidocaine.  Under direct sonographic guidance, a 21 gauge Accustick needle was carefully advanced into the liver used to puncture a left biliary radicle.  A percutaneous transhepatic cholangiogram was then performed. There is severe intrahepatic biliary ductal dilatation.  The left main biliary ducts appears completely occluded.  There is no communication of the biliary tree with the previously placed metal stent.  The initial access was slightly central.  Therefore, using a combination of ultrasound and fluoroscopic guidance, a second percutaneous biliary access was obtained again using a 21-gauge micropuncture.  The tertiary biliary radicle in the inferior left hepatic lobe (segment three) was successfully punctured.  The Accustick sheath was advanced over a wire. Attempts were made to identify the main left hepatic duct using a 4-French angled glide catheter and glide wire through the Accustick sheath.  This was ultimately unsuccessful. And 8-French Cook all-purpose drainage catheter was therefore advanced over a wire and positioned within the central left biliary ducts.  The catheter was secured to the skin with O Prolene suture.  Clear minimally viscous bile was readily aspirated through the tube.  The tube was connected to gravity  drainage.  There was no immediate complication, the patient tolerated the procedure well.  IMPRESSION:  1.  Percutaneous transhepatic cholangiogram demonstrates marked dilatation of the left biliary system with complete occlusion of the left main biliary duct.  The segment II, III, 4A and 4B ducts are all dilated.  2. Placement of a 8-French percutaneous biliary drain positioned within the central left hepatic ducts.  The drain was left external gravity drainage.  3.  Plan:  Recommend continued external drainage and decompression of the left biliary system.  The patient can return to interventional radiology in 1 - 2 weeks for another attempt at internalization of the percutaneous biliary drain.  Signed,  Sterling Big, MD Vascular & Interventional Radiologist [facility]iology   Original Report Authenticated By: Malachy Moan, M.D.   Dg Ercp  02/27/2013   *RADIOLOGY REPORT*  Clinical Data: History of metastatic colon carcinoma with liver metastases, biliary obstruction and jaundice.  ERCP  Comparison:  None.  Technique:  Multiple spot images obtained with the fluoroscopic device and submitted for interpretation post-procedure.  ERCP was performed by Dr. Christella Hartigan.  Findings: Imaging during the endoscopic procedure demonstrates cannulation of the common bile duct.  Injection shows evidence of irregular and dilated biliary ducts in the left lobe of the liver with a focal structure in the liver at the level of the central left duct.  Balloon dilatation was performed of the central segment of the left intrahepatic bile duct.  A self-expanding noncovered metallic stent was then placed extending to the left lobe of the liver and across the stricture to the level of the common bile duct.  The stent shows normal patency.  IMPRESSION: Imaging shows evidence of left intrahepatic biliary ductal obstruction due to a stricture of the central left intrahepatic bile duct.  This was treated with balloon dilatation and  placement of a self-expanding noncovered metal stent.  These images were submitted for radiologic interpretation only. Please see the procedural report for the amount of contrast and the fluoroscopy time utilized.   Original Report Authenticated By: Irish Lack, M.D.  Impression:    The patient is a very pleasant 57 year old female with metastatic colon cancer with liver metastasis. She has had difficulty with significant biliary obstruction. One internal stent has been placed and a percutaneous drain is present on the left. 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 We discussed the GI conference that palliative radiotherapy to this area may help to shrink the tumor and make it easier for stent placement. The patient is also seeing Dr. Truett Perna and medical oncology next week and he will to discuss with the patient possible sensitizing chemotherapy which could be given with radiotherapy.  I discussed with the patient the potential 2-3 week course of palliative radiotherapy. This would be a very focal treatment to the area in question. The more fractionated course of radiotherapy at this point would be in my opinion better tolerated than a shorter, for instance 5 fraction, treatment. I would plan for 14 fractions with concurrent chemotherapy. We discussed the increased risk of radiation induced liver damage given her prior treatment. However given the specifics of her case including the prior dose to the liver and the timeframe of treatment, as well as the size of the current tumor, I believe that it would be reasonable to give focal treatment to this area as noted above. We also discussed the additional side effects including fatigue, skin irritation, nausea, and loose stools which typically would occur acutely during treatment. Given the location of the tumor however, I believe that  this would be well tolerated during the treatment.  Plan:  The patient indicated that she did wish to proceed with treatment. She will be scheduled for a simulation as soon as possible. She will see Dr. Truett Perna next week and we will coordinate multimodality treatment.  I spent 25 minutes with the patient today, the majority of which was spent counseling the patient on the diagnosis of cancer and coordinating care.   Radene Gunning, M.D., Ph.D.

## 2013-03-28 NOTE — Progress Notes (Signed)
GI Location of Tumor / Histology:  Obstructing mass in the left liver  Patient presented with symptoms of: right abdomen pain, jaundice, dark urine and light colored stools that started at the beginning of 02/2013.  Past/Anticipated interventions by surgeon, if any: biliary stent placement 02/27/2013, percutaneous billiary drain placed then upsized on 03/2013  Past/Anticipated interventions by medical oncology, if any: unknown  Weight changes, if any: 5 lb weight loss  Bowel/Bladder complaints, if any: was constipated from percocet and used a supository and now has diarrhea.  Nausea / Vomiting, if any: Nausea started Monday and she has vomited twice since then.  Pain issues, if any:  Pain in right abdomen.  Takes percocet as needed.  SAFETY ISSUES:  Prior radiation? Yes - 52 gray to liver metastasis - stereotactic body radiotherapy - 10/21/2012-10/28/2012  Pacemaker/ICD? no  Possible current pregnancy? no  Is the patient on methotrexate? no  Current Complaints / other details:  Rhythm Gubbels here with her husband for reconsult for her left liver mass.  She does have pain that she is rating at a 5/10 in her right side/lower back.  She is fatigued.  She does have a biliary drain.  She reports that the Dr earlier in the week tried to internalize it but it was not successful.

## 2013-03-28 NOTE — Progress Notes (Signed)
Please see the Nurse Progress Note in the MD Initial Consult Encounter for this patient. 

## 2013-04-01 ENCOUNTER — Telehealth: Payer: Self-pay | Admitting: *Deleted

## 2013-04-01 ENCOUNTER — Ambulatory Visit (HOSPITAL_BASED_OUTPATIENT_CLINIC_OR_DEPARTMENT_OTHER): Payer: Medicare HMO

## 2013-04-01 ENCOUNTER — Ambulatory Visit (HOSPITAL_BASED_OUTPATIENT_CLINIC_OR_DEPARTMENT_OTHER): Payer: Medicare HMO | Admitting: Nurse Practitioner

## 2013-04-01 ENCOUNTER — Telehealth: Payer: Self-pay | Admitting: Oncology

## 2013-04-01 ENCOUNTER — Other Ambulatory Visit (HOSPITAL_BASED_OUTPATIENT_CLINIC_OR_DEPARTMENT_OTHER): Payer: Medicare HMO | Admitting: Lab

## 2013-04-01 VITALS — BP 123/79 | HR 72 | Temp 97.6°F | Resp 18 | Ht 60.0 in | Wt 129.2 lb

## 2013-04-01 DIAGNOSIS — C787 Secondary malignant neoplasm of liver and intrahepatic bile duct: Secondary | ICD-10-CM

## 2013-04-01 DIAGNOSIS — C189 Malignant neoplasm of colon, unspecified: Secondary | ICD-10-CM

## 2013-04-01 DIAGNOSIS — R933 Abnormal findings on diagnostic imaging of other parts of digestive tract: Secondary | ICD-10-CM

## 2013-04-01 DIAGNOSIS — C801 Malignant (primary) neoplasm, unspecified: Secondary | ICD-10-CM

## 2013-04-01 DIAGNOSIS — C19 Malignant neoplasm of rectosigmoid junction: Secondary | ICD-10-CM

## 2013-04-01 LAB — CBC WITH DIFFERENTIAL/PLATELET
BASO%: 0.2 % (ref 0.0–2.0)
Basophils Absolute: 0 10*3/uL (ref 0.0–0.1)
EOS%: 2.6 % (ref 0.0–7.0)
HCT: 42.6 % (ref 34.8–46.6)
HGB: 14.5 g/dL (ref 11.6–15.9)
LYMPH%: 11.7 % — ABNORMAL LOW (ref 14.0–49.7)
MCH: 33.5 pg (ref 25.1–34.0)
MCHC: 34.2 g/dL (ref 31.5–36.0)
MCV: 98 fL (ref 79.5–101.0)
NEUT%: 74.7 % (ref 38.4–76.8)
Platelets: 238 10*3/uL (ref 145–400)
lymph#: 1 10*3/uL (ref 0.9–3.3)

## 2013-04-01 LAB — COMPREHENSIVE METABOLIC PANEL (CC13)
ALT: 29 U/L (ref 0–55)
AST: 61 U/L — ABNORMAL HIGH (ref 5–34)
BUN: 10.1 mg/dL (ref 7.0–26.0)
Calcium: 9.8 mg/dL (ref 8.4–10.4)
Chloride: 101 mEq/L (ref 98–109)
Creatinine: 0.6 mg/dL (ref 0.6–1.1)
Total Bilirubin: 3.72 mg/dL — ABNORMAL HIGH (ref 0.20–1.20)

## 2013-04-01 MED ORDER — SODIUM CHLORIDE 0.9 % IJ SOLN
10.0000 mL | INTRAMUSCULAR | Status: DC | PRN
  Administered 2013-04-01: 10 mL via INTRAVENOUS
  Filled 2013-04-01: qty 10

## 2013-04-01 MED ORDER — ALPRAZOLAM 0.25 MG PO TABS: 0.2500 mg | ORAL_TABLET | Freq: Two times a day (BID) | ORAL | Status: DC | PRN

## 2013-04-01 MED ORDER — HEPARIN SOD (PORK) LOCK FLUSH 100 UNIT/ML IV SOLN
500.0000 [IU] | Freq: Once | INTRAVENOUS | Status: AC
  Administered 2013-04-01: 500 [IU] via INTRAVENOUS
  Filled 2013-04-01: qty 5

## 2013-04-01 NOTE — Progress Notes (Addendum)
OFFICE PROGRESS NOTE  Interval history:  April Spence returns as scheduled. She underwent a cholangiogram through the biliary drainage catheter in interventional radiology on 03/21/2013. There was evidence of a high-grade obstruction at the confluence between the right and left intrahepatic ducts at the level of the previously placed metallic biliary stent. The catheter could not be advanced. The external drainage catheter was upsized.  She has seen Dr. Mitzi Hansen. A 2-3 week course of palliative radiotherapy was recommended.  She is seen today for scheduled followup. She has intermittent pain at the right abdomen. She takes Percocet as needed. She recently became constipated. She decreased use of Percocet with improvement. She continues to note improvement in the jaundice. She denies pruritus. She has periodic nausea/vomiting. She is having some anxiety. She tried a Xanax tablet of her husband's with improvement. She reports Prozac was recently discontinued.   Objective: Blood pressure 123/79, pulse 72, temperature 97.6 F (36.4 C), temperature source Oral, resp. rate 18, height 5' (1.524 m), weight 129 lb 3.2 oz (58.605 kg).  No thrush. Lungs clear. Regular cardiac rhythm. Port-A-Cath site without erythema. Abdomen soft. Biliary drain left abdomen. Extremities without edema. Jaundice appears improved.  Lab Results: Lab Results  Component Value Date   WBC 8.2 04/01/2013   HGB 14.5 04/01/2013   HCT 42.6 04/01/2013   MCV 98.0 04/01/2013   PLT 238 04/01/2013    Chemistry:    Chemistry      Component Value Date/Time   NA 135* 04/01/2013 1008   NA 132* 03/21/2013 0940   NA 136 05/02/2011 0954   K 4.1 04/01/2013 1008   K 4.0 03/21/2013 0940   K 4.5 05/02/2011 0954   CL 97 03/21/2013 0940   CL 99 02/25/2013 1124   CL 93* 05/02/2011 0954   CO2 26 04/01/2013 1008   CO2 26 03/21/2013 0940   CO2 29 05/02/2011 0954   BUN 10.1 04/01/2013 1008   BUN 7 03/21/2013 0940   BUN 12 05/02/2011 0954   CREATININE 0.6  04/01/2013 1008   CREATININE 0.32* 03/21/2013 0940   CREATININE 0.5* 05/02/2011 0954      Component Value Date/Time   CALCIUM 9.8 04/01/2013 1008   CALCIUM 10.0 03/21/2013 0940   CALCIUM 9.3 05/02/2011 0954   ALKPHOS 198* 04/01/2013 1008   ALKPHOS 184* 03/21/2013 0940   ALKPHOS 78 05/02/2011 0954   AST 61* 04/01/2013 1008   AST 71* 03/21/2013 0940   AST 31 05/02/2011 0954   ALT 29 04/01/2013 1008   ALT 31 03/21/2013 0940   BILITOT 3.72* 04/01/2013 1008   BILITOT 6.8* 03/21/2013 0940   BILITOT 0.40 05/02/2011 0954       Studies/Results: Ct Abdomen Pelvis W Contrast  03/11/2013   *RADIOLOGY REPORT*  Clinical Data: Increasing bilirubin.  Nausea.  History of colon cancer with metastases to liver.  Radiation therapy chemotherapy. Recent biliary stent placement.  CT ABDOMEN AND PELVIS WITH CONTRAST  Technique:  Multidetector CT imaging of the abdomen and pelvis was performed following the standard protocol during bolus administration of intravenous contrast.  Contrast: OMNIPAQUE IOHEXOL 300 MG/ML  SOLN  Comparison: ERCP of 02/27/2013.  MRI of 02/21/2013.  Most recent CT of 08/15/2012.  Findings: Lung bases:  Normal  Abdomen/pelvis:  The right liver lobe lesion described on 02/21/2013 is improved and partially obscured by the tip of the biliary stent.  Likely measures on the order of 2.2 cm on image 16/series 2.  2.6 cm of same level on the prior.  Mild cephalad right-sided intrahepatic biliary ductal dilatation has resolved.  The moderate left intrahepatic biliary ductal dilatation is similar to 02/21/2013.  For example, index intrahepatic duct measures 9 mm on image 22/series 2 versus 1.0 cm at same level on the prior exam. The common duct stent terminates proximally in the central right hepatic duct.  The obstruction involving the central left hepatic duct is separate, and measures 1.5 x 1.7 cm on image 20/series 2. No new left-sided lesions are identified.  Small splenule.  Anatomic distortion secondary to  extent of spinal curvature.  Mild pancreatic atrophy.  The biliary stent terminates at the level of the ampulla.  Portal veins patent.  Hepatic veins not well evaluated.  Cholecystectomy.  Normal adrenal glands.  Normal kidneys.  Retroaortic left renal vein.  No retroperitoneal adenopathy.  Large amount of colonic stool.  Surgical changes of right hemicolectomy.  Normal small bowel without abdominal ascites.    No evidence of omental or peritoneal disease.  No pelvic adenopathy.  Normal urinary bladder.  Probable exophytic left sided dystrophic uterine fibroid.  This is unchanged. No adnexal mass or significant free fluid.  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  IMPRESSION:  1.  Since 02/21/2013, placement of a biliary stent.  This has its cephalad portion in the right hepatic ductal system, and does not traverse the previously described central left hepatic lobe stricture.  The stricture is similar in size and causes similar moderate left sided intrahepatic ductal dilatation. 2.  Decreased size of a right hepatic lobe metastasis, partially obscured by the tip of the biliary stent.  Ductal dilatation in this area has resolved. These results will be called to the ordering clinician or representative by the Radiologist Assistant, and communication documented in the PACS Dashboard. 3.  No evidence of extrahepatic metastatic disease. 4. Possible constipation. 5.  Distorted anatomy secondary to the extent of spinal curvature.   Original Report Authenticated By: Jeronimo Greaves, M.D.   Ir Cholan Exist Tube  03/21/2013   *RADIOLOGY REPORT*  Clinical Data: Metastatic colon carcinoma to the liver causing biliary obstruction.  The patient is status post prior endoscopic placement of a metal stent extending up into the right sided bile ducts on 02/27/2013 and percutaneous access of the left lobe of the liver with placement of an external biliary drain on 03/13/2013.  1.   CHOLANGIOGRAM THROUGH PREEXISTING BILIARY DRAINAGE CATHETER. 2.  EXCHANGE OF PERCUTANEOUS BILIARY DRAINAGE CATHETER.  Comparison:  Most recent procedure on 03/13/2013 as well as multiple other prior imaging.  Sedation: 4.0 mg IV Versed; 200 mcg IV Fentanyl.  Total Moderate Sedation Time: 50 minutes.  Contrast:  30 ml Omnipaque-300  Additional Medications: 400 mg IV Cipro.  Ciprofloxacin was given within two hours of incision.  Fluoroscopy Time: 13 minutes and 18 seconds.  Procedure:  The procedure, risks, benefits, and alternatives were explained to the patient.  Questions regarding the procedure were encouraged and answered.  The patient understands and consents to the procedure.  The preexisting biliary drainage catheter and surrounding skin was prepped with Betadine in a sterile fashion, and a sterile drape was applied covering the operative field.  A sterile gown and sterile gloves were used for the procedure. Local anesthesia was provided with 1% Lidocaine.  The preexisting 8-French biliary drainage catheter was initially injected with contrast material and imaging performed.  The catheter was then removed over a guidewire.  A 6-French sheath was advanced into the biliary system.  A  5-French catheter was then advanced over a guidewire into the left intrahepatic ducts. Additional cholangiography was performed through the 5-French catheter with multiple fluoroscopic spot images obtained.  The catheter was further advanced over different hydrophilic guide wires into the central biliary tree.  Catheter and guide wire advancement was attempted with multiple different types of catheters and wires.  The catheter and sheath were removed over a guidewire.  A new 10- [place] external drainage catheter was advanced over a guidewire with positioning confirmed by fluoroscopy after contrast injection. The catheter was flushed and connected to a gravity drainage bag. It was secured at the skin with a Prolene retention suture.   Complications: None  Findings: Initial cholangiogram demonstrates the preexisting external drain and a peripheral left intrahepatic duct.  The left lobe biliary tree is decompressed.  There is no initial opacification of central left sided ducts.  With further catheter advancement, there was evidence of a high- grade obstruction at the confluence between right and left intrahepatic ducts at the level of the previously placed metallic biliary stent.  Contrast injection shows no significant antegrade flow of contrast in the common bile duct from the left ducts. Although some guidewires were able to be just advanced into the common bile duct, the catheter would not advance over a wire to allow further access to the level of the duodenum.  Internalized metallic stenting was therefore unable to be performed today.  In order to allow bilirubin to decrease further to allow additional chemotherapy, the external drainage catheter was upsized to 10- [place] and positioned in the central left biliary tree.  This will be left to gravity drainage.  IMPRESSION: Inability to cross a high-grade central obstruction of the left hepatic ducts due to a known obstructing metastatic lesion. External biliary drainage was upsized to 10-French with the catheter further advanced into the central aspect of the left lobe. This will be left to gravity drainage for the time being.  It probably is not worth reattempting to cross the malignant obstruction until the patient can receive further therapy to try to shrink the metastatic lesion.  The indwelling biliary drain should be routinely changed out in roughly 4 to 6 weeks.   Original Report Authenticated By: Irish Lack, M.D.   Ir Perc Biliary Drain-ext Only  03/13/2013   *RADIOLOGY REPORT*  IR PERCUTANEOUS BILIARY DRAIN EXTERNAL ONLY; IR ULTRASOUND GUIDANCE; IR PERCUTANEOUS TRANSHEPATIC CHOLANGIOGRAM  Date: 03/13/2013  Clinical History: 56-year female with metastatic colorectal cancer and  malignant obstructed jaundice. She has a permanent no biliary stent in her right biliary tree.  However the left biliary tree remains completely obstructed by a central mass lesion. She requires percutaneous transhepatic cholangiogram and biliary drain placement for decompression.  Procedures Performed: 1. Percutaneous transhepatic cholangiogram 2.  Placement of an 8-French externalized biliary drain  Interventional Radiologist:  Sterling Big, MD  Sedation: Moderate (conscious) sedation was used.  Three mg Versed, 100 mcg Fentanyl were administered intravenously.  The patient's vital signs were monitored continuously by radiology nursing throughout the procedure.  Sedation Time: 30 minutes  Fluoroscopy time: 8 minutes 24 seconds  Contrast volume: 25 ml Omnipaque-300 administered into the biliary tree  PROCEDURE/FINDINGS:   Informed consent was obtained from the patient following explanation of the procedure, risks, benefits and alternatives. The patient understands, agrees and consents for the procedure. All questions were addressed. A time out was performed.  Maximal barrier sterile technique utilized including caps, mask, sterile gowns, sterile gloves, large sterile drape, hand hygiene,  and  betadine skin prep.  The left upper quadrant was interrogated with ultrasound.  Dilated biliary ducts were successfully identified.  Anesthesia was attained by infiltration of 1% lidocaine.  Under direct sonographic guidance, a 21 gauge Accustick needle was carefully advanced into the liver used to puncture a left biliary radicle.  A percutaneous transhepatic cholangiogram was then performed. There is severe intrahepatic biliary ductal dilatation.  The left main biliary ducts appears completely occluded.  There is no communication of the biliary tree with the previously placed metal stent.  The initial access was slightly central.  Therefore, using a combination of ultrasound and fluoroscopic guidance, a second  percutaneous biliary access was obtained again using a 21-gauge micropuncture.  The tertiary biliary radicle in the inferior left hepatic lobe (segment three) was successfully punctured.  The Accustick sheath was advanced over a wire. Attempts were made to identify the main left hepatic duct using a 4-French angled glide catheter and glide wire through the Accustick sheath.  This was ultimately unsuccessful. And 8-French Cook all-purpose drainage catheter was therefore advanced over a wire and positioned within the central left biliary ducts.  The catheter was secured to the skin with O Prolene suture.  Clear minimally viscous bile was readily aspirated through the tube.  The tube was connected to gravity drainage.  There was no immediate complication, the patient tolerated the procedure well.  IMPRESSION:  1.  Percutaneous transhepatic cholangiogram demonstrates marked dilatation of the left biliary system with complete occlusion of the left main biliary duct.  The segment II, III, 4A and 4B ducts are all dilated.  2. Placement of a 8-French percutaneous biliary drain positioned within the central left hepatic ducts.  The drain was left external gravity drainage.  3.  Plan:  Recommend continued external drainage and decompression of the left biliary system.  The patient can return to interventional radiology in 1 - 2 weeks for another attempt at internalization of the percutaneous biliary drain.  Signed,  Sterling Big, MD Vascular & Interventional Radiologist [facility]   Original Report Authenticated By: Malachy Moan, M.D.   Ir Catheter Tube Change  03/21/2013   *RADIOLOGY REPORT*  Clinical Data: Metastatic colon carcinoma to the liver causing biliary obstruction.  The patient is status post prior endoscopic placement of a metal stent extending up into the right sided bile ducts on 02/27/2013 and percutaneous access of the left lobe of the liver with placement of an external biliary drain  on 03/13/2013.  1.  CHOLANGIOGRAM THROUGH PREEXISTING BILIARY DRAINAGE CATHETER. 2.  EXCHANGE OF PERCUTANEOUS BILIARY DRAINAGE CATHETER.  Comparison:  Most recent procedure on 03/13/2013 as well as multiple other prior imaging.  Sedation: 4.0 mg IV Versed; 200 mcg IV Fentanyl.  Total Moderate Sedation Time: 50 minutes.  Contrast:  30 ml Omnipaque-300  Additional Medications: 400 mg IV Cipro.  Ciprofloxacin was given within two hours of incision.  Fluoroscopy Time: 13 minutes and 18 seconds.  Procedure:  The procedure, risks, benefits, and alternatives were explained to the patient.  Questions regarding the procedure were encouraged and answered.  The patient understands and consents to the procedure.  The preexisting biliary drainage catheter and surrounding skin was prepped with Betadine in a sterile fashion, and a sterile drape was applied covering the operative field.  A sterile gown and sterile gloves were used for the procedure. Local anesthesia was provided with 1% Lidocaine.  The preexisting 8-French biliary drainage catheter was initially injected with contrast material and imaging performed.  The catheter was then removed over a guidewire.  A 6-French sheath was advanced into the biliary system.  A 5-French catheter was then advanced over a guidewire into the left intrahepatic ducts. Additional cholangiography was performed through the 5-French catheter with multiple fluoroscopic spot images obtained.  The catheter was further advanced over different hydrophilic guide wires into the central biliary tree.  Catheter and guide wire advancement was attempted with multiple different types of catheters and wires.  The catheter and sheath were removed over a guidewire.  A new 10- [place] external drainage catheter was advanced over a guidewire with positioning confirmed by fluoroscopy after contrast injection. The catheter was flushed and connected to a gravity drainage bag. It was secured at the skin with a  Prolene retention suture.  Complications: None  Findings: Initial cholangiogram demonstrates the preexisting external drain and a peripheral left intrahepatic duct.  The left lobe biliary tree is decompressed.  There is no initial opacification of central left sided ducts.  With further catheter advancement, there was evidence of a high- grade obstruction at the confluence between right and left intrahepatic ducts at the level of the previously placed metallic biliary stent.  Contrast injection shows no significant antegrade flow of contrast in the common bile duct from the left ducts. Although some guidewires were able to be just advanced into the common bile duct, the catheter would not advance over a wire to allow further access to the level of the duodenum.  Internalized metallic stenting was therefore unable to be performed today.  In order to allow bilirubin to decrease further to allow additional chemotherapy, the external drainage catheter was upsized to 10- [place] and positioned in the central left biliary tree.  This will be left to gravity drainage.  IMPRESSION: Inability to cross a high-grade central obstruction of the left hepatic ducts due to a known obstructing metastatic lesion. External biliary drainage was upsized to 10-French with the catheter further advanced into the central aspect of the left lobe. This will be left to gravity drainage for the time being.  It probably is not worth reattempting to cross the malignant obstruction until the patient can receive further therapy to try to shrink the metastatic lesion.  The indwelling biliary drain should be routinely changed out in roughly 4 to 6 weeks.   Original Report Authenticated By: Irish Lack, M.D.   Ir Ptc  03/13/2013   *RADIOLOGY REPORT*  IR PERCUTANEOUS BILIARY DRAIN EXTERNAL ONLY; IR ULTRASOUND GUIDANCE; IR PERCUTANEOUS TRANSHEPATIC CHOLANGIOGRAM  Date: 03/13/2013  Clinical History: 56-year female with metastatic colorectal cancer  and malignant obstructed jaundice. She has a permanent no biliary stent in her right biliary tree.  However the left biliary tree remains completely obstructed by a central mass lesion. She requires percutaneous transhepatic cholangiogram and biliary drain placement for decompression.  Procedures Performed: 1. Percutaneous transhepatic cholangiogram 2.  Placement of an 8-French externalized biliary drain  Interventional Radiologist:  Sterling Big, MD  Sedation: Moderate (conscious) sedation was used.  Three mg Versed, 100 mcg Fentanyl were administered intravenously.  The patient's vital signs were monitored continuously by radiology nursing throughout the procedure.  Sedation Time: 30 minutes  Fluoroscopy time: 8 minutes 24 seconds  Contrast volume: 25 ml Omnipaque-300 administered into the biliary tree  PROCEDURE/FINDINGS:   Informed consent was obtained from the patient following explanation of the procedure, risks, benefits and alternatives. The patient understands, agrees and consents for the procedure. All questions were addressed. A time out was performed.  Maximal barrier sterile technique utilized including caps, mask, sterile gowns, sterile gloves, large sterile drape, hand hygiene, and  betadine skin prep.  The left upper quadrant was interrogated with ultrasound.  Dilated biliary ducts were successfully identified.  Anesthesia was attained by infiltration of 1% lidocaine.  Under direct sonographic guidance, a 21 gauge Accustick needle was carefully advanced into the liver used to puncture a left biliary radicle.  A percutaneous transhepatic cholangiogram was then performed. There is severe intrahepatic biliary ductal dilatation.  The left main biliary ducts appears completely occluded.  There is no communication of the biliary tree with the previously placed metal stent.  The initial access was slightly central.  Therefore, using a combination of ultrasound and fluoroscopic guidance, a second  percutaneous biliary access was obtained again using a 21-gauge micropuncture.  The tertiary biliary radicle in the inferior left hepatic lobe (segment three) was successfully punctured.  The Accustick sheath was advanced over a wire. Attempts were made to identify the main left hepatic duct using a 4-French angled glide catheter and glide wire through the Accustick sheath.  This was ultimately unsuccessful. And 8-French Cook all-purpose drainage catheter was therefore advanced over a wire and positioned within the central left biliary ducts.  The catheter was secured to the skin with O Prolene suture.  Clear minimally viscous bile was readily aspirated through the tube.  The tube was connected to gravity drainage.  There was no immediate complication, the patient tolerated the procedure well.  IMPRESSION:  1.  Percutaneous transhepatic cholangiogram demonstrates marked dilatation of the left biliary system with complete occlusion of the left main biliary duct.  The segment II, III, 4A and 4B ducts are all dilated.  2. Placement of a 8-French percutaneous biliary drain positioned within the central left hepatic ducts.  The drain was left external gravity drainage.  3.  Plan:  Recommend continued external drainage and decompression of the left biliary system.  The patient can return to interventional radiology in 1 - 2 weeks for another attempt at internalization of the percutaneous biliary drain.  Signed,  Sterling Big, MD Vascular & Interventional Radiologist [facility]   Original Report Authenticated By: Malachy Moan, M.D.   Ir US Guide Bx Asp/drain  03/13/2013   *RADIOLOGY REPORT*  IR PERCUTANEOUS BILIARY DRAIN EXTERNAL ONLY; IR ULTRASOUND GUIDANCE; IR PERCUTANEOUS TRANSHEPATIC CHOLANGIOGRAM  Date: 03/13/2013  Clinical History: 56-year female with metastatic colorectal cancer and malignant obstructed jaundice. She has a permanent no biliary stent in her right biliary tree.  However the left  biliary tree remains completely obstructed by a central mass lesion. She requires percutaneous transhepatic cholangiogram and biliary drain placement for decompression.  Procedures Performed: 1. Percutaneous transhepatic cholangiogram 2.  Placement of an 8-French externalized biliary drain  Interventional Radiologist:  Sterling Big, MD  Sedation: Moderate (conscious) sedation was used.  Three mg Versed, 100 mcg Fentanyl were administered intravenously.  The patient's vital signs were monitored continuously by radiology nursing throughout the procedure.  Sedation Time: 30 minutes  Fluoroscopy time: 8 minutes 24 seconds  Contrast volume: 25 ml Omnipaque-300 administered into the biliary tree  PROCEDURE/FINDINGS:   Informed consent was obtained from the patient following explanation of the procedure, risks, benefits and alternatives. The patient understands, agrees and consents for the procedure. All questions were addressed. A time out was performed.  Maximal barrier sterile technique utilized including caps, mask, sterile gowns, sterile gloves, large sterile drape, hand hygiene, and  betadine skin prep.  The left  upper quadrant was interrogated with ultrasound.  Dilated biliary ducts were successfully identified.  Anesthesia was attained by infiltration of 1% lidocaine.  Under direct sonographic guidance, a 21 gauge Accustick needle was carefully advanced into the liver used to puncture a left biliary radicle.  A percutaneous transhepatic cholangiogram was then performed. There is severe intrahepatic biliary ductal dilatation.  The left main biliary ducts appears completely occluded.  There is no communication of the biliary tree with the previously placed metal stent.  The initial access was slightly central.  Therefore, using a combination of ultrasound and fluoroscopic guidance, a second percutaneous biliary access was obtained again using a 21-gauge micropuncture.  The tertiary biliary radicle in the  inferior left hepatic lobe (segment three) was successfully punctured.  The Accustick sheath was advanced over a wire. Attempts were made to identify the main left hepatic duct using a 4-French angled glide catheter and glide wire through the Accustick sheath.  This was ultimately unsuccessful. And 8-French Cook all-purpose drainage catheter was therefore advanced over a wire and positioned within the central left biliary ducts.  The catheter was secured to the skin with O Prolene suture.  Clear minimally viscous bile was readily aspirated through the tube.  The tube was connected to gravity drainage.  There was no immediate complication, the patient tolerated the procedure well.  IMPRESSION:  1.  Percutaneous transhepatic cholangiogram demonstrates marked dilatation of the left biliary system with complete occlusion of the left main biliary duct.  The segment II, III, 4A and 4B ducts are all dilated.  2. Placement of a 8-French percutaneous biliary drain positioned within the central left hepatic ducts.  The drain was left external gravity drainage.  3.  Plan:  Recommend continued external drainage and decompression of the left biliary system.  The patient can return to interventional radiology in 1 - 2 weeks for another attempt at internalization of the percutaneous biliary drain.  Signed,  Sterling Big, MD Vascular & Interventional Radiologist [facility]   Original Report Authenticated By: Malachy Moan, M.D.    Medications: I have reviewed the patient's current medications.  Assessment/Plan:  1. 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 She completed 12 cycles of FOLFOX chemotherapy. Oxaliplatin was held with cycles number 5, 8, 11, and 12. 2. Metastatic colon cancer confirmed on a restaging CT evaluation 05/02/2011 with liver metastases and periportal  lymphadenopathy. She began treatment with FOLFIRI on 06/01/2011. Avastin was added beginning with cycle number 2. She completed cycle #6 08/08/2011. Restaging CT evaluation 08/15/2011 showed improvement in the liver metastases and porta hepatis lymphadenopathy. She completed cycle #12 FOLFIRI/Avastin 11/07/2011. Restaging CT evaluation 11/24/2011 showed decrease in size of hepatic metastases and no evidence of disease progression or new metastasis. She completed cycle 18 on 03/11/2012. A restaging CT 03/29/2012 revealed stable disease. Irinotecan was discontinued and treatment continued with 5-FU/leucovorin and Avastin on a 3 week schedule beginning 04/02/2012. -restaging CT 08/15/2012 with slight enlargement of the central liver lesion and progression of intrahepatic biliary dilatation, no other liver lesions and no other evidence of progressive metastatic disease. CEA stable on 08/20/2012. Treatment continued with 5-FU and Avastin on a 3 week schedule.restaging CT 09/30/2012 found the dominant central right liver metastasis to be slightly larger with progressive intrahepatic biliary dilatation, a previously described left hepatic lobe lesion appeared smaller measuring 10 mm and appeared separate from  the liver and is felt to most likely represent a porta hepatis lymph node.A. Suspected 9 mm metastasis was noted in the medial left hepatic lobe on an MRI 10/10/2012 -status post stereotactic radiosurgeryto the dominant liver metastasis in 3 fractions February 10 through 10/28/2012. 5-FU/Avastin was resumed on a 3 week schedule 12/03/2012. CEA in normal range on 12/03/2012 and 01/14/2013. Restaging MRI of the liver 02/21/2013 with a decrease in the dominant right hepatic lesion, stable medial left hepatic lobe lesion-but the left lesion is now causing obstruction of the left hepatic bile ducts. 3. Status post Port-A-Cath placement and Port-A-Cath removal by Dr. Michaell Cowing. A new Port-A-Cath was placed on 05/16/2011. The  Port-A-Cath was removed due to malposition. A new Port-A-Cath was placed on 05/30/2011. 4. Spina bifida with severe scoliosis.  5. Chronic bilateral foot drop/lower leg and foot numbness. 6. Hypothyroidism. 7. History of chemotherapy-induced diarrhea. The bolus and infusional 5-FU were reduced by 25% beginning with cycle number 2 of FOLFOX. The 5-FU dose was decreased by an additional 10% beginning with cycle number 8. 8. History of delayed nausea following chemotherapy. 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 The oxaliplatin was dose reduced by 20% beginning with cycle number 6. The neuropathy symptoms resolved. 10. History of neutropenia secondary to chemotherapy. 11. Status post a colonoscopy 05/05/2010. There was a previous right colectomy anastomosis and slight nodularity. Biopsies were taken which showed benign colonic mucosa with hyperplastic epithelial changes. No significant inflammation, granuloma, or adenomatous epithelium was identified. 12. Diarrhea following FOLFIRI chemotherapy. Overall the diarrhea is controlled with Lomotil.  13. History of a fungal rash in the groin, improved with Diflucan and nystatin powder. 14. Mouth ulcers, oral candidiasis following cycle #6 of FOLFIRI-the ulcers resolved following treatment with Diflucan. She continues to note mild mouth ulcers following each treatment. 15. Nausea during chemotherapy-Zofran was added to the chemotherapy regimen beginning on 07/09/2012. 16. Oozing at the gumline secondary to periodontal disease and Avastin 17. Jaundice secondary to biliary obstruction status post placement of a metallic biliary stent on 02/27/2013 without improvement in LFTs. Status post placement of a percutaneous biliary drain by interventional radiology on 03/13/2013. She underwent a cholangiogram on 03/21/2013 with evidence of a high-grade obstruction at the level of the previously placed metallic biliary stent. The catheter could not  be advanced. The external drainage catheter was upsized. 18. Anxiety. She will try Xanax 0.25 mg every 12 hours as needed. She will also resume Prozac 20 mg every other day.  Disposition-Dr. Mitzi Hansen has recommended a 2-3 week course of palliative radiation therapy in an effort to internalize the biliary stent. Dr. Truett Perna recommends concurrent infusional 5-fluorouracil during the course of radiation. We reviewed potential toxicities associated with 5-fluorouracil including mouth sores, nausea, diarrhea, hand-foot syndrome, skin hyperpigmentation, conjunctivitis like symptoms. She is agreeable to proceed.   We anticipate she will begin the course of radiation on 04/07/2013. We will schedule initiation of the 5-fluorouracil pump on 04/07/2013.   We will see her in followup on 04/25/2013. She will contact the office the interim with any problems. We specifically discussed excessive sedation with Xanax and resumption of Prozac.  Patient seen with Dr. Truett Perna.  Lonna Cobb ANP/GNP-BC   I discussed the current status and treatment plan with April Spence and her husband. Her case was presented at the GI tumor conference on 03/26/2013. She has developed biliary obstruction secondary to a metastasis in the left liver. An external biliary drain is in place. The drain cannot be internalized secondary to  the degree of obstruction.  She saw Dr. Mitzi Hansen and is scheduled for palliative radiation to the liver lesion. I recommend concurrent 5-fluorouracil. The bilirubin is elevated. The plan is to give low dose infusional 5-fluorouracil throughout the planned three-week course of radiation. She will return for a 5-fluorouracil pump on 04/07/2013.We will see her for an office visit during week 3.  Mancel Bale, M.D.

## 2013-04-01 NOTE — Telephone Encounter (Signed)
gv and printed appt sched and avs for pt...emailed MW to add tx... °

## 2013-04-01 NOTE — Telephone Encounter (Signed)
Per staff message and POF I have scheduled appts.  JMW  

## 2013-04-01 NOTE — Telephone Encounter (Signed)
Per staff message and POF I have scheduled appts. Sent scheduler message to move labs  JMW

## 2013-04-02 ENCOUNTER — Telehealth: Payer: Self-pay | Admitting: *Deleted

## 2013-04-02 ENCOUNTER — Telehealth: Payer: Self-pay | Admitting: Gastroenterology

## 2013-04-02 ENCOUNTER — Ambulatory Visit (HOSPITAL_COMMUNITY)
Admission: RE | Admit: 2013-04-02 | Discharge: 2013-04-02 | Disposition: A | Discharge status: Home / Self Care | Payer: Medicare HMO | Source: Ambulatory Visit | Attending: Radiation Oncology | Admitting: Radiation Oncology

## 2013-04-02 ENCOUNTER — Ambulatory Visit: Admission: RE | Admit: 2013-04-02 | Payer: Medicare HMO | Source: Ambulatory Visit

## 2013-04-02 ENCOUNTER — Ambulatory Visit: Payer: Medicare HMO | Admitting: Radiation Oncology

## 2013-04-02 ENCOUNTER — Other Ambulatory Visit: Payer: Self-pay | Admitting: Oncology

## 2013-04-02 ENCOUNTER — Other Ambulatory Visit: Payer: Self-pay | Admitting: Radiation Oncology

## 2013-04-02 DIAGNOSIS — K831 Obstruction of bile duct: Secondary | ICD-10-CM | POA: Insufficient documentation

## 2013-04-02 DIAGNOSIS — Z438 Encounter for attention to other artificial openings: Secondary | ICD-10-CM | POA: Insufficient documentation

## 2013-04-02 DIAGNOSIS — M549 Dorsalgia, unspecified: Secondary | ICD-10-CM | POA: Insufficient documentation

## 2013-04-02 MED ORDER — IOHEXOL 300 MG/ML  SOLN
50.0000 mL | Freq: Once | INTRAMUSCULAR | Status: AC | PRN
  Administered 2013-04-02: 10 mL

## 2013-04-02 NOTE — Telephone Encounter (Addendum)
Received call from Dr Roselind Messier stating pt had called [facility] answering service stating she was having issues w/"liver bag not draining". Called pt and spoke w/her. She states "last night she noticed her liver bag had not drained very much. This morning it still was not draining. She flushed it twice and still no drainage." Advised pt this RN will call Dr Larae Grooms office for instructions. Pt also scheduled for CT sim in this dept today; will notify Dr Mitzi Hansen of pt's status and call her back re: her ct sim appointment today.  Spoke w/Dr [employer] office; Patty RN advised to call IR dept at [facility] for this issue. Spoke w/Tina at [employer], and she will call pt to schedule her to come in today for this issue.  Received callback from [place], [place] IR dept. She states pt is going to [facility] today to have this resolved. Inetta Fermo states pt needs order entered for this procedure. Dr Roselind Messier notified. 8:40 am CT sim notified of pt's status today, will update re: ct sim today after speaking w/Dr Mitzi Hansen who will be in this office at 9 am today. 9:00 am Per Dr Mitzi Hansen, pt to be rescheduled for ct sim on 04/03/13 or 04/04/13. Arlys John RT in ct sim dept notified, Evangeline Gula notified. Arlys John stated he will call pt today w/new appointment.

## 2013-04-02 NOTE — Telephone Encounter (Signed)
April Spence was notified to call [facility] radiology and speak with the radiologist about the procedure.  I am unable to answer her question.  April Spence agreed and number was given to her

## 2013-04-03 ENCOUNTER — Ambulatory Visit
Admission: RE | Admit: 2013-04-03 | Discharge: 2013-04-03 | Disposition: A | Discharge status: Home / Self Care | Payer: Medicare HMO | Source: Ambulatory Visit | Attending: Radiation Oncology | Admitting: Radiation Oncology

## 2013-04-03 ENCOUNTER — Other Ambulatory Visit: Payer: Self-pay | Admitting: Radiation Oncology

## 2013-04-03 VITALS — BP 122/78 | HR 89 | Temp 98.3°F

## 2013-04-03 DIAGNOSIS — R110 Nausea: Secondary | ICD-10-CM | POA: Insufficient documentation

## 2013-04-03 DIAGNOSIS — Z510 Encounter for antineoplastic radiation therapy: Secondary | ICD-10-CM | POA: Insufficient documentation

## 2013-04-03 DIAGNOSIS — C189 Malignant neoplasm of colon, unspecified: Secondary | ICD-10-CM | POA: Insufficient documentation

## 2013-04-03 DIAGNOSIS — R5381 Other malaise: Secondary | ICD-10-CM | POA: Insufficient documentation

## 2013-04-03 DIAGNOSIS — Z79899 Other long term (current) drug therapy: Secondary | ICD-10-CM | POA: Insufficient documentation

## 2013-04-03 DIAGNOSIS — C787 Secondary malignant neoplasm of liver and intrahepatic bile duct: Secondary | ICD-10-CM | POA: Insufficient documentation

## 2013-04-03 MED ORDER — SODIUM CHLORIDE 0.9 % IJ SOLN
10.0000 mL | Freq: Once | INTRAMUSCULAR | Status: AC
  Administered 2013-04-03: 10 mL via INTRAVENOUS

## 2013-04-03 MED ORDER — HEPARIN SOD (PORK) LOCK FLUSH 100 UNIT/ML IV SOLN
500.0000 [IU] | Freq: Once | INTRAVENOUS | Status: AC
  Administered 2013-04-03: 500 [IU] via INTRAVENOUS

## 2013-04-03 NOTE — Progress Notes (Signed)
Pat brought to nursing dressing room 8,  To de-accsess port a cath left, flushed 10cc ns and 5c heparin flush per protocol, bandaiad applied over site, pt tolerated well 2:47 PM

## 2013-04-03 NOTE — Progress Notes (Signed)
April Spence here for IV start.  Port a cath accessed in left chest.  Blood return noted. Site secured with tegaderm.

## 2013-04-07 ENCOUNTER — Ambulatory Visit
Admission: RE | Admit: 2013-04-07 | Discharge: 2013-04-07 | Disposition: A | Discharge status: Home / Self Care | Payer: Medicare HMO | Source: Ambulatory Visit | Attending: Radiation Oncology | Admitting: Radiation Oncology

## 2013-04-07 ENCOUNTER — Other Ambulatory Visit (HOSPITAL_BASED_OUTPATIENT_CLINIC_OR_DEPARTMENT_OTHER): Payer: Medicare HMO | Admitting: Lab

## 2013-04-07 ENCOUNTER — Ambulatory Visit (HOSPITAL_BASED_OUTPATIENT_CLINIC_OR_DEPARTMENT_OTHER): Payer: Medicare HMO

## 2013-04-07 VITALS — BP 101/74 | HR 70 | Temp 97.8°F | Resp 18

## 2013-04-07 DIAGNOSIS — C189 Malignant neoplasm of colon, unspecified: Secondary | ICD-10-CM

## 2013-04-07 DIAGNOSIS — C787 Secondary malignant neoplasm of liver and intrahepatic bile duct: Secondary | ICD-10-CM

## 2013-04-07 DIAGNOSIS — C180 Malignant neoplasm of cecum: Secondary | ICD-10-CM

## 2013-04-07 DIAGNOSIS — C801 Malignant (primary) neoplasm, unspecified: Secondary | ICD-10-CM

## 2013-04-07 DIAGNOSIS — Z5111 Encounter for antineoplastic chemotherapy: Secondary | ICD-10-CM

## 2013-04-07 LAB — COMPREHENSIVE METABOLIC PANEL (CC13)
ALT: 27 U/L (ref 0–55)
AST: 52 U/L — ABNORMAL HIGH (ref 5–34)
Albumin: 2.7 g/dL — ABNORMAL LOW (ref 3.5–5.0)
Alkaline Phosphatase: 212 U/L — ABNORMAL HIGH (ref 40–150)
Glucose: 123 mg/dl (ref 70–140)
Potassium: 4.5 mEq/L (ref 3.5–5.1)
Sodium: 134 mEq/L — ABNORMAL LOW (ref 136–145)
Total Protein: 8.2 g/dL (ref 6.4–8.3)

## 2013-04-07 LAB — CBC WITH DIFFERENTIAL/PLATELET
BASO%: 0.2 % (ref 0.0–2.0)
Basophils Absolute: 0 10*3/uL (ref 0.0–0.1)
EOS%: 2.5 % (ref 0.0–7.0)
HCT: 42.3 % (ref 34.8–46.6)
HGB: 14.1 g/dL (ref 11.6–15.9)
LYMPH%: 14 % (ref 14.0–49.7)
MCH: 32.3 pg (ref 25.1–34.0)
MONO#: 0.8 10*3/uL (ref 0.1–0.9)
NEUT%: 73.9 % (ref 38.4–76.8)
Platelets: 183 10*3/uL (ref 145–400)
WBC: 8.9 10*3/uL (ref 3.9–10.3)
lymph#: 1.2 10*3/uL (ref 0.9–3.3)

## 2013-04-07 MED ORDER — SODIUM CHLORIDE 0.9 % IV SOLN
175.0000 mg/m2/d | INTRAVENOUS | Status: DC
  Administered 2013-04-07: 1950 mg via INTRAVENOUS
  Filled 2013-04-07: qty 39

## 2013-04-07 NOTE — Patient Instructions (Addendum)
[facility] Discharge Instructions for Patients Receiving Chemotherapy  Today you received the following chemotherapy agents: Adrucil.  To help prevent nausea and vomiting after your treatment, we encourage you to take your nausea medication as prescribed.   If you develop nausea and vomiting that is not controlled by your nausea medication, call the clinic.   BELOW ARE SYMPTOMS THAT SHOULD BE REPORTED IMMEDIATELY:  *FEVER GREATER THAN 100.5 F  *CHILLS WITH OR WITHOUT FEVER  NAUSEA AND VOMITING THAT IS NOT CONTROLLED WITH YOUR NAUSEA MEDICATION  *UNUSUAL SHORTNESS OF BREATH  *UNUSUAL BRUISING OR BLEEDING  TENDERNESS IN MOUTH AND THROAT WITH OR WITHOUT PRESENCE OF ULCERS  *URINARY PROBLEMS  *BOWEL PROBLEMS  UNUSUAL RASH Items with * indicate a potential emergency and should be followed up as soon as possible.  Feel free to call the clinic you have any questions or concerns. The clinic phone number is [phone].    

## 2013-04-08 ENCOUNTER — Other Ambulatory Visit (HOSPITAL_COMMUNITY): Payer: Self-pay | Admitting: Interventional Radiology

## 2013-04-08 ENCOUNTER — Ambulatory Visit (HOSPITAL_COMMUNITY)
Admission: RE | Admit: 2013-04-08 | Discharge: 2013-04-08 | Disposition: A | Discharge status: Home / Self Care | Payer: Medicare HMO | Source: Ambulatory Visit | Attending: Interventional Radiology | Admitting: Interventional Radiology

## 2013-04-08 ENCOUNTER — Ambulatory Visit
Admission: RE | Admit: 2013-04-08 | Discharge: 2013-04-08 | Disposition: A | Discharge status: Home / Self Care | Payer: Medicare HMO | Source: Ambulatory Visit | Attending: Radiation Oncology | Admitting: Radiation Oncology

## 2013-04-08 DIAGNOSIS — K831 Obstruction of bile duct: Secondary | ICD-10-CM | POA: Insufficient documentation

## 2013-04-08 NOTE — Progress Notes (Addendum)
Sentoria Slabach's husband, Aurther Loft, came to the clinic and said that April Spence's biliary drain has only been putting out 10-15 ml of fluid today.  He has flushed it 3 times and it has returned what he has put it.  Called interventional radiology and they would me able to check the drain.  Notified Terry and Linac 1.

## 2013-04-09 ENCOUNTER — Ambulatory Visit
Admission: RE | Admit: 2013-04-09 | Discharge: 2013-04-09 | Disposition: A | Discharge status: Home / Self Care | Payer: Medicare HMO | Source: Ambulatory Visit | Attending: Radiation Oncology | Admitting: Radiation Oncology

## 2013-04-10 ENCOUNTER — Ambulatory Visit: Payer: Medicare HMO | Admitting: Radiation Oncology

## 2013-04-10 ENCOUNTER — Ambulatory Visit
Admission: RE | Admit: 2013-04-10 | Discharge: 2013-04-10 | Disposition: A | Discharge status: Home / Self Care | Payer: Medicare HMO | Source: Ambulatory Visit | Attending: Radiation Oncology | Admitting: Radiation Oncology

## 2013-04-11 ENCOUNTER — Ambulatory Visit
Admission: RE | Admit: 2013-04-11 | Discharge: 2013-04-11 | Disposition: A | Discharge status: Home / Self Care | Payer: Medicare HMO | Source: Ambulatory Visit | Attending: Radiation Oncology | Admitting: Radiation Oncology

## 2013-04-11 VITALS — BP 112/72 | HR 77 | Temp 97.7°F | Ht 60.0 in

## 2013-04-11 DIAGNOSIS — C787 Secondary malignant neoplasm of liver and intrahepatic bile duct: Secondary | ICD-10-CM

## 2013-04-11 NOTE — Progress Notes (Signed)
Department of Radiation Oncology  Phone:  [phone] Fax:        [phone]  Weekly Treatment Note    Name: April Spence Date: 04/11/2013 MRN: [id number] DOB: 1956/03/16   Current dose: 12.5 Gy  Current fraction: 5   MEDICATIONS: Current Outpatient Prescriptions  Medication Sig Dispense Refill  . ALPRAZolam (XANAX) 0.25 MG tablet Take 1 tablet (0.25 mg total) by mouth every 12 (twelve) hours as needed for sleep or anxiety.  40 tablet  0  . Alum & Mag Hydroxide-Simeth (MAGIC MOUTHWASH) SOLN Take 10 mLs by mouth 4 (four) times daily as needed. Swish and spit  240 mL  2  . amitriptyline (ELAVIL) 25 MG tablet Take 25 mg by mouth at bedtime.       . chlorhexidine (PERIDEX) 0.12 % solution Use as directed 15 mLs in the mouth or throat 2 (two) times daily.       . Cholecalciferol (VITAMIN D-3) 5000 UNITS TABS Take 5,000 mg by mouth 2 (two) times daily.      . diphenoxylate-atropine (LOMOTIL) 2.5-0.025 MG per tablet Take 1 tablet by mouth 4 (four) times daily as needed for diarrhea or loose stools.      Marland Kitchen FLUoxetine (PROZAC) 20 MG capsule Take 20 mg by mouth every morning.       Marland Kitchen levothyroxine (SYNTHROID, LEVOTHROID) 150 MCG tablet Take 150 mcg by mouth daily before breakfast.      . lidocaine-prilocaine (EMLA) cream Apply topically as needed. APPLY TO PORTACATH SITE 1-2 HOURS PRIOR TO USE  30 g  5  . LORazepam (ATIVAN) 0.5 MG tablet Take 1 tablet (0.5 mg total) by mouth every 12 (twelve) hours as needed for anxiety.  30 tablet  2  . nystatin (NYSTOP) 100000 UNIT/GM POWD Apply 1 g topically daily as needed (for rash). In summer on irritated areas      . ondansetron (ZOFRAN) 8 MG tablet Take 1 tablet (8 mg total) by mouth every 12 (twelve) hours as needed for nausea.  20 tablet  2  . oxyCODONE-acetaminophen (PERCOCET/ROXICET) 5-325 MG per tablet Take 1 tablet by mouth every 4 (four) hours as needed for pain.  50 tablet  0  . PRESCRIPTION MEDICATION Pt gets chemotherapy at [facility]. Last  treatment was on 02-11-13. Pt missed last treatment due to endo procedure. Followed by Dr Truett Perna      . prochlorperazine (COMPAZINE) 10 MG tablet Take 10 mg by mouth every 6 (six) hours as needed (nausea).       . propranolol (INDERAL) 80 MG tablet Take 80 mg by mouth every morning.       . traMADol-acetaminophen (ULTRACET) 37.5-325 MG per tablet Take 1 tablet by mouth every 6 (six) hours as needed for pain.       . fenofibrate micronized (LOFIBRA) 134 MG capsule Take 134 mg by mouth every evening.        No current facility-administered medications for this encounter.     ALLERGIES: Adhesive   LABORATORY DATA:  Lab Results  Component Value Date   WBC 8.9 04/07/2013   HGB 14.1 04/07/2013   HCT 42.3 04/07/2013   MCV 97.0 04/07/2013   PLT 183 04/07/2013   Lab Results  Component Value Date   NA 134* 04/07/2013   K 4.5 04/07/2013   CL 97 03/21/2013   CO2 22 04/07/2013   Lab Results  Component Value Date   ALT 27 04/07/2013   AST 52* 04/07/2013   ALKPHOS 212* 04/07/2013  BILITOT 3.20* 04/07/2013     NARRATIVE: April Spence was seen today for weekly treatment management. The chart was checked and the patient's films were reviewed. The patient is doing very well. She states that Compazine works well for nausea. The patient does have some fatigue.  PHYSICAL EXAMINATION: height is 5' (1.524 m). Her temperature is 97.7 F (36.5 C). Her blood pressure is 112/72 and her pulse is 77.        ASSESSMENT: The patient is doing satisfactorily with treatment.  PLAN: We will continue with the patient's radiation treatment as planned.

## 2013-04-11 NOTE — Progress Notes (Addendum)
April Spence here for weekly under treat visit.  She has had 5 fractions to her liver.  She denies pain but has been having pain on her right upper abdomen.  She does have nausea and has been taking compazine which helps.  She reports that her appetite is OK.  She denies diarrhea but does have constipation.  She does have fatigue.   She was given the Radiation Therapy and You book and discussed the possible side effects of radiation including fatigue, diarrhea, nausea, hair loss and skin changes.  She was advised to contact nursing with any questions or concerns.

## 2013-04-14 ENCOUNTER — Ambulatory Visit
Admission: RE | Admit: 2013-04-14 | Discharge: 2013-04-14 | Disposition: A | Discharge status: Home / Self Care | Payer: Medicare HMO | Source: Ambulatory Visit | Attending: Radiation Oncology | Admitting: Radiation Oncology

## 2013-04-14 ENCOUNTER — Ambulatory Visit (HOSPITAL_BASED_OUTPATIENT_CLINIC_OR_DEPARTMENT_OTHER): Payer: Medicare HMO

## 2013-04-14 ENCOUNTER — Other Ambulatory Visit: Payer: Self-pay | Admitting: Oncology

## 2013-04-14 ENCOUNTER — Other Ambulatory Visit: Payer: Medicare HMO | Admitting: Lab

## 2013-04-14 ENCOUNTER — Other Ambulatory Visit (HOSPITAL_BASED_OUTPATIENT_CLINIC_OR_DEPARTMENT_OTHER): Payer: Medicare HMO | Admitting: Lab

## 2013-04-14 ENCOUNTER — Ambulatory Visit: Payer: Medicare HMO

## 2013-04-14 ENCOUNTER — Other Ambulatory Visit (HOSPITAL_COMMUNITY): Payer: Self-pay | Admitting: Diagnostic Radiology

## 2013-04-14 VITALS — BP 113/74 | HR 71 | Temp 98.1°F | Resp 16

## 2013-04-14 DIAGNOSIS — Z5111 Encounter for antineoplastic chemotherapy: Secondary | ICD-10-CM

## 2013-04-14 DIAGNOSIS — C189 Malignant neoplasm of colon, unspecified: Secondary | ICD-10-CM

## 2013-04-14 DIAGNOSIS — C787 Secondary malignant neoplasm of liver and intrahepatic bile duct: Secondary | ICD-10-CM

## 2013-04-14 DIAGNOSIS — C180 Malignant neoplasm of cecum: Secondary | ICD-10-CM

## 2013-04-14 DIAGNOSIS — R52 Pain, unspecified: Secondary | ICD-10-CM

## 2013-04-14 DIAGNOSIS — C801 Malignant (primary) neoplasm, unspecified: Secondary | ICD-10-CM

## 2013-04-14 LAB — CBC WITH DIFFERENTIAL/PLATELET
Basophils Absolute: 0 10*3/uL (ref 0.0–0.1)
EOS%: 2.5 % (ref 0.0–7.0)
Eosinophils Absolute: 0.2 10*3/uL (ref 0.0–0.5)
LYMPH%: 8 % — ABNORMAL LOW (ref 14.0–49.7)
MCH: 32.6 pg (ref 25.1–34.0)
MCV: 96.4 fL (ref 79.5–101.0)
MONO%: 12.1 % (ref 0.0–14.0)
NEUT#: 5.3 10*3/uL (ref 1.5–6.5)
Platelets: 176 10*3/uL (ref 145–400)
RBC: 3.95 10*6/uL (ref 3.70–5.45)

## 2013-04-14 LAB — COMPREHENSIVE METABOLIC PANEL (CC13)
Alkaline Phosphatase: 244 U/L — ABNORMAL HIGH (ref 40–150)
BUN: 7.7 mg/dL (ref 7.0–26.0)
Glucose: 119 mg/dl (ref 70–140)
Sodium: 133 mEq/L — ABNORMAL LOW (ref 136–145)
Total Bilirubin: 5.43 mg/dL — ABNORMAL HIGH (ref 0.20–1.20)

## 2013-04-14 MED ORDER — HEPARIN SOD (PORK) LOCK FLUSH 100 UNIT/ML IV SOLN
500.0000 [IU] | Freq: Once | INTRAVENOUS | Status: AC | PRN
  Administered 2013-04-14: 500 [IU]
  Filled 2013-04-14: qty 5

## 2013-04-14 MED ORDER — SODIUM CHLORIDE 0.9 % IV SOLN
175.0000 mg/m2/d | INTRAVENOUS | Status: DC
  Administered 2013-04-14: 1950 mg via INTRAVENOUS
  Filled 2013-04-14: qty 39

## 2013-04-14 MED ORDER — SODIUM CHLORIDE 0.9 % IJ SOLN
10.0000 mL | INTRAMUSCULAR | Status: DC | PRN
Start: 2013-04-14 — End: 2013-04-14
  Administered 2013-04-14: 10 mL
  Filled 2013-04-14: qty 10

## 2013-04-14 NOTE — Patient Instructions (Addendum)
[facility] Discharge Instructions for Patients Receiving Chemotherapy  Today you received the following chemotherapy agents 5FU PUMP FOR 7 DAYS  To help prevent nausea and vomiting after your treatment, we encourage you to take your nausea medication AS NEEDED.   If you develop nausea and vomiting that is not controlled by your nausea medication, call the clinic.   BELOW ARE SYMPTOMS THAT SHOULD BE REPORTED IMMEDIATELY:  *FEVER GREATER THAN 100.5 F  *CHILLS WITH OR WITHOUT FEVER  NAUSEA AND VOMITING THAT IS NOT CONTROLLED WITH YOUR NAUSEA MEDICATION  *UNUSUAL SHORTNESS OF BREATH  *UNUSUAL BRUISING OR BLEEDING  TENDERNESS IN MOUTH AND THROAT WITH OR WITHOUT PRESENCE OF ULCERS  *URINARY PROBLEMS  *BOWEL PROBLEMS  UNUSUAL RASH Items with * indicate a potential emergency and should be followed up as soon as possible.  Feel free to call the clinic you have any questions or concerns. The clinic phone number is [phone].

## 2013-04-15 ENCOUNTER — Ambulatory Visit
Admission: RE | Admit: 2013-04-15 | Discharge: 2013-04-15 | Disposition: A | Discharge status: Home / Self Care | Payer: Medicare HMO | Source: Ambulatory Visit | Attending: Radiation Oncology | Admitting: Radiation Oncology

## 2013-04-15 ENCOUNTER — Ambulatory Visit: Payer: Medicare HMO

## 2013-04-16 ENCOUNTER — Ambulatory Visit: Payer: Medicare HMO

## 2013-04-16 ENCOUNTER — Ambulatory Visit
Admission: RE | Admit: 2013-04-16 | Discharge: 2013-04-16 | Disposition: A | Discharge status: Home / Self Care | Payer: Medicare HMO | Source: Ambulatory Visit | Attending: Radiation Oncology | Admitting: Radiation Oncology

## 2013-04-16 VITALS — BP 107/70 | HR 80 | Temp 97.8°F | Ht 60.0 in | Wt 130.5 lb

## 2013-04-16 DIAGNOSIS — C787 Secondary malignant neoplasm of liver and intrahepatic bile duct: Secondary | ICD-10-CM

## 2013-04-16 NOTE — Progress Notes (Signed)
Department of Radiation Oncology  Phone:  [phone] Fax:        [phone]  Weekly Treatment Note    Name: ASIYA CUTBIRTH Date: 04/16/2013 MRN: [id number] DOB: 08/28/1956   Current dose: 20 Gy  Current fraction: 8   MEDICATIONS: Current Outpatient Prescriptions  Medication Sig Dispense Refill  . ALPRAZolam (XANAX) 0.25 MG tablet Take 1 tablet (0.25 mg total) by mouth every 12 (twelve) hours as needed for sleep or anxiety.  40 tablet  0  . Alum & Mag Hydroxide-Simeth (MAGIC MOUTHWASH) SOLN Take 10 mLs by mouth 4 (four) times daily as needed. Swish and spit  240 mL  2  . amitriptyline (ELAVIL) 25 MG tablet Take 25 mg by mouth at bedtime.       . chlorhexidine (PERIDEX) 0.12 % solution Use as directed 15 mLs in the mouth or throat 2 (two) times daily.       . Cholecalciferol (VITAMIN D-3) 5000 UNITS TABS Take 5,000 mg by mouth 2 (two) times daily.      . diphenoxylate-atropine (LOMOTIL) 2.5-0.025 MG per tablet Take 1 tablet by mouth 4 (four) times daily as needed for diarrhea or loose stools.      Marland Kitchen FLUoxetine (PROZAC) 20 MG capsule Take 20 mg by mouth every morning.       Marland Kitchen levothyroxine (SYNTHROID, LEVOTHROID) 150 MCG tablet Take 150 mcg by mouth daily before breakfast.      . lidocaine-prilocaine (EMLA) cream Apply topically as needed. APPLY TO PORTACATH SITE 1-2 HOURS PRIOR TO USE  30 g  5  . LORazepam (ATIVAN) 0.5 MG tablet Take 1 tablet (0.5 mg total) by mouth every 12 (twelve) hours as needed for anxiety.  30 tablet  2  . nystatin (NYSTOP) 100000 UNIT/GM POWD Apply 1 g topically daily as needed (for rash). In summer on irritated areas      . ondansetron (ZOFRAN) 8 MG tablet Take 1 tablet (8 mg total) by mouth every 12 (twelve) hours as needed for nausea.  20 tablet  2  . oxyCODONE-acetaminophen (PERCOCET/ROXICET) 5-325 MG per tablet Take 1 tablet by mouth every 4 (four) hours as needed for pain.  50 tablet  0  . PRESCRIPTION MEDICATION Pt gets chemotherapy at [facility]. Last  treatment was on 02-11-13. Pt missed last treatment due to endo procedure. Followed by Dr Truett Perna      . prochlorperazine (COMPAZINE) 10 MG tablet Take 10 mg by mouth every 6 (six) hours as needed (nausea).       . propranolol (INDERAL) 80 MG tablet Take 80 mg by mouth every morning.       . traMADol-acetaminophen (ULTRACET) 37.5-325 MG per tablet Take 1 tablet by mouth every 6 (six) hours as needed for pain.       . fenofibrate micronized (LOFIBRA) 134 MG capsule Take 134 mg by mouth every evening.        No current facility-administered medications for this encounter.     ALLERGIES: Adhesive   LABORATORY DATA:  Lab Results  Component Value Date   WBC 6.8 04/14/2013   HGB 12.9 04/14/2013   HCT 38.1 04/14/2013   MCV 96.4 04/14/2013   PLT 176 04/14/2013   Lab Results  Component Value Date   NA 133* 04/14/2013   K 4.0 04/14/2013   CL 97 03/21/2013   CO2 24 04/14/2013   Lab Results  Component Value Date   ALT 28 04/14/2013   AST 53* 04/14/2013   ALKPHOS 244* 04/14/2013  BILITOT 5.43* 04/14/2013     NARRATIVE: [name] was seen today for weekly treatment management. The chart was checked and the patient's films were reviewed. The patient is doing well with her treatment. She indicates that her appetite is better, no nausea. The biliary drain she states is draining well.  PHYSICAL EXAMINATION: height is 5' (1.524 m) and weight is 130 lb 8 oz (59.194 kg). Her temperature is 97.8 F (36.6 C). Her blood pressure is 107/70 and her pulse is 80.        ASSESSMENT: The patient is doing satisfactorily with treatment.  PLAN: We will continue with the patient's radiation treatment as planned.

## 2013-04-16 NOTE — Progress Notes (Signed)
April Spence here with her husband for weekly under treat visit.  She has had 8 fractions to her liver.  She is rating her pain at a 6/10 today on her right and left sides.  She is taking percocet and this helps.  She states her nausea is better this week and her appetite is OK.  She does have fatigue.  She has 5FU infusing through her left port-a-cath.  She reports that her biliary drain has been working and it has been draining.  She states that she has not noticed any skin changes in the treatment area.

## 2013-04-17 ENCOUNTER — Ambulatory Visit
Admission: RE | Admit: 2013-04-17 | Discharge: 2013-04-17 | Disposition: A | Discharge status: Home / Self Care | Payer: Medicare HMO | Source: Ambulatory Visit | Attending: Radiation Oncology | Admitting: Radiation Oncology

## 2013-04-17 ENCOUNTER — Ambulatory Visit: Payer: Medicare HMO

## 2013-04-18 ENCOUNTER — Ambulatory Visit
Admission: RE | Admit: 2013-04-18 | Discharge: 2013-04-18 | Disposition: A | Discharge status: Home / Self Care | Payer: Medicare HMO | Source: Ambulatory Visit | Attending: Radiation Oncology | Admitting: Radiation Oncology

## 2013-04-18 ENCOUNTER — Other Ambulatory Visit: Payer: Self-pay | Admitting: *Deleted

## 2013-04-18 ENCOUNTER — Ambulatory Visit: Payer: Medicare HMO

## 2013-04-18 DIAGNOSIS — C189 Malignant neoplasm of colon, unspecified: Secondary | ICD-10-CM

## 2013-04-18 MED ORDER — DIPHENOXYLATE-ATROPINE 2.5-0.025 MG PO TABS: 1.0000 | ORAL_TABLET | Freq: Four times a day (QID) | ORAL | Status: DC | PRN

## 2013-04-20 ENCOUNTER — Other Ambulatory Visit: Payer: Self-pay | Admitting: Oncology

## 2013-04-21 ENCOUNTER — Ambulatory Visit (HOSPITAL_BASED_OUTPATIENT_CLINIC_OR_DEPARTMENT_OTHER): Payer: Medicare HMO

## 2013-04-21 ENCOUNTER — Other Ambulatory Visit (HOSPITAL_BASED_OUTPATIENT_CLINIC_OR_DEPARTMENT_OTHER): Payer: Medicare HMO | Admitting: Lab

## 2013-04-21 ENCOUNTER — Telehealth: Payer: Self-pay

## 2013-04-21 ENCOUNTER — Ambulatory Visit
Admission: RE | Admit: 2013-04-21 | Discharge: 2013-04-21 | Disposition: A | Discharge status: Home / Self Care | Payer: Medicare HMO | Source: Ambulatory Visit | Attending: Radiation Oncology | Admitting: Radiation Oncology

## 2013-04-21 ENCOUNTER — Other Ambulatory Visit: Payer: Self-pay

## 2013-04-21 ENCOUNTER — Ambulatory Visit: Payer: Medicare HMO

## 2013-04-21 DIAGNOSIS — Z85038 Personal history of other malignant neoplasm of large intestine: Secondary | ICD-10-CM

## 2013-04-21 DIAGNOSIS — C189 Malignant neoplasm of colon, unspecified: Secondary | ICD-10-CM

## 2013-04-21 DIAGNOSIS — Z452 Encounter for adjustment and management of vascular access device: Secondary | ICD-10-CM

## 2013-04-21 DIAGNOSIS — C180 Malignant neoplasm of cecum: Secondary | ICD-10-CM

## 2013-04-21 DIAGNOSIS — C787 Secondary malignant neoplasm of liver and intrahepatic bile duct: Secondary | ICD-10-CM

## 2013-04-21 LAB — CBC WITH DIFFERENTIAL/PLATELET
BASO%: 0.2 % (ref 0.0–2.0)
Basophils Absolute: 0 10*3/uL (ref 0.0–0.1)
EOS%: 2 % (ref 0.0–7.0)
Eosinophils Absolute: 0.1 10*3/uL (ref 0.0–0.5)
HCT: 37.3 % (ref 34.8–46.6)
HGB: 12.7 g/dL (ref 11.6–15.9)
LYMPH%: 6.1 % — ABNORMAL LOW (ref 14.0–49.7)
MCH: 33 pg (ref 25.1–34.0)
MCHC: 34.1 g/dL (ref 31.5–36.0)
MCV: 96.6 fL (ref 79.5–101.0)
MONO#: 1 10*3/uL — ABNORMAL HIGH (ref 0.1–0.9)
MONO%: 14.6 % — ABNORMAL HIGH (ref 0.0–14.0)
NEUT#: 5.2 10*3/uL (ref 1.5–6.5)
NEUT%: 77.1 % — ABNORMAL HIGH (ref 38.4–76.8)
Platelets: 209 10*3/uL (ref 145–400)
RBC: 3.86 10*6/uL (ref 3.70–5.45)
RDW: 15.6 % — ABNORMAL HIGH (ref 11.2–14.5)
WBC: 6.7 10*3/uL (ref 3.9–10.3)
lymph#: 0.4 10*3/uL — ABNORMAL LOW (ref 0.9–3.3)

## 2013-04-21 MED ORDER — HEPARIN SOD (PORK) LOCK FLUSH 100 UNIT/ML IV SOLN
500.0000 [IU] | Freq: Once | INTRAVENOUS | Status: AC
  Administered 2013-04-21: 500 [IU] via INTRAVENOUS
  Filled 2013-04-21: qty 5

## 2013-04-21 MED ORDER — SODIUM CHLORIDE 0.9 % IJ SOLN
10.0000 mL | INTRAMUSCULAR | Status: DC | PRN
  Administered 2013-04-21: 10 mL via INTRAVENOUS
  Filled 2013-04-21: qty 10

## 2013-04-21 NOTE — Progress Notes (Signed)
Hold treatment today due to mouth sores per Dr. Truett Perna.

## 2013-04-21 NOTE — Telephone Encounter (Signed)
Need to send in prep

## 2013-04-21 NOTE — Telephone Encounter (Signed)
Left message on machine to call back  

## 2013-04-21 NOTE — Telephone Encounter (Signed)
Pt has been notified and will come by our office tomorrow after 2 to sign paperwork. Pt also would like to know if she can be admitted the day prior for prep, she is in a wheelchair and has a hard time prepping.  Dr Christella Hartigan is this ok?

## 2013-04-21 NOTE — Telephone Encounter (Signed)
Hospital colon moderate sedation 05/08/13 130 pm

## 2013-04-22 ENCOUNTER — Ambulatory Visit
Admission: RE | Admit: 2013-04-22 | Discharge: 2013-04-22 | Disposition: A | Discharge status: Home / Self Care | Payer: Medicare HMO | Source: Ambulatory Visit | Attending: Radiation Oncology | Admitting: Radiation Oncology

## 2013-04-22 ENCOUNTER — Ambulatory Visit: Payer: Medicare HMO

## 2013-04-22 ENCOUNTER — Encounter: Payer: Self-pay | Admitting: Gastroenterology

## 2013-04-22 NOTE — Telephone Encounter (Signed)
I called the pt and asked her not to come in today to sign paperwork.  She may be admitted and will be prepped at the hospital.  I will call pt after Dr Christella Hartigan returns to set up pre admit if Dr Christella Hartigan agrees.

## 2013-04-23 ENCOUNTER — Ambulatory Visit: Payer: Medicare HMO

## 2013-04-23 ENCOUNTER — Ambulatory Visit
Admission: RE | Admit: 2013-04-23 | Discharge: 2013-04-23 | Disposition: A | Discharge status: Home / Self Care | Payer: Medicare HMO | Source: Ambulatory Visit | Attending: Radiation Oncology | Admitting: Radiation Oncology

## 2013-04-23 VITALS — BP 109/70 | HR 86 | Temp 97.9°F | Ht 60.0 in | Wt 130.1 lb

## 2013-04-23 DIAGNOSIS — C787 Secondary malignant neoplasm of liver and intrahepatic bile duct: Secondary | ICD-10-CM

## 2013-04-23 NOTE — Progress Notes (Signed)
  Radiation Oncology         [phone] ________________________________  Name: April Spence MRN: [id number]  Date: 04/03/2013  DOB: Jul 03, 1956  SIMULATION AND TREATMENT PLANNING NOTE  DIAGNOSIS:  Liver metastasis  NARRATIVE:  The patient was brought to the CT Simulation planning suite.  Identity was confirmed.  All relevant records and images related to the planned course of therapy were reviewed.   Written consent to proceed with treatment was confirmed which was freely given after reviewing the details related to the planned course of therapy had been reviewed with the patient.  Then, the patient was set-up in a stable reproducible  supine position for radiation therapy.  CT images were obtained.  Surface markings were placed.  A customized VAC lock bag was constructed to help with the patient immobilization during treatment. This complex treatment device will be used on a daily basis.   The CT images were loaded into the planning software.  Then the target and avoidance structures were contoured.  Treatment planning then occurred.  The radiation prescription was entered and confirmed.  A total of 8  complex treatment devices were fabricated which relate to the designed radiation treatment fields. Each of these customized fields/ complex treatment devices will be used on a daily basis during the radiation course. I have requested : 3D Simulation  I have requested a DVH of the following structures: Target volume, liver, spinal cord, kidneys bilaterally, bowel.   The patient will undergo daily image guidance to ensure accurate localization of the target, and adequate minimize dose to the normal surrounding structures in close proximity to the target.   PLAN:  The patient will receive 35  Gy in  14 fractions.   Special treatment procedure The patient will receive chemotherapy during the course of radiation treatment. The patient may experience increased or overlapping toxicity due to this  combined-modality approach and the patient will be monitored for such problems. This may include extra lab work as necessary. This therefore constitutes a special treatment procedure.    ________________________________   Radene Gunning, MD, PhD

## 2013-04-23 NOTE — Progress Notes (Signed)
April Spence here for weekly under treat visit.  She has had 13 fractions to her liver.  She states that her pain is "no worse than usual."  She does have fatigue.  She has two mouth sores that is making it hard for her to eat.  Her 5FU was put on hold this week to give her a chance to heal.  She reports that her biliary drain is working well.  She denies nausea.

## 2013-04-23 NOTE — Progress Notes (Signed)
Department of Radiation Oncology  Phone:  [phone] Fax:        [phone]  Weekly Treatment Note    Name: April Spence Date: 04/23/2013 MRN: [id number] DOB: [birth year]   Current dose: 32.5 Gy  Current fraction: 13   MEDICATIONS: Current Outpatient Prescriptions  Medication Sig Dispense Refill  . ALPRAZolam (XANAX) 0.25 MG tablet Take 1 tablet (0.25 mg total) by mouth every 12 (twelve) hours as needed for sleep or anxiety.  40 tablet  0  . Alum & Mag Hydroxide-Simeth (MAGIC MOUTHWASH) SOLN Take 10 mLs by mouth 4 (four) times daily as needed. Swish and spit  240 mL  2  . amitriptyline (ELAVIL) 25 MG tablet Take 25 mg by mouth at bedtime.       . chlorhexidine (PERIDEX) 0.12 % solution Use as directed 15 mLs in the mouth or throat 2 (two) times daily.       . Cholecalciferol (VITAMIN D-3) 5000 UNITS TABS Take 5,000 mg by mouth 2 (two) times daily.      . diphenoxylate-atropine (LOMOTIL) 2.5-0.025 MG per tablet Take 1 tablet by mouth 4 (four) times daily as needed for diarrhea or loose stools.  30 tablet  1  . fenofibrate micronized (LOFIBRA) 134 MG capsule Take 134 mg by mouth every evening.       Marland Kitchen FLUoxetine (PROZAC) 20 MG capsule Take 20 mg by mouth every morning.       Marland Kitchen levothyroxine (SYNTHROID, LEVOTHROID) 150 MCG tablet Take 150 mcg by mouth daily before breakfast.      . lidocaine-prilocaine (EMLA) cream Apply topically as needed. APPLY TO PORTACATH SITE 1-2 HOURS PRIOR TO USE  30 g  5  . LORazepam (ATIVAN) 0.5 MG tablet Take 1 tablet (0.5 mg total) by mouth every 12 (twelve) hours as needed for anxiety.  30 tablet  2  . nystatin (NYSTOP) 100000 UNIT/GM POWD Apply 1 g topically daily as needed (for rash). In summer on irritated areas      . ondansetron (ZOFRAN) 8 MG tablet Take 1 tablet (8 mg total) by mouth every 12 (twelve) hours as needed for nausea.  20 tablet  2  . oxyCODONE-acetaminophen (PERCOCET/ROXICET) 5-325 MG per tablet Take 1 tablet by mouth every 4  (four) hours as needed for pain.  50 tablet  0  . PRESCRIPTION MEDICATION Pt gets chemotherapy at [facility]. Last treatment was on 02-11-13. Pt missed last treatment due to endo procedure. Followed by Dr Truett Perna      . prochlorperazine (COMPAZINE) 10 MG tablet Take 10 mg by mouth every 6 (six) hours as needed (nausea).       . propranolol (INDERAL) 80 MG tablet Take 80 mg by mouth every morning.       . traMADol-acetaminophen (ULTRACET) 37.5-325 MG per tablet Take 1 tablet by mouth every 6 (six) hours as needed for pain.        No current facility-administered medications for this encounter.     ALLERGIES: Adhesive   LABORATORY DATA:  Lab Results  Component Value Date   WBC 6.7 04/21/2013   HGB 12.7 04/21/2013   HCT 37.3 04/21/2013   MCV 96.6 04/21/2013   PLT 209 04/21/2013   Lab Results  Component Value Date   NA 133* 04/14/2013   K 4.0 04/14/2013   CL 97 03/21/2013   CO2 24 04/14/2013   Lab Results  Component Value Date   ALT 28 04/14/2013   AST 53* 04/14/2013   ALKPHOS 244* 04/14/2013  BILITOT 5.43* 04/14/2013     NARRATIVE: April Spence was seen today for weekly treatment management. The chart was checked and the patient's films were reviewed. The patient is doing well. Some occasional nausea and similar pain. No major changes in her symptoms overall she is done well with treatment.  PHYSICAL EXAMINATION: height is 5' (1.524 m) and weight is 130 lb 1.6 oz (59.013 kg). Her temperature is 97.9 F (36.6 C). Her blood pressure is 109/70 and her pulse is 86.        ASSESSMENT: The patient is doing satisfactorily with treatment.  PLAN: We will continue with the patient's radiation treatment as planned. I will see the patient back for followup in one month.

## 2013-04-24 ENCOUNTER — Encounter: Payer: Self-pay | Admitting: Radiation Oncology

## 2013-04-24 ENCOUNTER — Ambulatory Visit: Admission: RE | Admit: 2013-04-24 | Payer: Medicare HMO | Source: Ambulatory Visit

## 2013-04-24 ENCOUNTER — Ambulatory Visit
Admission: RE | Admit: 2013-04-24 | Discharge: 2013-04-24 | Disposition: A | Discharge status: Home / Self Care | Payer: Medicare HMO | Source: Ambulatory Visit | Attending: Radiation Oncology | Admitting: Radiation Oncology

## 2013-04-25 ENCOUNTER — Telehealth: Payer: Self-pay | Admitting: Oncology

## 2013-04-25 ENCOUNTER — Telehealth: Payer: Self-pay | Admitting: Dietician

## 2013-04-25 ENCOUNTER — Ambulatory Visit (HOSPITAL_BASED_OUTPATIENT_CLINIC_OR_DEPARTMENT_OTHER): Payer: Medicare HMO | Admitting: Oncology

## 2013-04-25 ENCOUNTER — Ambulatory Visit: Payer: Medicare HMO

## 2013-04-25 VITALS — BP 109/59 | HR 79 | Temp 97.9°F | Resp 18 | Ht 61.0 in | Wt 130.3 lb

## 2013-04-25 DIAGNOSIS — C189 Malignant neoplasm of colon, unspecified: Secondary | ICD-10-CM

## 2013-04-25 DIAGNOSIS — R5383 Other fatigue: Secondary | ICD-10-CM

## 2013-04-25 DIAGNOSIS — K1231 Oral mucositis (ulcerative) due to antineoplastic therapy: Secondary | ICD-10-CM

## 2013-04-25 DIAGNOSIS — C787 Secondary malignant neoplasm of liver and intrahepatic bile duct: Secondary | ICD-10-CM

## 2013-04-25 DIAGNOSIS — K831 Obstruction of bile duct: Secondary | ICD-10-CM

## 2013-04-25 DIAGNOSIS — C180 Malignant neoplasm of cecum: Secondary | ICD-10-CM

## 2013-04-25 NOTE — Telephone Encounter (Signed)
Dr Christella Hartigan I sent you a message from [facility] about this pt, Dr Truett Perna says the pt does not need the colon. I scheduled her from a recall.  Please advise

## 2013-04-25 NOTE — Telephone Encounter (Signed)
gv and pritnecd appt sched and avs for pt.

## 2013-04-25 NOTE — Progress Notes (Signed)
Reports biliary drain output 100 cc tid (300cc/day). Flushes drain twice daily. Due for exchange on 04/30/13 in interventional radiology department.

## 2013-04-25 NOTE — Telephone Encounter (Signed)
Brief Outpatient Oncology Nutrition Note  Patient has been identified to be at risk on malnutrition screen.  Wt Readings from Last 10 Encounters:  04/25/13 130 lb 4.8 oz (59.104 kg)  04/23/13 130 lb 1.6 oz (59.013 kg)  04/16/13 130 lb 8 oz (59.194 kg)  04/01/13 129 lb 3.2 oz (58.605 kg)  03/28/13 131 lb (59.421 kg)  03/25/13 131 lb (59.421 kg)  03/21/13 131 lb (59.421 kg)  03/18/13 131 lb 12.8 oz (59.784 kg)  03/12/13 134 lb 11.2 oz (61.1 kg)  02/25/13 136 lb 1.6 oz (61.735 kg)    Called patient after chart review secondary to note of mouth sores.  Patient reports eating soft foods and drinking Boost.  Trying to increase calories with what she eats.  Encouraged patient to call if needed.    Oran Rein, RD, LDN

## 2013-04-25 NOTE — Progress Notes (Signed)
[facility]    OFFICE PROGRESS NOTE   INTERVAL HISTORY:   She returns as scheduled. Radiation was completed yesterday. The final 3 days of the 5-fluorouracil were not given secondary to mucositis. She has developed sores over the tongue and lips. She has noted bleeding of the lips. She complains of malaise and mild nausea. No hand or foot pain.  Ms. Clune is scheduled for a biliary drain exchange on 04/30/2013.  Objective:  Vital signs in last 24 hours:  Blood pressure 109/59, pulse 79, temperature 97.9 F (36.6 C), temperature source Oral, resp. rate 18, height 5\' 1"  (1.549 m), weight 130 lb 4.8 oz (59.104 kg).    HEENT: No thrush. Ulcers at the left side of the tongue, inner lip, and right upper posterior gum line. Scleral and oral icterus. Resp: Decreased breath sounds at the right chest with inspiratory rhonchi, no respiratory distress Cardio: Regular rate and rhythm GI: Left upper quadrant biliary drain site with mild erythema at the skin exit, nontender, no hepatomegaly Vascular: No leg edema  Portacath/PICC-without erythema  Lab Results:  Lab Results  Component Value Date   WBC 6.7 04/21/2013   HGB 12.7 04/21/2013   HCT 37.3 04/21/2013   MCV 96.6 04/21/2013   PLT 209 04/21/2013   ANC 5.2   Medications: I have reviewed the patient's current medications.  Assessment/Plan: 1. 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 She completed 12 cycles of FOLFOX chemotherapy. Oxaliplatin was held with cycles number 5, 8, 11, and 12. 2. Metastatic colon cancer confirmed on a restaging CT evaluation 05/02/2011 with liver metastases and periportal lymphadenopathy. She began treatment with FOLFIRI on 06/01/2011. Avastin was added beginning with cycle number 2. She completed cycle #6 08/08/2011. Restaging CT evaluation 08/15/2011 showed improvement in  the liver metastases and porta hepatis lymphadenopathy. She completed cycle #12 FOLFIRI/Avastin 11/07/2011. Restaging CT evaluation 11/24/2011 showed decrease in size of hepatic metastases and no evidence of disease progression or new metastasis. She completed cycle 18 on 03/11/2012. A restaging CT 03/29/2012 revealed stable disease. Irinotecan was discontinued and treatment continued with 5-FU/leucovorin and Avastin on a 3 week schedule beginning 04/02/2012. -restaging CT 08/15/2012 with slight enlargement of the central liver lesion and progression of intrahepatic biliary dilatation, no other liver lesions and no other evidence of progressive metastatic disease. CEA stable on 08/20/2012. Treatment continued with 5-FU and Avastin on a 3 week schedule.restaging CT 09/30/2012 found the dominant central right liver metastasis to be slightly larger with progressive intrahepatic biliary dilatation, a previously described left hepatic lobe lesion appeared smaller measuring 10 mm and appeared separate from the liver and is felt to most likely represent a porta hepatis lymph node.A. Suspected 9 mm metastasis was noted in the medial left hepatic lobe on an MRI 10/10/2012 -status post stereotactic radiosurgeryto the dominant liver metastasis in 3 fractions February 10 through 10/28/2012. 5-FU/Avastin was resumed on a 3 week schedule 12/03/2012. CEA in normal range on 12/03/2012 and 01/14/2013. Restaging MRI of the liver 02/21/2013 with a decrease in the dominant right hepatic lesion, stable medial left hepatic lobe lesion-but the left lesion is now causing obstruction of the left hepatic bile ducts. 3. Status post Port-A-Cath placement and Port-A-Cath removal by Dr. Michaell Cowing. A new Port-A-Cath was placed on 05/16/2011. The Port-A-Cath was removed due to malposition. A new Port-A-Cath was placed on 05/30/2011. 4. Spina  bifida with severe scoliosis.  5. Chronic bilateral foot drop/lower leg and foot  numbness. 6. Hypothyroidism. 7. History of chemotherapy-induced diarrhea. The bolus and infusional 5-FU were reduced by 25% beginning with cycle number 2 of FOLFOX. The 5-FU dose was decreased by an additional 10% beginning with cycle number 8. 8. History of delayed nausea following chemotherapy. 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 The oxaliplatin was dose reduced by 20% beginning with cycle number 6. The neuropathy symptoms resolved. 10. History of neutropenia secondary to chemotherapy. 11. Status post a colonoscopy 05/05/2010. There was a previous right colectomy anastomosis and slight nodularity. Biopsies were taken which showed benign colonic mucosa with hyperplastic epithelial changes. No significant inflammation, granuloma, or adenomatous epithelium was identified. 12. Diarrhea following FOLFIRI chemotherapy. Overall the diarrhea is controlled with Lomotil.  13. History of a fungal rash in the groin, improved with Diflucan and nystatin powder. 14. Mouth ulcers, oral candidiasis following cycle #6 of FOLFIRI-the ulcers resolved following treatment with Diflucan. She continues to note mild mouth ulcers following each treatment. She developed mucositis during infusional 5-fluorouracil/concurrent radiation given beginning on 04/07/2013. 15. Nausea during chemotherapy-Zofran was added to the chemotherapy regimen beginning on 07/09/2012. 16. Oozing at the gumline secondary to periodontal disease and Avastin 17. Jaundice secondary to biliary obstruction status post placement of a metallic biliary stent on 02/27/2013 without improvement in LFTs. Status post placement of a percutaneous biliary drain by interventional radiology on 03/13/2013. She underwent a cholangiogram on 03/21/2013 with evidence of a high-grade obstruction at the level of the previously placed metallic biliary stent. The catheter could not be advanced. The external drainage catheter was upsized. -Palliative  radiation and infusional 5-FU given 04/07/2013 through 04/24/2013 in an attempt to shrink the liver metastasis so the biliary catheters can be advanced 18. Mucositis secondary chemotherapy    Disposition:  Ms. Marcelli has completed palliative radiation to the dominant liver metastasis that is felt to be causing biliary obstruction. She received concurrent infusional 5-FU and developed mucositis. She is scheduled for a biliary drain exchange on 04/30/2013. I will contact interventional radiology to discuss the case and see if they can evaluate the obstruction on 04/30/2013.  Ms. Dahmer will return for an office visit and Port-A-Cath flush on 05/22/2013.   Thornton Papas, MD  04/25/2013  11:46 AM

## 2013-04-27 NOTE — Telephone Encounter (Signed)
She does not need the colonoscopy

## 2013-04-28 ENCOUNTER — Ambulatory Visit: Payer: Medicare HMO

## 2013-04-28 NOTE — Telephone Encounter (Signed)
The patient has been notified of this information and all questions answered. Cx with [facility] endo

## 2013-04-29 ENCOUNTER — Ambulatory Visit: Payer: Medicare HMO

## 2013-04-29 ENCOUNTER — Other Ambulatory Visit: Payer: Self-pay | Admitting: Radiology

## 2013-04-29 ENCOUNTER — Encounter (HOSPITAL_COMMUNITY): Payer: Self-pay | Admitting: Pharmacy Technician

## 2013-04-30 ENCOUNTER — Ambulatory Visit (HOSPITAL_COMMUNITY)
Admission: RE | Admit: 2013-04-30 | Discharge: 2013-04-30 | Disposition: A | Discharge status: Home / Self Care | Payer: Medicare HMO | Source: Ambulatory Visit | Attending: Diagnostic Radiology | Admitting: Diagnostic Radiology

## 2013-04-30 ENCOUNTER — Other Ambulatory Visit (HOSPITAL_COMMUNITY): Payer: Self-pay | Admitting: Diagnostic Radiology

## 2013-04-30 ENCOUNTER — Ambulatory Visit: Payer: Medicare HMO

## 2013-04-30 VITALS — BP 111/72 | HR 77 | Temp 97.7°F | Resp 13

## 2013-04-30 DIAGNOSIS — R52 Pain, unspecified: Secondary | ICD-10-CM

## 2013-04-30 DIAGNOSIS — E039 Hypothyroidism, unspecified: Secondary | ICD-10-CM | POA: Insufficient documentation

## 2013-04-30 DIAGNOSIS — C189 Malignant neoplasm of colon, unspecified: Secondary | ICD-10-CM | POA: Insufficient documentation

## 2013-04-30 DIAGNOSIS — E785 Hyperlipidemia, unspecified: Secondary | ICD-10-CM | POA: Insufficient documentation

## 2013-04-30 DIAGNOSIS — Q059 Spina bifida, unspecified: Secondary | ICD-10-CM | POA: Insufficient documentation

## 2013-04-30 DIAGNOSIS — Z9221 Personal history of antineoplastic chemotherapy: Secondary | ICD-10-CM | POA: Insufficient documentation

## 2013-04-30 DIAGNOSIS — K831 Obstruction of bile duct: Secondary | ICD-10-CM | POA: Insufficient documentation

## 2013-04-30 DIAGNOSIS — C787 Secondary malignant neoplasm of liver and intrahepatic bile duct: Secondary | ICD-10-CM | POA: Insufficient documentation

## 2013-04-30 DIAGNOSIS — M4120 Other idiopathic scoliosis, site unspecified: Secondary | ICD-10-CM | POA: Insufficient documentation

## 2013-04-30 DIAGNOSIS — Z923 Personal history of irradiation: Secondary | ICD-10-CM | POA: Insufficient documentation

## 2013-04-30 LAB — COMPREHENSIVE METABOLIC PANEL
AST: 46 U/L — ABNORMAL HIGH (ref 0–37)
Albumin: 2.6 g/dL — ABNORMAL LOW (ref 3.5–5.2)
Alkaline Phosphatase: 266 U/L — ABNORMAL HIGH (ref 39–117)
Chloride: 96 mEq/L (ref 96–112)
Creatinine, Ser: 0.28 mg/dL — ABNORMAL LOW (ref 0.50–1.10)
Potassium: 3.6 mEq/L (ref 3.5–5.1)
Sodium: 133 mEq/L — ABNORMAL LOW (ref 135–145)
Total Bilirubin: 4.8 mg/dL — ABNORMAL HIGH (ref 0.3–1.2)

## 2013-04-30 LAB — CBC WITH DIFFERENTIAL/PLATELET
Eosinophils Absolute: 0.2 10*3/uL (ref 0.0–0.7)
Eosinophils Relative: 3 % (ref 0–5)
Lymphs Abs: 0.4 10*3/uL — ABNORMAL LOW (ref 0.7–4.0)
MCH: 32.6 pg (ref 26.0–34.0)
MCV: 97.1 fL (ref 78.0–100.0)
Platelets: 167 10*3/uL (ref 150–400)
RBC: 4.11 MIL/uL (ref 3.87–5.11)
RDW: 16.3 % — ABNORMAL HIGH (ref 11.5–15.5)

## 2013-04-30 LAB — PROTIME-INR: INR: 1.43 (ref 0.00–1.49)

## 2013-04-30 MED ORDER — HEPARIN SOD (PORK) LOCK FLUSH 100 UNIT/ML IV SOLN
INTRAVENOUS | Status: AC
  Administered 2013-04-30: 500 [IU]
  Filled 2013-04-30: qty 5

## 2013-04-30 MED ORDER — SODIUM CHLORIDE 0.9 % IV SOLN
INTRAVENOUS | Status: DC
  Administered 2013-04-30: 13:00:00 via INTRAVENOUS

## 2013-04-30 MED ORDER — IOHEXOL 300 MG/ML  SOLN: 150.0000 mL | Freq: Once | INTRAMUSCULAR | Status: AC | PRN

## 2013-04-30 MED ORDER — MIDAZOLAM HCL 2 MG/2ML IJ SOLN
INTRAMUSCULAR | Status: AC
  Filled 2013-04-30: qty 6

## 2013-04-30 MED ORDER — SODIUM CHLORIDE 0.9 % IJ SOLN
10.0000 mL | Freq: Once | INTRAMUSCULAR | Status: AC
  Administered 2013-04-30: 10 mL

## 2013-04-30 MED ORDER — PIPERACILLIN-TAZOBACTAM 3.375 G IVPB
3.3750 g | Freq: Once | INTRAVENOUS | Status: AC
  Administered 2013-04-30: 3.375 g via INTRAVENOUS
  Filled 2013-04-30: qty 50

## 2013-04-30 MED ORDER — FENTANYL CITRATE 0.05 MG/ML IJ SOLN
INTRAMUSCULAR | Status: AC
  Filled 2013-04-30: qty 6

## 2013-04-30 MED ORDER — FENTANYL CITRATE 0.05 MG/ML IJ SOLN
INTRAMUSCULAR | Status: AC | PRN
  Administered 2013-04-30: 100 ug via INTRAVENOUS
  Administered 2013-04-30 (×2): 50 ug via INTRAVENOUS

## 2013-04-30 MED ORDER — MIDAZOLAM HCL 2 MG/2ML IJ SOLN
INTRAMUSCULAR | Status: AC | PRN
  Administered 2013-04-30: 3 mg via INTRAVENOUS
  Administered 2013-04-30: 1 mg via INTRAVENOUS

## 2013-04-30 NOTE — Progress Notes (Signed)
  Radiation Oncology         [phone] ________________________________  Name: April Spence MRN: [id number]  Date: 04/24/2013  DOB: August 01, 1956  End of Treatment Note  Diagnosis:   Metastatic colon cancer with liver metastasis     Indication for treatment:  Palliative        Radiation treatment dates:   04/07/2013 through 04/24/2013  Site/dose:   The patient was treated to a metastasis within the liver to a dose of 45 gray at 2.5 gray per fraction. Chemotherapy was given concurrently. The patient's treatment consisted of an 8 field 3-D conformal technique.  Narrative: The patient tolerated radiation treatment relatively well.   She did not exhibit any significant nausea, diarrhea, or other GI issues.  Plan: The patient has completed radiation treatment. The patient will return to radiation oncology clinic for routine followup in one month. I advised the patient to call or return sooner if they have any questions or concerns related to their recovery or treatment. ________________________________  Radene Gunning, M.D., Ph.D.

## 2013-04-30 NOTE — H&P (Signed)
April Spence is an 57 y.o. female.   Chief Complaint: "I'm having work done on my abdominal drain" HPI: Patient with history of metastatic colorectal carcinoma with biliary obstruction and placement of a left external biliary drain on 03/13/2013. She presents today for drain exchange and attempt at internalization of the drain if possible.  Past Medical History  Diagnosis Date  . colon ca dx'd 04/2009    chemo comp 11/2009.. colon and liver  . Spina bifida   . Scoliosis   . Hyperlipidemia   . Foot drop     bilateral,wears braces  . Mitral valve prolapse   . Hypothyroidism   . Depression   . History of chemotherapy     5-FU and Avastin 12 cycles FOLFOX  . Colon cancer 05/06/2010    metastatic  . Hepatic metastases 08/15/12    per ct abdomen  . Radiation 10/21/12-10/28/12    Palliative liver mets 54 gray in 3 fx's  . Arrhythmia 02-26-13    "rapid heart rate occ."  . History of radiation therapy 10/21/2012-10/28/2012    54 gray to liver metastasis    Past Surgical History  Procedure Laterality Date  . Cholecystectomy    . Back surgery  1980 and 2010  . Knee arthroscopy  2005    left  . Tubal ligation  1973  . Colonoscopy  05/05/2010  . Colon surgery      2010  . Portacath placement  05/30/2011    tip at cavoatrialjjunction by xray  . Ercp N/A 02/27/2013    Procedure: ENDOSCOPIC RETROGRADE CHOLANGIOPANCREATOGRAPHY (ERCP);  Surgeon: Rachael Fee, MD;  Location: [facility];  Service: Endoscopy;  Laterality: N/A;  . Biliary stent placement N/A 02/27/2013    Procedure: BILIARY STENT PLACEMENT;  Surgeon: Rachael Fee, MD;  Location: [facility];  Service: Endoscopy;  Laterality: N/A;  . Percutaneous transhepatic cholangiograhpy and billiary drainage  03/21/2013    Family History  Problem Relation Age of Onset  . Cancer Sister     breast  . Cancer Maternal Grandmother     breast  . Cancer Other 75    breast ca   Social History:  reports that she quit smoking about 6  years ago. She has never used smokeless tobacco. She reports that she does not drink alcohol or use illicit drugs.  Allergies:  Allergies  Allergen Reactions  . Adhesive [Tape]     Pt is fine with paper tape    Current outpatient prescriptions:amitriptyline (ELAVIL) 25 MG tablet, Take 25 mg by mouth at bedtime. , Disp: , Rfl: ;  Cholecalciferol (VITAMIN D-3) 5000 UNITS TABS, Take 5,000 mg by mouth 2 (two) times daily., Disp: , Rfl: ;  diphenoxylate-atropine (LOMOTIL) 2.5-0.025 MG per tablet, Take 1 tablet by mouth 4 (four) times daily as needed for diarrhea or loose stools., Disp: , Rfl:  fenofibrate micronized (LOFIBRA) 134 MG capsule, Take 134 mg by mouth every evening. , Disp: , Rfl: ;  FLUoxetine (PROZAC) 20 MG capsule, Take 20 mg by mouth every morning. , Disp: , Rfl: ;  levothyroxine (SYNTHROID, LEVOTHROID) 150 MCG tablet, Take 150 mcg by mouth daily before breakfast., Disp: , Rfl: ;  LORazepam (ATIVAN) 0.5 MG tablet, Take 1 tablet (0.5 mg total) by mouth every 12 (twelve) hours as needed for anxiety., Disp: 30 tablet, Rfl: 2 nystatin (NYSTOP) 100000 UNIT/GM POWD, Apply 1 g topically daily as needed (for rash). In summer on irritated areas, Disp: , Rfl: ;  ondansetron (ZOFRAN) 8  MG tablet, Take 1 tablet (8 mg total) by mouth every 12 (twelve) hours as needed for nausea., Disp: 20 tablet, Rfl: 2;  oxyCODONE-acetaminophen (PERCOCET/ROXICET) 5-325 MG per tablet, Take 1 tablet by mouth every 4 (four) hours as needed for pain., Disp: , Rfl:  prochlorperazine (COMPAZINE) 10 MG tablet, Take 10 mg by mouth every 6 (six) hours as needed (nausea). , Disp: , Rfl: ;  propranolol (INDERAL) 80 MG tablet, Take 80 mg by mouth every morning. , Disp: , Rfl: ;  traMADol-acetaminophen (ULTRACET) 37.5-325 MG per tablet, Take 1 tablet by mouth every 6 (six) hours as needed for pain. , Disp: , Rfl:  ALPRAZolam (XANAX) 0.25 MG tablet, Take 0.25 mg by mouth 2 (two) times daily as needed for sleep or anxiety., Disp: , Rfl:  ;  Alum & Mag Hydroxide-Simeth (MAGIC MOUTHWASH) SOLN, Take 10 mL by mouth 4 (four) times daily as needed. Mouthsores, Disp: , Rfl: ;  chlorhexidine (PERIDEX) 0.12 % solution, Use as directed 15 mLs in the mouth or throat 2 (two) times daily. , Disp: , Rfl:  lidocaine-prilocaine (EMLA) cream, Apply 1 application topically as needed (use on port)., Disp: , Rfl: ;  PRESCRIPTION MEDICATION, Pt gets chemotherapy at [facility]. Last treatment was on 02-11-13. Pt missed last treatment due to endo procedure. Followed by Dr Truett Perna, Disp: , Rfl:  Current facility-administered medications:0.9 %  sodium chloride infusion, , Intravenous, Continuous, Brayton El, PA-C, Last Rate: 20 mL/hr at 04/30/13 1247;  piperacillin-tazobactam (ZOSYN) IVPB 3.375 g, 3.375 g, Intravenous, Once, Brayton El, PA-C   Results for orders placed during the hospital encounter of 04/30/13 (from the past 48 hour(s))  CBC WITH DIFFERENTIAL     Status: Abnormal   Collection Time    04/30/13 12:15 PM      Result Value Range   WBC 7.2  4.0 - 10.5 K/uL   RBC 4.11  3.87 - 5.11 MIL/uL   Hemoglobin 13.4  12.0 - 15.0 g/dL   HCT 40.9  81.1 - 91.4 %   MCV 97.1  78.0 - 100.0 fL   MCH 32.6  26.0 - 34.0 pg   MCHC 33.6  30.0 - 36.0 g/dL   RDW 78.2 (*) 95.6 - 21.3 %   Platelets 167  150 - 400 K/uL   Neutrophils Relative % 72  43 - 77 %   Neutro Abs 5.2  1.7 - 7.7 K/uL   Lymphocytes Relative 6 (*) 12 - 46 %   Lymphs Abs 0.4 (*) 0.7 - 4.0 K/uL   Monocytes Relative 20 (*) 3 - 12 %   Monocytes Absolute 1.4 (*) 0.1 - 1.0 K/uL   Eosinophils Relative 3  0 - 5 %   Eosinophils Absolute 0.2  0.0 - 0.7 K/uL   Basophils Relative 0  0 - 1 %   Basophils Absolute 0.0  0.0 - 0.1 K/uL  PROTIME-INR     Status: Abnormal   Collection Time    04/30/13 12:15 PM      Result Value Range   Prothrombin Time 17.1 (*) 11.6 - 15.2 seconds   INR 1.43  0.00 - 1.49   No results found.  Review of Systems  Constitutional: Negative for fever and chills.  HENT:        Occ nosebleeds  Respiratory: Negative for cough and shortness of breath.   Cardiovascular: Negative for chest pain.  Gastrointestinal: Negative for vomiting and abdominal pain.       Intermittent nausea  Musculoskeletal: Negative for back  pain.  Neurological: Negative for headaches.    There were no vitals taken for this visit. Physical Exam  Constitutional: She is oriented to person, place, and time. She appears well-developed and well-nourished.  Cardiovascular: Normal rate and regular rhythm.   Respiratory: Effort normal.  Sl dim BS right mid to lower fields, left clear  GI: Soft. Bowel sounds are normal. There is no tenderness.  Intact left biliary drain   Musculoskeletal: She exhibits no edema.  Hx spina bifida, chronic bilateral foot drop  Neurological: She is alert and oriented to person, place, and time.     Assessment/Plan Pt with hx of metastatic colorectal carcinoma and biliary obstruction, s/p left external biliary drain placement 03/13/13. Plan is for drain exchange and possible internalization today. Details/risks of procedure d/w pt/family with their understanding and consent.  Dallen Bunte,D KEVIN 04/30/2013, 12:51 PM

## 2013-04-30 NOTE — Procedures (Signed)
Interventional Radiology Procedure Note  Procedure:  1.) Successful internalization of biliary drain 2.) New 90F int-ext biliary drain placed Complications: none Recommendations: - Maintain to gravity bag x 24 hrs, then cap - Labs in 1 week to assess Br - Return to IR in 6 weeks for tube check/change  Signed,  Sterling Big, MD Vascular & Interventional Radiologist [facility]

## 2013-04-30 NOTE — H&P (Signed)
Agree with PA note.  Signed,  Heath K. McCullough, MD Vascular & Interventional [facility]iology [facility]  

## 2013-05-01 ENCOUNTER — Ambulatory Visit: Payer: Medicare HMO

## 2013-05-08 ENCOUNTER — Encounter (HOSPITAL_COMMUNITY): Admission: RE | Payer: Self-pay | Source: Ambulatory Visit

## 2013-05-08 ENCOUNTER — Ambulatory Visit (HOSPITAL_COMMUNITY): Admission: RE | Admit: 2013-05-08 | Payer: Medicare HMO | Source: Ambulatory Visit | Admitting: Gastroenterology

## 2013-05-08 SURGERY — COLONOSCOPY
Anesthesia: Moderate Sedation

## 2013-05-15 ENCOUNTER — Other Ambulatory Visit: Payer: Self-pay | Admitting: *Deleted

## 2013-05-15 ENCOUNTER — Telehealth: Payer: Self-pay | Admitting: *Deleted

## 2013-05-15 DIAGNOSIS — C787 Secondary malignant neoplasm of liver and intrahepatic bile duct: Secondary | ICD-10-CM

## 2013-05-15 NOTE — Telephone Encounter (Signed)
Left message on voicemail requesting pt call office. Dr. Truett Perna recommends checking [facility] 05/16/13. Orders entered, request to schedulers to arrange appt.

## 2013-05-16 ENCOUNTER — Ambulatory Visit (HOSPITAL_BASED_OUTPATIENT_CLINIC_OR_DEPARTMENT_OTHER): Payer: Medicare HMO | Admitting: Lab

## 2013-05-16 DIAGNOSIS — C180 Malignant neoplasm of cecum: Secondary | ICD-10-CM

## 2013-05-16 DIAGNOSIS — C189 Malignant neoplasm of colon, unspecified: Secondary | ICD-10-CM

## 2013-05-16 LAB — COMPREHENSIVE METABOLIC PANEL (CC13)
ALT: 34 U/L (ref 0–55)
CO2: 27 mEq/L (ref 22–29)
Calcium: 9.8 mg/dL (ref 8.4–10.4)
Chloride: 99 mEq/L (ref 98–109)
Creatinine: 0.5 mg/dL — ABNORMAL LOW (ref 0.6–1.1)

## 2013-05-16 LAB — CBC WITH DIFFERENTIAL/PLATELET
BASO%: 0.3 % (ref 0.0–2.0)
HCT: 35.4 % (ref 34.8–46.6)
MCHC: 34 g/dL (ref 31.5–36.0)
MONO#: 1 10*3/uL — ABNORMAL HIGH (ref 0.1–0.9)
RBC: 3.68 10*6/uL — ABNORMAL LOW (ref 3.70–5.45)
RDW: 18.8 % — ABNORMAL HIGH (ref 11.2–14.5)
WBC: 7.8 10*3/uL (ref 3.9–10.3)
lymph#: 0.7 10*3/uL — ABNORMAL LOW (ref 0.9–3.3)

## 2013-05-17 LAB — CEA: CEA: 2.9 ng/mL (ref 0.0–5.0)

## 2013-05-22 ENCOUNTER — Telehealth: Payer: Self-pay | Admitting: Oncology

## 2013-05-22 ENCOUNTER — Ambulatory Visit: Payer: Medicare HMO

## 2013-05-22 ENCOUNTER — Ambulatory Visit (HOSPITAL_BASED_OUTPATIENT_CLINIC_OR_DEPARTMENT_OTHER): Payer: Medicare HMO | Admitting: Nurse Practitioner

## 2013-05-22 ENCOUNTER — Other Ambulatory Visit: Payer: Medicare HMO | Admitting: Lab

## 2013-05-22 ENCOUNTER — Telehealth: Payer: Self-pay | Admitting: *Deleted

## 2013-05-22 ENCOUNTER — Other Ambulatory Visit (HOSPITAL_COMMUNITY): Payer: Self-pay | Admitting: Interventional Radiology

## 2013-05-22 VITALS — BP 103/66 | HR 72 | Temp 97.5°F | Resp 20 | Ht 60.0 in | Wt 128.5 lb

## 2013-05-22 DIAGNOSIS — K831 Obstruction of bile duct: Secondary | ICD-10-CM

## 2013-05-22 DIAGNOSIS — C787 Secondary malignant neoplasm of liver and intrahepatic bile duct: Secondary | ICD-10-CM

## 2013-05-22 DIAGNOSIS — C801 Malignant (primary) neoplasm, unspecified: Secondary | ICD-10-CM

## 2013-05-22 DIAGNOSIS — C189 Malignant neoplasm of colon, unspecified: Secondary | ICD-10-CM

## 2013-05-22 NOTE — Progress Notes (Signed)
Patient does not want her port accessed for blood draw, patient wants her blood to be taken from her arm per Selena Batten, laboratory receptionist.

## 2013-05-22 NOTE — Telephone Encounter (Signed)
Received message from pt's husband stating "biliary tube is blocked"  Called and spoke with husband, reports when he put bag over area, it would not drain and he tried to flush and it "was rock solid".  Pt's husband also called radiology and they have appt 9/12 @ 2:30.  Instructed husband that MD will be made aware and if further questions, call office.  Pt's husband verbalized understanding and expressed appreciation for call.

## 2013-05-22 NOTE — Progress Notes (Addendum)
OFFICE PROGRESS NOTE  Interval history:  April Spence is a 57 year old normal with metastatic colon cancer seen today in followup since undergoing internalization of a biliary drain on 04/30/2013. A new external biliary drain was placed as well.  Her husband feels she is more jaundiced. She does not agree. They intermittently note drainage around the external biliary drain site. She has had no fever. She is intermittently nauseated. No vomiting. She has occasional pain at the right abdomen. Stable chronic back pain. She takes Percocet as needed. Periodic loose stools. She takes Lomotil as needed. Mouth sores have resolved. No bleeding. Appetite "comes and goes". No unusual headaches. No vision change. She notes she is "more tired".   Objective: Blood pressure 103/66, pulse 72, temperature 97.5 F (36.4 C), temperature source Oral, resp. rate 20, height 5' (1.524 m), weight 128 lb 8 oz (58.287 kg).  She appears jaundiced. Scleral icterus. No mouth ulcerations. Inspiratory rales at both lung bases. No respiratory distress. Regular cardiac rhythm. No murmur. Port-A-Cath site is without erythema. Abdomen is soft and nontender. No hepatomegaly. Left abdomen external biliary drain site without erythema. No leg edema.  Lab Results: Lab Results  Component Value Date   WBC 7.8 05/16/2013   HGB 12.0 05/16/2013   HCT 35.4 05/16/2013   MCV 96.3 05/16/2013   PLT 213 05/16/2013    Chemistry:    Chemistry      Component Value Date/Time   NA 135* 05/16/2013 1524   NA 133* 04/30/2013 1215   NA 136 05/02/2011 0954   K 4.0 05/16/2013 1524   K 3.6 04/30/2013 1215   K 4.5 05/02/2011 0954   CL 96 04/30/2013 1215   CL 99 02/25/2013 1124   CL 93* 05/02/2011 0954   CO2 27 05/16/2013 1524   CO2 28 04/30/2013 1215   CO2 29 05/02/2011 0954   BUN 5.9* 05/16/2013 1524   BUN 7 04/30/2013 1215   BUN 12 05/02/2011 0954   CREATININE 0.5* 05/16/2013 1524   CREATININE 0.28* 04/30/2013 1215   CREATININE 0.5* 05/02/2011 0954      Component  Value Date/Time   CALCIUM 9.8 05/16/2013 1524   CALCIUM 10.2 04/30/2013 1215   CALCIUM 9.3 05/02/2011 0954   ALKPHOS 525* 05/16/2013 1524   ALKPHOS 266* 04/30/2013 1215   ALKPHOS 78 05/02/2011 0954   AST 73* 05/16/2013 1524   AST 46* 04/30/2013 1215   AST 31 05/02/2011 0954   ALT 34 05/16/2013 1524   ALT 24 04/30/2013 1215   ALT 26 05/02/2011 0954   BILITOT 7.14* 05/16/2013 1524   BILITOT 4.8* 04/30/2013 1215   BILITOT 0.40 05/02/2011 0954     Labs 05/21/2013 hemoglobin 11.7, white count 7.3, platelet count 187,000, sodium 133, potassium 4.0, BUN 7, creatinine 0.22, calcium 9.0, total bilirubin 5.5, alkaline phosphatase 496, CEA 3.1.  Studies/Results: Ir Cholan Exist Tube  05/01/2013   *RADIOLOGY REPORT*  CHOLANGIOGRAM VIA EXISTING CATHETER, BILIARY DILATION, IR CATHETER TUBE CHANGE  Date: 04/30/2013  Clinical History: 56 year old female with colon cancer metastatic to the liver resulting in central obstruction of the left intrahepatic biliary ductal system and obstructive jaundice.  Her anatomy is atypical secondary to underlying scoliosis.  She has a metal stent which extends from the right common intrahepatic biliary duct into the common bile duct.  She has an existing left-sided externalized percutaneous biliary drain. Internalization was not possible previously secondary to complete obstruction by the malignant mass.  She has now undergone palliative radiation therapy.  A second attempt  at internalization is planned. The  Procedures Performed:  1. Percutaneous through the sheath cholangiogram in multiple obliquities. 2.  Successful navigation of malignant stricture and internalization into the duodenum 3.  Cholangioplasty of malignant stricture and balloon disruption of stent interstices 4.  Placement of an internal/external 10-French biliary stent  Interventional Radiologist:  Sterling Big, MD  Sedation: Moderate (conscious) sedation was used.  Four mg Versed, 200 mcg Fentanyl were administered  intravenously.  The patient's vital signs were monitored continuously by radiology nursing throughout the procedure.  Sedation Time: 50 minutes  Fluoroscopy time: 17 minutes 6 seconds  Contrast volume: 30 ml Omnipaque-300  Antibiotic Prophylaxis:  3.375 grams was administered within 1 hour of procedure  PROCEDURE/FINDINGS:   Informed consent was obtained from the patient following explanation of the procedure, risks, benefits and alternatives. The patient understands, agrees and consents for the procedure. All questions were addressed. A time out was performed.  Maximal barrier sterile technique utilized including caps, mask, sterile gowns, sterile gloves, large sterile drape, hand hygiene, and betadine skin prep.  Local anesthesia was attained by infiltration of 1% lidocaine around the tube insertion site.  The retaining suture was cut.  The tube was transected removed over a short Amplatz wire.  An 8-French vascular sheath was advanced over the wire and positioned within the left intrahepatic biliary ductal tree.  A through the sheath cholangiogram was then performed in multiple obliquities. Initially, it appeared that the left biliary tree remained completely obstructed.  However, upon maximal distension of the biliary tree (the patient experienced significant discomfort) a thin of the right of contrast material could be seen heading toward the metal biliary stent and central ducts.  This was interrogated in multiple obliquities into the true pad is identified.  Using an angled catheter and a glide wire, the tract was successfully navigated.  The wire was passed through the interstices of the biliary stent and into the common bile duct and thereafter into the duodenum.  The glide wire was exchanged for an Amplatz wire.  The malignant biliary stricture in the inter studies of this stents were angioplastied to 4 mm using a 4 x 20 mm Mustang balloon.  This was necessary to facilitate passage of the biliary drain  through the side of the metal stent.  A Cook 10-French internal/external biliary drain was then modified with approximately 10 cm of additional side holes and advanced into the duodenum.  The distal Cope loop was formed within the duodenum and the additional modified side holes extends to provide a full coverage of the left biliary tree.  Following placement of the tube, there is rapid decompression of the entire left biliary tree.  The catheter was secured to the skin with O Prolene suture and an adhesive fixation device.  There is no immediate complication, the patient tolerated the procedure well.  IMPRESSION:  1.  Percutaneous transhepatic cholangiogram demonstrates persistent significant obstruction of the left biliary tree.  However, there is a thin thread of recanalized duct passing through the malignant obstructing mass.  Of note - the metallic biliary stent is widely patent and there is free passage of contrast through the stent into the duodenum.  2.  Cholangioplasty of the malignant stricture and metal stent interstices to facilitate tube internalization.  3.  Placement of an internal/external 10-French transhepatic biliary drain modified with additional side holes.  Plan:  The patient will maintain the new tube to gravity drainage for 24 hours after which the tube can be  capped at home.  I will send the patient home with an extra bag.  If she experiences any problems (nausea/vomiting, fevers, increasing abdominal pain) to suggest tube dysfunction or recurrent obstruction, she should immediately connect the tube to the bag for externalized drainage and call interventional radiology.  This was discussed with the patient and her family at length.  Recommend follow-up complete metabolic panel with bilirubin and 7- 10 days.  The patient should also return to interventional radiology in 6 - 8 weeks for routine tube check and change.  Signed,  Sterling Big, MD Vascular & Interventional Radiologist  [facility]   Original Report Authenticated By: Malachy Moan, M.D.   Ir Biliary Dilitation  05/01/2013   *RADIOLOGY REPORT*  CHOLANGIOGRAM VIA EXISTING CATHETER, BILIARY DILATION, IR CATHETER TUBE CHANGE  Date: 04/30/2013  Clinical History: 57 year old female with colon cancer metastatic to the liver resulting in central obstruction of the left intrahepatic biliary ductal system and obstructive jaundice.  Her anatomy is atypical secondary to underlying scoliosis.  She has a metal stent which extends from the right common intrahepatic biliary duct into the common bile duct.  She has an existing left-sided externalized percutaneous biliary drain. Internalization was not possible previously secondary to complete obstruction by the malignant mass.  She has now undergone palliative radiation therapy.  A second attempt at internalization is planned. The  Procedures Performed:  1. Percutaneous through the sheath cholangiogram in multiple obliquities. 2.  Successful navigation of malignant stricture and internalization into the duodenum 3.  Cholangioplasty of malignant stricture and balloon disruption of stent interstices 4.  Placement of an internal/external 10-French biliary stent  Interventional Radiologist:  Sterling Big, MD  Sedation: Moderate (conscious) sedation was used.  Four mg Versed, 200 mcg Fentanyl were administered intravenously.  The patient's vital signs were monitored continuously by radiology nursing throughout the procedure.  Sedation Time: 50 minutes  Fluoroscopy time: 17 minutes 6 seconds  Contrast volume: 30 ml Omnipaque-300  Antibiotic Prophylaxis:  3.375 grams was administered within 1 hour of procedure  PROCEDURE/FINDINGS:   Informed consent was obtained from the patient following explanation of the procedure, risks, benefits and alternatives. The patient understands, agrees and consents for the procedure. All questions were addressed. A time out was performed.  Maximal  barrier sterile technique utilized including caps, mask, sterile gowns, sterile gloves, large sterile drape, hand hygiene, and betadine skin prep.  Local anesthesia was attained by infiltration of 1% lidocaine around the tube insertion site.  The retaining suture was cut.  The tube was transected removed over a short Amplatz wire.  An 8-French vascular sheath was advanced over the wire and positioned within the left intrahepatic biliary ductal tree.  A through the sheath cholangiogram was then performed in multiple obliquities. Initially, it appeared that the left biliary tree remained completely obstructed.  However, upon maximal distension of the biliary tree (the patient experienced significant discomfort) a thin of the right of contrast material could be seen heading toward the metal biliary stent and central ducts.  This was interrogated in multiple obliquities into the true pad is identified.  Using an angled catheter and a glide wire, the tract was successfully navigated.  The wire was passed through the interstices of the biliary stent and into the common bile duct and thereafter into the duodenum.  The glide wire was exchanged for an Amplatz wire.  The malignant biliary stricture in the inter studies of this stents were angioplastied to 4 mm using a 4  x 20 mm Mustang balloon.  This was necessary to facilitate passage of the biliary drain through the side of the metal stent.  A Cook 10-French internal/external biliary drain was then modified with approximately 10 cm of additional side holes and advanced into the duodenum.  The distal Cope loop was formed within the duodenum and the additional modified side holes extends to provide a full coverage of the left biliary tree.  Following placement of the tube, there is rapid decompression of the entire left biliary tree.  The catheter was secured to the skin with O Prolene suture and an adhesive fixation device.  There is no immediate complication, the patient  tolerated the procedure well.  IMPRESSION:  1.  Percutaneous transhepatic cholangiogram demonstrates persistent significant obstruction of the left biliary tree.  However, there is a thin thread of recanalized duct passing through the malignant obstructing mass.  Of note - the metallic biliary stent is widely patent and there is free passage of contrast through the stent into the duodenum.  2.  Cholangioplasty of the malignant stricture and metal stent interstices to facilitate tube internalization.  3.  Placement of an internal/external 10-French transhepatic biliary drain modified with additional side holes.  Plan:  The patient will maintain the new tube to gravity drainage for 24 hours after which the tube can be capped at home.  I will send the patient home with an extra bag.  If she experiences any problems (nausea/vomiting, fevers, increasing abdominal pain) to suggest tube dysfunction or recurrent obstruction, she should immediately connect the tube to the bag for externalized drainage and call interventional radiology.  This was discussed with the patient and her family at length.  Recommend follow-up complete metabolic panel with bilirubin and 7- 10 days.  The patient should also return to interventional radiology in 6 - 8 weeks for routine tube check and change.  Signed,  Sterling Big, MD Vascular & Interventional Radiologist [facility] Radiology   Original Report Authenticated By: Malachy Moan, M.D.   Ir Catheter Tube Change  05/01/2013   *RADIOLOGY REPORT*  CHOLANGIOGRAM VIA EXISTING CATHETER, BILIARY DILATION, IR CATHETER TUBE CHANGE  Date: 04/30/2013  Clinical History: 57 year old female with colon cancer metastatic to the liver resulting in central obstruction of the left intrahepatic biliary ductal system and obstructive jaundice.  Her anatomy is atypical secondary to underlying scoliosis.  She has a metal stent which extends from the right common intrahepatic biliary duct into the  common bile duct.  She has an existing left-sided externalized percutaneous biliary drain. Internalization was not possible previously secondary to complete obstruction by the malignant mass.  She has now undergone palliative radiation therapy.  A second attempt at internalization is planned. The  Procedures Performed:  1. Percutaneous through the sheath cholangiogram in multiple obliquities. 2.  Successful navigation of malignant stricture and internalization into the duodenum 3.  Cholangioplasty of malignant stricture and balloon disruption of stent interstices 4.  Placement of an internal/external 10-French biliary stent  Interventional Radiologist:  Sterling Big, MD  Sedation: Moderate (conscious) sedation was used.  Four mg Versed, 200 mcg Fentanyl were administered intravenously.  The patient's vital signs were monitored continuously by radiology nursing throughout the procedure.  Sedation Time: 50 minutes  Fluoroscopy time: 17 minutes 6 seconds  Contrast volume: 30 ml Omnipaque-300  Antibiotic Prophylaxis:  3.375 grams was administered within 1 hour of procedure  PROCEDURE/FINDINGS:   Informed consent was obtained from the patient following explanation of the procedure, risks, benefits  and alternatives. The patient understands, agrees and consents for the procedure. All questions were addressed. A time out was performed.  Maximal barrier sterile technique utilized including caps, mask, sterile gowns, sterile gloves, large sterile drape, hand hygiene, and betadine skin prep.  Local anesthesia was attained by infiltration of 1% lidocaine around the tube insertion site.  The retaining suture was cut.  The tube was transected removed over a short Amplatz wire.  An 8-French vascular sheath was advanced over the wire and positioned within the left intrahepatic biliary ductal tree.  A through the sheath cholangiogram was then performed in multiple obliquities. Initially, it appeared that the left biliary tree  remained completely obstructed.  However, upon maximal distension of the biliary tree (the patient experienced significant discomfort) a thin of the right of contrast material could be seen heading toward the metal biliary stent and central ducts.  This was interrogated in multiple obliquities into the true pad is identified.  Using an angled catheter and a glide wire, the tract was successfully navigated.  The wire was passed through the interstices of the biliary stent and into the common bile duct and thereafter into the duodenum.  The glide wire was exchanged for an Amplatz wire.  The malignant biliary stricture in the inter studies of this stents were angioplastied to 4 mm using a 4 x 20 mm Mustang balloon.  This was necessary to facilitate passage of the biliary drain through the side of the metal stent.  A Cook 10-French internal/external biliary drain was then modified with approximately 10 cm of additional side holes and advanced into the duodenum.  The distal Cope loop was formed within the duodenum and the additional modified side holes extends to provide a full coverage of the left biliary tree.  Following placement of the tube, there is rapid decompression of the entire left biliary tree.  The catheter was secured to the skin with O Prolene suture and an adhesive fixation device.  There is no immediate complication, the patient tolerated the procedure well.  IMPRESSION:  1.  Percutaneous transhepatic cholangiogram demonstrates persistent significant obstruction of the left biliary tree.  However, there is a thin thread of recanalized duct passing through the malignant obstructing mass.  Of note - the metallic biliary stent is widely patent and there is free passage of contrast through the stent into the duodenum.  2.  Cholangioplasty of the malignant stricture and metal stent interstices to facilitate tube internalization.  3.  Placement of an internal/external 10-French transhepatic biliary drain  modified with additional side holes.  Plan:  The patient will maintain the new tube to gravity drainage for 24 hours after which the tube can be capped at home.  I will send the patient home with an extra bag.  If she experiences any problems (nausea/vomiting, fevers, increasing abdominal pain) to suggest tube dysfunction or recurrent obstruction, she should immediately connect the tube to the bag for externalized drainage and call interventional radiology.  This was discussed with the patient and her family at length.  Recommend follow-up complete metabolic panel with bilirubin and 7- 10 days.  The patient should also return to interventional radiology in 6 - 8 weeks for routine tube check and change.  Signed,  Sterling Big, MD Vascular & Interventional Radiologist [facility] Radiology   Original Report Authenticated By: Malachy Moan, M.D.    Medications: I have reviewed the patient's current medications.  Assessment/Plan:  1. 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 She completed 12 cycles of FOLFOX chemotherapy. Oxaliplatin was held with cycles number 5, 8, 11, and 12. 2. Metastatic colon cancer confirmed on a restaging CT evaluation 05/02/2011 with liver metastases and periportal lymphadenopathy. She began treatment with FOLFIRI on 06/01/2011. Avastin was added beginning with cycle number 2. She completed cycle #6 08/08/2011. Restaging CT evaluation 08/15/2011 showed improvement in the liver metastases and porta hepatis lymphadenopathy. She completed cycle #12 FOLFIRI/Avastin 11/07/2011. Restaging CT evaluation 11/24/2011 showed decrease in size of hepatic metastases and no evidence of disease progression or new metastasis. She completed cycle 18 on 03/11/2012. A restaging CT 03/29/2012 revealed stable disease. Irinotecan was discontinued and treatment continued with  5-FU/leucovorin and Avastin on a 3 week schedule beginning 04/02/2012. -restaging CT 08/15/2012 with slight enlargement of the central liver lesion and progression of intrahepatic biliary dilatation, no other liver lesions and no other evidence of progressive metastatic disease. CEA stable on 08/20/2012. Treatment continued with 5-FU and Avastin on a 3 week schedule.restaging CT 09/30/2012 found the dominant central right liver metastasis to be slightly larger with progressive intrahepatic biliary dilatation, a previously described left hepatic lobe lesion appeared smaller measuring 10 mm and appeared separate from the liver and is felt to most likely represent a porta hepatis lymph node.A. Suspected 9 mm metastasis was noted in the medial left hepatic lobe on an MRI 10/10/2012 -status post stereotactic radiosurgeryto the dominant liver metastasis in 3 fractions February 10 through 10/28/2012. 5-FU/Avastin was resumed on a 3 week schedule 12/03/2012. CEA in normal range on 12/03/2012 and 01/14/2013. Restaging MRI of the liver 02/21/2013 with a decrease in the dominant right hepatic lesion, stable medial left hepatic lobe lesion-but the left lesion is now causing obstruction of the left hepatic bile ducts. 3. Status post Port-A-Cath placement and Port-A-Cath removal by Dr. Michaell Cowing. A new Port-A-Cath was placed on 05/16/2011. The Port-A-Cath was removed due to malposition. A new Port-A-Cath was placed on 05/30/2011. 4. Spina bifida with severe scoliosis.  5. Chronic bilateral foot drop/lower leg and foot numbness. 6. Hypothyroidism. 7. History of chemotherapy-induced diarrhea. The bolus and infusional 5-FU were reduced by 25% beginning with cycle number 2 of FOLFOX. The 5-FU dose was decreased by an additional 10% beginning with cycle number 8. 8. History of delayed nausea following chemotherapy. 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 The oxaliplatin was dose reduced by 20% beginning with  cycle number 6. The neuropathy symptoms resolved. 10. History of neutropenia secondary to chemotherapy. 11. Status post a colonoscopy 05/05/2010. There was a previous right colectomy anastomosis and slight nodularity. Biopsies were taken which showed benign colonic mucosa with hyperplastic epithelial changes. No significant inflammation, granuloma, or adenomatous epithelium was identified. 12. Diarrhea following FOLFIRI chemotherapy. Overall the diarrhea is controlled with Lomotil.  13. History of a fungal rash in the groin, improved with Diflucan and nystatin powder. 14. Mouth ulcers, oral candidiasis following cycle #6 of FOLFIRI-the ulcers resolved following treatment with Diflucan. She continues to note mild mouth ulcers following each treatment. She developed mucositis during infusional 5-fluorouracil/concurrent radiation given beginning on 04/07/2013. 15. Nausea during chemotherapy-Zofran was added to the chemotherapy regimen beginning on 07/09/2012. 16. Oozing at the gumline secondary to periodontal disease and Avastin 17. Jaundice secondary to biliary obstruction status post placement of a metallic biliary stent on 02/27/2013 without improvement in LFTs. Status post placement of a percutaneous biliary drain by interventional radiology  on 03/13/2013. She underwent a cholangiogram on 03/21/2013 with evidence of a high-grade obstruction at the level of the previously placed metallic biliary stent. The catheter could not be advanced. The external drainage catheter was upsized. Palliative radiation and infusional 5-FU given 04/07/2013 through 04/24/2013 in an attempt to shrink the liver metastasis so the biliary catheters can be advanced. Internalization of biliary drain on 04/30/2013; new external biliary drain in place 04/30/2013. 18. Mucositis secondary chemotherapy. Resolved.  Disposition-the bilirubin remains elevated despite internalization of the biliary drain on 04/30/2013. We instructed Ms.  Spence to open the drain and apply a collection bag. We made a referral to interventional radiology for followup. We will obtain a followup bilirubin on 05/29/2013.  Dr. Truett Perna recommends continued holding of chemotherapy pending improvement in the bilirubin.  She will return for a followup visit on 06/18/2013. She will contact the office in the interim with increased pain, nausea, fever. She was also instructed to contact the office if there is no output from the biliary drain once the collection bag is attached.  Patient seen with Dr. Truett Perna.    Lonna Cobb ANP/GNP-BC  This was a sheared visit with Lonna Cobb. The jaundice persists despite internalization of the biliary drain. She will placed to drain to external drainage today and we will refer her to interventional radiology. Systemic chemotherapy will remain on hold until the bilirubin improves.We will check a bilirubin level when she returns to see Dr. Mitzi Hansen next week.   Mancel Bale, M.D.

## 2013-05-22 NOTE — Telephone Encounter (Signed)
Gave pt appts for September andf October 2014

## 2013-05-23 ENCOUNTER — Ambulatory Visit (HOSPITAL_COMMUNITY)
Admission: RE | Admit: 2013-05-23 | Discharge: 2013-05-23 | Disposition: A | Discharge status: Home / Self Care | Payer: Medicare HMO | Source: Ambulatory Visit | Attending: Interventional Radiology | Admitting: Interventional Radiology

## 2013-05-23 DIAGNOSIS — K831 Obstruction of bile duct: Secondary | ICD-10-CM

## 2013-05-23 NOTE — Progress Notes (Signed)
Patient came in with complain of drainage at the insertion site of the biliary catheter with the catheter unable to be flushed.  The patient has a internal/external drain that was capped and draining internally placed on 04/30/2013.  It has been working fine until yesterday when it was draining a lot from the insertion site.  Dr Truett Perna suggested they connect it to the drainage bag that was provided to them at the time of placement by IR.  The patients husband attached the drainage bag when they arrived home but it would not drain due to the occlusive cap that had not been removed.  The cap was removed today by Interventional Radiology and it was placed to a drainage bag.  The patient and her husband understand they can recap the catheter when the bile clears.  They are to continue the daily flushes.  If they experience any further problems with draining around the catheter insertion site, they will call us.

## 2013-05-28 ENCOUNTER — Encounter: Payer: Self-pay | Admitting: Oncology

## 2013-05-29 ENCOUNTER — Ambulatory Visit (HOSPITAL_BASED_OUTPATIENT_CLINIC_OR_DEPARTMENT_OTHER): Payer: Medicare HMO

## 2013-05-29 ENCOUNTER — Ambulatory Visit
Admission: RE | Admit: 2013-05-29 | Discharge: 2013-05-29 | Disposition: A | Discharge status: Home / Self Care | Payer: Medicare HMO | Source: Ambulatory Visit | Attending: Radiation Oncology | Admitting: Radiation Oncology

## 2013-05-29 ENCOUNTER — Other Ambulatory Visit (HOSPITAL_COMMUNITY): Payer: Self-pay | Admitting: Interventional Radiology

## 2013-05-29 VITALS — BP 120/68 | HR 80 | Temp 98.2°F

## 2013-05-29 VITALS — BP 110/67 | HR 67 | Temp 97.0°F

## 2013-05-29 DIAGNOSIS — C787 Secondary malignant neoplasm of liver and intrahepatic bile duct: Secondary | ICD-10-CM

## 2013-05-29 DIAGNOSIS — Z452 Encounter for adjustment and management of vascular access device: Secondary | ICD-10-CM

## 2013-05-29 DIAGNOSIS — C189 Malignant neoplasm of colon, unspecified: Secondary | ICD-10-CM

## 2013-05-29 MED ORDER — SODIUM CHLORIDE 0.9 % IJ SOLN
10.0000 mL | INTRAMUSCULAR | Status: DC | PRN
  Administered 2013-05-29: 10 mL via INTRAVENOUS
  Filled 2013-05-29: qty 10

## 2013-05-29 MED ORDER — HEPARIN SOD (PORK) LOCK FLUSH 100 UNIT/ML IV SOLN
500.0000 [IU] | Freq: Once | INTRAVENOUS | Status: AC
  Administered 2013-05-29: 500 [IU] via INTRAVENOUS
  Filled 2013-05-29: qty 5

## 2013-05-29 NOTE — Patient Instructions (Addendum)
Implanted Port Instructions  An implanted port is a central line that has a round shape and is placed under the skin. It is used for long-term IV (intravenous) access for:  · Medicine.  · Fluids.  · Liquid nutrition, such as TPN (total parenteral nutrition).  · Blood samples.  Ports can be placed:  · In the chest area just below the collarbone (this is the most common place.)  · In the arms.  · In the belly (abdomen) area.  · In the legs.  PARTS OF THE PORT  A port has 2 main parts:  · The reservoir. The reservoir is round, disc-shaped, and will be a small, raised area under your skin.  · The reservoir is the part where a needle is inserted (accessed) to either give medicines or to draw blood.  · The catheter. The catheter is a long, slender tube that extends from the reservoir. The catheter is placed into a large vein.  · Medicine that is inserted into the reservoir goes into the catheter and then into the vein.  INSERTION OF THE PORT  · The port is surgically placed in either an operating room or in a procedural area (interventional radiology).  · Medicine may be given to help you relax during the procedure.  · The skin where the port will be inserted is numbed (local anesthetic).  · 1 or 2 small cuts (incisions) will be made in the skin to insert the port.  · The port can be used after it has been inserted.  INCISION SITE CARE  · The incision site may have small adhesive strips on it. This helps keep the incision site closed. Sometimes, no adhesive strips are placed. Instead of adhesive strips, a special kind of surgical glue is used to keep the incision closed.  · If adhesive strips were placed on the incision sites, do not take them off. They will fall off on their own.  · The incision site may be sore for 1 to 2 days. Pain medicine can help.  · Do not get the incision site wet. Bathe or shower as directed by your caregiver.  · The incision site should heal in 5 to 7 days. A small scar may form after the  incision has healed.  ACCESSING THE PORT  Special steps must be taken to access the port:  · Before the port is accessed, a numbing cream can be placed on the skin. This helps numb the skin over the port site.  · A sterile technique is used to access the port.  · The port is accessed with a needle. Only "non-coring" port needles should be used to access the port. Once the port is accessed, a blood return should be checked. This helps ensure the port is in the vein and is not clogged (clotted).  · If your caregiver believes your port should remain accessed, a clear (transparent) bandage will be placed over the needle site. The bandage and needle will need to be changed every week or as directed by your caregiver.  · Keep the bandage covering the needle clean and dry. Do not get it wet. Follow your caregiver's instructions on how to take a shower or bath when the port is accessed.  · If your port does not need to stay accessed, no bandage is needed over the port.  FLUSHING THE PORT  Flushing the port keeps it from getting clogged. How often the port is flushed depends on:  · If a   constant infusion is running. If a constant infusion is running, the port may not need to be flushed.  · If intermittent medicines are given.  · If the port is not being used.  For intermittent medicines:  · The port will need to be flushed:  · After medicines have been given.  · After blood has been drawn.  · As part of routine maintenance.  · A port is normally flushed with:  · Normal saline.  · Heparin.  · Follow your caregiver's advice on how often, how much, and the type of flush to use on your port.  IMPORTANT PORT INFORMATION  · Tell your caregiver if you are allergic to heparin.  · After your port is placed, you will get a manufacturer's information card. The card has information about your port. Keep this card with you at all times.  · There are many types of ports available. Know what kind of port you have.  · In case of an  emergency, it may be helpful to wear a medical alert bracelet. This can help alert health care workers that you have a port.  · The port can stay in for as long as your caregiver believes it is necessary.  · When it is time for the port to come out, surgery will be done to remove it. The surgery will be similar to how the port was put in.  · If you are in the hospital or clinic:  · Your port will be taken care of and flushed by a nurse.  · If you are at home:  · A home health care nurse may give medicines and take care of the port.  · You or a family member can get special training and directions for giving medicine and taking care of the port at home.  SEEK IMMEDIATE MEDICAL CARE IF:   · Your port does not flush or you are unable to get a blood return.  · New drainage or pus is coming from the incision.  · A bad smell is coming from the incision site.  · You develop swelling or increased redness at the incision site.  · You develop increased swelling or pain at the port site.  · You develop swelling or pain in the surrounding skin near the port.  · You have an oral temperature above 102° F (38.9° C), not controlled by medicine.  MAKE SURE YOU:   · Understand these instructions.  · Will watch your condition.  · Will get help right away if you are not doing well or get worse.  Document Released: 08/28/2005 Document Revised: 11/20/2011 Document Reviewed: 11/19/2008  [employer]® Patient Information ©2014 [employer].

## 2013-05-29 NOTE — Progress Notes (Signed)
Radiation Oncology         [phone]) [phone] ________________________________  Name: April Spence MRN: [id number]  Date: 05/29/2013  DOB: 1956/07/04  Follow-Up Visit Note  CC: Nadean Corwin, MD  Ladene Artist, MD  Diagnosis:   Metastatic colon cancer with liver metastasis  Interval Since Last Radiation:  One month   Narrative:  The patient returns today for routine follow-up.  The patient states she is doing well overall. She is very pleased that internalization of the biliary catheter was completed successfully. This was one of the major goals of our treatment in terms of radiation to try to shrink the tumor and facilitate this process. She has had some blockage of this in recent days but this appears to have improved. The patient notes some ongoing moderate abdominal pain. No new areas of pain or worsening severe pain. She does have some occasional nausea but she states that her current nausea medications worked well for this.                              ALLERGIES:  is allergic to adhesive.  Meds: Current Outpatient Prescriptions  Medication Sig Dispense Refill  . ALPRAZolam (XANAX) 0.25 MG tablet Take 0.25 mg by mouth 2 (two) times daily as needed for sleep or anxiety.      . Alum & Mag Hydroxide-Simeth (MAGIC MOUTHWASH) SOLN Take 10 mL by mouth 4 (four) times daily as needed. Mouthsores      . amitriptyline (ELAVIL) 25 MG tablet Take 25 mg by mouth at bedtime.       . chlorhexidine (PERIDEX) 0.12 % solution Use as directed 15 mLs in the mouth or throat 2 (two) times daily.       . Cholecalciferol (VITAMIN D-3) 5000 UNITS TABS Take 5,000 mg by mouth 2 (two) times daily.      . diphenoxylate-atropine (LOMOTIL) 2.5-0.025 MG per tablet Take 1 tablet by mouth 4 (four) times daily as needed for diarrhea or loose stools.      . fenofibrate micronized (LOFIBRA) 134 MG capsule Take 134 mg by mouth every evening.       Marland Kitchen FLUoxetine (PROZAC) 20 MG capsule Take 20 mg by mouth every  morning.       Marland Kitchen levothyroxine (SYNTHROID, LEVOTHROID) 150 MCG tablet Take 150 mcg by mouth daily before breakfast.      . lidocaine-prilocaine (EMLA) cream Apply 1 application topically as needed (use on port).      . LORazepam (ATIVAN) 0.5 MG tablet Take 1 tablet (0.5 mg total) by mouth every 12 (twelve) hours as needed for anxiety.  30 tablet  2  . nystatin (NYSTOP) 100000 UNIT/GM POWD Apply 1 g topically daily as needed (for rash). In summer on irritated areas      . ondansetron (ZOFRAN) 8 MG tablet Take 1 tablet (8 mg total) by mouth every 12 (twelve) hours as needed for nausea.  20 tablet  2  . oxyCODONE-acetaminophen (PERCOCET/ROXICET) 5-325 MG per tablet Take 1 tablet by mouth every 4 (four) hours as needed for pain.      Marland Kitchen PRESCRIPTION MEDICATION Pt gets chemotherapy at [facility]. Last treatment was on 02-11-13. Pt missed last treatment due to endo procedure. Followed by Dr Truett Perna      . prochlorperazine (COMPAZINE) 10 MG tablet Take 10 mg by mouth every 6 (six) hours as needed (nausea).       . propranolol (INDERAL) 80 MG  tablet Take 80 mg by mouth every morning.       . traMADol-acetaminophen (ULTRACET) 37.5-325 MG per tablet Take 1 tablet by mouth every 6 (six) hours as needed for pain.        No current facility-administered medications for this encounter.   Facility-Administered Medications Ordered in Other Encounters  Medication Dose Route Frequency Provider Last Rate Last Dose  . sodium chloride 0.9 % injection 10 mL  10 mL Intravenous PRN Ladene Artist, MD   10 mL at 05/29/13 1503    Physical Findings: The patient is in no acute distress. Patient is alert and oriented.  temperature is 98.2 F (36.8 C). Her blood pressure is 120/68 and her pulse is 80. Her oxygen saturation is 97%. .     Lab Findings: Lab Results  Component Value Date   WBC 7.8 05/16/2013   HGB 12.0 05/16/2013   HCT 35.4 05/16/2013   MCV 96.3 05/16/2013   PLT 213 05/16/2013     Radiographic Findings: Ir Cholan  Exist Tube  05/01/2013   *RADIOLOGY REPORT*  CHOLANGIOGRAM VIA EXISTING CATHETER, BILIARY DILATION, IR CATHETER TUBE CHANGE  Date: 04/30/2013  Clinical History: 57 year old female with colon cancer metastatic to the liver resulting in central obstruction of the left intrahepatic biliary ductal system and obstructive jaundice.  Her anatomy is atypical secondary to underlying scoliosis.  She has a metal stent which extends from the right common intrahepatic biliary duct into the common bile duct.  She has an existing left-sided externalized percutaneous biliary drain. Internalization was not possible previously secondary to complete obstruction by the malignant mass.  She has now undergone palliative radiation therapy.  A second attempt at internalization is planned. The  Procedures Performed:  1. Percutaneous through the sheath cholangiogram in multiple obliquities. 2.  Successful navigation of malignant stricture and internalization into the duodenum 3.  Cholangioplasty of malignant stricture and balloon disruption of stent interstices 4.  Placement of an internal/external 10-French biliary stent  Interventional Radiologist:  Sterling Big, MD  Sedation: Moderate (conscious) sedation was used.  Four mg Versed, 200 mcg Fentanyl were administered intravenously.  The patient's vital signs were monitored continuously by radiology nursing throughout the procedure.  Sedation Time: 50 minutes  Fluoroscopy time: 17 minutes 6 seconds  Contrast volume: 30 ml Omnipaque-300  Antibiotic Prophylaxis:  3.375 grams was administered within 1 hour of procedure  PROCEDURE/FINDINGS:   Informed consent was obtained from the patient following explanation of the procedure, risks, benefits and alternatives. The patient understands, agrees and consents for the procedure. All questions were addressed. A time out was performed.  Maximal barrier sterile technique utilized including caps, mask, sterile gowns, sterile gloves, large  sterile drape, hand hygiene, and betadine skin prep.  Local anesthesia was attained by infiltration of 1% lidocaine around the tube insertion site.  The retaining suture was cut.  The tube was transected removed over a short Amplatz wire.  An 8-French vascular sheath was advanced over the wire and positioned within the left intrahepatic biliary ductal tree.  A through the sheath cholangiogram was then performed in multiple obliquities. Initially, it appeared that the left biliary tree remained completely obstructed.  However, upon maximal distension of the biliary tree (the patient experienced significant discomfort) a thin of the right of contrast material could be seen heading toward the metal biliary stent and central ducts.  This was interrogated in multiple obliquities into the true pad is identified.  Using an angled catheter and a glide  wire, the tract was successfully navigated.  The wire was passed through the interstices of the biliary stent and into the common bile duct and thereafter into the duodenum.  The glide wire was exchanged for an Amplatz wire.  The malignant biliary stricture in the inter studies of this stents were angioplastied to 4 mm using a 4 x 20 mm Mustang balloon.  This was necessary to facilitate passage of the biliary drain through the side of the metal stent.  A Cook 10-French internal/external biliary drain was then modified with approximately 10 cm of additional side holes and advanced into the duodenum.  The distal Cope loop was formed within the duodenum and the additional modified side holes extends to provide a full coverage of the left biliary tree.  Following placement of the tube, there is rapid decompression of the entire left biliary tree.  The catheter was secured to the skin with O Prolene suture and an adhesive fixation device.  There is no immediate complication, the patient tolerated the procedure well.  IMPRESSION:  1.  Percutaneous transhepatic cholangiogram  demonstrates persistent significant obstruction of the left biliary tree.  However, there is a thin thread of recanalized duct passing through the malignant obstructing mass.  Of note - the metallic biliary stent is widely patent and there is free passage of contrast through the stent into the duodenum.  2.  Cholangioplasty of the malignant stricture and metal stent interstices to facilitate tube internalization.  3.  Placement of an internal/external 10-French transhepatic biliary drain modified with additional side holes.  Plan:  The patient will maintain the new tube to gravity drainage for 24 hours after which the tube can be capped at home.  I will send the patient home with an extra bag.  If she experiences any problems (nausea/vomiting, fevers, increasing abdominal pain) to suggest tube dysfunction or recurrent obstruction, she should immediately connect the tube to the bag for externalized drainage and call interventional radiology.  This was discussed with the patient and her family at length.  Recommend follow-up complete metabolic panel with bilirubin and 7- 10 days.  The patient should also return to interventional radiology in 6 - 8 weeks for routine tube check and change.  Signed,  Sterling Big, MD Vascular & Interventional Radiologist [facility] Radiology   Original Report Authenticated By: Malachy Moan, M.D.   Ir Biliary Dilitation  05/01/2013   *RADIOLOGY REPORT*  CHOLANGIOGRAM VIA EXISTING CATHETER, BILIARY DILATION, IR CATHETER TUBE CHANGE  Date: 04/30/2013  Clinical History: 57 year old female with colon cancer metastatic to the liver resulting in central obstruction of the left intrahepatic biliary ductal system and obstructive jaundice.  Her anatomy is atypical secondary to underlying scoliosis.  She has a metal stent which extends from the right common intrahepatic biliary duct into the common bile duct.  She has an existing left-sided externalized percutaneous biliary drain.  Internalization was not possible previously secondary to complete obstruction by the malignant mass.  She has now undergone palliative radiation therapy.  A second attempt at internalization is planned. The  Procedures Performed:  1. Percutaneous through the sheath cholangiogram in multiple obliquities. 2.  Successful navigation of malignant stricture and internalization into the duodenum 3.  Cholangioplasty of malignant stricture and balloon disruption of stent interstices 4.  Placement of an internal/external 10-French biliary stent  Interventional Radiologist:  Sterling Big, MD  Sedation: Moderate (conscious) sedation was used.  Four mg Versed, 200 mcg Fentanyl were administered intravenously.  The patient's vital signs  were monitored continuously by radiology nursing throughout the procedure.  Sedation Time: 50 minutes  Fluoroscopy time: 17 minutes 6 seconds  Contrast volume: 30 ml Omnipaque-300  Antibiotic Prophylaxis:  3.375 grams was administered within 1 hour of procedure  PROCEDURE/FINDINGS:   Informed consent was obtained from the patient following explanation of the procedure, risks, benefits and alternatives. The patient understands, agrees and consents for the procedure. All questions were addressed. A time out was performed.  Maximal barrier sterile technique utilized including caps, mask, sterile gowns, sterile gloves, large sterile drape, hand hygiene, and betadine skin prep.  Local anesthesia was attained by infiltration of 1% lidocaine around the tube insertion site.  The retaining suture was cut.  The tube was transected removed over a short Amplatz wire.  An 8-French vascular sheath was advanced over the wire and positioned within the left intrahepatic biliary ductal tree.  A through the sheath cholangiogram was then performed in multiple obliquities. Initially, it appeared that the left biliary tree remained completely obstructed.  However, upon maximal distension of the biliary tree (the  patient experienced significant discomfort) a thin of the right of contrast material could be seen heading toward the metal biliary stent and central ducts.  This was interrogated in multiple obliquities into the true pad is identified.  Using an angled catheter and a glide wire, the tract was successfully navigated.  The wire was passed through the interstices of the biliary stent and into the common bile duct and thereafter into the duodenum.  The glide wire was exchanged for an Amplatz wire.  The malignant biliary stricture in the inter studies of this stents were angioplastied to 4 mm using a 4 x 20 mm Mustang balloon.  This was necessary to facilitate passage of the biliary drain through the side of the metal stent.  A Cook 10-French internal/external biliary drain was then modified with approximately 10 cm of additional side holes and advanced into the duodenum.  The distal Cope loop was formed within the duodenum and the additional modified side holes extends to provide a full coverage of the left biliary tree.  Following placement of the tube, there is rapid decompression of the entire left biliary tree.  The catheter was secured to the skin with O Prolene suture and an adhesive fixation device.  There is no immediate complication, the patient tolerated the procedure well.  IMPRESSION:  1.  Percutaneous transhepatic cholangiogram demonstrates persistent significant obstruction of the left biliary tree.  However, there is a thin thread of recanalized duct passing through the malignant obstructing mass.  Of note - the metallic biliary stent is widely patent and there is free passage of contrast through the stent into the duodenum.  2.  Cholangioplasty of the malignant stricture and metal stent interstices to facilitate tube internalization.  3.  Placement of an internal/external 10-French transhepatic biliary drain modified with additional side holes.  Plan:  The patient will maintain the new tube to gravity  drainage for 24 hours after which the tube can be capped at home.  I will send the patient home with an extra bag.  If she experiences any problems (nausea/vomiting, fevers, increasing abdominal pain) to suggest tube dysfunction or recurrent obstruction, she should immediately connect the tube to the bag for externalized drainage and call interventional radiology.  This was discussed with the patient and her family at length.  Recommend follow-up complete metabolic panel with bilirubin and 7- 10 days.  The patient should also return to interventional radiology  in 6 - 8 weeks for routine tube check and change.  Signed,  Sterling Big, MD Vascular & Interventional Radiologist [facility] Radiology   Original Report Authenticated By: Malachy Moan, M.D.   Ir Catheter Tube Change  05/01/2013   *RADIOLOGY REPORT*  CHOLANGIOGRAM VIA EXISTING CATHETER, BILIARY DILATION, IR CATHETER TUBE CHANGE  Date: 04/30/2013  Clinical History: 57 year old female with colon cancer metastatic to the liver resulting in central obstruction of the left intrahepatic biliary ductal system and obstructive jaundice.  Her anatomy is atypical secondary to underlying scoliosis.  She has a metal stent which extends from the right common intrahepatic biliary duct into the common bile duct.  She has an existing left-sided externalized percutaneous biliary drain. Internalization was not possible previously secondary to complete obstruction by the malignant mass.  She has now undergone palliative radiation therapy.  A second attempt at internalization is planned. The  Procedures Performed:  1. Percutaneous through the sheath cholangiogram in multiple obliquities. 2.  Successful navigation of malignant stricture and internalization into the duodenum 3.  Cholangioplasty of malignant stricture and balloon disruption of stent interstices 4.  Placement of an internal/external 10-French biliary stent  Interventional Radiologist:  Sterling Big, MD  Sedation: Moderate (conscious) sedation was used.  Four mg Versed, 200 mcg Fentanyl were administered intravenously.  The patient's vital signs were monitored continuously by radiology nursing throughout the procedure.  Sedation Time: 50 minutes  Fluoroscopy time: 17 minutes 6 seconds  Contrast volume: 30 ml Omnipaque-300  Antibiotic Prophylaxis:  3.375 grams was administered within 1 hour of procedure  PROCEDURE/FINDINGS:   Informed consent was obtained from the patient following explanation of the procedure, risks, benefits and alternatives. The patient understands, agrees and consents for the procedure. All questions were addressed. A time out was performed.  Maximal barrier sterile technique utilized including caps, mask, sterile gowns, sterile gloves, large sterile drape, hand hygiene, and betadine skin prep.  Local anesthesia was attained by infiltration of 1% lidocaine around the tube insertion site.  The retaining suture was cut.  The tube was transected removed over a short Amplatz wire.  An 8-French vascular sheath was advanced over the wire and positioned within the left intrahepatic biliary ductal tree.  A through the sheath cholangiogram was then performed in multiple obliquities. Initially, it appeared that the left biliary tree remained completely obstructed.  However, upon maximal distension of the biliary tree (the patient experienced significant discomfort) a thin of the right of contrast material could be seen heading toward the metal biliary stent and central ducts.  This was interrogated in multiple obliquities into the true pad is identified.  Using an angled catheter and a glide wire, the tract was successfully navigated.  The wire was passed through the interstices of the biliary stent and into the common bile duct and thereafter into the duodenum.  The glide wire was exchanged for an Amplatz wire.  The malignant biliary stricture in the inter studies of this stents were  angioplastied to 4 mm using a 4 x 20 mm Mustang balloon.  This was necessary to facilitate passage of the biliary drain through the side of the metal stent.  A Cook 10-French internal/external biliary drain was then modified with approximately 10 cm of additional side holes and advanced into the duodenum.  The distal Cope loop was formed within the duodenum and the additional modified side holes extends to provide a full coverage of the left biliary tree.  Following placement of the tube, there  is rapid decompression of the entire left biliary tree.  The catheter was secured to the skin with O Prolene suture and an adhesive fixation device.  There is no immediate complication, the patient tolerated the procedure well.  IMPRESSION:  1.  Percutaneous transhepatic cholangiogram demonstrates persistent significant obstruction of the left biliary tree.  However, there is a thin thread of recanalized duct passing through the malignant obstructing mass.  Of note - the metallic biliary stent is widely patent and there is free passage of contrast through the stent into the duodenum.  2.  Cholangioplasty of the malignant stricture and metal stent interstices to facilitate tube internalization.  3.  Placement of an internal/external 10-French transhepatic biliary drain modified with additional side holes.  Plan:  The patient will maintain the new tube to gravity drainage for 24 hours after which the tube can be capped at home.  I will send the patient home with an extra bag.  If she experiences any problems (nausea/vomiting, fevers, increasing abdominal pain) to suggest tube dysfunction or recurrent obstruction, she should immediately connect the tube to the bag for externalized drainage and call interventional radiology.  This was discussed with the patient and her family at length.  Recommend follow-up complete metabolic panel with bilirubin and 7- 10 days.  The patient should also return to interventional radiology in 6 - 8  weeks for routine tube check and change.  Signed,  Sterling Big, MD Vascular & Interventional Radiologist [facility] Radiology   Original Report Authenticated By: Malachy Moan, M.D.    Impression/plan:    I please with how the patient did with her course of radiation treatment and successful procedure through interventional radiology in terms of internalization of her biliary catheter. No signs of acute toxicity from her radiation treatment at this point. She is followed by multiple physicians closely. We discussed possible followup options and decided to have her followup in our clinic on a when necessary basis.    Radene Gunning, M.D., Ph.D.

## 2013-05-29 NOTE — Progress Notes (Signed)
Patient for routine follow up completion of radiation to liver on 8/14/114.Moderate abdominal pain and nausea (dry heaves).take compazine and zofran.

## 2013-06-09 ENCOUNTER — Other Ambulatory Visit (HOSPITAL_COMMUNITY): Payer: Self-pay | Admitting: Interventional Radiology

## 2013-06-09 ENCOUNTER — Ambulatory Visit (HOSPITAL_COMMUNITY)
Admission: RE | Admit: 2013-06-09 | Discharge: 2013-06-09 | Disposition: A | Discharge status: Home / Self Care | Payer: Medicare HMO | Source: Ambulatory Visit | Attending: Interventional Radiology | Admitting: Interventional Radiology

## 2013-06-09 DIAGNOSIS — C787 Secondary malignant neoplasm of liver and intrahepatic bile duct: Secondary | ICD-10-CM

## 2013-06-09 DIAGNOSIS — K831 Obstruction of bile duct: Secondary | ICD-10-CM | POA: Insufficient documentation

## 2013-06-09 DIAGNOSIS — C801 Malignant (primary) neoplasm, unspecified: Secondary | ICD-10-CM | POA: Insufficient documentation

## 2013-06-09 DIAGNOSIS — C189 Malignant neoplasm of colon, unspecified: Secondary | ICD-10-CM | POA: Insufficient documentation

## 2013-06-09 MED ORDER — FENTANYL CITRATE 0.05 MG/ML IJ SOLN
INTRAMUSCULAR | Status: AC
  Administered 2013-06-09: 14:00:00
  Filled 2013-06-09: qty 2

## 2013-06-09 MED ORDER — PIPERACILLIN-TAZOBACTAM 3.375 G IVPB: 3.3750 g | Freq: Once | INTRAVENOUS | Status: DC

## 2013-06-09 MED ORDER — IOHEXOL 300 MG/ML  SOLN
10.0000 mL | Freq: Once | INTRAMUSCULAR | Status: AC | PRN
  Administered 2013-06-09: 10 mL

## 2013-06-09 MED ORDER — FENTANYL CITRATE 0.05 MG/ML IJ SOLN
INTRAMUSCULAR | Status: AC
  Administered 2013-06-09: 100 ug
  Filled 2013-06-09: qty 2

## 2013-06-09 MED ORDER — PIPERACILLIN-TAZOBACTAM 3.375 G IVPB 30 MIN
3.3750 g | Freq: Once | INTRAVENOUS | Status: AC
  Administered 2013-06-09: 3.375 g via INTRAVENOUS
  Filled 2013-06-09: qty 50

## 2013-06-09 NOTE — Procedures (Signed)
Successful left int ext biliary drain exchg which required 6mm pta No comp Stable Full report in PACS

## 2013-06-11 ENCOUNTER — Encounter (HOSPITAL_COMMUNITY): Payer: Self-pay | Admitting: Pharmacy Technician

## 2013-06-12 ENCOUNTER — Ambulatory Visit (HOSPITAL_COMMUNITY): Admission: RE | Admit: 2013-06-12 | Payer: Medicare HMO | Source: Ambulatory Visit

## 2013-06-12 ENCOUNTER — Inpatient Hospital Stay (HOSPITAL_COMMUNITY): Admission: RE | Admit: 2013-06-12 | Payer: Medicare HMO | Source: Ambulatory Visit

## 2013-06-18 ENCOUNTER — Telehealth: Payer: Self-pay | Admitting: Oncology

## 2013-06-18 ENCOUNTER — Other Ambulatory Visit (HOSPITAL_BASED_OUTPATIENT_CLINIC_OR_DEPARTMENT_OTHER): Payer: Medicare HMO | Admitting: Lab

## 2013-06-18 ENCOUNTER — Ambulatory Visit (HOSPITAL_BASED_OUTPATIENT_CLINIC_OR_DEPARTMENT_OTHER): Payer: Medicare HMO | Admitting: Oncology

## 2013-06-18 VITALS — BP 119/69 | HR 74 | Temp 97.0°F | Resp 20 | Ht 60.0 in | Wt 129.0 lb

## 2013-06-18 DIAGNOSIS — C189 Malignant neoplasm of colon, unspecified: Secondary | ICD-10-CM

## 2013-06-18 DIAGNOSIS — C787 Secondary malignant neoplasm of liver and intrahepatic bile duct: Secondary | ICD-10-CM

## 2013-06-18 DIAGNOSIS — C180 Malignant neoplasm of cecum: Secondary | ICD-10-CM

## 2013-06-18 DIAGNOSIS — R109 Unspecified abdominal pain: Secondary | ICD-10-CM

## 2013-06-18 DIAGNOSIS — R110 Nausea: Secondary | ICD-10-CM

## 2013-06-18 LAB — COMPREHENSIVE METABOLIC PANEL (CC13)
ALT: 23 U/L (ref 0–55)
Anion Gap: 6 mEq/L (ref 3–11)
CO2: 28 mEq/L (ref 22–29)
Calcium: 9.4 mg/dL (ref 8.4–10.4)
Chloride: 102 mEq/L (ref 98–109)
Potassium: 3.9 mEq/L (ref 3.5–5.1)
Sodium: 136 mEq/L (ref 136–145)
Total Protein: 7.2 g/dL (ref 6.4–8.3)

## 2013-06-18 MED ORDER — MORPHINE SULFATE ER 15 MG PO TBCR: 15.0000 mg | EXTENDED_RELEASE_TABLET | Freq: Two times a day (BID) | ORAL | Status: DC

## 2013-06-18 NOTE — Telephone Encounter (Signed)
appts made cal and avs given ct to sch and call pt who prefers [facility]

## 2013-06-18 NOTE — Progress Notes (Signed)
[facility]    OFFICE PROGRESS NOTE   INTERVAL HISTORY:   April Spence returns for scheduled followup of metastatic colon cancer. She underwent exchange of a clot biliary drain on 06/09/2013. She believes the jaundice has improved. She has noted increased pain in the mid and right upper abdomen for the past few weeks. There is associated nausea. The pain is partially relieved with oxycodone and tramadol. She is taking pain medication several times during the day and at night. Her appetite is diminished. She reports malaise. No dyspnea.  Objective:  Vital signs in last 24 hours:  There were no vitals taken for this visit.    HEENT: There is scleral and subungual icterus Resp: Decreased breath sounds with rhonchi at the lower posterior chest bilaterally, no respiratory distress Cardio: Regular rate and rhythm GI: ? Palpable liver edge in the right abdomen. Left upper abdomen drain site with a dressing in place Neuro: Bilateral leg weakness, she moves both legs  Skin: Mild jaundice   Portacath/PICC-without erythema or tenderness  Lab Results:  Chemistry panel pending   Medications: I have reviewed the patient's current medications.  Assessment/Plan: 1. 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 She completed 12 cycles of FOLFOX chemotherapy. Oxaliplatin was held with cycles number 5, 8, 11, and 12. 2. Metastatic colon cancer confirmed on a restaging CT evaluation 05/02/2011 with liver metastases and periportal lymphadenopathy. She began treatment with FOLFIRI on 06/01/2011. Avastin was added beginning with cycle number 2. She completed cycle #6 08/08/2011. Restaging CT evaluation 08/15/2011 showed improvement in the liver metastases and porta hepatis lymphadenopathy. She completed cycle #12 FOLFIRI/Avastin 11/07/2011. Restaging CT evaluation 11/24/2011  showed decrease in size of hepatic metastases and no evidence of disease progression or new metastasis. She completed cycle 18 on 03/11/2012. A restaging CT 03/29/2012 revealed stable disease. Irinotecan was discontinued and treatment continued with 5-FU/leucovorin and Avastin on a 3 week schedule beginning 04/02/2012. -restaging CT 08/15/2012 with slight enlargement of the central liver lesion and progression of intrahepatic biliary dilatation, no other liver lesions and no other evidence of progressive metastatic disease. CEA stable on 08/20/2012. Treatment continued with 5-FU and Avastin on a 3 week schedule.restaging CT 09/30/2012 found the dominant central right liver metastasis to be slightly larger with progressive intrahepatic biliary dilatation, a previously described left hepatic lobe lesion appeared smaller measuring 10 mm and appeared separate from the liver and is felt to most likely represent a porta hepatis lymph node.A. Suspected 9 mm metastasis was noted in the medial left hepatic lobe on an MRI 10/10/2012 -status post stereotactic radiosurgeryto the dominant liver metastasis in 3 fractions February 10 through 10/28/2012. 5-FU/Avastin was resumed on a 3 week schedule 12/03/2012. CEA in normal range on 12/03/2012 and 01/14/2013. Restaging MRI of the liver 02/21/2013 with a decrease in the dominant right hepatic lesion, stable medial left hepatic lobe lesion-but the left lesion is now causing obstruction of the left hepatic bile ducts. 3. Status post Port-A-Cath placement and Port-A-Cath removal by Dr. Michaell Cowing. A new Port-A-Cath was placed on 05/16/2011. The Port-A-Cath was removed due to malposition. A new Port-A-Cath was placed on 05/30/2011. 4. Spina bifida with severe scoliosis.  5. Chronic bilateral foot drop/lower leg and foot numbness. 6. Hypothyroidism. 7. History of chemotherapy-induced diarrhea. The bolus and infusional 5-FU were reduced by 25% beginning with cycle number 2 of FOLFOX.  The  5-FU dose was decreased by an additional 10% beginning with cycle number 8. 8. History of delayed nausea following chemotherapy. 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 The oxaliplatin was dose reduced by 20% beginning with cycle number 6. The neuropathy symptoms resolved. 10. History of neutropenia secondary to chemotherapy. 11. Status post a colonoscopy 05/05/2010. There was a previous right colectomy anastomosis and slight nodularity. Biopsies were taken which showed benign colonic mucosa with hyperplastic epithelial changes. No significant inflammation, granuloma, or adenomatous epithelium was identified. 12. Diarrhea following FOLFIRI chemotherapy. Overall the diarrhea is controlled with Lomotil.  13. History of a fungal rash in the groin, improved with Diflucan and nystatin powder. 14. Mouth ulcers, oral candidiasis following cycle #6 of FOLFIRI-the ulcers resolved following treatment with Diflucan. She continues to note mild mouth ulcers following each treatment. She developed mucositis during infusional 5-fluorouracil/concurrent radiation given beginning on 04/07/2013. 15. Nausea during chemotherapy-Zofran was added to the chemotherapy regimen beginning on 07/09/2012. 16. Oozing at the gumline secondary to periodontal disease and Avastin 17. Jaundice secondary to biliary obstruction status post placement of a metallic biliary stent on 02/27/2013 without improvement in LFTs. Status post placement of a percutaneous biliary drain by interventional radiology on 03/13/2013. She underwent a cholangiogram on 03/21/2013 with evidence of a high-grade obstruction at the level of the previously placed metallic biliary stent. The catheter could not be advanced. The external drainage catheter was upsized. Palliative radiation and infusional 5-FU given 04/07/2013 through 04/24/2013 in an attempt to shrink the liver metastasis so the biliary catheters can be advanced. Internalization of  biliary drain on 04/30/2013; new external biliary drain in place 04/30/2013. Conclusion of the internal/external drain confirmed on a cholangiogram 06/09/2013, status post balloon dilatation of the permanent biliary stent and exchange of the internal/external drain 18. Mucositis secondary chemotherapy. Resolved  Disposition:  April Spence has been maintained off of treatment for colon cancer since completing palliative radiation and infusional 5-fluorouracil on 04/24/2013. She has increased abdominal pain and nausea. I am concerned the symptoms may be related to progression of the liver metastases or abdominal lymphadenopathy. We will refer her for a restaging CT evaluation. She will return for an office visit in one week. We will consider systemic treatment options including CAPOX.  She has increased pain that has required frequent narcotic analgesics. We added MS Contin today.  She remains jaundice. We will followup on the chemistry panel from today.   Thornton Papas, MD  06/18/2013  10:39 AM

## 2013-06-19 ENCOUNTER — Other Ambulatory Visit: Payer: Self-pay

## 2013-06-19 DIAGNOSIS — Z1231 Encounter for screening mammogram for malignant neoplasm of breast: Secondary | ICD-10-CM

## 2013-06-23 ENCOUNTER — Ambulatory Visit (HOSPITAL_COMMUNITY)
Admission: RE | Admit: 2013-06-23 | Discharge: 2013-06-23 | Disposition: A | Discharge status: Home / Self Care | Payer: Medicare HMO | Source: Ambulatory Visit | Attending: Oncology | Admitting: Oncology

## 2013-06-23 ENCOUNTER — Encounter (HOSPITAL_COMMUNITY): Payer: Self-pay

## 2013-06-23 DIAGNOSIS — C189 Malignant neoplasm of colon, unspecified: Secondary | ICD-10-CM | POA: Insufficient documentation

## 2013-06-23 DIAGNOSIS — R112 Nausea with vomiting, unspecified: Secondary | ICD-10-CM | POA: Insufficient documentation

## 2013-06-23 DIAGNOSIS — K5900 Constipation, unspecified: Secondary | ICD-10-CM | POA: Insufficient documentation

## 2013-06-23 DIAGNOSIS — K7689 Other specified diseases of liver: Secondary | ICD-10-CM | POA: Insufficient documentation

## 2013-06-23 DIAGNOSIS — Z9221 Personal history of antineoplastic chemotherapy: Secondary | ICD-10-CM | POA: Insufficient documentation

## 2013-06-23 DIAGNOSIS — D259 Leiomyoma of uterus, unspecified: Secondary | ICD-10-CM | POA: Insufficient documentation

## 2013-06-23 DIAGNOSIS — M439 Deforming dorsopathy, unspecified: Secondary | ICD-10-CM | POA: Insufficient documentation

## 2013-06-23 DIAGNOSIS — Z923 Personal history of irradiation: Secondary | ICD-10-CM | POA: Insufficient documentation

## 2013-06-23 DIAGNOSIS — R1011 Right upper quadrant pain: Secondary | ICD-10-CM | POA: Insufficient documentation

## 2013-06-23 DIAGNOSIS — N8189 Other female genital prolapse: Secondary | ICD-10-CM | POA: Insufficient documentation

## 2013-06-23 MED ORDER — IOHEXOL 300 MG/ML  SOLN
50.0000 mL | Freq: Once | INTRAMUSCULAR | Status: AC | PRN
  Administered 2013-06-23: 50 mL via ORAL

## 2013-06-23 MED ORDER — IOHEXOL 300 MG/ML  SOLN
100.0000 mL | Freq: Once | INTRAMUSCULAR | Status: AC | PRN
  Administered 2013-06-23: 100 mL via INTRAVENOUS

## 2013-06-25 ENCOUNTER — Other Ambulatory Visit (HOSPITAL_BASED_OUTPATIENT_CLINIC_OR_DEPARTMENT_OTHER): Payer: Medicare HMO | Admitting: Lab

## 2013-06-25 ENCOUNTER — Ambulatory Visit (HOSPITAL_BASED_OUTPATIENT_CLINIC_OR_DEPARTMENT_OTHER): Payer: Medicare HMO | Admitting: Oncology

## 2013-06-25 ENCOUNTER — Telehealth: Payer: Self-pay | Admitting: Oncology

## 2013-06-25 VITALS — BP 116/73 | HR 88 | Temp 97.9°F | Resp 20 | Ht 60.0 in | Wt 128.9 lb

## 2013-06-25 DIAGNOSIS — C189 Malignant neoplasm of colon, unspecified: Secondary | ICD-10-CM

## 2013-06-25 DIAGNOSIS — C787 Secondary malignant neoplasm of liver and intrahepatic bile duct: Secondary | ICD-10-CM

## 2013-06-25 LAB — COMPREHENSIVE METABOLIC PANEL (CC13)
ALT: 16 U/L (ref 0–55)
AST: 38 U/L — ABNORMAL HIGH (ref 5–34)
Alkaline Phosphatase: 425 U/L — ABNORMAL HIGH (ref 40–150)
Anion Gap: 7 mEq/L (ref 3–11)
BUN: 4.9 mg/dL — ABNORMAL LOW (ref 7.0–26.0)
CO2: 26 mEq/L (ref 22–29)
Creatinine: 0.6 mg/dL (ref 0.6–1.1)
Sodium: 135 mEq/L — ABNORMAL LOW (ref 136–145)
Total Bilirubin: 2.48 mg/dL — ABNORMAL HIGH (ref 0.20–1.20)
Total Protein: 7.3 g/dL (ref 6.4–8.3)

## 2013-06-25 MED ORDER — OXYCODONE-ACETAMINOPHEN 5-325 MG PO TABS: 1.0000 | ORAL_TABLET | ORAL | Status: DC | PRN

## 2013-06-25 NOTE — Patient Instructions (Signed)
Increase your Colace to twice daily and start MiraLax 17 grams daily in 8 ounces of fluid for constipation.

## 2013-06-25 NOTE — Progress Notes (Signed)
[facility]    OFFICE PROGRESS NOTE   INTERVAL HISTORY:   April Spence returns as scheduled. She continues to have abdominal pain and back pain. The back pain is chronic, but she feels the current back pain could be related to her abdomen. MS Contin did not help the pain. Percocet helps and she is taking 2-3 Percocets per day in addition to tramadol. She is constipated. The biliary drain is flushing easily.  Objective:  Vital signs in last 24 hours:  Blood pressure 116/73, pulse 88, temperature 97.9 F (36.6 C), temperature source Oral, resp. rate 20, height 5' (1.524 m), weight 128 lb 14.4 oz (58.469 kg).    HEENT: Mild scleral icterus Resp: Decreased breath sounds with inspiratory rhonchi at the right posterior chest Cardio: Regular rate and rhythm GI: No hepatomegaly, left upper quadrant drain site with a goals dressing, nontender Vascular: No leg edema   Portacath/PICC-without erythema  Lab Results: Potassium 4.0, creatinine 0.6, bilirubin 2.48, alkaline phosphatase 425  X-rays: CT the abdomen and pelvis on 06/23/2013, compared to 03/11/2013-the previously noted right liver lesion is partially up secured by the biliary stent, but appears slightly smaller. No new focal liver lesion. Dilatation of the left intrahepatic biliary system has improved. No retroperitoneal or retrocrural adenopathy. Colonic stool burden suggests constipation. No evidence of omental or peritoneal disease.    Medications: I have reviewed the patient's current medications.  Assessment/Plan: 1. 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 She completed 12 cycles of FOLFOX chemotherapy. Oxaliplatin was held with cycles number 5, 8, 11, and 12. 2. Metastatic colon cancer confirmed on a restaging CT evaluation 05/02/2011 with liver metastases and periportal lymphadenopathy.  She began treatment with FOLFIRI on 06/01/2011. Avastin was added beginning with cycle number 2. She completed cycle #6 08/08/2011. Restaging CT evaluation 08/15/2011 showed improvement in the liver metastases and porta hepatis lymphadenopathy. She completed cycle #12 FOLFIRI/Avastin 11/07/2011. Restaging CT evaluation 11/24/2011 showed decrease in size of hepatic metastases and no evidence of disease progression or new metastasis. She completed cycle 18 on 03/11/2012. A restaging CT 03/29/2012 revealed stable disease. Irinotecan was discontinued and treatment continued with 5-FU/leucovorin and Avastin on a 3 week schedule beginning 04/02/2012. -restaging CT 08/15/2012 with slight enlargement of the central liver lesion and progression of intrahepatic biliary dilatation, no other liver lesions and no other evidence of progressive metastatic disease. CEA stable on 08/20/2012. Treatment continued with 5-FU and Avastin on a 3 week schedule.restaging CT 09/30/2012 found the dominant central right liver metastasis to be slightly larger with progressive intrahepatic biliary dilatation, a previously described left hepatic lobe lesion appeared smaller measuring 10 mm and appeared separate from the liver and is felt to most likely represent a porta hepatis lymph node.A. Suspected 9 mm metastasis was noted in the medial left hepatic lobe on an MRI 10/10/2012 -status post stereotactic radiosurgeryto the dominant liver metastasis in 3 fractions February 10 through 10/28/2012. 5-FU/Avastin was resumed on a 3 week schedule 12/03/2012. CEA in normal range on 12/03/2012 and 01/14/2013. Restaging MRI of the liver 02/21/2013 with a decrease in the dominant right hepatic lesion, stable medial left hepatic lobe lesion-but the left lesion is now causing obstruction of the left hepatic bile ducts.  CT 06/23/2013 with a decrease in the right liver lesion, relief of the obstructed left biliary system, and no evidence of progressive  metastatic disease 3. Status post Port-A-Cath placement and Port-A-Cath removal by Dr. Michaell Cowing. A new Port-A-Cath was placed on 05/16/2011. The Port-A-Cath was removed due to malposition. A new Port-A-Cath was placed on 05/30/2011. 4. Spina bifida with severe scoliosis.  5. Chronic bilateral foot drop/lower leg and foot numbness. 6. Hypothyroidism. 7. History of chemotherapy-induced diarrhea. The bolus and infusional 5-FU were reduced by 25% beginning with cycle number 2 of FOLFOX. The 5-FU dose was decreased by an additional 10% beginning with cycle number 8. 8. History of delayed nausea following chemotherapy. 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 The oxaliplatin was dose reduced by 20% beginning with cycle number 6. The neuropathy symptoms resolved. 10. History of neutropenia secondary to chemotherapy. 11. Status post a colonoscopy 05/05/2010. There was a previous right colectomy anastomosis and slight nodularity. Biopsies were taken which showed benign colonic mucosa with hyperplastic epithelial changes. No significant inflammation, granuloma, or adenomatous epithelium was identified. 12. Diarrhea following FOLFIRI chemotherapy. Overall the diarrhea is controlled with Lomotil.  13. History of a fungal rash in the groin, improved with Diflucan and nystatin powder. 14. Mouth ulcers, oral candidiasis following cycle #6 of FOLFIRI-the ulcers resolved following treatment with Diflucan. She continues to note mild mouth ulcers following each treatment. She developed mucositis during infusional 5-fluorouracil/concurrent radiation given beginning on 04/07/2013. 15. Nausea during chemotherapy-Zofran was added to the chemotherapy regimen beginning on 07/09/2012. 16. Oozing at the gumline secondary to periodontal disease and Avastin 17. Jaundice secondary to biliary obstruction status post placement of a metallic biliary stent on 02/27/2013 without improvement in LFTs. Status post  placement of a percutaneous biliary drain by interventional radiology on 03/13/2013. She underwent a cholangiogram on 03/21/2013 with evidence of a high-grade obstruction at the level of the previously placed metallic biliary stent. The catheter could not be advanced. The external drainage catheter was upsized. Palliative radiation and infusional 5-FU given 04/07/2013 through 04/24/2013 in an attempt to shrink the liver metastasis so the biliary catheters can be advanced. Internalization of biliary drain on 04/30/2013; new external biliary drain in place 06/09/2013. 18. Mucositis secondary chemotherapy. Resolved. 19. Abdominal/back pain-etiology unclear. The pain could be related to an unrecognized metastatic disease in the abdomen or retroperitoneum. I have a low clinical suspicion for metastatic disease involving the spine. It is possible the pain is in part related to constipation.  Disposition:  The jaundice has improved further since the new biliary drain was placed. She will continue Percocet and tramadol for pain. We added MiraLAX and increased the Colace dose. Ms. Mccarey will return for an office visit in 2 weeks. I do not recommend further systemic therapy unless there is clear evidence of disease progression. We will consider obtaining a PET scan if the pain persists.   Thornton Papas, MD  06/25/2013  12:22 PM

## 2013-06-25 NOTE — Telephone Encounter (Signed)
gv and printed appt sched and avs for pt for OCT. °

## 2013-06-30 ENCOUNTER — Ambulatory Visit: Payer: Medicare HMO

## 2013-07-08 ENCOUNTER — Other Ambulatory Visit (HOSPITAL_BASED_OUTPATIENT_CLINIC_OR_DEPARTMENT_OTHER): Payer: Medicare HMO | Admitting: Lab

## 2013-07-08 ENCOUNTER — Telehealth: Payer: Self-pay | Admitting: *Deleted

## 2013-07-08 ENCOUNTER — Ambulatory Visit (HOSPITAL_BASED_OUTPATIENT_CLINIC_OR_DEPARTMENT_OTHER): Payer: Medicare HMO | Admitting: Nurse Practitioner

## 2013-07-08 VITALS — BP 129/71 | HR 85 | Temp 98.0°F | Resp 19 | Ht 60.0 in | Wt 126.7 lb

## 2013-07-08 DIAGNOSIS — C787 Secondary malignant neoplasm of liver and intrahepatic bile duct: Secondary | ICD-10-CM

## 2013-07-08 DIAGNOSIS — R109 Unspecified abdominal pain: Secondary | ICD-10-CM

## 2013-07-08 DIAGNOSIS — C189 Malignant neoplasm of colon, unspecified: Secondary | ICD-10-CM

## 2013-07-08 DIAGNOSIS — C180 Malignant neoplasm of cecum: Secondary | ICD-10-CM

## 2013-07-08 LAB — COMPREHENSIVE METABOLIC PANEL (CC13)
ALT: 14 U/L (ref 0–55)
AST: 35 U/L — ABNORMAL HIGH (ref 5–34)
Albumin: 2.2 g/dL — ABNORMAL LOW (ref 3.5–5.0)
Anion Gap: 7 mEq/L (ref 3–11)
CO2: 24 mEq/L (ref 22–29)
Calcium: 9.6 mg/dL (ref 8.4–10.4)
Chloride: 103 mEq/L (ref 98–109)
Creatinine: 0.6 mg/dL (ref 0.6–1.1)
Glucose: 125 mg/dl (ref 70–140)
Potassium: 4.1 mEq/L (ref 3.5–5.1)

## 2013-07-08 NOTE — Progress Notes (Signed)
OFFICE PROGRESS NOTE  Interval history:  April Spence returns as scheduled. She continues to have right-sided abdominal pain and back pain. The back pain is chronic. She is taking Percocet and Ultram for the abdominal pain with partial relief. She notes that the pain is worse at nighttime. She reports increased nausea and "dry heaves". Bowels have been moving regularly. She feels the jaundice is less.   Objective: Blood pressure 129/71, pulse 85, temperature 98 F (36.7 C), temperature source Oral, resp. rate 19, height 5' (1.524 m), weight 126 lb 11.2 oz (57.471 kg).  Very mild scleral icterus. Oropharynx is without thrush. Lungs are clear. Breath sounds diminished at the right base. Regular cardiac rhythm. Port-A-Cath site is without erythema. Abdomen is soft and nontender. No obvious hepatomegaly. Left upper quadrant drain site is without erythema or drainage. No leg edema.  Lab Results: Lab Results  Component Value Date   WBC 7.8 05/16/2013   HGB 12.0 05/16/2013   HCT 35.4 05/16/2013   MCV 96.3 05/16/2013   PLT 213 05/16/2013    Chemistry:    Chemistry      Component Value Date/Time   NA 135* 07/08/2013 1313   NA 133* 04/30/2013 1215   NA 136 05/02/2011 0954   K 4.1 07/08/2013 1313   K 3.6 04/30/2013 1215   K 4.5 05/02/2011 0954   CL 96 04/30/2013 1215   CL 99 02/25/2013 1124   CL 93* 05/02/2011 0954   CO2 24 07/08/2013 1313   CO2 28 04/30/2013 1215   CO2 29 05/02/2011 0954   BUN 8.3 07/08/2013 1313   BUN 7 04/30/2013 1215   BUN 12 05/02/2011 0954   CREATININE 0.6 07/08/2013 1313   CREATININE 0.28* 04/30/2013 1215   CREATININE 0.5* 05/02/2011 0954      Component Value Date/Time   CALCIUM 9.6 07/08/2013 1313   CALCIUM 10.2 04/30/2013 1215   CALCIUM 9.3 05/02/2011 0954   ALKPHOS 381* 07/08/2013 1313   ALKPHOS 266* 04/30/2013 1215   ALKPHOS 78 05/02/2011 0954   AST 35* 07/08/2013 1313   AST 46* 04/30/2013 1215   AST 31 05/02/2011 0954   ALT 14 07/08/2013 1313   ALT 24 04/30/2013 1215   ALT  26 05/02/2011 0954   BILITOT 2.25* 07/08/2013 1313   BILITOT 4.8* 04/30/2013 1215   BILITOT 0.40 05/02/2011 0954       Studies/Results: Ct Abdomen Pelvis W Contrast  06/23/2013   CLINICAL DATA:  Colon cancer diagnosed 8/10 and 8/12. Chemotherapy and radiation therapy complete. Colon resection, cholecystectomy, and 2 liver stents. Right upper quadrant and lateral abdominal pain. Constipation. Nausea and vomiting.  EXAM: CT ABDOMEN AND PELVIS WITH CONTRAST  TECHNIQUE: Multidetector CT imaging of the abdomen and pelvis was performed using the standard protocol following bolus administration of intravenous contrast.  CONTRAST:  50mL OMNIPAQUE IOHEXOL 300 MG/ML SOLN, OMNIPAQUE IOHEXOL 300 MG/ML SOLN  COMPARISON:  Percutaneous cholangiogram of 04/2013. Most recent CT of 03/11/2013. Marland Kitchen  FINDINGS: Lower Chest: Clear lung bases. Normal heart size. Central line with its tip in the right atrium on this supine study. Persistent trace right pleural fluid or thickening.  Abdomen/Pelvis: Right liver lobe low-density lesion is again partially obscured by the right-sided hepatic duct stent. This measures on the order of 2.2 x 1.6 cm on image 19. Less conspicuous than at 2.2 x 1.9 cm on the prior exam.  No new focal liver lesions are identified. Since the prior CT, placement of a percutaneous drain to the previously  dilated left intrahepatic system. This dilatation has been relieved. Ducts in the segment 4B region measure upper normal on image 27/series 2.  Normal spleen, stomach, pancreas, adrenal glands, kidneys.  No retroperitoneal or retrocrural adenopathy. Colonic stool burden suggests constipation. Normal small bowel caliber. No ascites. No evidence of omental or peritoneal disease. Anatomy distorted by the extent of convex right lumbar spine curvature.  No pelvic adenopathy. Normal urinary bladder. Prominent pelvic veins. Pelvic floor laxity. Posterior uterine fundal fibroid with dystrophic calcifications. No  significant free fluid.  Bones/Musculoskeletal: Surgical or developmental absence of the posterior elements at multiple lumbar levels.  IMPRESSION: 1. Since 03/11/2013, interval improvement in appearance of the liver. Placement of a percutaneous biliary drain with resolution of left-sided biliary obstruction. The right-sided biliary stent remains in place and the partially obscured liver lesion is further decreased in size. 2.  Possible constipation. 3. Pelvic floor laxity with possible pelvic congestion syndrome. 4. Anatomy again distorted by the extent of spinal curvature. 5. Uterine fibroid.   Electronically Signed   By: Jeronimo Greaves M.D.   On: 06/23/2013 16:14   Ir Cholan Exist Tube  06/09/2013   *RADIOLOGY REPORT*  Clinical Data: Metastatic colon cancer with central biliary obstruction  CHOLANGIOGRAM THROUGH EXISTING CATHETER  BILIARY DUCT STRICTURE BALLOON DILATATION (CHOLANGIOPLASTY)  REPLACEMENT OF THE 10-FRENCH INTERNAL EXTERNAL LEFT BILIARY DRAIN CATHETER  Date:  06/09/2013 14:00:00  Radiologist:  M. Ruel Favors, M.D.  Medications:  3.375 grams of Zosyn administered within 1 hour of the procedure  Guidance:  Fluoroscopic  Fluoroscopy time:  7 minutes 36 seconds  Sedation time:  None.  Contrast volume:  10 ml Omnipaque-300  Complications:  No immediate  PROCEDURE/FINDINGS:  Informed consent was obtained from the patient following explanation of the procedure, risks, benefits and alternatives. The patient understands, agrees and consents for the procedure. All questions were addressed.  A time out was performed.  Maximal barrier sterile technique utilized including caps, mask, sterile gowns, sterile gloves, large sterile drape, hand hygiene, and betadine  Initial cholangiogram demonstrates obstruction of the internal external biliary drain.  Contrast leaks along the percutaneous tract outside the tube to the skin surface.  Catheter position is in the duodenum.  Catheter was removed over a guide wire  without difficulty.  Guide wire exchange was performed for an Amplatz guide wire through a Kumpe catheter.  Attempts were made to pass a new 10-French internal external biliary drain over the guide wire however the catheter would not pass through the interstices of the existing metallic biliary stent.  4 mm balloon dilatation was performed of the biliary stent at the confluence.  Despite this, the stent still would not pass centrally.  The balloon was upsized to a 6 mm balloon.  6 mm angioplasty was performed at the biliary confluence through the stent interstices.  Finally, a new 10-French internal external biliary drain would pass through the stent into the duodenum.  Retention loop formed in the duodenum.  Contrast injection confirms adequate decompression of the ducts and opacification of the duodenum.  Images obtained for documentation.  Drain catheter secured with an external suture and connected to external drainage.  The patient tolerated the procedure well.  No immediate complication.  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  Successful replacement of the 10-French internal external biliary drain but this required additional 4 and 6 mm cholangioplasty at the left biliary confluence to retraverse the existing permanent stent interstices.   Original Report Authenticated  By: Osvaldo Shipper, M.D.   Ir Biliary Dilitation  06/09/2013   *RADIOLOGY REPORT*  Clinical Data: Metastatic colon cancer with central biliary obstruction  CHOLANGIOGRAM THROUGH EXISTING CATHETER  BILIARY DUCT STRICTURE BALLOON DILATATION (CHOLANGIOPLASTY)  REPLACEMENT OF THE 10-FRENCH INTERNAL EXTERNAL LEFT BILIARY DRAIN CATHETER  Date:  06/09/2013 14:00:00  Radiologist:  M. Ruel Favors, M.D.  Medications:  3.375 grams of Zosyn administered within 1 hour of the procedure  Guidance:  Fluoroscopic  Fluoroscopy time:  7 minutes 36 seconds  Sedation time:  None.  Contrast volume:  10 ml  Omnipaque-300  Complications:  No immediate  PROCEDURE/FINDINGS:  Informed consent was obtained from the patient following explanation of the procedure, risks, benefits and alternatives. The patient understands, agrees and consents for the procedure. All questions were addressed.  A time out was performed.  Maximal barrier sterile technique utilized including caps, mask, sterile gowns, sterile gloves, large sterile drape, hand hygiene, and betadine  Initial cholangiogram demonstrates obstruction of the internal external biliary drain.  Contrast leaks along the percutaneous tract outside the tube to the skin surface.  Catheter position is in the duodenum.  Catheter was removed over a guide wire without difficulty.  Guide wire exchange was performed for an Amplatz guide wire through a Kumpe catheter.  Attempts were made to pass a new 10-French internal external biliary drain over the guide wire however the catheter would not pass through the interstices of the existing metallic biliary stent.  4 mm balloon dilatation was performed of the biliary stent at the confluence.  Despite this, the stent still would not pass centrally.  The balloon was upsized to a 6 mm balloon.  6 mm angioplasty was performed at the biliary confluence through the stent interstices.  Finally, a new 10-French internal external biliary drain would pass through the stent into the duodenum.  Retention loop formed in the duodenum.  Contrast injection confirms adequate decompression of the ducts and opacification of the duodenum.  Images obtained for documentation.  Drain catheter secured with an external suture and connected to external drainage.  The patient tolerated the procedure well.  No immediate complication.  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  Successful replacement of the 10-French internal external biliary drain but this required additional 4 and 6 mm cholangioplasty at the  left biliary confluence to retraverse the existing permanent stent interstices.   Original Report Authenticated By: Judie Petit. Miles Costain, M.D.   Ir Catheter Tube Change  06/09/2013   *RADIOLOGY REPORT*  Clinical Data: Metastatic colon cancer with central biliary obstruction  CHOLANGIOGRAM THROUGH EXISTING CATHETER  BILIARY DUCT STRICTURE BALLOON DILATATION (CHOLANGIOPLASTY)  REPLACEMENT OF THE 10-FRENCH INTERNAL EXTERNAL LEFT BILIARY DRAIN CATHETER  Date:  06/09/2013 14:00:00  Radiologist:  M. Ruel Favors, M.D.  Medications:  3.375 grams of Zosyn administered within 1 hour of the procedure  Guidance:  Fluoroscopic  Fluoroscopy time:  7 minutes 36 seconds  Sedation time:  None.  Contrast volume:  10 ml Omnipaque-300  Complications:  No immediate  PROCEDURE/FINDINGS:  Informed consent was obtained from the patient following explanation of the procedure, risks, benefits and alternatives. The patient understands, agrees and consents for the procedure. All questions were addressed.  A time out was performed.  Maximal barrier sterile technique utilized including caps, mask, sterile gowns, sterile gloves, large sterile drape, hand hygiene, and betadine  Initial cholangiogram demonstrates obstruction of the internal external biliary drain.  Contrast leaks along the percutaneous tract outside the tube to  the skin surface.  Catheter position is in the duodenum.  Catheter was removed over a guide wire without difficulty.  Guide wire exchange was performed for an Amplatz guide wire through a Kumpe catheter.  Attempts were made to pass a new 10-French internal external biliary drain over the guide wire however the catheter would not pass through the interstices of the existing metallic biliary stent.  4 mm balloon dilatation was performed of the biliary stent at the confluence.  Despite this, the stent still would not pass centrally.  The balloon was upsized to a 6 mm balloon.  6 mm angioplasty was performed at the biliary confluence  through the stent interstices.  Finally, a new 10-French internal external biliary drain would pass through the stent into the duodenum.  Retention loop formed in the duodenum.  Contrast injection confirms adequate decompression of the ducts and opacification of the duodenum.  Images obtained for documentation.  Drain catheter secured with an external suture and connected to external drainage.  The patient tolerated the procedure well.  No immediate complication.  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  Successful replacement of the 10-French internal external biliary drain but this required additional 4 and 6 mm cholangioplasty at the left biliary confluence to retraverse the existing permanent stent interstices.   Original Report Authenticated By: Judie Petit. Miles Costain, M.D.    Medications: I have reviewed the patient's current medications.  Assessment/Plan:  1. 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 She completed 12 cycles of FOLFOX chemotherapy. Oxaliplatin was held with cycles number 5, 8, 11, and 12. 2. Metastatic colon cancer confirmed on a restaging CT evaluation 05/02/2011 with liver metastases and periportal lymphadenopathy. She began treatment with FOLFIRI on 06/01/2011. Avastin was added beginning with cycle number 2. She completed cycle #6 08/08/2011. Restaging CT evaluation 08/15/2011 showed improvement in the liver metastases and porta hepatis lymphadenopathy. She completed cycle #12 FOLFIRI/Avastin 11/07/2011. Restaging CT evaluation 11/24/2011 showed decrease in size of hepatic metastases and no evidence of disease progression or new metastasis. She completed cycle 18 on 03/11/2012. A restaging CT 03/29/2012 revealed stable disease. Irinotecan was discontinued and treatment continued with 5-FU/leucovorin and Avastin on a 3  week schedule beginning 04/02/2012. -restaging CT 08/15/2012 with slight enlargement of the central liver lesion and progression of intrahepatic biliary dilatation, no other liver lesions and no other evidence of progressive metastatic disease. CEA stable on 08/20/2012. Treatment continued with 5-FU and Avastin on a 3 week schedule.restaging CT 09/30/2012 found the dominant central right liver metastasis to be slightly larger with progressive intrahepatic biliary dilatation, a previously described left hepatic lobe lesion appeared smaller measuring 10 mm and appeared separate from the liver and is felt to most likely represent a porta hepatis lymph node.A. Suspected 9 mm metastasis was noted in the medial left hepatic lobe on an MRI 10/10/2012 -status post stereotactic radiosurgeryto the dominant liver metastasis in 3 fractions February 10 through 10/28/2012. 5-FU/Avastin was resumed on a 3 week schedule 12/03/2012. CEA in normal range on 12/03/2012 and 01/14/2013. Restaging MRI of the liver 02/21/2013 with a decrease in the dominant right hepatic lesion, stable medial left hepatic lobe lesion-but the left lesion is now causing obstruction of the left hepatic bile ducts. CT 06/23/2013 with a decrease in the right liver lesion, relief of the obstructed left biliary system, and no evidence  of progressive metastatic disease. 3. Status post Port-A-Cath placement and Port-A-Cath removal by Dr. Michaell Cowing. A new Port-A-Cath was placed on 05/16/2011. The Port-A-Cath was removed due to malposition. A new Port-A-Cath was placed on 05/30/2011. 4. Spina bifida with severe scoliosis.  5. Chronic bilateral foot drop/lower leg and foot numbness. 6. Hypothyroidism. 7. History of chemotherapy-induced diarrhea. The bolus and infusional 5-FU were reduced by 25% beginning with cycle number 2 of FOLFOX. The 5-FU dose was decreased by an additional 10% beginning with cycle number 8. 8. History of delayed nausea following  chemotherapy. 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 The oxaliplatin was dose reduced by 20% beginning with cycle number 6. The neuropathy symptoms resolved. 10. History of neutropenia secondary to chemotherapy. 11. Status post a colonoscopy 05/05/2010. There was a previous right colectomy anastomosis and slight nodularity. Biopsies were taken which showed benign colonic mucosa with hyperplastic epithelial changes. No significant inflammation, granuloma, or adenomatous epithelium was identified. 12. Diarrhea following FOLFIRI chemotherapy. Overall the diarrhea is controlled with Lomotil.  13. History of a fungal rash in the groin, improved with Diflucan and nystatin powder. 14. Mouth ulcers, oral candidiasis following cycle #6 of FOLFIRI-the ulcers resolved following treatment with Diflucan. She continues to note mild mouth ulcers following each treatment. She developed mucositis during infusional 5-fluorouracil/concurrent radiation given beginning on 04/07/2013. 15. Nausea during chemotherapy-Zofran was added to the chemotherapy regimen beginning on 07/09/2012. 16. Oozing at the gumline secondary to periodontal disease and Avastin 17. Jaundice secondary to biliary obstruction status post placement of a metallic biliary stent on 02/27/2013 without improvement in LFTs. Status post placement of a percutaneous biliary drain by interventional radiology on 03/13/2013. She underwent a cholangiogram on 03/21/2013 with evidence of a high-grade obstruction at the level of the previously placed metallic biliary stent. The catheter could not be advanced. The external drainage catheter was upsized. Palliative radiation and infusional 5-FU given 04/07/2013 through 04/24/2013 in an attempt to shrink the liver metastasis so the biliary catheters can be advanced. Internalization of biliary drain on 04/30/2013; new external biliary drain in place 06/09/2013. 18. Mucositis secondary chemotherapy.  Resolved. 19. Abdominal/back pain-etiology unclear. The pain could be related to an unrecognized metastatic disease in the abdomen or retroperitoneum. I have a low clinical suspicion for metastatic disease involving the spine. It is possible the pain is in part related to constipation.  Disposition-the jaundice and bilirubin are further improved. She continues to have right-sided abdominal pain of unclear etiology. We discussed proceeding with a PET scan. Since the pain has not worsened since her last visit she prefers to hold on the PET scan. She will return for a followup visit in approximately 2 weeks. She will contact the office before that visit with worsening pain.  Plan reviewed with Dr. Derenda Fennel, Misty Stanley ANP/GNP-BC

## 2013-07-08 NOTE — Telephone Encounter (Signed)
appts made and printed...td 

## 2013-07-16 ENCOUNTER — Other Ambulatory Visit: Payer: Self-pay | Admitting: Radiology

## 2013-07-16 ENCOUNTER — Encounter (HOSPITAL_COMMUNITY): Payer: Self-pay | Admitting: Pharmacy Technician

## 2013-07-18 ENCOUNTER — Telehealth: Payer: Self-pay | Admitting: *Deleted

## 2013-07-18 DIAGNOSIS — E039 Hypothyroidism, unspecified: Secondary | ICD-10-CM

## 2013-07-18 MED ORDER — LEVOTHYROXINE SODIUM 150 MCG PO TABS: 150.0000 ug | ORAL_TABLET | Freq: Every day | ORAL | Status: DC

## 2013-07-18 NOTE — Telephone Encounter (Signed)
Refill  Fax    on levothyroxine   #90  [facility] [address] [place] [address] [phone] ph [phone] fax

## 2013-07-21 ENCOUNTER — Ambulatory Visit (HOSPITAL_COMMUNITY)
Admission: RE | Admit: 2013-07-21 | Discharge: 2013-07-21 | Disposition: A | Discharge status: Home / Self Care | Payer: Medicare HMO | Source: Ambulatory Visit | Attending: Interventional Radiology | Admitting: Interventional Radiology

## 2013-07-21 ENCOUNTER — Other Ambulatory Visit (HOSPITAL_COMMUNITY): Payer: Self-pay | Admitting: Interventional Radiology

## 2013-07-21 DIAGNOSIS — C189 Malignant neoplasm of colon, unspecified: Secondary | ICD-10-CM | POA: Insufficient documentation

## 2013-07-21 DIAGNOSIS — Z87891 Personal history of nicotine dependence: Secondary | ICD-10-CM | POA: Insufficient documentation

## 2013-07-21 DIAGNOSIS — E039 Hypothyroidism, unspecified: Secondary | ICD-10-CM | POA: Insufficient documentation

## 2013-07-21 DIAGNOSIS — Z79899 Other long term (current) drug therapy: Secondary | ICD-10-CM | POA: Insufficient documentation

## 2013-07-21 DIAGNOSIS — C787 Secondary malignant neoplasm of liver and intrahepatic bile duct: Secondary | ICD-10-CM

## 2013-07-21 DIAGNOSIS — E785 Hyperlipidemia, unspecified: Secondary | ICD-10-CM | POA: Insufficient documentation

## 2013-07-21 DIAGNOSIS — K831 Obstruction of bile duct: Secondary | ICD-10-CM | POA: Insufficient documentation

## 2013-07-21 LAB — CBC
HCT: 36 % (ref 36.0–46.0)
Hemoglobin: 11.8 g/dL — ABNORMAL LOW (ref 12.0–15.0)
RBC: 3.69 MIL/uL — ABNORMAL LOW (ref 3.87–5.11)
RDW: 14.3 % (ref 11.5–15.5)
WBC: 8.5 10*3/uL (ref 4.0–10.5)

## 2013-07-21 LAB — COMPREHENSIVE METABOLIC PANEL
ALT: 15 U/L (ref 0–35)
Alkaline Phosphatase: 243 U/L — ABNORMAL HIGH (ref 39–117)
BUN: 13 mg/dL (ref 6–23)
CO2: 27 mEq/L (ref 19–32)
Chloride: 101 mEq/L (ref 96–112)
GFR calc non Af Amer: >90 mL/min (ref >90)
Glucose, Bld: 131 mg/dL — ABNORMAL HIGH (ref 70–99)
Potassium: 3.8 mEq/L (ref 3.5–5.1)
Sodium: 135 mEq/L (ref 135–145)

## 2013-07-21 LAB — PROTIME-INR: INR: 1.22 (ref 0.00–1.49)

## 2013-07-21 MED ORDER — MIDAZOLAM HCL 2 MG/2ML IJ SOLN
INTRAMUSCULAR | Status: AC
  Filled 2013-07-21: qty 6

## 2013-07-21 MED ORDER — IOHEXOL 300 MG/ML  SOLN
INTRAMUSCULAR | Status: AC | PRN
  Administered 2013-07-21: 1 mL

## 2013-07-21 MED ORDER — LIDOCAINE HCL 1 % IJ SOLN
INTRAMUSCULAR | Status: AC
  Filled 2013-07-21: qty 20

## 2013-07-21 MED ORDER — PIPERACILLIN-TAZOBACTAM 3.375 G IVPB
3.3750 g | Freq: Once | INTRAVENOUS | Status: AC
  Administered 2013-07-21: 3.375 g via INTRAVENOUS
  Filled 2013-07-21: qty 50

## 2013-07-21 MED ORDER — FENTANYL CITRATE 0.05 MG/ML IJ SOLN
INTRAMUSCULAR | Status: AC | PRN
  Administered 2013-07-21: 100 ug via INTRAVENOUS
  Administered 2013-07-21: 50 ug via INTRAVENOUS

## 2013-07-21 MED ORDER — MIDAZOLAM HCL 2 MG/2ML IJ SOLN
INTRAMUSCULAR | Status: AC | PRN
  Administered 2013-07-21 (×3): 1 mg via INTRAVENOUS

## 2013-07-21 MED ORDER — FENTANYL CITRATE 0.05 MG/ML IJ SOLN
INTRAMUSCULAR | Status: AC
  Filled 2013-07-21: qty 6

## 2013-07-21 MED ORDER — SODIUM CHLORIDE 0.9 % IV SOLN
Freq: Once | INTRAVENOUS | Status: AC
  Administered 2013-07-21: 08:00:00 via INTRAVENOUS

## 2013-07-21 NOTE — Progress Notes (Signed)
Pt states she has a portacath but does not wish to use it. Pt states there was some problems with it when it was used last.   Pt wishes for peripheral iv.

## 2013-07-21 NOTE — H&P (Signed)
April Spence is an 57 y.o. female.   Chief Complaint: "I'm getting my liver catheter changed" HPI: Patient with history of metastatic colon carcinoma and central biliary obstruction with prior placement of an internal/external biliary drain presents today for routine biliary drain exchange.  Past Medical History  Diagnosis Date  . colon ca dx'd 04/2009    chemo comp 11/2009.. colon and liver  . Spina bifida   . Scoliosis   . Hyperlipidemia   . Foot drop     bilateral,wears braces  . Mitral valve prolapse   . Hypothyroidism   . Depression   . History of chemotherapy     5-FU and Avastin 12 cycles FOLFOX  . Colon cancer 05/06/2010    metastatic  . Hepatic metastases 08/15/12    per ct abdomen  . Radiation 10/21/12-10/28/12    Palliative liver mets 54 gray in 3 fx's  . Arrhythmia 02-26-13    "rapid heart rate occ."  . History of radiation therapy 10/21/2012-10/28/2012    54 gray to liver metastasis  . History of radiation therapy 04/07/2013-04/24/2013    45 gray to liver metastasis    Past Surgical History  Procedure Laterality Date  . Cholecystectomy    . Back surgery  1980 and 2010  . Knee arthroscopy  2005    left  . Tubal ligation  1973  . Colonoscopy  05/05/2010  . Colon surgery      2010  . Portacath placement  05/30/2011    tip at cavoatrialjjunction by xray  . Ercp N/A 02/27/2013    Procedure: ENDOSCOPIC RETROGRADE CHOLANGIOPANCREATOGRAPHY (ERCP);  Surgeon: Rachael Fee, MD;  Location: [facility];  Service: Endoscopy;  Laterality: N/A;  . Biliary stent placement N/A 02/27/2013    Procedure: BILIARY STENT PLACEMENT;  Surgeon: Rachael Fee, MD;  Location: [facility];  Service: Endoscopy;  Laterality: N/A;  . Percutaneous transhepatic cholangiograhpy and billiary drainage  03/21/2013    Family History  Problem Relation Age of Onset  . Cancer Sister     breast  . Cancer Maternal Grandmother     breast  . Cancer Other 59    breast ca   Social History:   reports that she quit smoking about 7 years ago. She has never used smokeless tobacco. She reports that she does not drink alcohol or use illicit drugs.  Allergies:  Allergies  Allergen Reactions  . Adhesive [Tape]     Pt is fine with paper tape    Current outpatient prescriptions:amitriptyline (ELAVIL) 25 MG tablet, Take 25 mg by mouth at bedtime. , Disp: , Rfl: ;  chlorhexidine (PERIDEX) 0.12 % solution, Use as directed 15 mLs in the mouth or throat 2 (two) times daily. , Disp: , Rfl: ;  Cholecalciferol (VITAMIN D-3) 5000 UNITS TABS, Take 5,000 Units by mouth 2 (two) times daily. , Disp: , Rfl:  diphenoxylate-atropine (LOMOTIL) 2.5-0.025 MG per tablet, Take 1 tablet by mouth 4 (four) times daily as needed for diarrhea or loose stools., Disp: , Rfl: ;  docusate sodium (COLACE) 100 MG capsule, Take 100 mg by mouth 2 (two) times daily. , Disp: , Rfl: ;  FLUoxetine (PROZAC) 20 MG capsule, Take 20 mg by mouth every morning. , Disp: , Rfl:  levothyroxine (SYNTHROID, LEVOTHROID) 150 MCG tablet, Take 1 tablet (150 mcg total) by mouth daily before breakfast., Disp: 90 tablet, Rfl: 1;  LORazepam (ATIVAN) 0.5 MG tablet, Take 1 tablet (0.5 mg total) by mouth every 12 (twelve) hours as  needed for anxiety., Disp: 30 tablet, Rfl: 2;  ondansetron (ZOFRAN) 8 MG tablet, Take 1 tablet (8 mg total) by mouth every 12 (twelve) hours as needed for nausea., Disp: 20 tablet, Rfl: 2 oxyCODONE-acetaminophen (PERCOCET/ROXICET) 5-325 MG per tablet, Take 1 tablet by mouth every 4 (four) hours as needed for pain., Disp: 100 tablet, Rfl: 0;  Polyethylene Glycol 3350 (MIRALAX PO), Take 17 g by mouth daily. Mix in 8 ounces fluid, Disp: , Rfl: ;  prochlorperazine (COMPAZINE) 10 MG tablet, Take 10 mg by mouth every 6 (six) hours as needed (nausea). , Disp: , Rfl:  propranolol (INDERAL) 80 MG tablet, Take 80 mg by mouth every morning. , Disp: , Rfl: ;  traMADol-acetaminophen (ULTRACET) 37.5-325 MG per tablet, Take 1 tablet by mouth every  6 (six) hours as needed for pain. , Disp: , Rfl: ;  ALPRAZolam (XANAX) 0.25 MG tablet, Take 0.25 mg by mouth 2 (two) times daily as needed for sleep or anxiety., Disp: , Rfl:  Alum & Mag Hydroxide-Simeth (MAGIC MOUTHWASH) SOLN, Take 10 mL by mouth 4 (four) times daily as needed. Mouthsores, Disp: , Rfl: ;  lidocaine-prilocaine (EMLA) cream, Apply 1 application topically as needed (use on port)., Disp: , Rfl: ;  nystatin (NYSTOP) 100000 UNIT/GM POWD, Apply 1 g topically daily as needed (for rash). In summer on irritated areas, Disp: , Rfl:  Current facility-administered medications:piperacillin-tazobactam (ZOSYN) IVPB 3.375 g, 3.375 g, Intravenous, Once, Berneta Levins, PA-C   Results for orders placed during the hospital encounter of 07/21/13 (from the past 48 hour(s))  CBC     Status: Abnormal   Collection Time    07/21/13  8:00 AM      Result Value Range   WBC 8.5  4.0 - 10.5 K/uL   RBC 3.69 (*) 3.87 - 5.11 MIL/uL   Hemoglobin 11.8 (*) 12.0 - 15.0 g/dL   HCT 16.1  09.6 - 04.5 %   MCV 97.6  78.0 - 100.0 fL   MCH 32.0  26.0 - 34.0 pg   MCHC 32.8  30.0 - 36.0 g/dL   RDW 40.9  81.1 - 91.4 %   Platelets 183  150 - 400 K/uL  COMPREHENSIVE METABOLIC PANEL     Status: Abnormal   Collection Time    07/21/13  8:00 AM      Result Value Range   Sodium 135  135 - 145 mEq/L   Potassium 3.8  3.5 - 5.1 mEq/L   Chloride 101  96 - 112 mEq/L   CO2 27  19 - 32 mEq/L   Glucose, Bld 131 (*) 70 - 99 mg/dL   BUN 13  6 - 23 mg/dL   Creatinine, Ser 7.82 (*) 0.50 - 1.10 mg/dL   Calcium 9.7  8.4 - 95.6 mg/dL   Total Protein 8.1  6.0 - 8.3 g/dL   Albumin 2.8 (*) 3.5 - 5.2 g/dL   AST 35  0 - 37 U/L   ALT 15  0 - 35 U/L   Alkaline Phosphatase 243 (*) 39 - 117 U/L   Total Bilirubin 1.4 (*) 0.3 - 1.2 mg/dL   GFR calc non Af Amer >90  >90 mL/min   GFR calc Af Amer >90  >90 mL/min   Comment: (NOTE)     The eGFR has been calculated using the CKD EPI equation.     This calculation has not been validated in  all clinical situations.     eGFR's persistently <90 mL/min signify possible Chronic  Kidney     Disease.  PROTIME-INR     Status: None   Collection Time    07/21/13  8:00 AM      Result Value Range   Prothrombin Time 15.1  11.6 - 15.2 seconds   INR 1.22  0.00 - 1.49   No results found.  Review of Systems  Constitutional: Negative for fever and chills.  Respiratory: Negative for cough and shortness of breath.   Cardiovascular: Negative for chest pain.  Gastrointestinal: Positive for nausea and abdominal pain. Negative for vomiting.  Musculoskeletal: Positive for back pain.  Neurological: Negative for headaches.  Endo/Heme/Allergies: Does not bruise/bleed easily.   Vitals: BP 123/77  HR  87  R 18  TEMP 98.2  O2 SATS 99% RA Physical Exam  Constitutional: She is oriented to person, place, and time. She appears well-developed and well-nourished.  Cardiovascular: Normal rate and regular rhythm.   Respiratory: Effort normal.  Sl dim BS rt base, left clear  GI: Soft. Bowel sounds are normal.  Intact left biliary drain  Musculoskeletal:  Hx spina bifida, chronic bilateral foot drop  Neurological: She is alert and oriented to person, place, and time.     Assessment/Plan Pt with metastatic colon carcinoma with central biliary obstruction and prior placement of an internal/external biliary drain. Plan is for routine biliary drain exchange today with IV conscious sedation. Details/risks of procedure d/w pt/husband with their understanding and consent.  ALLRED,D KEVIN 07/21/2013, 9:14 AM

## 2013-07-21 NOTE — Procedures (Signed)
Successful LT 10FR BILIARY DRAIN EXCHANGED NO COMP STABLE FULL PEPORT IN PACS

## 2013-07-25 ENCOUNTER — Other Ambulatory Visit: Payer: Self-pay | Admitting: Internal Medicine

## 2013-07-25 ENCOUNTER — Other Ambulatory Visit: Payer: Self-pay | Admitting: Radiology

## 2013-07-25 ENCOUNTER — Other Ambulatory Visit: Payer: Self-pay | Admitting: Oncology

## 2013-07-25 ENCOUNTER — Ambulatory Visit (HOSPITAL_BASED_OUTPATIENT_CLINIC_OR_DEPARTMENT_OTHER): Payer: Medicare HMO | Admitting: Oncology

## 2013-07-25 ENCOUNTER — Ambulatory Visit (HOSPITAL_COMMUNITY)
Admission: RE | Admit: 2013-07-25 | Discharge: 2013-07-25 | Disposition: A | Discharge status: Home / Self Care | Payer: Medicare HMO | Source: Ambulatory Visit | Attending: Oncology | Admitting: Oncology

## 2013-07-25 ENCOUNTER — Telehealth: Payer: Self-pay | Admitting: Oncology

## 2013-07-25 ENCOUNTER — Other Ambulatory Visit (HOSPITAL_BASED_OUTPATIENT_CLINIC_OR_DEPARTMENT_OTHER): Payer: Medicare HMO | Admitting: Lab

## 2013-07-25 VITALS — BP 116/71 | HR 87 | Temp 97.7°F | Resp 18 | Ht 60.0 in | Wt 126.6 lb

## 2013-07-25 DIAGNOSIS — C180 Malignant neoplasm of cecum: Secondary | ICD-10-CM

## 2013-07-25 DIAGNOSIS — C787 Secondary malignant neoplasm of liver and intrahepatic bile duct: Secondary | ICD-10-CM

## 2013-07-25 DIAGNOSIS — C189 Malignant neoplasm of colon, unspecified: Secondary | ICD-10-CM

## 2013-07-25 DIAGNOSIS — Z452 Encounter for adjustment and management of vascular access device: Secondary | ICD-10-CM

## 2013-07-25 DIAGNOSIS — R109 Unspecified abdominal pain: Secondary | ICD-10-CM

## 2013-07-25 LAB — COMPREHENSIVE METABOLIC PANEL (CC13)
ALT: 27 U/L (ref 0–55)
Alkaline Phosphatase: 293 U/L — ABNORMAL HIGH (ref 40–150)
Anion Gap: 7 mEq/L (ref 3–11)
CO2: 28 mEq/L (ref 22–29)
Calcium: 10 mg/dL (ref 8.4–10.4)
Chloride: 101 mEq/L (ref 98–109)
Sodium: 136 mEq/L (ref 136–145)
Total Bilirubin: 5.9 mg/dL — ABNORMAL HIGH (ref 0.20–1.20)
Total Protein: 7.8 g/dL (ref 6.4–8.3)

## 2013-07-25 MED ORDER — SODIUM CHLORIDE 0.9 % IJ SOLN
10.0000 mL | INTRAMUSCULAR | Status: DC | PRN
  Administered 2013-07-25: 10 mL via INTRAVENOUS
  Filled 2013-07-25: qty 10

## 2013-07-25 MED ORDER — HEPARIN SOD (PORK) LOCK FLUSH 100 UNIT/ML IV SOLN
500.0000 [IU] | Freq: Once | INTRAVENOUS | Status: AC
  Administered 2013-07-25: 500 [IU] via INTRAVENOUS
  Filled 2013-07-25: qty 5

## 2013-07-25 MED ORDER — OXYCODONE-ACETAMINOPHEN 5-325 MG PO TABS: 1.0000 | ORAL_TABLET | ORAL | Status: DC | PRN

## 2013-07-25 NOTE — Telephone Encounter (Signed)
gv and printed appt sched and avs for pt for DEC °

## 2013-07-25 NOTE — Progress Notes (Signed)
[facility]    OFFICE PROGRESS NOTE   INTERVAL HISTORY:   She returns for scheduled visit. The abdominal pain is partially improved. She continues to take Percocet. She has intermittent nausea and takes one or 2 Compazine tablets per day.  Ms. Pies underwent a biliary drain exchange in interventional radiology on 07/21/2013. She reports mild soreness at the drain site.  Objective:  Vital signs in last 24 hours:  Blood pressure 116/71, pulse 87, temperature 97.7 F (36.5 C), temperature source Oral, resp. rate 18, height 5' (1.524 m), weight 126 lb 9.6 oz (57.425 kg), SpO2 99.00%.    HEENT: Scleral icterus. Resp: Distant breath sounds, scattered rhonchi, no respiratory distress Cardio: Regular rate and rhythm GI: Mild tenderness in the mid upper abdomen. The gauze at the biliary drain site is saturated, mild erythema surrounding the sutures. Vascular: Trace low leg edema bilaterally  Skin: Jaundice   Portacath/PICC-without erythema  Lab Results:  Lab Results  Component Value Date   WBC 8.5 07/21/2013   HGB 11.8* 07/21/2013   HCT 36.0 07/21/2013   MCV 97.6 07/21/2013   PLT 183 07/21/2013   07/21/2013-bilirubin 1.4  07/25/2013-bilirubin 5.9   Medications: I have reviewed the patient's current medications.  Assessment/Plan: 1. 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 She completed 12 cycles of FOLFOX chemotherapy. Oxaliplatin was held with cycles number 5, 8, 11, and 12. 2. Metastatic colon cancer confirmed on a restaging CT evaluation 05/02/2011 with liver metastases and periportal lymphadenopathy. She began treatment with FOLFIRI on 06/01/2011. Avastin was added beginning with cycle number 2. She completed cycle #6 08/08/2011. Restaging CT evaluation 08/15/2011 showed improvement in the liver metastases and porta hepatis  lymphadenopathy. She completed cycle #12 FOLFIRI/Avastin 11/07/2011. Restaging CT evaluation 11/24/2011 showed decrease in size of hepatic metastases and no evidence of disease progression or new metastasis. She completed cycle 18 on 03/11/2012. A restaging CT 03/29/2012 revealed stable disease. Irinotecan was discontinued and treatment continued with 5-FU/leucovorin and Avastin on a 3 week schedule beginning 04/02/2012. -restaging CT 08/15/2012 with slight enlargement of the central liver lesion and progression of intrahepatic biliary dilatation, no other liver lesions and no other evidence of progressive metastatic disease. CEA stable on 08/20/2012. Treatment continued with 5-FU and Avastin on a 3 week schedule.restaging CT 09/30/2012 found the dominant central right liver metastasis to be slightly larger with progressive intrahepatic biliary dilatation, a previously described left hepatic lobe lesion appeared smaller measuring 10 mm and appeared separate from the liver and is felt to most likely represent a porta hepatis lymph node.A. Suspected 9 mm metastasis was noted in the medial left hepatic lobe on an MRI 10/10/2012 -status post stereotactic radiosurgeryto the dominant liver metastasis in 3 fractions February 10 through 10/28/2012. 5-FU/Avastin was resumed on a 3 week schedule 12/03/2012. CEA in normal range on 12/03/2012 and 01/14/2013. Restaging MRI of the liver 02/21/2013 with a decrease in the dominant right hepatic lesion, stable medial left hepatic lobe lesion-but the left lesion is now causing obstruction of the left hepatic bile ducts. CT 06/23/2013 with a decrease in the right liver lesion, relief of the obstructed left biliary system, and no evidence of progressive metastatic disease. 3. Status post Port-A-Cath placement and Port-A-Cath removal by Dr. Michaell Cowing. A new Port-A-Cath was placed on 05/16/2011. The Port-A-Cath was removed due to malposition. A new Port-A-Cath was placed on  05/30/2011. 4. Spina bifida with severe scoliosis.  5. Chronic bilateral foot drop/lower leg and foot numbness. 6. Hypothyroidism. 7. History of chemotherapy-induced diarrhea. The bolus and infusional 5-FU were reduced by 25% beginning with cycle number 2 of FOLFOX. The 5-FU dose was decreased by an additional 10% beginning with cycle number 8. 8. History of delayed nausea following chemotherapy. 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 The oxaliplatin was dose reduced by 20% beginning with cycle number 6. The neuropathy symptoms resolved. 10. History of neutropenia secondary to chemotherapy. 11. Status post a colonoscopy 05/05/2010. There was a previous right colectomy anastomosis and slight nodularity. Biopsies were taken which showed benign colonic mucosa with hyperplastic epithelial changes. No significant inflammation, granuloma, or adenomatous epithelium was identified. 12. Diarrhea following FOLFIRI chemotherapy. Overall the diarrhea is controlled with Lomotil.  13. History of a fungal rash in the groin, improved with Diflucan and nystatin powder. 14. Mouth ulcers, oral candidiasis following cycle #6 of FOLFIRI-the ulcers resolved following treatment with Diflucan. She continues to note mild mouth ulcers following each treatment. She developed mucositis during infusional 5-fluorouracil/concurrent radiation given beginning on 04/07/2013. 15. Nausea during chemotherapy-Zofran was added to the chemotherapy regimen beginning on 07/09/2012. 16. Oozing at the gumline secondary to periodontal disease and Avastin 17. Jaundice secondary to biliary obstruction status post placement of a metallic biliary stent on 02/27/2013 without improvement in LFTs. Status post placement of a percutaneous biliary drain by interventional radiology on 03/13/2013. She underwent a cholangiogram on 03/21/2013 with evidence of a high-grade obstruction at the level of the previously placed metallic  biliary stent. The catheter could not be advanced. The external drainage catheter was upsized. Palliative radiation and infusional 5-FU given 04/07/2013 through 04/24/2013 in an attempt to shrink the liver metastasis so the biliary catheters can be advanced. Internalization of biliary drain on 04/30/2013; new external biliary drain in place 06/09/2013, biliary drain a change 07/21/2013 18. Mucositis secondary chemotherapy. Resolved.       19. Abdominal/back pain-etiology unclear. The pain could be related to unrecognized metastatic disease in the abdomen or retroperitoneum. I have a low clinical suspicion for metastatic disease involving the spine.      Disposition:  The bilirubin was lower on 07/21/2013, but is increased today. I contacted interventional radiology and they will evaluate the drain. The plan is to continue supportive care for the metastatic colon cancer. We will consider a restaging PET scan and CAPOX chemotherapy if the pain persist. She will return for an office and lab visit in 2 weeks.   Thornton Papas, MD  07/25/2013  11:56 AM

## 2013-07-28 ENCOUNTER — Ambulatory Visit (HOSPITAL_COMMUNITY)
Admission: RE | Admit: 2013-07-28 | Discharge: 2013-07-28 | Disposition: A | Discharge status: Home / Self Care | Payer: Medicare HMO | Source: Ambulatory Visit | Attending: Oncology | Admitting: Oncology

## 2013-07-28 ENCOUNTER — Other Ambulatory Visit: Payer: Self-pay | Admitting: Oncology

## 2013-07-28 ENCOUNTER — Telehealth: Payer: Self-pay | Admitting: *Deleted

## 2013-07-28 ENCOUNTER — Encounter (HOSPITAL_COMMUNITY): Payer: Self-pay

## 2013-07-28 DIAGNOSIS — E039 Hypothyroidism, unspecified: Secondary | ICD-10-CM | POA: Insufficient documentation

## 2013-07-28 DIAGNOSIS — K831 Obstruction of bile duct: Secondary | ICD-10-CM | POA: Insufficient documentation

## 2013-07-28 DIAGNOSIS — C787 Secondary malignant neoplasm of liver and intrahepatic bile duct: Secondary | ICD-10-CM | POA: Insufficient documentation

## 2013-07-28 DIAGNOSIS — Z79899 Other long term (current) drug therapy: Secondary | ICD-10-CM | POA: Insufficient documentation

## 2013-07-28 DIAGNOSIS — C189 Malignant neoplasm of colon, unspecified: Secondary | ICD-10-CM | POA: Insufficient documentation

## 2013-07-28 DIAGNOSIS — E785 Hyperlipidemia, unspecified: Secondary | ICD-10-CM | POA: Insufficient documentation

## 2013-07-28 DIAGNOSIS — R109 Unspecified abdominal pain: Secondary | ICD-10-CM | POA: Insufficient documentation

## 2013-07-28 DIAGNOSIS — R799 Abnormal finding of blood chemistry, unspecified: Secondary | ICD-10-CM | POA: Insufficient documentation

## 2013-07-28 DIAGNOSIS — Z87891 Personal history of nicotine dependence: Secondary | ICD-10-CM | POA: Insufficient documentation

## 2013-07-28 LAB — HEPATIC FUNCTION PANEL
ALT: 18 U/L (ref 0–35)
Alkaline Phosphatase: 213 U/L — ABNORMAL HIGH (ref 39–117)
Bilirubin, Direct: 4 mg/dL — ABNORMAL HIGH (ref 0.0–0.3)
Indirect Bilirubin: 1.5 mg/dL — ABNORMAL HIGH (ref 0.3–0.9)

## 2013-07-28 MED ORDER — PIPERACILLIN-TAZOBACTAM 3.375 G IVPB
3.3750 g | Freq: Once | INTRAVENOUS | Status: DC
  Filled 2013-07-28: qty 50

## 2013-07-28 MED ORDER — FENTANYL CITRATE 0.05 MG/ML IJ SOLN
INTRAMUSCULAR | Status: AC
  Filled 2013-07-28: qty 4

## 2013-07-28 MED ORDER — HEPARIN SOD (PORK) LOCK FLUSH 100 UNIT/ML IV SOLN
500.0000 [IU] | Freq: Once | INTRAVENOUS | Status: AC
  Administered 2013-07-28: 500 [IU] via INTRAVENOUS
  Filled 2013-07-28: qty 5

## 2013-07-28 MED ORDER — PIPERACILLIN-TAZOBACTAM 3.375 G IVPB 30 MIN
3.3750 g | Freq: Once | INTRAVENOUS | Status: AC
  Administered 2013-07-28: 3.375 g via INTRAVENOUS
  Filled 2013-07-28: qty 50

## 2013-07-28 MED ORDER — FENTANYL CITRATE 0.05 MG/ML IJ SOLN
INTRAMUSCULAR | Status: AC | PRN
  Administered 2013-07-28: 100 ug via INTRAVENOUS
  Administered 2013-07-28: 50 ug via INTRAVENOUS

## 2013-07-28 MED ORDER — MIDAZOLAM HCL 2 MG/2ML IJ SOLN
INTRAMUSCULAR | Status: AC | PRN
  Administered 2013-07-28: 2 mg via INTRAVENOUS
  Administered 2013-07-28: 1 mg via INTRAVENOUS

## 2013-07-28 MED ORDER — SODIUM CHLORIDE 0.9 % IV SOLN
INTRAVENOUS | Status: DC
  Administered 2013-07-28: 20 mL/h via INTRAVENOUS

## 2013-07-28 MED ORDER — MIDAZOLAM HCL 2 MG/2ML IJ SOLN
INTRAMUSCULAR | Status: AC
  Filled 2013-07-28: qty 4

## 2013-07-28 MED ORDER — IOHEXOL 300 MG/ML  SOLN
50.0000 mL | Freq: Once | INTRAMUSCULAR | Status: AC | PRN
  Administered 2013-07-28: 1 mL

## 2013-07-28 NOTE — Telephone Encounter (Signed)
Pt's husband walked in to office while pt was in radiology having stents evaluated. He reports pt is requesting PET scan to be done. Stated Dr. Truett Perna mentioned this in office visit, they would like to have this done. Reviewed with Dr. Truett Perna: Will wait to see how labs respond to stent replacement. Will discuss scans at next visit. Pt voiced understanding.

## 2013-07-28 NOTE — Procedures (Signed)
L 10 Fr biliary exchange No comp

## 2013-07-28 NOTE — H&P (Signed)
April Spence is an 57 y.o. female.   Chief Complaint: "I'm having my drain looked at" HPI: Patient with history of metastatic colon carcinoma,central biliary obstruction with prior placement of an internal/external biliary drain and increasing abd pain/ bilirubin presents today for cholangiogram with biliary drain exchange.   Past Medical History  Diagnosis Date  . colon ca dx'd 04/2009    chemo comp 11/2009.. colon and liver  . Spina bifida   . Scoliosis   . Hyperlipidemia   . Foot drop     bilateral,wears braces  . Mitral valve prolapse   . Hypothyroidism   . Depression   . History of chemotherapy     5-FU and Avastin 12 cycles FOLFOX  . Colon cancer 05/06/2010    metastatic  . Hepatic metastases 08/15/12    per ct abdomen  . Radiation 10/21/12-10/28/12    Palliative liver mets 54 gray in 3 fx's  . Arrhythmia 02-26-13    "rapid heart rate occ."  . History of radiation therapy 10/21/2012-10/28/2012    54 gray to liver metastasis  . History of radiation therapy 04/07/2013-04/24/2013    45 gray to liver metastasis    Past Surgical History  Procedure Laterality Date  . Cholecystectomy    . Back surgery  1980 and 2010  . Knee arthroscopy  2005    left  . Tubal ligation  1973  . Colonoscopy  05/05/2010  . Colon surgery      2010  . Portacath placement  05/30/2011    tip at cavoatrialjjunction by xray  . Ercp N/A 02/27/2013    Procedure: ENDOSCOPIC RETROGRADE CHOLANGIOPANCREATOGRAPHY (ERCP);  Surgeon: Rachael Fee, MD;  Location: [facility];  Service: Endoscopy;  Laterality: N/A;  . Biliary stent placement N/A 02/27/2013    Procedure: BILIARY STENT PLACEMENT;  Surgeon: Rachael Fee, MD;  Location: [facility];  Service: Endoscopy;  Laterality: N/A;  . Percutaneous transhepatic cholangiograhpy and billiary drainage  03/21/2013    Family History  Problem Relation Age of Onset  . Cancer Sister     breast  . Cancer Maternal Grandmother     breast  . Cancer Other 65     breast ca   Social History:  reports that she quit smoking about 7 years ago. She has never used smokeless tobacco. She reports that she does not drink alcohol or use illicit drugs.  Allergies:  Allergies  Allergen Reactions  . Adhesive [Tape]     Pt is fine with paper tape    Current outpatient prescriptions:ALPRAZolam (XANAX) 0.25 MG tablet, Take 0.25 mg by mouth 2 (two) times daily as needed for sleep or anxiety., Disp: , Rfl: ;  Alum & Mag Hydroxide-Simeth (MAGIC MOUTHWASH) SOLN, Take 10 mL by mouth 4 (four) times daily as needed. Mouthsores, Disp: , Rfl: ;  amitriptyline (ELAVIL) 25 MG tablet, Take 25 mg by mouth at bedtime. , Disp: , Rfl:  chlorhexidine (PERIDEX) 0.12 % solution, Use as directed 15 mLs in the mouth or throat 2 (two) times daily. , Disp: , Rfl: ;  diphenoxylate-atropine (LOMOTIL) 2.5-0.025 MG per tablet, Take 1 tablet by mouth 4 (four) times daily as needed for diarrhea or loose stools., Disp: , Rfl: ;  docusate sodium (COLACE) 100 MG capsule, Take 100 mg by mouth 2 (two) times daily. , Disp: , Rfl:  FLUoxetine (PROZAC) 20 MG capsule, Take 20 mg by mouth every morning. , Disp: , Rfl: ;  levothyroxine (SYNTHROID, LEVOTHROID) 150 MCG tablet, take 1  tablet by mouth once daily, Disp: 90 tablet, Rfl: PRN;  LORazepam (ATIVAN) 0.5 MG tablet, Take 1 tablet (0.5 mg total) by mouth every 12 (twelve) hours as needed for anxiety., Disp: 30 tablet, Rfl: 2 oxyCODONE-acetaminophen (PERCOCET/ROXICET) 5-325 MG per tablet, Take 1 tablet by mouth every 4 (four) hours as needed for moderate pain or severe pain., Disp: 100 tablet, Rfl: 0;  Polyethylene Glycol 3350 (MIRALAX PO), Take 17 g by mouth daily. Mix in 8 ounces fluid, Disp: , Rfl: ;  prochlorperazine (COMPAZINE) 10 MG tablet, Take 10 mg by mouth every 6 (six) hours as needed (nausea). , Disp: , Rfl:  propranolol (INDERAL) 80 MG tablet, Take 80 mg by mouth every morning. , Disp: , Rfl: ;  traMADol-acetaminophen (ULTRACET) 37.5-325 MG per  tablet, Take 1 tablet by mouth every 6 (six) hours as needed for pain. , Disp: , Rfl: ;  Cholecalciferol (VITAMIN D-3) 5000 UNITS TABS, Take 5,000 Units by mouth 2 (two) times daily. , Disp: , Rfl: ;  lidocaine-prilocaine (EMLA) cream, Apply 1 application topically as needed (use on port)., Disp: , Rfl:  nystatin (NYSTOP) 100000 UNIT/GM POWD, Apply 1 g topically daily as needed (for rash). In summer on irritated areas, Disp: , Rfl: ;  ondansetron (ZOFRAN) 8 MG tablet, Take 1 tablet (8 mg total) by mouth every 12 (twelve) hours as needed for nausea., Disp: 20 tablet, Rfl: 2 Current facility-administered medications:0.9 %  sodium chloride infusion, , Intravenous, Continuous, D Jeananne Rama, PA-C, Last Rate: 20 mL/hr at 07/28/13 1232, 20 mL/hr at 07/28/13 1232;  piperacillin-tazobactam (ZOSYN) IVPB 3.375 g, 3.375 g, Intravenous, Once, D Jeananne Rama, PA-C   Results for orders placed during the hospital encounter of 07/28/13 (from the past 48 hour(s))  HEPATIC FUNCTION PANEL     Status: Abnormal   Collection Time    07/28/13 12:06 PM      Result Value Range   Total Protein 8.3  6.0 - 8.3 g/dL   Albumin 2.4 (*) 3.5 - 5.2 g/dL   AST 31  0 - 37 U/L   ALT 18  0 - 35 U/L   Alkaline Phosphatase 213 (*) 39 - 117 U/L   Total Bilirubin 5.5 (*) 0.3 - 1.2 mg/dL   Bilirubin, Direct 4.0 (*) 0.0 - 0.3 mg/dL   Indirect Bilirubin 1.5 (*) 0.3 - 0.9 mg/dL   No results found.  Review of Systems  Constitutional: Negative for fever and chills.  Respiratory: Negative for cough and shortness of breath.   Cardiovascular: Negative for chest pain.  Gastrointestinal: Positive for nausea and abdominal pain. Negative for vomiting.  Musculoskeletal: Positive for back pain.  Neurological: Positive for weakness. Negative for headaches.  Endo/Heme/Allergies: Does not bruise/bleed easily.    Blood pressure 107/71, pulse 87, temperature 97.7 F (36.5 C), temperature source Oral, resp. rate 20, SpO2 97.00%. Physical Exam   Constitutional: She is oriented to person, place, and time. She appears well-developed and well-nourished.  Cardiovascular: Normal rate and regular rhythm.   Respiratory: Effort normal.  Sl dim BS bases  GI: Soft. Bowel sounds are normal. There is tenderness.  Clean, intact left lat biliary drain with sl turbid green bile in bag  Musculoskeletal:  Hx spina bifida, chronic bilateral foot drop  Neurological: She is alert and oriented to person, place, and time.     Assessment/Plan Pt with hx of metastatic colon carcinoma with central biliary obstruction, s/p I/E biliary drain; now with increasing abd pain/bilirubin. Plan is for cholangiogram with biliary drain  exchange today. Details/risks of procedure d/w pt/husband with their understanding and consent.  ALLRED,D KEVIN 07/28/2013, 1:25 PM

## 2013-08-04 ENCOUNTER — Other Ambulatory Visit: Payer: Self-pay | Admitting: *Deleted

## 2013-08-04 ENCOUNTER — Telehealth: Payer: Self-pay | Admitting: *Deleted

## 2013-08-04 ENCOUNTER — Telehealth: Payer: Self-pay | Admitting: Oncology

## 2013-08-04 DIAGNOSIS — C189 Malignant neoplasm of colon, unspecified: Secondary | ICD-10-CM

## 2013-08-04 NOTE — Telephone Encounter (Signed)
Calling to request MD go ahead and schedule the PET scan. Her pain is getting "rough". Harder to sleep and even eat now due to the pain. Dr. Truett Perna notified and agrees to scan.

## 2013-08-04 NOTE — Telephone Encounter (Signed)
1st available PET scan in [facility] is 08/20/13-could try [facility]. OK to have PET at [facility] per Dr. Truett Perna. Scheduling department notified.

## 2013-08-04 NOTE — Telephone Encounter (Signed)
Called pt and left message regarding PET Scan @ [facility] 08/12/13 1245pm

## 2013-08-05 ENCOUNTER — Telehealth: Payer: Self-pay | Admitting: *Deleted

## 2013-08-05 ENCOUNTER — Telehealth: Payer: Self-pay | Admitting: Oncology

## 2013-08-05 NOTE — Telephone Encounter (Signed)
Asking to be seen by Dr. Truett Perna this week. Her abdominal pain is getting worse and she can't wait till Dec 1st. MD notified and she will be seen tomorrow at 0830. Patient agrees.

## 2013-08-05 NOTE — Telephone Encounter (Signed)
, °

## 2013-08-06 ENCOUNTER — Telehealth: Payer: Self-pay | Admitting: *Deleted

## 2013-08-06 ENCOUNTER — Ambulatory Visit (HOSPITAL_BASED_OUTPATIENT_CLINIC_OR_DEPARTMENT_OTHER): Payer: Medicare HMO | Admitting: Oncology

## 2013-08-06 ENCOUNTER — Telehealth: Payer: Self-pay | Admitting: Oncology

## 2013-08-06 VITALS — BP 124/74 | HR 98 | Temp 98.8°F | Resp 20 | Ht 60.0 in | Wt 125.5 lb

## 2013-08-06 DIAGNOSIS — R109 Unspecified abdominal pain: Secondary | ICD-10-CM

## 2013-08-06 DIAGNOSIS — C189 Malignant neoplasm of colon, unspecified: Secondary | ICD-10-CM

## 2013-08-06 DIAGNOSIS — M549 Dorsalgia, unspecified: Secondary | ICD-10-CM

## 2013-08-06 DIAGNOSIS — C180 Malignant neoplasm of cecum: Secondary | ICD-10-CM

## 2013-08-06 DIAGNOSIS — C787 Secondary malignant neoplasm of liver and intrahepatic bile duct: Secondary | ICD-10-CM

## 2013-08-06 MED ORDER — MORPHINE SULFATE ER 30 MG PO TBCR: 30.0000 mg | EXTENDED_RELEASE_TABLET | Freq: Two times a day (BID) | ORAL | Status: DC

## 2013-08-06 NOTE — Progress Notes (Signed)
[facility]    OFFICE PROGRESS NOTE   INTERVAL HISTORY:   She returns prior to a scheduled visit. She underwent exchange of the biliary drain in interventional radiology on 07/29/2013. She has noted less jaundice and the urine is lighter in color.  April Spence complains of increased pain at the right abdomen and right back. The pain is partially relieved with Percocet and Ultracet. She has constipation. Intermittent nausea.  Objective:  Vital signs in last 24 hours:  Blood pressure 124/74, pulse 98, temperature 98.8 F (37.1 C), temperature source Oral, resp. rate 20, height 5' (1.524 m), weight 125 lb 8 oz (56.926 kg).    HEENT: Mild scleral and subungual icterus Resp: Decreased breath sounds with coarse rhonchi throughout the right posterior chest, no respiratory distress Cardio: Regular rate and rhythm GI: Tender in the right subcostal region and left upper abdomen near the drain site. Biliary drain site without erythema. No mass. Vascular: No leg edema    Portacath/PICC-without erythema  Lab Results:  Lab Results  Component Value Date   WBC 8.5 07/21/2013   HGB 11.8* 07/21/2013   HCT 36.0 07/21/2013   MCV 97.6 07/21/2013   PLT 183 07/21/2013      Medications: I have reviewed the patient's current medications.  Assessment/Plan: 1. 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 She completed 12 cycles of FOLFOX chemotherapy. Oxaliplatin was held with cycles number 5, 8, 11, and 12. 2. Metastatic colon cancer confirmed on a restaging CT evaluation 05/02/2011 with liver metastases and periportal lymphadenopathy. She began treatment with FOLFIRI on 06/01/2011. Avastin was added beginning with cycle number 2. She completed cycle #6 08/08/2011. Restaging CT evaluation 08/15/2011 showed improvement in the liver metastases and porta hepatis  lymphadenopathy. She completed cycle #12 FOLFIRI/Avastin 11/07/2011. Restaging CT evaluation 11/24/2011 showed decrease in size of hepatic metastases and no evidence of disease progression or new metastasis. She completed cycle 18 on 03/11/2012. A restaging CT 03/29/2012 revealed stable disease. Irinotecan was discontinued and treatment continued with 5-FU/leucovorin and Avastin on a 3 week schedule beginning 04/02/2012. -restaging CT 08/15/2012 with slight enlargement of the central liver lesion and progression of intrahepatic biliary dilatation, no other liver lesions and no other evidence of progressive metastatic disease. CEA stable on 08/20/2012. Treatment continued with 5-FU and Avastin on a 3 week schedule.restaging CT 09/30/2012 found the dominant central right liver metastasis to be slightly larger with progressive intrahepatic biliary dilatation, a previously described left hepatic lobe lesion appeared smaller measuring 10 mm and appeared separate from the liver and is felt to most likely represent a porta hepatis lymph node.A. Suspected 9 mm metastasis was noted in the medial left hepatic lobe on an MRI 10/10/2012 -status post stereotactic radiosurgeryto the dominant liver metastasis in 3 fractions February 10 through 10/28/2012. 5-FU/Avastin was resumed on a 3 week schedule 12/03/2012. CEA in normal range on 12/03/2012 and 01/14/2013. Restaging MRI of the liver 02/21/2013 with a decrease in the dominant right hepatic lesion, stable medial left hepatic lobe lesion-but the left lesion is now causing obstruction of the left hepatic bile ducts. CT 06/23/2013 with a decrease in the right liver lesion, relief of the obstructed left biliary system, and no evidence of progressive metastatic disease. 3. Status post Port-A-Cath placement and Port-A-Cath removal by Dr. Michaell Cowing. A new Port-A-Cath was placed on 05/16/2011. The Port-A-Cath was removed due  to malposition. A new Port-A-Cath was placed on  05/30/2011. 4. Spina bifida with severe scoliosis.  5. Chronic bilateral foot drop/lower leg and foot numbness. 6. Hypothyroidism. 7. History of chemotherapy-induced diarrhea. The bolus and infusional 5-FU were reduced by 25% beginning with cycle number 2 of FOLFOX. The 5-FU dose was decreased by an additional 10% beginning with cycle number 8. 8. History of delayed nausea following chemotherapy. 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 The oxaliplatin was dose reduced by 20% beginning with cycle number 6. The neuropathy symptoms resolved. 10. History of neutropenia secondary to chemotherapy. 11. Status post a colonoscopy 05/05/2010. There was a previous right colectomy anastomosis and slight nodularity. Biopsies were taken which showed benign colonic mucosa with hyperplastic epithelial changes. No significant inflammation, granuloma, or adenomatous epithelium was identified. 12. Diarrhea following FOLFIRI chemotherapy. Overall the diarrhea is controlled with Lomotil.  13. History of a fungal rash in the groin, improved with Diflucan and nystatin powder. 14. Mouth ulcers, oral candidiasis following cycle #6 of FOLFIRI-the ulcers resolved following treatment with Diflucan. She continues to note mild mouth ulcers following each treatment. She developed mucositis during infusional 5-fluorouracil/concurrent radiation given beginning on 04/07/2013. 15. Nausea during chemotherapy-Zofran was added to the chemotherapy regimen beginning on 07/09/2012. 16. Oozing at the gumline secondary to periodontal disease and Avastin 17. Jaundice secondary to biliary obstruction status post placement of a metallic biliary stent on 02/27/2013 without improvement in LFTs. Status post placement of a percutaneous biliary drain by interventional radiology on 03/13/2013. She underwent a cholangiogram on 03/21/2013 with evidence of a high-grade obstruction at the level of the previously placed metallic  biliary stent. The catheter could not be advanced. The external drainage catheter was upsized. Palliative radiation and infusional 5-FU given 04/07/2013 through 04/24/2013 in an attempt to shrink the liver metastasis so the biliary catheters can be advanced. Internalization of biliary drain on 04/30/2013; new external biliary drain in place 06/09/2013, biliary drain exchange 07/21/2013 and 07/29/2013 18. Mucositis secondary chemotherapy. Resolved. 19. Abdominal/back pain-etiology unclear. The pain could be related to unrecognized metastatic disease in the abdomen or retroperitoneum. I have a low clinical suspicion for metastatic disease involving the spine. The pain has progressed.    Disposition:  Ms. Dessert has increased pain. The etiology of the pain is unclear, but I suspect this is secondary to progression of the metastatic colon cancer. The pain did not improve with exchange of the biliary drain. We increased the MS Contin to 30 every 12 hours. She will take 2 Percocet every 4 hours as needed for breakthrough pain. She will increase the MiraLAX to daily. She is scheduled for a PET scan on 08/12/2013. Ms. Cuervo will return for an office visit on 08/14/2013. She will contact us in the interim if the current narcotic regimen does not relieve her pain.  Thornton Papas, MD  08/06/2013  12:40 PM

## 2013-08-06 NOTE — Telephone Encounter (Signed)
pt here I sw Kellie in PET at [facility] and verified pt is scheduled for 08/12/13 at 1pm Arrival time 12:45 Pt aware shh

## 2013-08-06 NOTE — Telephone Encounter (Signed)
Instructed patient to keep her PET scan for 12/2 at [facility] as scheduled. Will move 12/1 visit to 12/4 at 2:15/2:45 with NP. She understands and agrees. Will also send appointment via My Chart.

## 2013-08-11 ENCOUNTER — Ambulatory Visit: Payer: Medicare HMO | Admitting: Nurse Practitioner

## 2013-08-11 ENCOUNTER — Other Ambulatory Visit: Payer: Medicare HMO | Admitting: Lab

## 2013-08-12 ENCOUNTER — Ambulatory Visit: Payer: Self-pay | Admitting: Oncology

## 2013-08-13 ENCOUNTER — Telehealth: Payer: Self-pay | Admitting: Internal Medicine

## 2013-08-13 ENCOUNTER — Other Ambulatory Visit: Payer: Self-pay | Admitting: Internal Medicine

## 2013-08-13 DIAGNOSIS — L89303 Pressure ulcer of unspecified buttock, stage 3: Secondary | ICD-10-CM

## 2013-08-13 NOTE — Telephone Encounter (Signed)
Per Mck, pt is to keep back clean and to keep mystatin cream on the sore. No abx was called in. Pt's husband aware.

## 2013-08-13 NOTE — Telephone Encounter (Signed)
Pt's husband called=pt is getting a bed sore on her back side=will we call in abx? Looks like its getting infected=also they have been using nystatin cream on it=is that ok??? Pharm they use id [facility] on [address] Please call back at [phone] or [phone] Sending chart back

## 2013-08-14 ENCOUNTER — Other Ambulatory Visit (HOSPITAL_BASED_OUTPATIENT_CLINIC_OR_DEPARTMENT_OTHER): Payer: Medicare HMO | Admitting: Lab

## 2013-08-14 ENCOUNTER — Telehealth: Payer: Self-pay | Admitting: Nurse Practitioner

## 2013-08-14 ENCOUNTER — Ambulatory Visit (HOSPITAL_BASED_OUTPATIENT_CLINIC_OR_DEPARTMENT_OTHER): Payer: Medicare HMO | Admitting: Nurse Practitioner

## 2013-08-14 VITALS — BP 97/61 | HR 81 | Temp 98.6°F | Resp 20 | Ht 60.0 in | Wt 125.5 lb

## 2013-08-14 DIAGNOSIS — C189 Malignant neoplasm of colon, unspecified: Secondary | ICD-10-CM

## 2013-08-14 DIAGNOSIS — C787 Secondary malignant neoplasm of liver and intrahepatic bile duct: Secondary | ICD-10-CM

## 2013-08-14 DIAGNOSIS — C180 Malignant neoplasm of cecum: Secondary | ICD-10-CM

## 2013-08-14 DIAGNOSIS — R109 Unspecified abdominal pain: Secondary | ICD-10-CM

## 2013-08-14 DIAGNOSIS — M549 Dorsalgia, unspecified: Secondary | ICD-10-CM

## 2013-08-14 LAB — COMPREHENSIVE METABOLIC PANEL (CC13)
ALT: 16 U/L (ref 0–55)
AST: 35 U/L — ABNORMAL HIGH (ref 5–34)
Alkaline Phosphatase: 349 U/L — ABNORMAL HIGH (ref 40–150)
CO2: 26 mEq/L (ref 22–29)
Sodium: 136 mEq/L (ref 136–145)
Total Bilirubin: 2.1 mg/dL — ABNORMAL HIGH (ref 0.20–1.20)
Total Protein: 7.1 g/dL (ref 6.4–8.3)

## 2013-08-14 NOTE — Telephone Encounter (Signed)
appts made per 12/4 POF AVS and CAL given shh °

## 2013-08-14 NOTE — Progress Notes (Addendum)
OFFICE PROGRESS NOTE  Interval history:  She returns for followup of metastatic colon cancer. She notes improved pain control with the increased dose of MS Contin. She is taking Percocet approximately 2 times a day. Bowels moving with the aid of a stool softener and laxative. Nausea is better. Appetite also better. She thinks her urine is lighter. Her husband thinks she is less jaundiced.   Objective: Blood pressure 97/61, pulse 81, temperature 98.6 F (37 C), temperature source Oral, resp. rate 20, height 5' (1.524 m), weight 125 lb 8 oz (56.926 kg).  No thrush or ulcerations. Lungs clear. Regular cardiac rhythm. Port-A-Cath site without erythema. Abdomen soft and nontender. Biliary drain site without erythema. No leg edema. Superficial skin ulceration at the left lower buttock/upper thigh.  Lab Results: Lab Results  Component Value Date   WBC 8.5 07/21/2013   HGB 11.8* 07/21/2013   HCT 36.0 07/21/2013   MCV 97.6 07/21/2013   PLT 183 07/21/2013    Chemistry:    Chemistry      Component Value Date/Time   NA 136 08/14/2013 1415   NA 135 07/21/2013 0800   NA 136 05/02/2011 0954   K 4.0 08/14/2013 1415   K 3.8 07/21/2013 0800   K 4.5 05/02/2011 0954   CL 101 07/21/2013 0800   CL 99 02/25/2013 1124   CL 93* 05/02/2011 0954   CO2 26 08/14/2013 1415   CO2 27 07/21/2013 0800   CO2 29 05/02/2011 0954   BUN 8.0 08/14/2013 1415   BUN 13 07/21/2013 0800   BUN 12 05/02/2011 0954   CREATININE 0.6 08/14/2013 1415   CREATININE 0.35* 07/21/2013 0800   CREATININE 0.5* 05/02/2011 0954      Component Value Date/Time   CALCIUM 9.2 08/14/2013 1415   CALCIUM 9.7 07/21/2013 0800   CALCIUM 9.3 05/02/2011 0954   ALKPHOS 349* 08/14/2013 1415   ALKPHOS 213* 07/28/2013 1206   ALKPHOS 78 05/02/2011 0954   AST 35* 08/14/2013 1415   AST 31 07/28/2013 1206   AST 31 05/02/2011 0954   ALT 16 08/14/2013 1415   ALT 18 07/28/2013 1206   ALT 26 05/02/2011 0954   BILITOT 2.10* 08/14/2013 1415   BILITOT 5.5*  07/28/2013 1206   BILITOT 0.40 05/02/2011 0954       Studies/Results: Ir Catheter Tube Change  07/29/2013   CLINICAL DATA:  Increasing bilirubin level  EXAM: IR CATHETER TUBE CHANGE  MEDICATIONS AND MEDICAL HISTORY: Versed 3 mg, Fentanyl 150 mcg.  As antibiotic prophylaxis, Zosyn was ordered pre-procedure and administered intravenously within one hour of incision.  ANESTHESIA/SEDATION: Moderate sedation time: 15 minutes  CONTRAST:  15 cc Omnipaque 300  FLUOROSCOPY TIME:  1 min and 9 seconds.  PROCEDURE: The procedure, risks, benefits, and alternatives were explained to the patient. Questions regarding the procedure were encouraged and answered. The patient understands and consents to the procedure.  The epigastrium was prepped with Betadine in a sterile fashion, and a sterile drape was applied covering the operative field. A sterile gown and sterile gloves were used for the procedure.  The existing biliary drain was cut and exchanged over a [place] wire for a new 10 [place] biliary drain. Several holes were enlarged. Three additional holes above the marker made. The tip was coiled in the intestine. Contrast was injected. It was string fixed and sewn to the skin.  COMPLICATIONS: None  FINDINGS: The tip of the new catheter is coiled in the intestine. The catheter is widely patent.  IMPRESSION: Successful  exchange of the catheter with enlargement of sideholes and additional sideholes. The drain will be capped.   Electronically Signed   By: Maryclare Bean M.D.   On: 07/29/2013 10:43   Ir Catheter Tube Change  07/21/2013   CLINICAL DATA:  Malignant biliary obstruction, chronic internal external left biliary drain  EXAM: Fluoroscopic left internal external biliary drain exchange  Date:  11/10/201411/06/2013 10:21 AM  Radiologist:  Judie Petit. Ruel Favors, MD  Guidance:  Fluoroscopic  MEDICATIONS AND MEDICAL HISTORY: 3 mg Versed, 150 mcg fentanyl, 3.375 g Zosyn administered within 1 hr of the procedure  ANESTHESIA/SEDATION: 15  min  CONTRAST:  15 cc Omnipaque 300  FLUOROSCOPY TIME:  2 min 36 seconds  PROCEDURE: Informed consent was obtained from the patient following explanation of the procedure, risks, benefits and alternatives. The patient understands, agrees and consents for the procedure. All questions were addressed. A time out was performed.  Maximal barrier sterile technique utilized including caps, mask, sterile gowns, sterile gloves, large sterile drape, hand hygiene, and Betadine  Under sterile conditions, the existing catheter was injected to confirm position within the biliary tract. Under sterile conditions and local anesthesia, the catheter was cut and removed over a Glidewire. A new 10 French drain was advanced over the glidewire with the retention loop formed in the duodenum. CONTRAST injection confirms patency and position. Catheter secured with a Prolene suture and capped externally. No immediate complication. Patient tolerated the procedure well.  COMPLICATIONS: No immediate  IMPRESSION: Successful fluoroscopic left internal external biliary drain exchange   Electronically Signed   By: Ruel Favors M.D.   On: 07/21/2013 11:08   Ir Radiologist Eval & Mgmt  08/11/2013   CONSULTATION: Mrs. Hanssen is a 57 year old female with a history of an internal external biliary drain. This was just exchanged on 11/10 without difficulty. The patient began having some mild ileus drainage leaking from around the site. We requested that she come in for evaluation.  On exam, the biliary drain site does indeed have some bilious drainage on the dressing. The decision was made to hook the tube to a bag for gravity dependent drainage. Bilious output was noted in the tubing after connected to the bag.  We instructed the patient to follow-up up with her oncologist to have liver enzymes checked and to contact our department if no sufficient drainage or increased pain from the drain site occurs.  Read by: Brayton El PA-C   Electronically  Signed   By: Irish Lack M.D.   On: 08/08/2013 13:00    Medications: I have reviewed the patient's current medications.  Assessment/Plan:  1. 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 She completed 12 cycles of FOLFOX chemotherapy. Oxaliplatin was held with cycles number 5, 8, 11, and 12. 2. Metastatic colon cancer confirmed on a restaging CT evaluation 05/02/2011 with liver metastases and periportal lymphadenopathy. She began treatment with FOLFIRI on 06/01/2011. Avastin was added beginning with cycle number 2. She completed cycle #6 08/08/2011. Restaging CT evaluation 08/15/2011 showed improvement in the liver metastases and porta hepatis lymphadenopathy. She completed cycle #12 FOLFIRI/Avastin 11/07/2011. Restaging CT evaluation 11/24/2011 showed decrease in size of hepatic metastases and no evidence of disease progression or new metastasis. She completed cycle 18 on 03/11/2012. A restaging CT 03/29/2012 revealed stable disease. Irinotecan was discontinued and treatment continued with 5-FU/leucovorin and Avastin on a 3 week  schedule beginning 04/02/2012. -restaging CT 08/15/2012 with slight enlargement of the central liver lesion and progression of intrahepatic biliary dilatation, no other liver lesions and no other evidence of progressive metastatic disease. CEA stable on 08/20/2012. Treatment continued with 5-FU and Avastin on a 3 week schedule.restaging CT 09/30/2012 found the dominant central right liver metastasis to be slightly larger with progressive intrahepatic biliary dilatation, a previously described left hepatic lobe lesion appeared smaller measuring 10 mm and appeared separate from the liver and is felt to most likely represent a porta hepatis lymph node.A. Suspected 9 mm metastasis was noted in the medial left hepatic lobe on an MRI 10/10/2012 -status post stereotactic  radiosurgeryto the dominant liver metastasis in 3 fractions February 10 through 10/28/2012. 5-FU/Avastin was resumed on a 3 week schedule 12/03/2012. CEA in normal range on 12/03/2012 and 01/14/2013. Restaging MRI of the liver 02/21/2013 with a decrease in the dominant right hepatic lesion, stable medial left hepatic lobe lesion-but the left lesion is now causing obstruction of the left hepatic bile ducts. CT 06/23/2013 with a decrease in the right liver lesion, relief of the obstructed left biliary system, and no evidence of progressive metastatic disease. PET scan 08/12/2013 with a large area of hypermetabolism in the central aspect of the liver compatible with residual metastasis. No other definite signs of metastatic disease. Diffusely increased activity in the thyroid gland. 3. Status post Port-A-Cath placement and Port-A-Cath removal by Dr. Michaell Cowing. A new Port-A-Cath was placed on 05/16/2011. The Port-A-Cath was removed due to malposition. A new Port-A-Cath was placed on 05/30/2011. 4. Spina bifida with severe scoliosis.  5. Chronic bilateral foot drop/lower leg and foot numbness. 6. Hypothyroidism. 7. History of chemotherapy-induced diarrhea. The bolus and infusional 5-FU were reduced by 25% beginning with cycle number 2 of FOLFOX. The 5-FU dose was decreased by an additional 10% beginning with cycle number 8. 8. History of delayed nausea following chemotherapy. 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 The oxaliplatin was dose reduced by 20% beginning with cycle number 6. The neuropathy symptoms resolved. 10. History of neutropenia secondary to chemotherapy. 11. Status post a colonoscopy 05/05/2010. There was a previous right colectomy anastomosis and slight nodularity. Biopsies were taken which showed benign colonic mucosa with hyperplastic epithelial changes. No significant inflammation, granuloma, or adenomatous epithelium was identified. 12. Diarrhea following FOLFIRI  chemotherapy. Overall the diarrhea is controlled with Lomotil.  13. History of a fungal rash in the groin, improved with Diflucan and nystatin powder. 14. Mouth ulcers, oral candidiasis following cycle #6 of FOLFIRI-the ulcers resolved following treatment with Diflucan. She continues to note mild mouth ulcers following each treatment. She developed mucositis during infusional 5-fluorouracil/concurrent radiation given beginning on 04/07/2013. 15. Nausea during chemotherapy-Zofran was added to the chemotherapy regimen beginning on 07/09/2012. 16. Oozing at the gumline secondary to periodontal disease and Avastin 17. Jaundice secondary to biliary obstruction status post placement of a metallic biliary stent on 02/27/2013 without improvement in LFTs. Status post placement of a percutaneous biliary drain by interventional radiology on 03/13/2013. She underwent a cholangiogram on 03/21/2013 with evidence of a high-grade obstruction at the level of the previously placed metallic biliary stent. The catheter could not be advanced. The external drainage catheter was upsized. Palliative radiation and infusional 5-FU given 04/07/2013 through 04/24/2013 in an attempt to shrink the liver metastasis so the biliary catheters can be advanced. Internalization of biliary drain on 04/30/2013; new external biliary drain in place 06/09/2013, biliary drain exchange 07/21/2013 and 07/29/2013. 18. Mucositis secondary chemotherapy.  Resolved. 19. Abdominal/back pain. Etiology unclear. Improved pain control with increased dose of MS Contin. 20. Superficial pressure ulcer left lower buttock/upper thigh. The ulcer does not appear grossly infected. She reports a home skin care nurse will be evaluating the ulcer.  Disposition-she notes improved pain control. Dr. Truett Perna reviewed the PET scan findings with Ms. Bischoff and her husband. The etiology of the pain remains unclear. Question if related to metastatic disease involving the liver,  question unrecognized metastatic disease, question scoliosis/arthritis. It was mutually decided to continue to follow on an observation approach as her pain is better. She will return for a followup visit in approximately one month. She knows to contact the office in the interim with increased pain or new symptoms.  Patient seen with Dr. Truett Perna.  25 minutes were spent face-to-face at today's visit with the majority of that time involved in counseling/coordination of care.    Lonna Cobb ANP/GNP-BC   This was a shared visit with Lonna Cobb. Her pain is improved with the increased dose of MS Contin. We reviewed the PET scan findings with [name] and her husband. The etiology of her pain remains unclear. I think it is unlikely the pain is related to the single liver lesion. The pain could be due to the severe scoliosis. It is also possible the pain is related to unrecognized metastatic disease.  We decided to follow her with observation. I will present her case at the GI tumor conference on 08/20/2013. Ms. Wilcock will return for an office visit on 09/17/2013. She will contact us in the interim for increased pain.  Mancel Bale, M.D.

## 2013-08-15 ENCOUNTER — Telehealth: Payer: Self-pay | Admitting: *Deleted

## 2013-08-15 NOTE — Telephone Encounter (Signed)
Call from pt reporting she has a cracked tooth, scheduled appt with dentist 08/18/13. Asking if OK with Dr. Truett Perna to keep this appt? YES. She voiced understanding.

## 2013-08-18 ENCOUNTER — Other Ambulatory Visit: Payer: Self-pay | Admitting: *Deleted

## 2013-08-18 ENCOUNTER — Telehealth: Payer: Self-pay | Admitting: Internal Medicine

## 2013-08-18 DIAGNOSIS — C189 Malignant neoplasm of colon, unspecified: Secondary | ICD-10-CM

## 2013-08-18 DIAGNOSIS — C787 Secondary malignant neoplasm of liver and intrahepatic bile duct: Secondary | ICD-10-CM

## 2013-08-18 MED ORDER — OXYCODONE-ACETAMINOPHEN 5-325 MG PO TABS: 2.0000 | ORAL_TABLET | ORAL | Status: DC | PRN

## 2013-08-18 NOTE — Telephone Encounter (Signed)
Called [facility] and left message with coordinator to return my call about status. Pt aware.

## 2013-08-18 NOTE — Telephone Encounter (Signed)
Pt said that you told her to call if  wound care people did not call her, please advise

## 2013-08-18 NOTE — Telephone Encounter (Signed)
Mardella Layman - what's the status of this referral

## 2013-08-18 NOTE — Telephone Encounter (Signed)
Notified pt per request---pain script will be ready for pick-up 08/19/13 @ her convenience.  Pt verbalized understanding and expressed appreciation for call back.

## 2013-08-19 ENCOUNTER — Other Ambulatory Visit: Payer: Self-pay | Admitting: Physician Assistant

## 2013-08-21 DIAGNOSIS — L8992 Pressure ulcer of unspecified site, stage 2: Secondary | ICD-10-CM

## 2013-08-21 DIAGNOSIS — C787 Secondary malignant neoplasm of liver and intrahepatic bile duct: Secondary | ICD-10-CM

## 2013-08-21 DIAGNOSIS — C189 Malignant neoplasm of colon, unspecified: Secondary | ICD-10-CM

## 2013-08-21 DIAGNOSIS — L89309 Pressure ulcer of unspecified buttock, unspecified stage: Secondary | ICD-10-CM

## 2013-08-25 ENCOUNTER — Other Ambulatory Visit (HOSPITAL_COMMUNITY): Payer: Medicare HMO

## 2013-08-25 ENCOUNTER — Ambulatory Visit (HOSPITAL_COMMUNITY): Payer: Medicare HMO

## 2013-08-29 ENCOUNTER — Encounter (HOSPITAL_COMMUNITY): Payer: Self-pay | Admitting: Pharmacy Technician

## 2013-09-01 ENCOUNTER — Inpatient Hospital Stay (HOSPITAL_COMMUNITY): Admission: RE | Admit: 2013-09-01 | Payer: Medicare HMO | Source: Ambulatory Visit

## 2013-09-01 ENCOUNTER — Encounter: Payer: Self-pay | Admitting: Physician Assistant

## 2013-09-01 ENCOUNTER — Other Ambulatory Visit (HOSPITAL_COMMUNITY): Payer: Medicare HMO

## 2013-09-01 ENCOUNTER — Ambulatory Visit: Payer: Medicare HMO | Admitting: Physician Assistant

## 2013-09-01 VITALS — BP 102/62 | HR 78 | Temp 97.7°F | Resp 16 | Ht 60.0 in | Wt 125.0 lb

## 2013-09-01 DIAGNOSIS — L8993 Pressure ulcer of unspecified site, stage 3: Secondary | ICD-10-CM

## 2013-09-01 MED ORDER — DOXYCYCLINE HYCLATE 100 MG PO TABS: 100.0000 mg | ORAL_TABLET | Freq: Two times a day (BID) | ORAL | Status: DC

## 2013-09-01 NOTE — Progress Notes (Signed)
Subjective:    Patient ID: April Spence, female    DOB: 03/03/1949, 57 y.o.   MRN: [id number]  HPI Patient is wheel chair bound and states she has had a pressure ulcer on her left cheek for 2 weeks. It has a clear drainage, no warmth. She denies fever, chills.   Current Outpatient Prescriptions on File Prior to Visit  Medication Sig Dispense Refill  . ALPRAZolam (XANAX) 0.25 MG tablet Take 0.25 mg by mouth 2 (two) times daily as needed for sleep or anxiety.      . Alum & Mag Hydroxide-Simeth (MAGIC MOUTHWASH) SOLN Take 10 mLs by mouth 4 (four) times daily as needed for mouth pain. Mouthsores      . amitriptyline (ELAVIL) 25 MG tablet Take 25 mg by mouth at bedtime.       . chlorhexidine (PERIDEX) 0.12 % solution Use as directed 15 mLs in the mouth or throat 2 (two) times daily.       . Cholecalciferol (VITAMIN D-3) 5000 UNITS TABS Take 5,000 Units by mouth 2 (two) times daily.       . diphenoxylate-atropine (LOMOTIL) 2.5-0.025 MG per tablet Take 1 tablet by mouth 4 (four) times daily as needed for diarrhea or loose stools.      . docusate sodium (COLACE) 100 MG capsule Take 100 mg by mouth 2 (two) times daily.       Marland Kitchen FLUoxetine (PROZAC) 20 MG capsule Take 20 mg by mouth every morning.       . lidocaine-prilocaine (EMLA) cream Apply 1 application topically as needed (use on port).      . LORazepam (ATIVAN) 0.5 MG tablet Take 1 tablet (0.5 mg total) by mouth every 12 (twelve) hours as needed for anxiety.  30 tablet  2  . nystatin (NYSTOP) 100000 UNIT/GM POWD Apply 1 g topically daily as needed (for rash). In summer on irritated areas      . ondansetron (ZOFRAN) 8 MG tablet Take 1 tablet (8 mg total) by mouth every 12 (twelve) hours as needed for nausea.  20 tablet  2  . Polyethylene Glycol 3350 (MIRALAX PO) Take 17 g by mouth daily. Mix in 8 ounces fluid      . prochlorperazine (COMPAZINE) 10 MG tablet Take 10 mg by mouth every 6 (six) hours as needed (nausea).       . propranolol (INDERAL)  80 MG tablet Take 80 mg by mouth every morning.       . traMADol-acetaminophen (ULTRACET) 37.5-325 MG per tablet Take 1 tablet by mouth every 6 (six) hours as needed for moderate pain.        No current facility-administered medications on file prior to visit.   Past Medical History  Diagnosis Date  . colon ca dx'd 04/2009    chemo comp 11/2009.. colon and liver  . Spina bifida   . Scoliosis   . Hyperlipidemia   . Foot drop     bilateral,wears braces  . Mitral valve prolapse   . Hypothyroidism   . Depression   . History of chemotherapy     5-FU and Avastin 12 cycles FOLFOX  . Colon cancer 05/06/2010    metastatic  . Hepatic metastases 08/15/12    per ct abdomen  . Radiation 10/21/12-10/28/12    Palliative liver mets 54 gray in 3 fx's  . Arrhythmia 02-26-13    "rapid heart rate occ."  . History of radiation therapy 10/21/2012-10/28/2012    54 gray to liver metastasis  .  History of radiation therapy 04/07/2013-04/24/2013    45 gray to liver metastasis    Review of Systems General ROS: negative for - chills, fatigue, fever or malaise Respiratory ROS: no cough, shortness of breath, or wheezing Cardiovascular ROS: no chest pain or dyspnea on exertion Dermatological ROS: positive for pressure ulcer on left buttocks    Objective:   Physical Exam  Constitutional: She appears well-developed and well-nourished.  HENT:  Head: Normocephalic and atraumatic.  Eyes: Conjunctivae are normal. Pupils are equal, round, and reactive to light.  Neck: Normal range of motion. Neck supple.  Cardiovascular: Normal rate, S1 normal and S2 normal.  Exam reveals gallop and S3.   Pulmonary/Chest: Effort normal and breath sounds normal.  Abdominal: Soft. Bowel sounds are normal.  Musculoskeletal:  In wheelchair  Skin: Skin is warm and dry.  Left lower buttocks, two lesions with white eschar, mild erythema surrounding them, no warmth or discharge. One lesion 1x1.5cm and the other 0.5x0.5cm.        Assessment & Plan:  Pressure ulcer- It is taxing effort for the patient to leave her home since she is wheel chair bound so we will send out home health for wound care.  Try to change positions Does not look infected at this time but since we are going into the holidays patient was given an ABX with instructions on when to use it.

## 2013-09-01 NOTE — Patient Instructions (Signed)
At this time it does not look infected however since we are going into the holiday i will give you an antibiotic to have in case you need it. Please take the antibiotic if you get any of the following symptoms.  Warmth, redness, if the discharge turns into a white/pus discharge, fever or chills.    Pressure Ulcer A pressure ulcer is a sore that has formed from the break down of skin and exposure of deeper layers of tissue. It develops in areas of the body where there is unrelieved pressure. Pressure ulcers are usually found over a boney area, such as the shoulder blades, spine, lower back, hips, knees, ankles, and heels. RISK FACTORS  Decreased ability to move.  Decreased ability to feel pain or discomfort.  Excessive skin moisture from urine, stool, sweat, or secretions.  Poor nutrition.  Dehydration.  Tobacco, drug, or alcohol abuse.  Pulling sheets that are under a patient when changing his or her position.  Obesity.  Increased adult age.  Age of less than 2 years.  Premature newborns.  Hospitalization in a critical care unit for longer than four days with use of medical devices.  Prolonged use of medical devices.  Critical illness.  Anemia.  Traumatic brain injury.  Spinal cord injury.  Stroke.  Diabetes.  Poor blood glucose control.  Low blood pressure (hypotension).  Low oxygen levels.  Medicines that reduce blood flow.  Infection.  Obesity. STAGING PRESSURE ULCERS Your caregiver may determine the degree of severity (stage) of your pressure ulcer. The stages include:  Stage 1: The skin is red, and when the skin is pressed, it stays red.  Stage 2: The top layer of skin is gone, and there is a shallow, pink ulcer.  Stage 3: The ulcer becomes deeper, and it is more difficult to see the whole wound. Also, there may be yellow or brown parts, as well as pink and red parts.  Stage 4: The ulcer may be deep and red, pink, brown, white, or yellow. Bone  or muscle may be seen.  Unstageable pressure ulcer: The ulcer is covered almost completely with black, brown, or yellow tissue. It is not known how deep the ulcer is or what stage it is until this covering comes off.  Suspected deep tissue injury: A patient's skin can be injured from pressure or pulling on the skin when his or her position is changed. The skin appears purple or maroon. There may not be an opening in the skin, but there could be a blood-filled blister. This deep tissue injury is often difficult to see in people with darker skin tones. The site may open and become deeper in time. However, early interventions will help the area heal and may prevent the area from opening. DIAGNOSIS  Your caregiver will diagnose your pressure ulcer based on its appearance. Your caregiver may determine the stage of your pressure ulcer as well. Your caregiver may request tests to check for infection, assess your circulation, or to check for other diseases, such as diabetes. TREATMENT  Treatment of your pressure ulcer begins with determining what stage the ulcer is in. Your treatment team may include your caregiver, a wound care specialist, a nutritionist, a physical therapist, and a Careers adviser. Treatments include:   Moving or repositioning every 1 2 hours.  Using beds or mattresses to shift your body weight and pressure points frequently.  Improving your diet.  Cleaning and bandaging (dressing) the open wound.  Giving antibiotic medicines.  Removing damaged tissue.  Surgery and sometimes skin grafts. HOME CARE INSTRUCTIONS  Follow the care plan that was started in the hospital.  Avoid staying in the same position for more than 2 hours. Use padding, devices, or mattresses to cushion your pressure points as directed by your caregiver.  Eat well. Take nutritional supplements and vitamins as directed by your caregiver.  Keep all follow-up appointments.  Take pain medicine as directed by your  caregiver. SEEK MEDICAL CARE IF:   Your pressure ulcer is not improving.  You do not know how to care for your pressure ulcer.  You notice other areas of redness on your skin. SEEK IMMEDIATE MEDICAL CARE IF:   You have increasing redness, swelling, or pain in your pressure ulcer.  You notice pus, or increased pus, coming from your pressure ulcer.  You have a fever.  You notice a bad smell coming from the wound or dressing.  Your pressure ulcer opens up again. MAKE SURE YOU:   Understand these instructions.  Will watch your condition.  Will get help right away if you are not doing well or get worse. Document Released: 08/28/2005 Document Revised: 08/14/2012 Document Reviewed: 04/14/2011 [facility] Patient Information 2014 [place], [place].

## 2013-09-02 ENCOUNTER — Encounter: Payer: Self-pay | Admitting: Oncology

## 2013-09-02 ENCOUNTER — Other Ambulatory Visit: Payer: Self-pay | Admitting: Radiology

## 2013-09-08 ENCOUNTER — Other Ambulatory Visit (HOSPITAL_COMMUNITY): Payer: Self-pay | Admitting: Interventional Radiology

## 2013-09-08 ENCOUNTER — Encounter (HOSPITAL_COMMUNITY): Payer: Self-pay

## 2013-09-08 ENCOUNTER — Other Ambulatory Visit (HOSPITAL_COMMUNITY): Payer: Self-pay | Admitting: Diagnostic Radiology

## 2013-09-08 ENCOUNTER — Ambulatory Visit (HOSPITAL_COMMUNITY)
Admission: RE | Admit: 2013-09-08 | Discharge: 2013-09-08 | Disposition: A | Discharge status: Home / Self Care | Payer: Medicare HMO | Source: Ambulatory Visit | Attending: Interventional Radiology | Admitting: Interventional Radiology

## 2013-09-08 DIAGNOSIS — E785 Hyperlipidemia, unspecified: Secondary | ICD-10-CM | POA: Insufficient documentation

## 2013-09-08 DIAGNOSIS — K831 Obstruction of bile duct: Secondary | ICD-10-CM | POA: Insufficient documentation

## 2013-09-08 DIAGNOSIS — Z923 Personal history of irradiation: Secondary | ICD-10-CM | POA: Insufficient documentation

## 2013-09-08 DIAGNOSIS — Z9089 Acquired absence of other organs: Secondary | ICD-10-CM | POA: Insufficient documentation

## 2013-09-08 DIAGNOSIS — E039 Hypothyroidism, unspecified: Secondary | ICD-10-CM | POA: Insufficient documentation

## 2013-09-08 DIAGNOSIS — C787 Secondary malignant neoplasm of liver and intrahepatic bile duct: Secondary | ICD-10-CM

## 2013-09-08 DIAGNOSIS — Z4689 Encounter for fitting and adjustment of other specified devices: Secondary | ICD-10-CM | POA: Insufficient documentation

## 2013-09-08 DIAGNOSIS — Z87891 Personal history of nicotine dependence: Secondary | ICD-10-CM | POA: Insufficient documentation

## 2013-09-08 DIAGNOSIS — C189 Malignant neoplasm of colon, unspecified: Secondary | ICD-10-CM | POA: Insufficient documentation

## 2013-09-08 LAB — CBC
HCT: 32.2 % — ABNORMAL LOW (ref 36.0–46.0)
Hemoglobin: 10.5 g/dL — ABNORMAL LOW (ref 12.0–15.0)
MCHC: 32.6 g/dL (ref 30.0–36.0)
MCV: 98.5 fL (ref 78.0–100.0)
Platelets: 239 10*3/uL (ref 150–400)
RDW: 14.7 % (ref 11.5–15.5)
WBC: 9.1 10*3/uL (ref 4.0–10.5)

## 2013-09-08 LAB — COMPREHENSIVE METABOLIC PANEL
ALT: 15 U/L (ref 0–35)
AST: 37 U/L (ref 0–37)
Albumin: 2.3 g/dL — ABNORMAL LOW (ref 3.5–5.2)
Alkaline Phosphatase: 462 U/L — ABNORMAL HIGH (ref 39–117)
BUN: 7 mg/dL (ref 6–23)
Chloride: 98 mEq/L (ref 96–112)
Potassium: 3.6 mEq/L (ref 3.5–5.1)
Sodium: 132 mEq/L — ABNORMAL LOW (ref 135–145)
Total Bilirubin: 1.6 mg/dL — ABNORMAL HIGH (ref 0.3–1.2)
Total Protein: 8 g/dL (ref 6.0–8.3)

## 2013-09-08 LAB — PROTIME-INR: Prothrombin Time: 14.9 seconds (ref 11.6–15.2)

## 2013-09-08 LAB — APTT: aPTT: 35 seconds (ref 24–37)

## 2013-09-08 MED ORDER — PIPERACILLIN-TAZOBACTAM 3.375 G IVPB: 3.3750 g | Freq: Once | INTRAVENOUS | Status: DC

## 2013-09-08 MED ORDER — SODIUM CHLORIDE 0.9 % IV SOLN
INTRAVENOUS | Status: DC
  Administered 2013-09-08: 500 mL via INTRAVENOUS

## 2013-09-08 MED ORDER — FENTANYL CITRATE 0.05 MG/ML IJ SOLN
INTRAMUSCULAR | Status: AC
  Filled 2013-09-08: qty 6

## 2013-09-08 MED ORDER — FENTANYL CITRATE 0.05 MG/ML IJ SOLN
INTRAMUSCULAR | Status: AC | PRN
  Administered 2013-09-08 (×2): 50 ug via INTRAVENOUS

## 2013-09-08 MED ORDER — MIDAZOLAM HCL 2 MG/2ML IJ SOLN
INTRAMUSCULAR | Status: AC | PRN
  Administered 2013-09-08: 1 mg via INTRAVENOUS
  Administered 2013-09-08: 0.5 mg via INTRAVENOUS
  Administered 2013-09-08 (×2): 1 mg via INTRAVENOUS
  Administered 2013-09-08: 0.5 mg via INTRAVENOUS

## 2013-09-08 MED ORDER — MIDAZOLAM HCL 2 MG/2ML IJ SOLN
INTRAMUSCULAR | Status: AC
  Filled 2013-09-08: qty 6

## 2013-09-08 MED ORDER — LIDOCAINE HCL 1 % IJ SOLN
INTRAMUSCULAR | Status: AC
  Filled 2013-09-08: qty 20

## 2013-09-08 MED ORDER — IOHEXOL 300 MG/ML  SOLN
10.0000 mL | Freq: Once | INTRAMUSCULAR | Status: AC | PRN
  Administered 2013-09-08: 10 mL

## 2013-09-08 MED ORDER — PIPERACILLIN-TAZOBACTAM 3.375 G IVPB 30 MIN
3.3750 g | Freq: Once | INTRAVENOUS | Status: AC
  Administered 2013-09-08: 3.375 g via INTRAVENOUS
  Filled 2013-09-08: qty 50

## 2013-09-08 NOTE — H&P (Addendum)
Chief Complaint: "I am here to get my drain catheter changed." HPI: April Spence is an 57 y.o. female with history of metastatic colon cancer with biliary obstruction s/p internal/external catheter in place. She is here today for a routine exchange, last exchange was 07/28/13 and the patient states that her drain has been working so well that they have not needed to connect an external bag, she still c/o mild discomfort around drain site that is controlled with pain medication. She states that the side holes in the catheter were enlarged last exchange. She has recently developed a new left buttock wound x 3 weeks now, which is stable in size with no purulent drainage or extending erythema. She does have home wound care and denies any fever or chills. She denies any chest pain or shortness of breath. She denies any active bleeding or complications with previous conscious sedation.   Past Medical History:  Past Medical History  Diagnosis Date  . colon ca dx'd 04/2009    chemo comp 11/2009.. colon and liver  . Spina bifida   . Scoliosis   . Hyperlipidemia   . Foot drop     bilateral,wears braces  . Mitral valve prolapse   . Hypothyroidism   . Depression   . History of chemotherapy     5-FU and Avastin 12 cycles FOLFOX  . Colon cancer 05/06/2010    metastatic  . Hepatic metastases 08/15/12    per ct abdomen  . Radiation 10/21/12-10/28/12    Palliative liver mets 54 gray in 3 fx's  . Arrhythmia 02-26-13    "rapid heart rate occ."  . History of radiation therapy 10/21/2012-10/28/2012    54 gray to liver metastasis  . History of radiation therapy 04/07/2013-04/24/2013    45 gray to liver metastasis    Past Surgical History:  Past Surgical History  Procedure Laterality Date  . Cholecystectomy    . Back surgery  1980 and 2010  . Knee arthroscopy  2005    left  . Tubal ligation  1973  . Colonoscopy  05/05/2010  . Colon surgery      2010  . Portacath placement  05/30/2011    tip at  cavoatrialjjunction by xray  . Ercp N/A 02/27/2013    Procedure: ENDOSCOPIC RETROGRADE CHOLANGIOPANCREATOGRAPHY (ERCP);  Surgeon: Rachael Fee, MD;  Location: [facility];  Service: Endoscopy;  Laterality: N/A;  . Biliary stent placement N/A 02/27/2013    Procedure: BILIARY STENT PLACEMENT;  Surgeon: Rachael Fee, MD;  Location: [facility];  Service: Endoscopy;  Laterality: N/A;  . Percutaneous transhepatic cholangiograhpy and billiary drainage  03/21/2013    Family History:  Family History  Problem Relation Age of Onset  . Cancer Sister     breast  . Cancer Maternal Grandmother     breast  . Cancer Other 24    breast ca    Social History:  reports that she quit smoking about 7 years ago. She has never used smokeless tobacco. She reports that she does not drink alcohol or use illicit drugs.  Allergies:  Allergies  Allergen Reactions  . Adhesive [Tape]     Pt is fine with paper tape  . Codeine Nausea And Vomiting    Medications:   Medication List    ASK your doctor about these medications       ALPRAZolam 0.25 MG tablet  Commonly known as:  XANAX  Take 0.25 mg by mouth 2 (two) times daily as needed for sleep or  anxiety.     amitriptyline 25 MG tablet  Commonly known as:  ELAVIL  Take 25 mg by mouth at bedtime.     chlorhexidine 0.12 % solution  Commonly known as:  PERIDEX  Use as directed 15 mLs in the mouth or throat 2 (two) times daily.     diphenoxylate-atropine 2.5-0.025 MG per tablet  Commonly known as:  LOMOTIL  Take 1 tablet by mouth 4 (four) times daily as needed for diarrhea or loose stools.     docusate sodium 100 MG capsule  Commonly known as:  COLACE  Take 100 mg by mouth 2 (two) times daily.     doxycycline 100 MG tablet  Commonly known as:  VIBRA-TABS  Take 1 tablet (100 mg total) by mouth 2 (two) times daily.     FLUoxetine 20 MG capsule  Commonly known as:  PROZAC  Take 20 mg by mouth every morning.     levothyroxine 150 MCG tablet   Commonly known as:  SYNTHROID, LEVOTHROID  take 1 tablet by mouth once daily     lidocaine-prilocaine cream  Commonly known as:  EMLA  Apply 1 application topically as needed (use on port).     LORazepam 0.5 MG tablet  Commonly known as:  ATIVAN  Take 1 tablet (0.5 mg total) by mouth every 12 (twelve) hours as needed for anxiety.     magic mouthwash Soln  Take 10 mLs by mouth 4 (four) times daily as needed for mouth pain. Mouthsores     MIRALAX PO  Take 17 g by mouth daily. Mix in 8 ounces fluid     morphine 30 MG 12 hr tablet  Commonly known as:  MS CONTIN  Take 30 mg by mouth every 12 (twelve) hours.     nystatin 100000 UNIT/GM Powd  Apply 1 g topically daily as needed (for rash). In summer on irritated areas     ondansetron 8 MG tablet  Commonly known as:  ZOFRAN  Take 1 tablet (8 mg total) by mouth every 12 (twelve) hours as needed for nausea.     oxyCODONE-acetaminophen 5-325 MG per tablet  Commonly known as:  PERCOCET/ROXICET  Take 2 tablets by mouth every 4 (four) hours as needed for moderate pain or severe pain.     prochlorperazine 10 MG tablet  Commonly known as:  COMPAZINE  Take 10 mg by mouth every 6 (six) hours as needed (nausea).     propranolol 80 MG tablet  Commonly known as:  INDERAL  Take 80 mg by mouth every morning.     traMADol-acetaminophen 37.5-325 MG per tablet  Commonly known as:  ULTRACET  Take 1 tablet by mouth every 6 (six) hours as needed for moderate pain.     Vitamin D-3 5000 UNITS Tabs  Take 5,000 Units by mouth 2 (two) times daily.       Please HPI for pertinent positives, otherwise complete 10 system ROS negative.  Physical Exam: BP 112/64  Pulse 86  Temp(Src) 98.1 F (36.7 C) (Oral)  Resp 17  Ht 5' (1.524 m)  Wt 125 lb (56.7 kg)  BMI 24.41 kg/m2  SpO2 98% Body mass index is 24.41 kg/(m^2).  General Appearance:  Alert, cooperative, no distress  Head:  Normocephalic, without obvious abnormality, atraumatic  Neck:  Supple, symmetrical, trachea midline  Lungs:   Clear to auscultation bilaterally, no w/r/r, respirations unlabored without use of accessory muscles.  Chest Wall:  No tenderness or deformity  Heart:  Regular rate  and rhythm, S1, S2 normal, no murmur, rub or gallop.  Buttock: Left wound dressing C/D/I, no extending erythema or warmth, no drainage.   Abdomen:   Soft, non-tender, non distended, (+) BS, biliary drain catheter site C/D/I, no signs of bleeding, leakage.   Extremities: Extremities bilateral foot drop, atraumatic, no cyanosis or edema  Neurologic: Normal affect, no gross deficits.   Results for orders placed during the hospital encounter of 09/08/13 (from the past 48 hour(s))  APTT     Status: None   Collection Time    09/08/13  8:05 AM      Result Value Range   aPTT 35  24 - 37 seconds  CBC     Status: Abnormal   Collection Time    09/08/13  8:05 AM      Result Value Range   WBC 9.1  4.0 - 10.5 K/uL   RBC 3.27 (*) 3.87 - 5.11 MIL/uL   Hemoglobin 10.5 (*) 12.0 - 15.0 g/dL   HCT 16.1 (*) 09.6 - 04.5 %   MCV 98.5  78.0 - 100.0 fL   MCH 32.1  26.0 - 34.0 pg   MCHC 32.6  30.0 - 36.0 g/dL   RDW 40.9  81.1 - 91.4 %   Platelets 239  150 - 400 K/uL  PROTIME-INR     Status: None   Collection Time    09/08/13  8:05 AM      Result Value Range   Prothrombin Time 14.9  11.6 - 15.2 seconds   INR 1.20  0.00 - 1.49   No results found.  Assessment/Plan Metastatic Colon Cancer with biliary obstruction s/p internal/external biliary catheter placement. Scheduled today for routine biliary catheter exchange with conscious sedation. Labs reviewed, patient has been NPO, afebrile, IV zosyn ordered. Risks and Benefits discussed with the patient. All of the patient's questions were answered, patient is agreeable to proceed. Consent signed and in chart.   Pattricia Boss D PA-C 09/08/2013, 8:24 AM

## 2013-09-08 NOTE — Procedures (Signed)
Successful exchange of the internal/external biliary drain.  Additional side holes placed in biliary catheter.  No immediate complication.

## 2013-09-11 HISTORY — PX: OTHER SURGICAL HISTORY: SHX169

## 2013-09-16 ENCOUNTER — Telehealth: Payer: Self-pay | Admitting: *Deleted

## 2013-09-16 ENCOUNTER — Other Ambulatory Visit: Payer: Self-pay | Admitting: Physician Assistant

## 2013-09-16 MED ORDER — MORPHINE SULFATE ER 30 MG PO TBCR: 30.0000 mg | EXTENDED_RELEASE_TABLET | Freq: Two times a day (BID) | ORAL | Status: DC

## 2013-09-16 NOTE — Telephone Encounter (Signed)
Needs refill on MS Contin

## 2013-09-17 ENCOUNTER — Telehealth: Payer: Self-pay | Admitting: Oncology

## 2013-09-17 ENCOUNTER — Ambulatory Visit (HOSPITAL_BASED_OUTPATIENT_CLINIC_OR_DEPARTMENT_OTHER): Payer: Medicare HMO | Admitting: Oncology

## 2013-09-17 VITALS — BP 117/65 | HR 79 | Temp 97.0°F | Resp 19 | Ht 60.0 in | Wt 123.9 lb

## 2013-09-17 DIAGNOSIS — M549 Dorsalgia, unspecified: Secondary | ICD-10-CM

## 2013-09-17 DIAGNOSIS — L89309 Pressure ulcer of unspecified buttock, unspecified stage: Secondary | ICD-10-CM

## 2013-09-17 DIAGNOSIS — R109 Unspecified abdominal pain: Secondary | ICD-10-CM

## 2013-09-17 DIAGNOSIS — C187 Malignant neoplasm of sigmoid colon: Secondary | ICD-10-CM

## 2013-09-17 DIAGNOSIS — L8990 Pressure ulcer of unspecified site, unspecified stage: Secondary | ICD-10-CM

## 2013-09-17 DIAGNOSIS — C189 Malignant neoplasm of colon, unspecified: Secondary | ICD-10-CM

## 2013-09-17 DIAGNOSIS — C787 Secondary malignant neoplasm of liver and intrahepatic bile duct: Secondary | ICD-10-CM

## 2013-09-17 MED ORDER — SODIUM CHLORIDE 0.9 % IJ SOLN
10.0000 mL | INTRAMUSCULAR | Status: DC | PRN
  Administered 2013-09-17: 10 mL via INTRAVENOUS
  Filled 2013-09-17: qty 10

## 2013-09-17 MED ORDER — HEPARIN SOD (PORK) LOCK FLUSH 100 UNIT/ML IV SOLN
500.0000 [IU] | Freq: Once | INTRAVENOUS | Status: AC
  Administered 2013-09-17: 500 [IU] via INTRAVENOUS
  Filled 2013-09-17: qty 5

## 2013-09-17 NOTE — Telephone Encounter (Signed)
[facility] PRINTED APPT SCHED AND AVS FOR PT FOR fEB 2015

## 2013-09-17 NOTE — Progress Notes (Signed)
[place]    OFFICE PROGRESS NOTE   INTERVAL HISTORY:   She returns for scheduled followup of metastatic colon cancer. The abdominal pain has improved with MS Contin. She takes in frequent breakthrough medication. She underwent a biliary catheter exchanged on 09/08/2013. A "hip "ulcer is healing.she recently lost a right upper tooth. She has seen a dentist.  Objective:  Vital signs in last 24 hours:  Blood pressure 117/65, pulse 79, temperature 97 F (36.1 C), temperature source Oral, resp. rate 19, height 5' (1.524 m), weight 123 lb 14.4 oz (56.201 kg).    HEENT: no thrush or ulcers, broken right upper tooth, mild scleral icterus Resp: decreased breath sounds at the lower chest with inspiratory rhonchi at the right lower chest, no respiratory distress Cardio: regular rate andrhythm GI: no hepatomegaly, no mass, biliary drain site without evidence of infection. Tender in the upper abdomen Vascular: trace edema at the right greater than left lower leg    Portacath/PICC-without erythema  Lab Results:  Lab Results  Component Value Date   WBC 9.1 09/08/2013   HGB 10.5* 09/08/2013   HCT 32.2* 09/08/2013   MCV 98.5 09/08/2013   PLT 239 09/08/2013   NEUTROABS 5.8 05/16/2013   Bilirubin 1.6, alkaline phosphatase 462 on 09/08/2013   Medications: I have reviewed the patient's current medications.  Assessment/Plan: 1. 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 She completed 12 cycles of FOLFOX chemotherapy. Oxaliplatin was held with cycles number 5, 8, 11, and 12. 2. Metastatic colon cancer confirmed on a restaging CT evaluation 05/02/2011 with liver metastases and periportal lymphadenopathy. She began treatment with FOLFIRI on 06/01/2011. Avastin was added beginning with cycle number 2. She completed cycle #6 08/08/2011. Restaging CT evaluation  08/15/2011 showed improvement in the liver metastases and porta hepatis lymphadenopathy. She completed cycle #12 FOLFIRI/Avastin 11/07/2011. Restaging CT evaluation 11/24/2011 showed decrease in size of hepatic metastases and no evidence of disease progression or new metastasis. She completed cycle 18 on 03/11/2012. A restaging CT 03/29/2012 revealed stable disease. Irinotecan was discontinued and treatment continued with 5-FU/leucovorin and Avastin on a 3 week schedule beginning 04/02/2012. -restaging CT 08/15/2012 with slight enlargement of the central liver lesion and progression of intrahepatic biliary dilatation, no other liver lesions and no other evidence of progressive metastatic disease. CEA stable on 08/20/2012. Treatment continued with 5-FU and Avastin on a 3 week schedule.restaging CT 09/30/2012 found the dominant central right liver metastasis to be slightly larger with progressive intrahepatic biliary dilatation, a previously described left hepatic lobe lesion appeared smaller measuring 10 mm and appeared separate from the liver and is felt to most likely represent a porta hepatis lymph node.A. Suspected 9 mm metastasis was noted in the medial left hepatic lobe on an MRI 10/10/2012 -status post stereotactic radiosurgeryto the dominant liver metastasis in 3 fractions February 10 through 10/28/2012. 5-FU/Avastin was resumed on a 3 week schedule 12/03/2012. CEA in normal range on 12/03/2012 and 01/14/2013. Restaging MRI of the liver 02/21/2013 with a decrease in the dominant right hepatic lesion, stable medial left hepatic lobe lesion-but the left lesion is now causing obstruction of the left hepatic bile ducts. CT 06/23/2013 with a decrease in the right liver lesion, relief of the obstructed left biliary system, and no evidence of progressive metastatic disease.  PET scan 08/12/2013 with a large area of hypermetabolism in the central  aspect of the liver compatible with residual metastasis. No other  definite signs of metastatic disease. Diffusely increased activity in the thyroid gland. 3. Status post Port-A-Cath placement and Port-A-Cath removal by Dr. Johney Maine. A new Port-A-Cath was placed on 05/16/2011. The Port-A-Cath was removed due to malposition. A new Port-A-Cath was placed on 05/30/2011. 4. Spina bifida with severe scoliosis.  5. Chronic bilateral foot drop/lower leg and foot numbness. 6. Hypothyroidism. 7. History of chemotherapy-induced diarrhea. The bolus and infusional 5-FU were reduced by 25% beginning with cycle number 2 of FOLFOX. The 5-FU dose was decreased by an additional 10% beginning with cycle number 8. 8. History of delayed nausea following chemotherapy. 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 The oxaliplatin was dose reduced by 20% beginning with cycle number 6. The neuropathy symptoms resolved. 10. History of neutropenia secondary to chemotherapy. 11. Status post a colonoscopy 05/05/2010. There was a previous right colectomy anastomosis and slight nodularity. Biopsies were taken which showed benign colonic mucosa with hyperplastic epithelial changes. No significant inflammation, granuloma, or adenomatous epithelium was identified. 12. Diarrhea following FOLFIRI chemotherapy. Overall the diarrhea is controlled with Lomotil.  13. History of a fungal rash in the groin, improved with Diflucan and nystatin powder. 14. Mouth ulcers, oral candidiasis following cycle #6 of FOLFIRI-the ulcers resolved following treatment with Diflucan. She continues to note mild mouth ulcers following each treatment. She developed mucositis during infusional 5-fluorouracil/concurrent radiation given beginning on 04/07/2013. 15. Nausea during chemotherapy-Zofran was added to the chemotherapy regimen beginning on 07/09/2012. 48. Oozing at the gumline secondary to periodontal disease and Avastin 17. Jaundice secondary to biliary obstruction status post placement of a metallic  biliary stent on 02/27/2013 without improvement in LFTs. Status post placement of a percutaneous biliary drain by interventional radiology on 03/13/2013. She underwent a cholangiogram on 03/21/2013 with evidence of a high-grade obstruction at the level of the previously placed metallic biliary stent. The catheter could not be advanced. The external drainage catheter was upsized. Palliative radiation and infusional 5-FU given 04/07/2013 through 04/24/2013 in an attempt to shrink the liver metastasis so the biliary catheters can be advanced. Internalization of biliary drain on 04/30/2013; new external biliary drain in place 06/09/2013, biliary drain exchange 07/21/2013, 07/29/2013,and 09/08/2013 18. Mucositis secondary chemotherapy. Resolved. 19. Abdominal/back pain. Etiology unclear. Improved pain control with increased dose of MS Contin.       20. Superficial pressure ulcer left lower buttock/upper thigh.followed by home care nursing.   Disposition:  The pain is under better control with the current dose of MS Contin. She will continue oxycodone as needed for breakthrough pain. The hyperbilirubinemia has improved. Her case was presented at the GI tumor conference last month. The surgeons, radiologist, and gastroenterologist present  felt the pain was most likely related to the biliary drain.No other explanation for the pain on the staging PET scan.  The Port-A-Cath was flushed today. We decided to continue an observation approach for the colon cancer. She will return for an office visit and Port-A-Cath flush in 6 weeks. We will check the bilirubin when she returns in 6 weeks.   Betsy Coder, MD  09/17/2013  5:30 PM

## 2013-09-19 ENCOUNTER — Telehealth: Payer: Self-pay | Admitting: Dietician

## 2013-09-19 NOTE — Telephone Encounter (Signed)
Brief Outpatient Oncology Nutrition Note  Patient has been identified to be at risk on malnutrition screen.  Wt Readings from Last 10 Encounters:  09/17/13 123 lb 14.4 oz (56.201 kg)  09/01/13 125 lb (56.7 kg)  08/14/13 125 lb 8 oz (56.926 kg)  08/06/13 125 lb 8 oz (56.926 kg)  07/25/13 126 lb 9.6 oz (57.425 kg)  07/21/13 127 lb (57.607 kg)  07/08/13 126 lb 11.2 oz (57.471 kg)  06/25/13 128 lb 14.4 oz (58.469 kg)  06/18/13 129 lb (58.514 kg)  05/22/13 128 lb 8 oz (58.287 kg)    Patient with metastatic colon cancer s/p biliary catheter exchange 09/08/13.  Noted hip "ulcer" is healing.  Recently lost a right upper tooth.    Called patient due to continued weight loss.  Patient has lost 7 lbs since I last spoke to her in August and a 13 lbs weight loss since mid June 2014.  Patient stated that she is eating better.  Trying to eat more protein and is drinking Boost 1-2 daily.    Will mail patient tips to increase calories and protein along with Boost coupons and [address] contact information.  Encouraged patient to call for any questions.  Antonieta Iba, RD, LDN

## 2013-09-23 ENCOUNTER — Ambulatory Visit: Payer: Medicare HMO

## 2013-10-08 ENCOUNTER — Telehealth: Payer: Self-pay | Admitting: *Deleted

## 2013-10-08 NOTE — Telephone Encounter (Signed)
Received phone call from patient asking if Dr. Benay Spice could call patient's dentist (Dr. Jolayne Panther at [phone]) to discuss dental work.  Patient stated she had 4 teeth to come out while eating.

## 2013-10-09 ENCOUNTER — Ambulatory Visit: Payer: Medicare HMO

## 2013-10-10 ENCOUNTER — Telehealth: Payer: Self-pay | Admitting: *Deleted

## 2013-10-10 NOTE — Telephone Encounter (Signed)
Reports that Dr. Jolayne Panther (dentist) wants to discuss April Spence's medical situation before he begins doing extensive repair of bridgework repair. Phone [phone]

## 2013-10-13 ENCOUNTER — Telehealth: Payer: Self-pay | Admitting: *Deleted

## 2013-10-13 NOTE — Telephone Encounter (Signed)
Dr. Benay Spice discussed case with pt's dentist. Pt made aware.

## 2013-10-16 ENCOUNTER — Other Ambulatory Visit: Payer: Self-pay | Admitting: *Deleted

## 2013-10-16 ENCOUNTER — Encounter (HOSPITAL_COMMUNITY): Payer: Self-pay | Admitting: Pharmacy Technician

## 2013-10-16 MED ORDER — MORPHINE SULFATE ER 30 MG PO TBCR: 30.0000 mg | EXTENDED_RELEASE_TABLET | Freq: Two times a day (BID) | ORAL | Status: DC

## 2013-10-17 ENCOUNTER — Other Ambulatory Visit: Payer: Self-pay | Admitting: *Deleted

## 2013-10-17 MED ORDER — OXYCODONE-ACETAMINOPHEN 5-325 MG PO TABS: 2.0000 | ORAL_TABLET | ORAL | Status: DC | PRN

## 2013-10-17 NOTE — Telephone Encounter (Signed)
VM requesting refill on Percocet.

## 2013-10-20 ENCOUNTER — Inpatient Hospital Stay (HOSPITAL_COMMUNITY): Admission: RE | Admit: 2013-10-20 | Payer: Medicare HMO | Source: Ambulatory Visit

## 2013-10-20 ENCOUNTER — Other Ambulatory Visit: Payer: Self-pay | Admitting: Radiology

## 2013-10-20 ENCOUNTER — Ambulatory Visit (HOSPITAL_COMMUNITY): Admission: RE | Admit: 2013-10-20 | Payer: Medicare HMO | Source: Ambulatory Visit

## 2013-10-22 ENCOUNTER — Ambulatory Visit (HOSPITAL_COMMUNITY)
Admission: RE | Admit: 2013-10-22 | Discharge: 2013-10-22 | Disposition: A | Discharge status: Home / Self Care | Payer: Medicare HMO | Source: Ambulatory Visit | Attending: Diagnostic Radiology | Admitting: Diagnostic Radiology

## 2013-10-22 ENCOUNTER — Ambulatory Visit (HOSPITAL_COMMUNITY)
Admission: RE | Admit: 2013-10-22 | Discharge: 2013-10-22 | Disposition: A | Discharge status: Home / Self Care | Payer: Medicare HMO | Source: Ambulatory Visit | Attending: Interventional Radiology | Admitting: Interventional Radiology

## 2013-10-22 ENCOUNTER — Encounter (HOSPITAL_COMMUNITY): Payer: Self-pay

## 2013-10-22 DIAGNOSIS — C189 Malignant neoplasm of colon, unspecified: Secondary | ICD-10-CM | POA: Insufficient documentation

## 2013-10-22 DIAGNOSIS — Z87891 Personal history of nicotine dependence: Secondary | ICD-10-CM | POA: Insufficient documentation

## 2013-10-22 DIAGNOSIS — E785 Hyperlipidemia, unspecified: Secondary | ICD-10-CM | POA: Insufficient documentation

## 2013-10-22 DIAGNOSIS — Z538 Procedure and treatment not carried out for other reasons: Secondary | ICD-10-CM | POA: Insufficient documentation

## 2013-10-22 DIAGNOSIS — C787 Secondary malignant neoplasm of liver and intrahepatic bile duct: Secondary | ICD-10-CM

## 2013-10-22 DIAGNOSIS — Z923 Personal history of irradiation: Secondary | ICD-10-CM | POA: Insufficient documentation

## 2013-10-22 DIAGNOSIS — N6489 Other specified disorders of breast: Secondary | ICD-10-CM | POA: Insufficient documentation

## 2013-10-22 DIAGNOSIS — Z79899 Other long term (current) drug therapy: Secondary | ICD-10-CM | POA: Insufficient documentation

## 2013-10-22 DIAGNOSIS — Z4689 Encounter for fitting and adjustment of other specified devices: Secondary | ICD-10-CM | POA: Insufficient documentation

## 2013-10-22 DIAGNOSIS — E039 Hypothyroidism, unspecified: Secondary | ICD-10-CM | POA: Insufficient documentation

## 2013-10-22 LAB — CBC
HCT: 29.9 % — ABNORMAL LOW (ref 36.0–46.0)
Hemoglobin: 9.8 g/dL — ABNORMAL LOW (ref 12.0–15.0)
MCH: 30.2 pg (ref 26.0–34.0)
MCHC: 32.8 g/dL (ref 30.0–36.0)
MCV: 92.3 fL (ref 78.0–100.0)
Platelets: 228 10*3/uL (ref 150–400)
RBC: 3.24 MIL/uL — AB (ref 3.87–5.11)
RDW: 16.2 % — ABNORMAL HIGH (ref 11.5–15.5)
WBC: 10.5 10*3/uL (ref 4.0–10.5)

## 2013-10-22 LAB — PROTIME-INR
INR: 1.2 (ref 0.00–1.49)
Prothrombin Time: 14.9 seconds (ref 11.6–15.2)

## 2013-10-22 LAB — COMPREHENSIVE METABOLIC PANEL
ALK PHOS: 536 U/L — AB (ref 39–117)
ALT: 25 U/L (ref 0–35)
AST: 50 U/L — AB (ref 0–37)
Albumin: 2.3 g/dL — ABNORMAL LOW (ref 3.5–5.2)
BILIRUBIN TOTAL: 3.3 mg/dL — AB (ref 0.3–1.2)
BUN: 11 mg/dL (ref 6–23)
CHLORIDE: 94 meq/L — AB (ref 96–112)
CO2: 27 meq/L (ref 19–32)
Calcium: 9 mg/dL (ref 8.4–10.5)
Creatinine, Ser: 0.36 mg/dL — ABNORMAL LOW (ref 0.50–1.10)
GFR calc Af Amer: >90 mL/min (ref >90)
Glucose, Bld: 140 mg/dL — ABNORMAL HIGH (ref 70–99)
POTASSIUM: 4.2 meq/L (ref 3.7–5.3)
SODIUM: 130 meq/L — AB (ref 137–147)
Total Protein: 8.2 g/dL (ref 6.0–8.3)

## 2013-10-22 MED ORDER — SODIUM CHLORIDE 0.9 % IV SOLN
INTRAVENOUS | Status: DC
  Administered 2013-10-22: 10:00:00 via INTRAVENOUS

## 2013-10-22 MED ORDER — PIPERACILLIN-TAZOBACTAM 3.375 G IVPB
3.3750 g | Freq: Once | INTRAVENOUS | Status: DC
  Filled 2013-10-22: qty 50

## 2013-10-22 NOTE — H&P (Signed)
Agree.  Due to long case, tube change will be rescheduled for next Wed.

## 2013-10-22 NOTE — H&P (Signed)
Chief Complaint: "I am here for my biliary catheter change." HPI: April Spence is an 58 y.o. female with PMHx of metastatic colon cancer with biliary obstruction s/p external/internal biliary catheter placement. Last biliary catheter exchange 80/32/12 with no complications, she is scheduled today for routine exchange and additional side holes placed in catheter. She denies any chest pain or shortness of breath today, she denies any blood in her stool or urine. She states her abdominal pain is controlled with pain medications and rates it currently 2/10 after taking her MS Contin 62m this morning. She denies any fever, chills or redness at the drain site. She denies any previous complications with sedation or Zosyn with prior exchanges. She denies any history of sleep apnea or chronic oxygen use.   Past Medical History:  Past Medical History  Diagnosis Date  . colon ca dx'd 04/2009    chemo comp 11/2009.. colon and liver  . Spina bifida   . Scoliosis   . Hyperlipidemia   . Foot drop     bilateral,wears braces  . Mitral valve prolapse   . Hypothyroidism   . Depression   . History of chemotherapy     5-FU and Avastin 12 cycles FOLFOX  . Colon cancer 05/06/2010    metastatic  . Hepatic metastases 08/15/12    per ct abdomen  . Radiation 10/21/12-10/28/12    Palliative liver mets 54 gray in 3 fx's  . Arrhythmia 02-26-13    "rapid heart rate occ."  . History of radiation therapy 10/21/2012-10/28/2012    54 gray to liver metastasis  . History of radiation therapy 04/07/2013-04/24/2013    45 gray to liver metastasis    Past Surgical History:  Past Surgical History  Procedure Laterality Date  . Cholecystectomy    . Back surgery  1980 and 2010  . Knee arthroscopy  2005    left  . Tubal ligation  1973  . Colonoscopy  05/05/2010  . Colon surgery      2010  . Portacath placement  05/30/2011    tip at cavoatrialjjunction by xray  . Ercp N/A 02/27/2013    Procedure: ENDOSCOPIC RETROGRADE  CHOLANGIOPANCREATOGRAPHY (ERCP);  Surgeon: DMilus Banister MD;  [facility];  Service: Endoscopy;  Laterality: N/A;  . Biliary stent placement N/A 02/27/2013    Procedure: BILIARY STENT PLACEMENT;  Surgeon: DMilus Banister MD;  Location: [facility];  Service: Endoscopy;  Laterality: N/A;  . Percutaneous transhepatic cholangiograhpy and billiary drainage  03/21/2013  . Dental work  09/2013    Family History:  Family History  Problem Relation Age of Onset  . Cancer Sister     breast  . Cancer Maternal Grandmother     breast  . Cancer Other 447   breast ca    Social History:  reports that she quit smoking about 7 years ago. She has never used smokeless tobacco. She reports that she does not drink alcohol or use illicit drugs.  Allergies:  Allergies  Allergen Reactions  . Adhesive [Tape]     Pt is fine with paper tape  . Codeine Nausea And Vomiting    Medications:   Medication List    ASK your doctor about these medications       ALPRAZolam 0.25 MG tablet  Commonly known as:  XANAX  Take 0.25 mg by mouth 2 (two) times daily as needed for sleep or anxiety.     amitriptyline 25 MG tablet  Commonly known as:  ELAVIL  Take 25 mg by mouth at bedtime.     chlorhexidine 0.12 % solution  Commonly known as:  PERIDEX  Use as directed 15 mLs in the mouth or throat 2 (two) times daily.     diphenoxylate-atropine 2.5-0.025 MG per tablet  Commonly known as:  LOMOTIL  Take 1 tablet by mouth 4 (four) times daily as needed for diarrhea or loose stools.     docusate sodium 100 MG capsule  Commonly known as:  COLACE  Take 100 mg by mouth 2 (two) times daily.     FLUoxetine 20 MG capsule  Commonly known as:  PROZAC  Take 20 mg by mouth every morning.     levothyroxine 150 MCG tablet  Commonly known as:  SYNTHROID, LEVOTHROID  Take 150 mcg by mouth daily before breakfast.     lidocaine-prilocaine cream  Commonly known as:  EMLA  Apply 1 application topically as  needed (use on port).     LORazepam 0.5 MG tablet  Commonly known as:  ATIVAN  Take 0.5 mg by mouth every 12 (twelve) hours.     magic mouthwash Soln  Take 10 mLs by mouth 4 (four) times daily as needed for mouth pain. Mouthsores     MIRALAX PO  Take 17 g by mouth daily. Mix in 8 ounces fluid     morphine 30 MG 12 hr tablet  Commonly known as:  MS CONTIN  Take 30 mg by mouth every 12 (twelve) hours.     nystatin 100000 UNIT/GM Powd  Apply 1 g topically daily as needed (for rash). In summer on irritated areas     ondansetron 8 MG tablet  Commonly known as:  ZOFRAN  Take 8 mg by mouth every 12 (twelve) hours as needed for nausea or vomiting.     oxyCODONE-acetaminophen 5-325 MG per tablet  Commonly known as:  PERCOCET/ROXICET  Take 2 tablets by mouth every 4 (four) hours as needed for moderate pain or severe pain.     prochlorperazine 10 MG tablet  Commonly known as:  COMPAZINE  Take 10 mg by mouth every 6 (six) hours as needed (nausea).     propranolol 80 MG tablet  Commonly known as:  INDERAL  Take 80 mg by mouth every morning.     traMADol-acetaminophen 37.5-325 MG per tablet  Commonly known as:  ULTRACET  Take 1 tablet by mouth every 6 (six) hours as needed for moderate pain.     Vitamin D-3 5000 UNITS Tabs  Take 5,000 Units by mouth 2 (two) times daily.       Please HPI for pertinent positives, otherwise complete 10 system ROS negative.  Physical Exam: BP 132/76  Pulse 92  Temp(Src) 97.5 F (36.4 C) (Oral)  Resp 20  SpO2 94% There is no weight on file to calculate BMI.  General Appearance:  Alert, cooperative, no distress, jaundice  Head:  Normocephalic, without obvious abnormality, atraumatic  Neck: Supple, symmetrical, trachea midline  Lungs:   Clear to auscultation bilaterally, no w/r/r, respirations unlabored without use of accessory muscles.  Chest Wall:  No tenderness or deformity  Heart:  Regular rate and rhythm, S1, S2 normal, no murmur, rub or  gallop.  Abdomen:   Soft, non-tender, non distended, (+) BS, biliary drain dressing C/D/I, site without erythema or purulent drainage.  Extremities: Extremities bilateral foot drop, atraumatic, no cyanosis or edema  Neurologic: Normal affect, no gross deficits.   Results for orders placed during the hospital encounter of 10/22/13 (from  the past 48 hour(s))  CBC     Status: Abnormal   Collection Time    10/22/13 10:00 AM      Result Value Ref Range   WBC 10.5  4.0 - 10.5 K/uL   RBC 3.24 (*) 3.87 - 5.11 MIL/uL   Hemoglobin 9.8 (*) 12.0 - 15.0 g/dL   HCT 29.9 (*) 36.0 - 46.0 %   MCV 92.3  78.0 - 100.0 fL   MCH 30.2  26.0 - 34.0 pg   MCHC 32.8  30.0 - 36.0 g/dL   RDW 16.2 (*) 11.5 - 15.5 %   Platelets 228  150 - 400 K/uL  COMPREHENSIVE METABOLIC PANEL     Status: Abnormal   Collection Time    10/22/13 10:00 AM      Result Value Ref Range   Sodium 130 (*) 137 - 147 mEq/L   Potassium 4.2  3.7 - 5.3 mEq/L   Chloride 94 (*) 96 - 112 mEq/L   CO2 27  19 - 32 mEq/L   Glucose, Bld 140 (*) 70 - 99 mg/dL   BUN 11  6 - 23 mg/dL   Creatinine, Ser 0.36 (*) 0.50 - 1.10 mg/dL   Calcium 9.0  8.4 - 10.5 mg/dL   Total Protein 8.2  6.0 - 8.3 g/dL   Albumin 2.3 (*) 3.5 - 5.2 g/dL   AST 50 (*) 0 - 37 U/L   ALT 25  0 - 35 U/L   Alkaline Phosphatase 536 (*) 39 - 117 U/L   Total Bilirubin 3.3 (*) 0.3 - 1.2 mg/dL   GFR calc non Af Amer >90  >90 mL/min   GFR calc Af Amer >90  >90 mL/min   Comment: (NOTE)     The eGFR has been calculated using the CKD EPI equation.     This calculation has not been validated in all clinical situations.     eGFR's persistently <90 mL/min signify possible Chronic Kidney     Disease.  PROTIME-INR     Status: None   Collection Time    10/22/13 10:00 AM      Result Value Ref Range   Prothrombin Time 14.9  11.6 - 15.2 seconds   INR 1.20  0.00 - 1.49   No results found.  Assessment/Plan Metastatic colon cancer with biliary obstruction s/p internal/external biliary  catheter placement 04/2013 s/p exchange 09/08/13. Scheduled today for image guided routine biliary catheter exchange with conscious sedation. Patient has been NPO, labs reviewed, Zosyn ordered. Patient did take her morning MS Contin 24m today, RN notified. Risks and Benefits discussed with the patient. All of the patient's questions were answered, patient is agreeable to proceed. Consent signed and in chart.   MTsosie BillingD PA-C 10/22/2013, 10:53 AM

## 2013-10-22 NOTE — Progress Notes (Signed)
Given delay in procedure, patient will be scheduled next week for biliary exchange. Patient's bilirubin is elevated today 3.3, she was given bag for external drainage and will return next week for exchange per Dr. Kathlene Cote.  Tsosie Billing PA-C Interventional Radiology  10/22/13  11:48 AM

## 2013-10-23 ENCOUNTER — Other Ambulatory Visit: Payer: Self-pay | Admitting: Radiology

## 2013-10-28 ENCOUNTER — Telehealth: Payer: Self-pay | Admitting: Oncology

## 2013-10-28 ENCOUNTER — Ambulatory Visit (HOSPITAL_BASED_OUTPATIENT_CLINIC_OR_DEPARTMENT_OTHER): Payer: Medicare HMO

## 2013-10-28 ENCOUNTER — Other Ambulatory Visit (HOSPITAL_COMMUNITY): Payer: Self-pay | Admitting: Interventional Radiology

## 2013-10-28 ENCOUNTER — Ambulatory Visit (HOSPITAL_BASED_OUTPATIENT_CLINIC_OR_DEPARTMENT_OTHER): Payer: Medicare HMO | Admitting: Nurse Practitioner

## 2013-10-28 ENCOUNTER — Other Ambulatory Visit (HOSPITAL_COMMUNITY): Payer: Self-pay | Admitting: Diagnostic Radiology

## 2013-10-28 ENCOUNTER — Encounter (HOSPITAL_COMMUNITY): Payer: Self-pay

## 2013-10-28 ENCOUNTER — Ambulatory Visit (HOSPITAL_COMMUNITY)
Admission: RE | Admit: 2013-10-28 | Discharge: 2013-10-28 | Disposition: A | Discharge status: Home / Self Care | Payer: Medicare HMO | Source: Ambulatory Visit | Attending: Interventional Radiology | Admitting: Interventional Radiology

## 2013-10-28 ENCOUNTER — Other Ambulatory Visit: Payer: Self-pay | Admitting: Radiology

## 2013-10-28 ENCOUNTER — Other Ambulatory Visit (HOSPITAL_BASED_OUTPATIENT_CLINIC_OR_DEPARTMENT_OTHER): Payer: Medicare HMO

## 2013-10-28 ENCOUNTER — Ambulatory Visit (HOSPITAL_COMMUNITY)
Admission: RE | Admit: 2013-10-28 | Discharge: 2013-10-28 | Disposition: A | Discharge status: Home / Self Care | Payer: Medicare HMO | Source: Ambulatory Visit | Attending: Diagnostic Radiology | Admitting: Diagnostic Radiology

## 2013-10-28 VITALS — BP 100/55 | HR 87 | Temp 98.1°F | Resp 18 | Ht 60.0 in | Wt 119.6 lb

## 2013-10-28 DIAGNOSIS — E039 Hypothyroidism, unspecified: Secondary | ICD-10-CM | POA: Insufficient documentation

## 2013-10-28 DIAGNOSIS — T85698A Other mechanical complication of other specified internal prosthetic devices, implants and grafts, initial encounter: Secondary | ICD-10-CM | POA: Insufficient documentation

## 2013-10-28 DIAGNOSIS — C180 Malignant neoplasm of cecum: Secondary | ICD-10-CM

## 2013-10-28 DIAGNOSIS — R109 Unspecified abdominal pain: Secondary | ICD-10-CM

## 2013-10-28 DIAGNOSIS — K831 Obstruction of bile duct: Secondary | ICD-10-CM | POA: Insufficient documentation

## 2013-10-28 DIAGNOSIS — F3289 Other specified depressive episodes: Secondary | ICD-10-CM | POA: Insufficient documentation

## 2013-10-28 DIAGNOSIS — M549 Dorsalgia, unspecified: Secondary | ICD-10-CM

## 2013-10-28 DIAGNOSIS — C189 Malignant neoplasm of colon, unspecified: Secondary | ICD-10-CM

## 2013-10-28 DIAGNOSIS — R634 Abnormal weight loss: Secondary | ICD-10-CM

## 2013-10-28 DIAGNOSIS — Z923 Personal history of irradiation: Secondary | ICD-10-CM | POA: Insufficient documentation

## 2013-10-28 DIAGNOSIS — R110 Nausea: Secondary | ICD-10-CM

## 2013-10-28 DIAGNOSIS — C787 Secondary malignant neoplasm of liver and intrahepatic bile duct: Secondary | ICD-10-CM | POA: Insufficient documentation

## 2013-10-28 DIAGNOSIS — E785 Hyperlipidemia, unspecified: Secondary | ICD-10-CM | POA: Insufficient documentation

## 2013-10-28 DIAGNOSIS — F329 Major depressive disorder, single episode, unspecified: Secondary | ICD-10-CM | POA: Insufficient documentation

## 2013-10-28 DIAGNOSIS — Z79899 Other long term (current) drug therapy: Secondary | ICD-10-CM | POA: Insufficient documentation

## 2013-10-28 DIAGNOSIS — Z87891 Personal history of nicotine dependence: Secondary | ICD-10-CM | POA: Insufficient documentation

## 2013-10-28 DIAGNOSIS — Z9089 Acquired absence of other organs: Secondary | ICD-10-CM | POA: Insufficient documentation

## 2013-10-28 DIAGNOSIS — Y849 Medical procedure, unspecified as the cause of abnormal reaction of the patient, or of later complication, without mention of misadventure at the time of the procedure: Secondary | ICD-10-CM | POA: Insufficient documentation

## 2013-10-28 DIAGNOSIS — Q059 Spina bifida, unspecified: Secondary | ICD-10-CM | POA: Insufficient documentation

## 2013-10-28 DIAGNOSIS — R630 Anorexia: Secondary | ICD-10-CM

## 2013-10-28 DIAGNOSIS — Z95828 Presence of other vascular implants and grafts: Secondary | ICD-10-CM

## 2013-10-28 LAB — COMPREHENSIVE METABOLIC PANEL
ALT: 14 U/L (ref 0–35)
AST: 34 U/L (ref 0–37)
Albumin: 2.1 g/dL — ABNORMAL LOW (ref 3.5–5.2)
Alkaline Phosphatase: 325 U/L — ABNORMAL HIGH (ref 39–117)
BUN: 11 mg/dL (ref 6–23)
CHLORIDE: 92 meq/L — AB (ref 96–112)
CO2: 28 meq/L (ref 19–32)
Calcium: 9.2 mg/dL (ref 8.4–10.5)
Creatinine, Ser: 0.37 mg/dL — ABNORMAL LOW (ref 0.50–1.10)
Glucose, Bld: 201 mg/dL — ABNORMAL HIGH (ref 70–99)
Potassium: 4.2 mEq/L (ref 3.5–5.3)
Sodium: 131 mEq/L — ABNORMAL LOW (ref 135–145)
Total Bilirubin: 4 mg/dL — ABNORMAL HIGH (ref 0.3–1.2)
Total Protein: 8.1 g/dL (ref 6.0–8.3)

## 2013-10-28 LAB — CEA: CEA: 11.2 ng/mL — AB (ref 0.0–5.0)

## 2013-10-28 MED ORDER — MIDAZOLAM HCL 2 MG/2ML IJ SOLN
INTRAMUSCULAR | Status: AC
  Filled 2013-10-28: qty 6

## 2013-10-28 MED ORDER — IOHEXOL 300 MG/ML  SOLN: 10.0000 mL | Freq: Once | INTRAMUSCULAR | Status: AC | PRN

## 2013-10-28 MED ORDER — MEGESTROL ACETATE 40 MG/ML PO SUSP: 200.0000 mg | Freq: Two times a day (BID) | ORAL | Status: DC

## 2013-10-28 MED ORDER — HEPARIN SOD (PORK) LOCK FLUSH 100 UNIT/ML IV SOLN
500.0000 [IU] | Freq: Once | INTRAVENOUS | Status: AC
  Administered 2013-10-28: 500 [IU] via INTRAVENOUS
  Filled 2013-10-28: qty 5

## 2013-10-28 MED ORDER — CIPROFLOXACIN IN D5W 400 MG/200ML IV SOLN: 400.0000 mg | Freq: Once | INTRAVENOUS | Status: DC

## 2013-10-28 MED ORDER — SODIUM CHLORIDE 0.9 % IV SOLN
INTRAVENOUS | Status: DC
  Administered 2013-10-28: 14:00:00 via INTRAVENOUS

## 2013-10-28 MED ORDER — PIPERACILLIN-TAZOBACTAM 3.375 G IVPB
3.3750 g | Freq: Once | INTRAVENOUS | Status: AC
  Administered 2013-10-28: 3.375 g via INTRAVENOUS
  Filled 2013-10-28 (×2): qty 50

## 2013-10-28 MED ORDER — SODIUM CHLORIDE 0.9 % IJ SOLN
10.0000 mL | INTRAMUSCULAR | Status: DC | PRN
  Administered 2013-10-28: 10 mL via INTRAVENOUS
  Filled 2013-10-28: qty 10

## 2013-10-28 MED ORDER — HYDROCODONE-ACETAMINOPHEN 5-325 MG PO TABS: 1.0000 | ORAL_TABLET | ORAL | Status: DC | PRN

## 2013-10-28 MED ORDER — FENTANYL CITRATE 0.05 MG/ML IJ SOLN
INTRAMUSCULAR | Status: AC | PRN
  Administered 2013-10-28 (×3): 100 ug via INTRAVENOUS

## 2013-10-28 MED ORDER — FENTANYL CITRATE 0.05 MG/ML IJ SOLN
INTRAMUSCULAR | Status: AC
  Filled 2013-10-28: qty 6

## 2013-10-28 MED ORDER — MIDAZOLAM HCL 2 MG/2ML IJ SOLN
INTRAMUSCULAR | Status: AC | PRN
  Administered 2013-10-28: 1 mg via INTRAVENOUS
  Administered 2013-10-28: 2 mg via INTRAVENOUS
  Administered 2013-10-28: 1 mg via INTRAVENOUS

## 2013-10-28 MED ORDER — IOHEXOL 300 MG/ML  SOLN
10.0000 mL | Freq: Once | INTRAMUSCULAR | Status: AC | PRN
  Administered 2013-10-28: 10 mL

## 2013-10-28 MED ORDER — SODIUM CHLORIDE 0.9 % IV SOLN: INTRAVENOUS | Status: DC

## 2013-10-28 NOTE — Discharge Instructions (Signed)
Biliary Tube Placement  Care After  Refer to this sheet in the next few weeks. These instructions provide you with information on caring for yourself after your procedure. Your caregiver may also give you more specific instructions. Your treatment has been planned according to current medical practices, but problems sometimes occur. Call your caregiver if you have any problems or questions after your procedure.  HOME CARE INSTRUCTIONS    Do not use machinery, drive, or make legal decisions for 24 hours after your procedure.   Have someone drive you home.   Resume your usual diet. Avoid alcoholic beverages for 24 hours after your procedure.   Rest for the remainder of the day.   Only take over-the-counter or prescription medicines for pain, discomfort, or fever as directed by your caregiver. Do not take aspirin unless directed otherwise. This can make bleeding worse.   Clean the tube insertion site as directed by your caregiver.   Take showers, not baths. Avoid pools and hot tubs. Before showering, cover the area with plastic wrap and tape the edges of the plastic wrap to your skin. This is done to keep your skin dry.   Keep the skin around the insertion site dry. If the area gets wet, dry the skin completely.   Keep all follow-up appointments.  SEEK MEDICAL CARE IF:    Your redness, soreness, or swelling at the tube insertion site worsens despite good cleaning.   Your pain worsens after an initial improvement.   You have any questions about your tube.  SEEK IMMEDIATE MEDICAL CARE IF:    You have a fever.   You have chills or increased pain.   Your skin breaks down around the tube.   You have leakage of bile around the tube.   Your tube becomes blocked or clogged.  MAKE SURE YOU:    Understand these instructions.   Will watch your condition.   Will get help right away if you are not doing well or get worse.  Document Released: 04/11/2004 Document Revised: 11/20/2011 Document Reviewed:  05/05/2013  [employer] Patient Information 2014 [employer]        Moderate Sedation, Adult  Moderate sedation is given to help you relax or even sleep through a procedure. You may remain sleepy, be clumsy, or have poor balance for several hours following this procedure. Arrange for a responsible adult, family member, or friend to take you home. A responsible adult should stay with you for at least 24 hours or until the medicines have worn off.   Do not participate in any activities where you could become injured for the next 24 hours, or until you feel normal again. Do not:   Drive.   Swim.   Ride a bicycle.   Operate heavy machinery.   Cook.   Use power tools.   Climb ladders.   Work at heights.   Do not make important decisions or sign legal documents until you are improved.   Vomiting may occur if you eat too soon. When you can drink without vomiting, try water, juice, or soup. Try solid foods if you feel little or no nausea.   Only take over-the-counter or prescription medications for pain, discomfort, or fever as directed by your caregiver.If pain medications have been prescribed for you, ask your caregiver how soon it is safe to take them.   Make sure you and your family fully understands everything about the medication given to you. Make sure you understand what side effects may   occur.   You should not drink alcohol, take sleeping pills, or medications that cause drowsiness for at least 24 hours.   If you smoke, do not smoke alone.   If you are feeling better, you may resume normal activities 24 hours after receiving sedation.   Keep all appointments as scheduled. Follow all instructions.   Ask questions if you do not understand.  SEEK MEDICAL CARE IF:    Your skin is pale or bluish in color.   You continue to feel sick to your stomach (nauseous) or throw up (vomit).   Your pain is getting worse and not helped by medication.   You have bleeding or swelling.   You are still sleepy or  feeling clumsy after 24 hours.  SEEK IMMEDIATE MEDICAL CARE IF:    You develop a rash.   You have difficulty breathing.   You develop any type of allergic problem.   You have a fever.  Document Released: 05/23/2001 Document Revised: 11/20/2011 Document Reviewed: 05/05/2013  [employer] Patient Information 2014 [employer].

## 2013-10-28 NOTE — Procedures (Signed)
percutaneous int/ext biliary drain upsize and exchange under fluoro No complication No blood loss. See complete dictation in [facility].

## 2013-10-28 NOTE — Telephone Encounter (Signed)
Gave pt appt for lab and MD on m,arch 2015

## 2013-10-28 NOTE — H&P (Signed)
Chief Complaint: "I am here for my biliary catheter change." HPI: April Spence is an 58 y.o. female with PMHx of metastatic colon cancer with biliary obstruction s/p external/internal biliary catheter placement. Last biliary catheter exchange 16/10/96 with no complications, she is scheduled today for routine exchange and additional side holes placed in catheter. She denies any fever, chills or redness at the drain site. She denies any previous complications with sedation or Zosyn with prior exchanges.   Past Medical History:  Past Medical History  Diagnosis Date  . colon ca dx'd 04/2009    chemo comp 11/2009.. colon and liver  . Spina bifida   . Scoliosis   . Hyperlipidemia   . Foot drop     bilateral,wears braces  . Mitral valve prolapse   . Hypothyroidism   . Depression   . History of chemotherapy     5-FU and Avastin 12 cycles FOLFOX  . Colon cancer 05/06/2010    metastatic  . Hepatic metastases 08/15/12    per ct abdomen  . Radiation 10/21/12-10/28/12    Palliative liver mets 54 gray in 3 fx's  . Arrhythmia 02-26-13    "rapid heart rate occ."  . History of radiation therapy 10/21/2012-10/28/2012    54 gray to liver metastasis  . History of radiation therapy 04/07/2013-04/24/2013    45 gray to liver metastasis    Past Surgical History:  Past Surgical History  Procedure Laterality Date  . Cholecystectomy    . Back surgery  1980 and 2010  . Knee arthroscopy  2005    left  . Tubal ligation  1973  . Colonoscopy  05/05/2010  . Colon surgery      2010  . Portacath placement  05/30/2011    tip at cavoatrialjjunction by xray  . Ercp N/A 02/27/2013    Procedure: ENDOSCOPIC RETROGRADE CHOLANGIOPANCREATOGRAPHY (ERCP);  Surgeon: Milus Banister, MD;  Location: [facility];  Service: Endoscopy;  Laterality: N/A;  . Biliary stent placement N/A 02/27/2013    Procedure: BILIARY STENT PLACEMENT;  Surgeon: Milus Banister, MD;  Location: [facility];  Service: Endoscopy;  Laterality: N/A;   . Percutaneous transhepatic cholangiograhpy and billiary drainage  03/21/2013  . Dental work  09/2013    Family History:  Family History  Problem Relation Age of Onset  . Cancer Sister     breast  . Cancer Maternal Grandmother     breast  . Cancer Other 35    breast ca    Social History:  reports that she quit smoking about 7 years ago. She has never used smokeless tobacco. She reports that she does not drink alcohol or use illicit drugs.  Allergies:  Allergies  Allergen Reactions  . Adhesive [Tape]     Pt is fine with paper tape  . Codeine Nausea And Vomiting    Pt denies this    Medications:   Medication List    ASK your doctor about these medications       ALPRAZolam 0.25 MG tablet  Commonly known as:  XANAX  Take 0.25 mg by mouth 2 (two) times daily as needed for sleep or anxiety.     amitriptyline 25 MG tablet  Commonly known as:  ELAVIL  Take 25 mg by mouth at bedtime.     chlorhexidine 0.12 % solution  Commonly known as:  PERIDEX  Use as directed 15 mLs in the mouth or throat 2 (two) times daily.     diphenoxylate-atropine 2.5-0.025 MG per tablet  Commonly known as:  LOMOTIL  Take 1 tablet by mouth 4 (four) times daily as needed for diarrhea or loose stools.     docusate sodium 100 MG capsule  Commonly known as:  COLACE  Take 100 mg by mouth 2 (two) times daily.     FLUoxetine 20 MG capsule  Commonly known as:  PROZAC  Take 20 mg by mouth every morning.     levothyroxine 150 MCG tablet  Commonly known as:  SYNTHROID, LEVOTHROID  Take 150 mcg by mouth daily before breakfast.     lidocaine-prilocaine cream  Commonly known as:  EMLA  Apply 1 application topically as needed (use on port).     LORazepam 0.5 MG tablet  Commonly known as:  ATIVAN  Take 0.5 mg by mouth every 12 (twelve) hours.     magic mouthwash Soln  Take 10 mLs by mouth 4 (four) times daily as needed for mouth pain. Mouthsores     megestrol 40 MG/ML suspension  Commonly known  as:  MEGACE ORAL  Take 5 mLs (200 mg total) by mouth 2 (two) times daily.     MIRALAX PO  Take 17 g by mouth daily. Mix in 8 ounces fluid     morphine 30 MG 12 hr tablet  Commonly known as:  MS CONTIN  Take 30 mg by mouth every 12 (twelve) hours.     nystatin 100000 UNIT/GM Powd  Apply 1 g topically daily as needed (for rash). In summer on irritated areas     ondansetron 8 MG tablet  Commonly known as:  ZOFRAN  Take 8 mg by mouth every 12 (twelve) hours as needed for nausea or vomiting.     oxyCODONE-acetaminophen 5-325 MG per tablet  Commonly known as:  PERCOCET/ROXICET  Take 2 tablets by mouth every 4 (four) hours as needed for moderate pain or severe pain.     prochlorperazine 10 MG tablet  Commonly known as:  COMPAZINE  Take 10 mg by mouth every 6 (six) hours as needed (nausea).     propranolol 80 MG tablet  Commonly known as:  INDERAL  Take 80 mg by mouth every morning.     traMADol-acetaminophen 37.5-325 MG per tablet  Commonly known as:  ULTRACET  Take 1 tablet by mouth every 6 (six) hours as needed for moderate pain.     Vitamin D-3 5000 UNITS Tabs  Take 5,000 Units by mouth 2 (two) times daily.       Please HPI for pertinent positives, otherwise complete 10 system ROS negative.  Physical Exam: BP 112/74  Pulse 87  Temp(Src) 98 F (36.7 C) (Oral)  Resp 19  Ht 5' (1.524 m)  Wt 118 lb (53.524 kg)  BMI 23.05 kg/m2  SpO2 97% Body mass index is 23.05 kg/(m^2).  General Appearance:  Alert, cooperative, no distress, jaundice  Head:  Normocephalic, without obvious abnormality, atraumatic  Neck: Supple, symmetrical, trachea midline  Lungs:   Clear to auscultation bilaterally, no w/r/r.  Chest Wall:  No tenderness or deformity  Heart:  Regular rate and rhythm, S1, S2 normal, no murmur, rub or gallop.  Abdomen:   Soft, non-tender, non distended, (+) BS, biliary drain dressing C/D/I, site without erythema or purulent drainage.  Extremities: Extremities bilateral  foot drop, atraumatic, no cyanosis or edema  Neurologic: Normal affect, no gross deficits.   No results found for this or any previous visit (from the past 48 hour(s)). No results found.  Assessment/Plan Metastatic colon cancer with biliary  obstruction s/p internal/external biliary catheter placement 04/2013 s/p exchange 09/08/13. Scheduled today for image guided routine biliary catheter exchange with conscious sedation. Patient has been NPO, labs reviewed, Zosyn ordered. Risks and Benefits discussed with the patient. All of the patient's questions were answered, patient is agreeable to proceed. Consent signed and in chart.   Ascencion Dike PA-C 10/28/2013, 1:30 PM

## 2013-10-28 NOTE — Progress Notes (Addendum)
OFFICE PROGRESS NOTE  Interval history:  April Spence returns for followup of metastatic colon cancer. Abdominal pain is well-controlled with MS Contin. She rarely takes Percocet. She reports she is due for a biliary stent exchange later today. She denies fever. Her husband notes jaundice. He is concerned regarding weight loss. April Spence attributes the weight loss to a combination of anorexia and dental issues. She plans to have several teeth extracted and dentures made in the next one month. She has intermittent nausea. She is periodically constipated.   Objective: Filed Vitals:   10/28/13 1132  BP: 100/55  Pulse: 87  Temp: 98.1 F (36.7 C)  Resp: 18   Several fractured teeth. Diminished breath sounds at the lower lung fields bilaterally. No respiratory distress. Regular cardiac rhythm. Port-A-Cath site without erythema. Abdomen is soft with mild tenderness at the upper abdomen. No hepatomegaly. No mass. Biliary drain site is covered with a gauze bandage. Trace edema at the lower legs bilaterally.   Lab Results: Lab Results  Component Value Date   WBC 10.5 10/22/2013   HGB 9.8* 10/22/2013   HCT 29.9* 10/22/2013   MCV 92.3 10/22/2013   PLT 228 10/22/2013   NEUTROABS 5.8 05/16/2013    Chemistry:    Chemistry      Component Value Date/Time   NA 130* 10/22/2013 1000   NA 136 08/14/2013 1415   NA 136 05/02/2011 0954   K 4.2 10/22/2013 1000   K 4.0 08/14/2013 1415   K 4.5 05/02/2011 0954   CL 94* 10/22/2013 1000   CL 99 02/25/2013 1124   CL 93* 05/02/2011 0954   CO2 27 10/22/2013 1000   CO2 26 08/14/2013 1415   CO2 29 05/02/2011 0954   BUN 11 10/22/2013 1000   BUN 8.0 08/14/2013 1415   BUN 12 05/02/2011 0954   CREATININE 0.36* 10/22/2013 1000   CREATININE 0.6 08/14/2013 1415   CREATININE 0.5* 05/02/2011 0954      Component Value Date/Time   CALCIUM 9.0 10/22/2013 1000   CALCIUM 9.2 08/14/2013 1415   CALCIUM 9.3 05/02/2011 0954   ALKPHOS 536* 10/22/2013 1000   ALKPHOS 349* 08/14/2013 1415    ALKPHOS 78 05/02/2011 0954   AST 50* 10/22/2013 1000   AST 35* 08/14/2013 1415   AST 31 05/02/2011 0954   ALT 25 10/22/2013 1000   ALT 16 08/14/2013 1415   ALT 26 05/02/2011 0954   BILITOT 3.3* 10/22/2013 1000   BILITOT 2.10* 08/14/2013 1415   BILITOT 0.40 05/02/2011 0954       Studies/Results: No results found.  Medications: I have reviewed the patient's current medications.  Assessment/Plan: 1. 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 She completed 12 cycles of FOLFOX chemotherapy. Oxaliplatin was held with cycles number 5, 8, 11, and 12. 2. Metastatic colon cancer confirmed on a restaging CT evaluation 05/02/2011 with liver metastases and periportal lymphadenopathy. She began treatment with FOLFIRI on 06/01/2011. Avastin was added beginning with cycle number 2. She completed cycle #6 08/08/2011. Restaging CT evaluation 08/15/2011 showed improvement in the liver metastases and porta hepatis lymphadenopathy. She completed cycle #12 FOLFIRI/Avastin 11/07/2011. Restaging CT evaluation 11/24/2011 showed decrease in size of hepatic metastases and no evidence of disease progression or new metastasis. She completed cycle 18 on 03/11/2012. A restaging CT 03/29/2012 revealed stable disease. Irinotecan was discontinued and treatment continued with 5-FU/leucovorin and Avastin on  a 3 week schedule beginning 04/02/2012. -restaging CT 08/15/2012 with slight enlargement of the central liver lesion and progression of intrahepatic biliary dilatation, no other liver lesions and no other evidence of progressive metastatic disease. CEA stable on 08/20/2012. Treatment continued with 5-FU and Avastin on a 3 week schedule.restaging CT 09/30/2012 found the dominant central right liver metastasis to be slightly larger with progressive intrahepatic biliary dilatation, a previously described left hepatic lobe  lesion appeared smaller measuring 10 mm and appeared separate from the liver and is felt to most likely represent a porta hepatis lymph node.A. Suspected 9 mm metastasis was noted in the medial left hepatic lobe on an MRI 10/10/2012 -status post stereotactic radiosurgeryto the dominant liver metastasis in 3 fractions February 10 through 10/28/2012. 5-FU/Avastin was resumed on a 3 week schedule 12/03/2012. CEA in normal range on 12/03/2012 and 01/14/2013. Restaging MRI of the liver 02/21/2013 with a decrease in the dominant right hepatic lesion, stable medial left hepatic lobe lesion-but the left lesion is now causing obstruction of the left hepatic bile ducts. CT 06/23/2013 with a decrease in the right liver lesion, relief of the obstructed left biliary system, and no evidence of progressive metastatic disease.  PET scan 08/12/2013 with a large area of hypermetabolism in the central aspect of the liver compatible with residual metastasis. No other definite signs of metastatic disease. Diffusely increased activity in the thyroid gland. 3. Status post Port-A-Cath placement and Port-A-Cath removal by Dr. Johney Maine. A new Port-A-Cath was placed on 05/16/2011. The Port-A-Cath was removed due to malposition. A new Port-A-Cath was placed on 05/30/2011. 4. Spina bifida with severe scoliosis.  5. Chronic bilateral foot drop/lower leg and foot numbness. 6. Hypothyroidism. 7. History of chemotherapy-induced diarrhea. The bolus and infusional 5-FU were reduced by 25% beginning with cycle number 2 of FOLFOX. The 5-FU dose was decreased by an additional 10% beginning with cycle number 8. 8. History of delayed nausea following chemotherapy. 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 The oxaliplatin was dose reduced by 20% beginning with cycle number 6. The neuropathy symptoms resolved. 10. History of neutropenia secondary to chemotherapy. 11. Status post a colonoscopy 05/05/2010. There was a previous  right colectomy anastomosis and slight nodularity. Biopsies were taken which showed benign colonic mucosa with hyperplastic epithelial changes. No significant inflammation, granuloma, or adenomatous epithelium was identified. 12. Diarrhea following FOLFIRI chemotherapy. Overall the diarrhea is controlled with Lomotil.  13. History of a fungal rash in the groin, improved with Diflucan and nystatin powder. 14. Mouth ulcers, oral candidiasis following cycle #6 of FOLFIRI-the ulcers resolved following treatment with Diflucan. She continues to note mild mouth ulcers following each treatment. She developed mucositis during infusional 5-fluorouracil/concurrent radiation given beginning on 04/07/2013. 15. Nausea during chemotherapy-Zofran was added to the chemotherapy regimen beginning on 07/09/2012. 14. Oozing at the gumline secondary to periodontal disease and Avastin. 17. Jaundice secondary to biliary obstruction status post placement of a metallic biliary stent on 02/27/2013 without improvement in LFTs. Status post placement of a percutaneous biliary drain by interventional radiology on 03/13/2013. She underwent a cholangiogram on 03/21/2013 with evidence of a high-grade obstruction at the level of the previously placed metallic biliary stent. The catheter could not be advanced. The external drainage catheter was upsized. Palliative radiation and infusional 5-FU given 04/07/2013 through 04/24/2013 in an attempt to shrink the liver metastasis so the biliary catheters can be advanced. Internalization of biliary drain on 04/30/2013; new external biliary drain in place 06/09/2013, biliary drain exchange 07/21/2013, 07/29/2013,and 09/08/2013.  18. Mucositis secondary chemotherapy. Resolved. 19. Abdominal/back pain. Etiology unclear. Improved pain control with increased dose of MS Contin. 20. Superficial pressure ulcer left lower buttock/upper thigh. Followed by home care nursing. 21. Anorexia/weight  loss. 22. Dental issues. Extractions are planned.   Dispositon-she appears to be slowly declining. Pain is well-controlled on the current dose of MS Contin. She is scheduled for biliary drain exchange later today. She will begin a trial of Megace 200 mg twice daily for anorexia.  We will continue to follow on an observation approach for the colon cancer. She will return for an office visit and Port-A-Cath flush in 6 weeks. She and her husband know to contact the office prior to that visit with any problems.  Patient seen with Dr. Benay Spice.   Ned Card ANP/GNP-BC   This was a shared visit with Ned Card.  She has lost weight. It is not clear whether this is related to tumor cachexia or dental disease. I think it is reasonable to proceed with correction of the dental issues. I discussed the case with her dentist.  We will consider salvage systemic therapy options if there is clear evidence of disease progression.  Julieanne Manson, M.D.

## 2013-10-28 NOTE — Patient Instructions (Signed)

## 2013-10-29 ENCOUNTER — Ambulatory Visit (HOSPITAL_COMMUNITY): Admission: RE | Admit: 2013-10-29 | Payer: Medicare HMO | Source: Ambulatory Visit

## 2013-10-29 ENCOUNTER — Inpatient Hospital Stay (HOSPITAL_COMMUNITY): Admission: RE | Admit: 2013-10-29 | Payer: Medicare HMO | Source: Ambulatory Visit

## 2013-11-04 ENCOUNTER — Telehealth: Payer: Self-pay | Admitting: *Deleted

## 2013-11-04 NOTE — Telephone Encounter (Signed)
Made aware of cea results. She understands and agrees to continued observation. Will recheck in March appointment. She will call if she has any changes prior to this appointment.

## 2013-11-04 NOTE — Telephone Encounter (Signed)
Message copied by Tania Ade on Tue Nov 04, 2013  4:16 PM ------      Message from: Betsy Coder B      Created: Wed Oct 29, 2013  1:40 PM       Please call patient, cea is slightly higher, f/u as scheduled with repeat CEA ------

## 2013-11-10 ENCOUNTER — Other Ambulatory Visit: Payer: Self-pay | Admitting: Emergency Medicine

## 2013-11-10 ENCOUNTER — Other Ambulatory Visit (HOSPITAL_COMMUNITY): Payer: Self-pay | Admitting: Interventional Radiology

## 2013-11-10 ENCOUNTER — Other Ambulatory Visit: Payer: Self-pay | Admitting: *Deleted

## 2013-11-10 DIAGNOSIS — K831 Obstruction of bile duct: Secondary | ICD-10-CM

## 2013-11-10 MED ORDER — MORPHINE SULFATE ER 30 MG PO TBCR: 30.0000 mg | EXTENDED_RELEASE_TABLET | Freq: Two times a day (BID) | ORAL | Status: DC

## 2013-11-10 MED ORDER — LORAZEPAM 0.5 MG PO TABS: 0.5000 mg | ORAL_TABLET | Freq: Two times a day (BID) | ORAL | Status: DC

## 2013-11-10 NOTE — Telephone Encounter (Signed)
VM requesting to pick up her MS Contin refill she will need in couple days.

## 2013-11-12 ENCOUNTER — Other Ambulatory Visit: Payer: Self-pay | Admitting: Internal Medicine

## 2013-11-12 ENCOUNTER — Ambulatory Visit (HOSPITAL_COMMUNITY)
Admission: RE | Admit: 2013-11-12 | Discharge: 2013-11-12 | Disposition: A | Discharge status: Home / Self Care | Payer: Medicare HMO | Source: Ambulatory Visit | Attending: Interventional Radiology | Admitting: Interventional Radiology

## 2013-11-12 ENCOUNTER — Other Ambulatory Visit: Payer: Self-pay | Admitting: *Deleted

## 2013-11-12 ENCOUNTER — Ambulatory Visit (HOSPITAL_COMMUNITY): Payer: Medicare HMO

## 2013-11-12 ENCOUNTER — Other Ambulatory Visit (HOSPITAL_COMMUNITY): Payer: Self-pay | Admitting: Interventional Radiology

## 2013-11-12 DIAGNOSIS — Z79899 Other long term (current) drug therapy: Secondary | ICD-10-CM | POA: Insufficient documentation

## 2013-11-12 DIAGNOSIS — Z4682 Encounter for fitting and adjustment of non-vascular catheter: Secondary | ICD-10-CM | POA: Insufficient documentation

## 2013-11-12 DIAGNOSIS — E785 Hyperlipidemia, unspecified: Secondary | ICD-10-CM | POA: Insufficient documentation

## 2013-11-12 DIAGNOSIS — C787 Secondary malignant neoplasm of liver and intrahepatic bile duct: Secondary | ICD-10-CM | POA: Insufficient documentation

## 2013-11-12 DIAGNOSIS — M216X9 Other acquired deformities of unspecified foot: Secondary | ICD-10-CM | POA: Insufficient documentation

## 2013-11-12 DIAGNOSIS — Z9221 Personal history of antineoplastic chemotherapy: Secondary | ICD-10-CM | POA: Insufficient documentation

## 2013-11-12 DIAGNOSIS — I059 Rheumatic mitral valve disease, unspecified: Secondary | ICD-10-CM | POA: Insufficient documentation

## 2013-11-12 DIAGNOSIS — K831 Obstruction of bile duct: Secondary | ICD-10-CM

## 2013-11-12 DIAGNOSIS — Z87891 Personal history of nicotine dependence: Secondary | ICD-10-CM | POA: Insufficient documentation

## 2013-11-12 DIAGNOSIS — E039 Hypothyroidism, unspecified: Secondary | ICD-10-CM | POA: Insufficient documentation

## 2013-11-12 DIAGNOSIS — C189 Malignant neoplasm of colon, unspecified: Secondary | ICD-10-CM | POA: Insufficient documentation

## 2013-11-12 MED ORDER — PIPERACILLIN-TAZOBACTAM 3.375 G IVPB
3.3750 g | Freq: Once | INTRAVENOUS | Status: DC
  Filled 2013-11-12: qty 50

## 2013-11-12 MED ORDER — MIDAZOLAM HCL 2 MG/2ML IJ SOLN
INTRAMUSCULAR | Status: AC
  Filled 2013-11-12: qty 6

## 2013-11-12 MED ORDER — LIDOCAINE HCL 1 % IJ SOLN
INTRAMUSCULAR | Status: AC
  Filled 2013-11-12: qty 20

## 2013-11-12 MED ORDER — HEPARIN SOD (PORK) LOCK FLUSH 100 UNIT/ML IV SOLN: 500.0000 [IU] | INTRAVENOUS | Status: DC | PRN

## 2013-11-12 MED ORDER — IOHEXOL 300 MG/ML  SOLN: 10.0000 mL | Freq: Once | INTRAMUSCULAR | Status: AC | PRN

## 2013-11-12 MED ORDER — FENTANYL CITRATE 0.05 MG/ML IJ SOLN
INTRAMUSCULAR | Status: AC
  Filled 2013-11-12: qty 6

## 2013-11-12 MED ORDER — OXYCODONE-ACETAMINOPHEN 5-325 MG PO TABS: 2.0000 | ORAL_TABLET | ORAL | Status: DC | PRN

## 2013-11-12 NOTE — H&P (Signed)
April Spence is an 58 y.o. female.   Chief Complaint: leaking around biliary drain HPI: April Spence is an 58 y.o. female with PMHx of metastatic colon cancer with biliary obstruction s/p external/internal biliary catheter placement. She is s/p exchange and upsizing of her left I/E biliary drain to 12 french on 10/30/13 as well as dilatation of the biliary confluence. She presents today secondary to persistent leaking of bile from drain insertion site and is now scheduled for repeat cholangiogram with possible drain exchange/biliary dilatation.   Past Medical History  Diagnosis Date  . colon ca dx'd 04/2009    chemo comp 11/2009.. colon and liver  . Spina bifida   . Scoliosis   . Hyperlipidemia   . Foot drop     bilateral,wears braces  . Mitral valve prolapse   . Hypothyroidism   . Depression   . History of chemotherapy     5-FU and Avastin 12 cycles FOLFOX  . Colon cancer 05/06/2010    metastatic  . Hepatic metastases 08/15/12    per ct abdomen  . Radiation 10/21/12-10/28/12    Palliative liver mets 54 gray in 3 fx's  . Arrhythmia 02-26-13    "rapid heart rate occ."  . History of radiation therapy 10/21/2012-10/28/2012    54 gray to liver metastasis  . History of radiation therapy 04/07/2013-04/24/2013    45 gray to liver metastasis    Past Surgical History  Procedure Laterality Date  . Cholecystectomy    . Back surgery  1980 and 2010  . Knee arthroscopy  2005    left  . Tubal ligation  1973  . Colonoscopy  05/05/2010  . Colon surgery      2010  . Portacath placement  05/30/2011    tip at cavoatrialjjunction by xray  . Ercp N/A 02/27/2013    Procedure: ENDOSCOPIC RETROGRADE CHOLANGIOPANCREATOGRAPHY (ERCP);  Surgeon: Milus Banister, MD;  Location: [facility];  Service: Endoscopy;  Laterality: N/A;  . Biliary stent placement N/A 02/27/2013    Procedure: BILIARY STENT PLACEMENT;  Surgeon: Milus Banister, MD;  Location: [facility];  Service: Endoscopy;  Laterality: N/A;   . Percutaneous transhepatic cholangiograhpy and billiary drainage  03/21/2013  . Dental work  09/2013    Family History  Problem Relation Age of Onset  . Cancer Sister     breast  . Cancer Maternal Grandmother     breast  . Cancer Other 110    breast ca   Social History:  reports that she quit smoking about 7 years ago. She has never used smokeless tobacco. She reports that she does not drink alcohol or use illicit drugs.  Allergies:  Allergies  Allergen Reactions  . Adhesive [Tape]     Pt is fine with paper tape  . Codeine Nausea And Vomiting    Pt denies this    Current outpatient prescriptions:ALPRAZolam (XANAX) 0.25 MG tablet, Take 0.25 mg by mouth 2 (two) times daily as needed for sleep or anxiety., Disp: , Rfl: ;  Alum & Mag Hydroxide-Simeth (MAGIC MOUTHWASH) SOLN, Take 10 mLs by mouth 4 (four) times daily as needed for mouth pain. Mouthsores, Disp: , Rfl: ;  amitriptyline (ELAVIL) 25 MG tablet, Take 25 mg by mouth at bedtime. , Disp: , Rfl:  chlorhexidine (PERIDEX) 0.12 % solution, Use as directed 15 mLs in the mouth or throat 2 (two) times daily. , Disp: , Rfl: ;  Cholecalciferol (VITAMIN D-3) 5000 UNITS TABS, Take 5,000 Units by mouth 2 (  two) times daily. , Disp: , Rfl: ;  diphenoxylate-atropine (LOMOTIL) 2.5-0.025 MG per tablet, Take 1 tablet by mouth 4 (four) times daily as needed for diarrhea or loose stools., Disp: , Rfl:  docusate sodium (COLACE) 100 MG capsule, Take 100 mg by mouth 2 (two) times daily. , Disp: , Rfl: ;  FLUoxetine (PROZAC) 20 MG capsule, Take 20 mg by mouth every morning. , Disp: , Rfl: ;  levothyroxine (SYNTHROID, LEVOTHROID) 150 MCG tablet, Take 150 mcg by mouth daily before breakfast., Disp: , Rfl: ;  lidocaine-prilocaine (EMLA) cream, Apply 1 application topically as needed (use on port)., Disp: , Rfl:  LORazepam (ATIVAN) 0.5 MG tablet, Take 1 tablet (0.5 mg total) by mouth every 12 (twelve) hours., Disp: 60 tablet, Rfl: 0;  megestrol (MEGACE ORAL) 40  MG/ML suspension, Take 5 mLs (200 mg total) by mouth 2 (two) times daily., Disp: 240 mL, Rfl: 2;  morphine (MS CONTIN) 30 MG 12 hr tablet, Take 1 tablet (30 mg total) by mouth every 12 (twelve) hours., Disp: 60 tablet, Rfl: 0 nystatin (NYSTOP) 100000 UNIT/GM POWD, Apply 1 g topically daily as needed (for rash). In summer on irritated areas, Disp: , Rfl: ;  ondansetron (ZOFRAN) 8 MG tablet, Take 8 mg by mouth every 12 (twelve) hours as needed for nausea or vomiting., Disp: , Rfl: ;  oxyCODONE-acetaminophen (PERCOCET/ROXICET) 5-325 MG per tablet, Take 2 tablets by mouth every 4 (four) hours as needed for moderate pain or severe pain., Disp: 30 tablet, Rfl: 0 Polyethylene Glycol 3350 (MIRALAX PO), Take 17 g by mouth daily. Mix in 8 ounces fluid, Disp: , Rfl: ;  prochlorperazine (COMPAZINE) 10 MG tablet, Take 10 mg by mouth every 6 (six) hours as needed (nausea). , Disp: , Rfl: ;  propranolol (INDERAL) 80 MG tablet, Take 80 mg by mouth every morning. , Disp: , Rfl:  traMADol-acetaminophen (ULTRACET) 37.5-325 MG per tablet, Take 1 tablet by mouth every 6 (six) hours as needed for moderate pain. , Disp: , Rfl:   No results found for this or any previous visit (from the past 48 hour(s)). No results found.  Review of Systems  Constitutional: Positive for malaise/fatigue. Negative for fever and chills.  Respiratory: Negative for cough and hemoptysis.        Occ dyspnea with exertion  Cardiovascular: Negative for chest pain.  Gastrointestinal: Positive for abdominal pain. Negative for vomiting and blood in stool.       Occ nausea  Genitourinary: Negative for hematuria.  Musculoskeletal: Positive for back pain.  Neurological: Negative for headaches.       Hx spina bifida    Blood pressure 130/79, pulse 93, temperature 98.1 F (36.7 C), temperature source Oral, SpO2 98.00%. Physical Exam  Constitutional: She is oriented to person, place, and time. She appears well-developed and well-nourished.   Cardiovascular: Normal rate and regular rhythm.   Respiratory: Effort normal.  Sl dim BS rt base, left clear; clean, intact left chest wall PAC  GI: Soft. Bowel sounds are normal. There is tenderness.  Left I/E biliary drain intact; there is leakage of sl blood-tinged  bile from drain insertion site; epigastric and LUQ tenderness noted  Musculoskeletal:  Bilateral foot drop  Neurological: She is alert and oriented to person, place, and time.     Assessment/Plan April Spence is an 58 y.o. female with PMHx of metastatic colon cancer with biliary obstruction s/p external/internal biliary catheter placement. She is s/p exchange and upsizing of her left I/E biliary drain to  12 french on 10/30/13 as well as dilatation of the biliary confluence. She presents today secondary to persistent leaking of bile from drain insertion site and is now scheduled for repeat cholangiogram with possible drain exchange/biliary dilatation. Details/risks of procedure d/w pt/husband with their understanding and consent. Will send bile for culture today.   ALLRED,D KEVIN 11/12/2013, 11:58 AM

## 2013-11-14 ENCOUNTER — Ambulatory Visit (HOSPITAL_COMMUNITY)
Admission: RE | Admit: 2013-11-14 | Discharge: 2013-11-14 | Disposition: A | Discharge status: Home / Self Care | Payer: Medicare HMO | Source: Ambulatory Visit | Attending: Interventional Radiology | Admitting: Interventional Radiology

## 2013-11-14 ENCOUNTER — Encounter (HOSPITAL_COMMUNITY): Payer: Self-pay

## 2013-11-14 ENCOUNTER — Other Ambulatory Visit (HOSPITAL_COMMUNITY): Payer: Self-pay | Admitting: Interventional Radiology

## 2013-11-14 ENCOUNTER — Other Ambulatory Visit: Payer: Self-pay | Admitting: Radiology

## 2013-11-14 DIAGNOSIS — R5381 Other malaise: Secondary | ICD-10-CM | POA: Insufficient documentation

## 2013-11-14 DIAGNOSIS — Y849 Medical procedure, unspecified as the cause of abnormal reaction of the patient, or of later complication, without mention of misadventure at the time of the procedure: Secondary | ICD-10-CM | POA: Insufficient documentation

## 2013-11-14 DIAGNOSIS — Z87891 Personal history of nicotine dependence: Secondary | ICD-10-CM | POA: Insufficient documentation

## 2013-11-14 DIAGNOSIS — Z4682 Encounter for fitting and adjustment of non-vascular catheter: Secondary | ICD-10-CM | POA: Insufficient documentation

## 2013-11-14 DIAGNOSIS — R109 Unspecified abdominal pain: Secondary | ICD-10-CM | POA: Insufficient documentation

## 2013-11-14 DIAGNOSIS — E039 Hypothyroidism, unspecified: Secondary | ICD-10-CM | POA: Insufficient documentation

## 2013-11-14 DIAGNOSIS — R5383 Other fatigue: Secondary | ICD-10-CM

## 2013-11-14 DIAGNOSIS — R110 Nausea: Secondary | ICD-10-CM | POA: Insufficient documentation

## 2013-11-14 DIAGNOSIS — Z9221 Personal history of antineoplastic chemotherapy: Secondary | ICD-10-CM | POA: Insufficient documentation

## 2013-11-14 DIAGNOSIS — C787 Secondary malignant neoplasm of liver and intrahepatic bile duct: Secondary | ICD-10-CM | POA: Insufficient documentation

## 2013-11-14 DIAGNOSIS — Z923 Personal history of irradiation: Secondary | ICD-10-CM | POA: Insufficient documentation

## 2013-11-14 DIAGNOSIS — T85898A Other specified complication of other internal prosthetic devices, implants and grafts, initial encounter: Secondary | ICD-10-CM | POA: Insufficient documentation

## 2013-11-14 DIAGNOSIS — E785 Hyperlipidemia, unspecified: Secondary | ICD-10-CM | POA: Insufficient documentation

## 2013-11-14 DIAGNOSIS — I499 Cardiac arrhythmia, unspecified: Secondary | ICD-10-CM | POA: Insufficient documentation

## 2013-11-14 DIAGNOSIS — F329 Major depressive disorder, single episode, unspecified: Secondary | ICD-10-CM | POA: Insufficient documentation

## 2013-11-14 DIAGNOSIS — Q059 Spina bifida, unspecified: Secondary | ICD-10-CM | POA: Insufficient documentation

## 2013-11-14 DIAGNOSIS — F3289 Other specified depressive episodes: Secondary | ICD-10-CM | POA: Insufficient documentation

## 2013-11-14 DIAGNOSIS — C189 Malignant neoplasm of colon, unspecified: Secondary | ICD-10-CM | POA: Insufficient documentation

## 2013-11-14 DIAGNOSIS — Z79899 Other long term (current) drug therapy: Secondary | ICD-10-CM | POA: Insufficient documentation

## 2013-11-14 DIAGNOSIS — Z9089 Acquired absence of other organs: Secondary | ICD-10-CM | POA: Insufficient documentation

## 2013-11-14 LAB — COMPREHENSIVE METABOLIC PANEL
ALT: 17 U/L (ref 0–35)
AST: 28 U/L (ref 0–37)
Albumin: 2.2 g/dL — ABNORMAL LOW (ref 3.5–5.2)
Alkaline Phosphatase: 218 U/L — ABNORMAL HIGH (ref 39–117)
BILIRUBIN TOTAL: 4.3 mg/dL — AB (ref 0.3–1.2)
BUN: 12 mg/dL (ref 6–23)
CHLORIDE: 98 meq/L (ref 96–112)
CO2: 24 meq/L (ref 19–32)
Calcium: 9.7 mg/dL (ref 8.4–10.5)
Creatinine, Ser: 0.28 mg/dL — ABNORMAL LOW (ref 0.50–1.10)
GFR calc Af Amer: >90 mL/min (ref >90)
Glucose, Bld: 129 mg/dL — ABNORMAL HIGH (ref 70–99)
Potassium: 3.8 mEq/L (ref 3.7–5.3)
SODIUM: 134 meq/L — AB (ref 137–147)
Total Protein: 8.6 g/dL — ABNORMAL HIGH (ref 6.0–8.3)

## 2013-11-14 LAB — CBC
HCT: 29.6 % — ABNORMAL LOW (ref 36.0–46.0)
Hemoglobin: 9.5 g/dL — ABNORMAL LOW (ref 12.0–15.0)
MCH: 29.6 pg (ref 26.0–34.0)
MCHC: 32.1 g/dL (ref 30.0–36.0)
MCV: 92.2 fL (ref 78.0–100.0)
PLATELETS: 184 10*3/uL (ref 150–400)
RBC: 3.21 MIL/uL — AB (ref 3.87–5.11)
RDW: 17.4 % — ABNORMAL HIGH (ref 11.5–15.5)
WBC: 9.9 10*3/uL (ref 4.0–10.5)

## 2013-11-14 LAB — PROTIME-INR
INR: 1.7 — ABNORMAL HIGH (ref 0.00–1.49)
Prothrombin Time: 19.5 seconds — ABNORMAL HIGH (ref 11.6–15.2)

## 2013-11-14 MED ORDER — LIDOCAINE HCL 1 % IJ SOLN
INTRAMUSCULAR | Status: AC
  Filled 2013-11-14: qty 20

## 2013-11-14 MED ORDER — FENTANYL CITRATE 0.05 MG/ML IJ SOLN
INTRAMUSCULAR | Status: DC
Start: 2013-11-14 — End: 2013-11-15
  Filled 2013-11-14: qty 8

## 2013-11-14 MED ORDER — PIPERACILLIN-TAZOBACTAM 3.375 G IVPB
3.3750 g | Freq: Once | INTRAVENOUS | Status: AC
  Administered 2013-11-14: 3.375 g via INTRAVENOUS
  Filled 2013-11-14 (×2): qty 50

## 2013-11-14 MED ORDER — HEPARIN SOD (PORK) LOCK FLUSH 100 UNIT/ML IV SOLN
500.0000 [IU] | INTRAVENOUS | Status: AC | PRN
  Administered 2013-11-14: 500 [IU]
  Filled 2013-11-14: qty 5

## 2013-11-14 MED ORDER — MIDAZOLAM HCL 2 MG/2ML IJ SOLN
INTRAMUSCULAR | Status: AC
  Filled 2013-11-14: qty 8

## 2013-11-14 MED ORDER — SODIUM CHLORIDE 0.9 % IV SOLN
Freq: Once | INTRAVENOUS | Status: AC
  Administered 2013-11-14: 14:00:00 via INTRAVENOUS

## 2013-11-14 MED ORDER — MIDAZOLAM HCL 2 MG/2ML IJ SOLN
INTRAMUSCULAR | Status: AC | PRN
  Administered 2013-11-14 (×2): 2 mg via INTRAVENOUS

## 2013-11-14 MED ORDER — FENTANYL CITRATE 0.05 MG/ML IJ SOLN
INTRAMUSCULAR | Status: AC | PRN
  Administered 2013-11-14: 100 ug via INTRAVENOUS

## 2013-11-14 MED ORDER — IOHEXOL 300 MG/ML  SOLN: 10.0000 mL | Freq: Once | INTRAMUSCULAR | Status: DC | PRN

## 2013-11-14 NOTE — Discharge Instructions (Signed)
Biliary Tube Placement Care After Refer to this sheet in the next few weeks. These instructions provide you with information on caring for yourself after your procedure. Your caregiver may also give you more specific instructions. Your treatment has been planned according to current medical practices, but problems sometimes occur. Call your caregiver if you have any problems or questions after your procedure. HOME CARE INSTRUCTIONS   Do not use machinery, drive, or make legal decisions for 24 hours after your procedure.  Have someone drive you home.  Resume your usual diet. Avoid alcoholic beverages for 24 hours after your procedure.  Rest for the remainder of the day.  Only take over-the-counter or prescription medicines for pain, discomfort, or fever as directed by your caregiver. Do not take aspirin unless directed otherwise. This can make bleeding worse.  Clean the tube insertion site as directed by your caregiver.  Take showers, not baths. Avoid pools and hot tubs. Before showering, cover the area with plastic wrap and tape the edges of the plastic wrap to your skin. This is done to keep your skin dry.  Keep the skin around the insertion site dry. If the area gets wet, dry the skin completely.  Keep all follow-up appointments. SEEK MEDICAL CARE IF:   Your redness, soreness, or swelling at the tube insertion site worsens despite good cleaning.  Your pain worsens after an initial improvement.  You have any questions about your tube. SEEK IMMEDIATE MEDICAL CARE IF:   You have a fever.  You have chills or increased pain.  Your skin breaks down around the tube.  You have leakage of bile around the tube.  Your tube becomes blocked or clogged. MAKE SURE YOU:   Understand these instructions.  Will watch your condition.  Will get help right away if you are not doing well or get worse. Document Released: 04/11/2004 Document Revised: 11/20/2011 Document Reviewed:  05/05/2013 [facility] Patient Information 2014 [place]. Conscious Sedation, Adult, Care After Refer to this sheet in the next few weeks. These instructions provide you with information on caring for yourself after your procedure. Your health care provider may also give you more specific instructions. Your treatment has been planned according to current medical practices, but problems sometimes occur. Call your health care provider if you have any problems or questions after your procedure. WHAT TO EXPECT AFTER THE PROCEDURE  After your procedure:  You may feel sleepy, clumsy, and have poor balance for several hours.  Vomiting may occur if you eat too soon after the procedure. HOME CARE INSTRUCTIONS  Do not participate in any activities where you could become injured for at least 24 hours. Do not:  Drive.  Swim.  Ride a bicycle.  Operate heavy machinery.  Cook.  Use power tools.  Climb ladders.  Work from a high place.  Do not make important decisions or sign legal documents until you are improved.  If you vomit, drink water, juice, or soup when you can drink without vomiting. Make sure you have little or no nausea before eating solid foods.  Only take over-the-counter or prescription medicines for pain, discomfort, or fever as directed by your health care provider.  Make sure you and your family fully understand everything about the medicines given to you, including what side effects may occur.  You should not drink alcohol, take sleeping pills, or take medicines that cause drowsiness for at least 24 hours.  If you smoke, do not smoke without supervision.  If you are feeling  better, you may resume normal activities 24 hours after you were sedated.  Keep all appointments with your health care provider. SEEK MEDICAL CARE IF:  Your skin is pale or bluish in color.  You continue to feel nauseous or vomit.  Your pain is getting worse and is not helped by  medicine.  You have bleeding or swelling.  You are still sleepy or feeling clumsy after 24 hours. SEEK IMMEDIATE MEDICAL CARE IF:  You develop a rash.  You have difficulty breathing.  You develop any type of allergic problem.  You have a fever. MAKE SURE YOU:  Understand these instructions.  Will watch your condition.  Will get help right away if you are not doing well or get worse. Document Released: 06/18/2013 Document Reviewed: 04/04/2013 [facility] Patient Information 2014 [place], [place].

## 2013-11-14 NOTE — Procedures (Signed)
Interventional Radiology Procedure Note  Procedure:  1.) Cholangiogram through existing tube 2.) Tube exchange, new tube modified with existing sideholes.  Complications: None Recommendations: - Bag drainage x 48 hrs - Cap at home Sunday evening - Return to IR in 8 weeks for tube check/change  Signed,  Criselda Peaches, MD [facility] Radiology

## 2013-11-14 NOTE — H&P (Signed)
Chief Complaint: "My drain has leaking around the site and blood within the bag." HPI: April Spence is an 58 y.o. female with metastatic colon cancer with biliary obstruction s/p internal/external biliary drain placed. The patient's most recent intervention included a biliary drain upsize to 20F and dilatation of biliary confluence secondary to leaking on 10/28/13. She presented after this on 3/4 for persistent leaking and a cholangiogram was performed which revealed accurate placement and the biliary tube widely patent. She is here today because she is having worsening abdominal pain and nausea as well as increased bile and blood leaking around the drain site, today the leaking has slightly improved and she has not seen any blood around site today. She rates her pain 5/10 today. She denies any lightheadedness, dizziness or palpitations. She denies any chest pain or shortness of breath. She denies any fever or chills. She has previously tolerated sedation without complications.   Past Medical History:  Past Medical History  Diagnosis Date  . colon ca dx'd 04/2009    chemo comp 11/2009.. colon and liver  . Spina bifida   . Scoliosis   . Hyperlipidemia   . Foot drop     bilateral,wears braces  . Mitral valve prolapse   . Hypothyroidism   . Depression   . History of chemotherapy     5-FU and Avastin 12 cycles FOLFOX  . Colon cancer 05/06/2010    metastatic  . Hepatic metastases 08/15/12    per ct abdomen  . Radiation 10/21/12-10/28/12    Palliative liver mets 54 gray in 3 fx's  . Arrhythmia 02-26-13    "rapid heart rate occ."  . History of radiation therapy 10/21/2012-10/28/2012    52 gray to liver metastasis  . History of radiation therapy 04/07/2013-04/24/2013    43 gray to liver metastasis    Past Surgical History:  Past Surgical History  Procedure Laterality Date  . Cholecystectomy    . Back surgery  1980 and 2010  . Knee arthroscopy  2005    left  . Tubal ligation  1973  .  Colonoscopy  05/05/2010  . Colon surgery      2010  . Portacath placement  05/30/2011    tip at cavoatrialjjunction by xray  . Ercp N/A 02/27/2013    Procedure: ENDOSCOPIC RETROGRADE CHOLANGIOPANCREATOGRAPHY (ERCP);  Surgeon: Milus Banister, MD;  Location: [facility];  Service: Endoscopy;  Laterality: N/A;  . Biliary stent placement N/A 02/27/2013    Procedure: BILIARY STENT PLACEMENT;  Surgeon: Milus Banister, MD;  Location: [facility];  Service: Endoscopy;  Laterality: N/A;  . Percutaneous transhepatic cholangiograhpy and billiary drainage  03/21/2013  . Dental work  09/2013    Family History:  Family History  Problem Relation Age of Onset  . Cancer Sister     breast  . Cancer Maternal Grandmother     breast  . Cancer Other 9    breast ca    Social History:  reports that she quit smoking about 7 years ago. She has never used smokeless tobacco. She reports that she does not drink alcohol or use illicit drugs.  Allergies:  Allergies  Allergen Reactions  . Adhesive [Tape]     Pt is fine with paper tape  . Codeine Nausea And Vomiting    Pt denies this    Medications:   Medication List    ASK your doctor about these medications       ALPRAZolam 0.25 MG tablet  Commonly known as:  XANAX  Take 0.25 mg by mouth 2 (two) times daily as needed for sleep or anxiety.     amitriptyline 25 MG tablet  Commonly known as:  ELAVIL  take 2 to 3 tablets by mouth once daily with SUPPER for CHRONIC back pain     chlorhexidine 0.12 % solution  Commonly known as:  PERIDEX  Use as directed 15 mLs in the mouth or throat 2 (two) times daily.     diphenoxylate-atropine 2.5-0.025 MG per tablet  Commonly known as:  LOMOTIL  Take 1 tablet by mouth 4 (four) times daily as needed for diarrhea or loose stools.     docusate sodium 100 MG capsule  Commonly known as:  COLACE  Take 100 mg by mouth 2 (two) times daily.     FLUoxetine 20 MG capsule  Commonly known as:  PROZAC  Take 20 mg  by mouth every morning.     levothyroxine 150 MCG tablet  Commonly known as:  SYNTHROID, LEVOTHROID  Take 150 mcg by mouth daily before breakfast.     lidocaine-prilocaine cream  Commonly known as:  EMLA  Apply 1 application topically as needed (use on port).     LORazepam 0.5 MG tablet  Commonly known as:  ATIVAN  Take 1 tablet (0.5 mg total) by mouth every 12 (twelve) hours.     magic mouthwash Soln  Take 10 mLs by mouth 4 (four) times daily as needed for mouth pain. Mouthsores     megestrol 40 MG/ML suspension  Commonly known as:  MEGACE ORAL  Take 5 mLs (200 mg total) by mouth 2 (two) times daily.     MIRALAX PO  Take 17 g by mouth daily. Mix in 8 ounces fluid     morphine 30 MG 12 hr tablet  Commonly known as:  MS CONTIN  Take 1 tablet (30 mg total) by mouth every 12 (twelve) hours.     nystatin 100000 UNIT/GM Powd  Apply 1 g topically daily as needed (for rash). In summer on irritated areas     ondansetron 8 MG tablet  Commonly known as:  ZOFRAN  Take 8 mg by mouth every 12 (twelve) hours as needed for nausea or vomiting.     oxyCODONE-acetaminophen 5-325 MG per tablet  Commonly known as:  PERCOCET/ROXICET  Take 2 tablets by mouth every 4 (four) hours as needed for moderate pain or severe pain.     prochlorperazine 10 MG tablet  Commonly known as:  COMPAZINE  Take 10 mg by mouth every 6 (six) hours as needed (nausea).     propranolol 80 MG tablet  Commonly known as:  INDERAL  Take 80 mg by mouth every morning.     Vitamin D-3 5000 UNITS Tabs  Take 5,000 Units by mouth 2 (two) times daily.       Please HPI for pertinent positives, otherwise complete 10 system ROS negative.  Physical Exam: BP 126/72  Pulse 93  Temp(Src) 98.9 F (37.2 C) (Oral)  Resp 16  SpO2 99% There is no weight on file to calculate BMI.  General Appearance:  Alert, cooperative, no distress, jaundice  Head:  Normocephalic, without obvious abnormality, atraumatic  Neck: Supple,  symmetrical, trachea midline  Lungs:   Clear to auscultation bilaterally, no w/r/r, respirations unlabored without use of accessory muscles.  Chest Wall:  No tenderness or deformity  Heart:  Regular rate and rhythm, S1, S2 normal, no murmur, rub or gallop.  Abdomen:   Soft, slight tenderness,  non distended, (+) BS, left biliary drain intact, dressing slightly soiled with green bile, no bleeding.   Extremities: Bilateral foot drop, atraumatic, no cyanosis or edema  Neurologic: Normal affect, no gross deficits.   Results for orders placed during the hospital encounter of 11/14/13 (from the past 48 hour(s))  CBC     Status: Abnormal   Collection Time    11/14/13  1:30 PM      Result Value Ref Range   WBC 9.9  4.0 - 10.5 K/uL   RBC 3.21 (*) 3.87 - 5.11 MIL/uL   Hemoglobin 9.5 (*) 12.0 - 15.0 g/dL   HCT 29.6 (*) 36.0 - 46.0 %   MCV 92.2  78.0 - 100.0 fL   MCH 29.6  26.0 - 34.0 pg   MCHC 32.1  30.0 - 36.0 g/dL   RDW 17.4 (*) 11.5 - 15.5 %   Platelets 184  150 - 400 K/uL  COMPREHENSIVE METABOLIC PANEL     Status: Abnormal   Collection Time    11/14/13  1:30 PM      Result Value Ref Range   Sodium 134 (*) 137 - 147 mEq/L   Potassium 3.8  3.7 - 5.3 mEq/L   Chloride 98  96 - 112 mEq/L   CO2 24  19 - 32 mEq/L   Glucose, Bld 129 (*) 70 - 99 mg/dL   BUN 12  6 - 23 mg/dL   Creatinine, Ser 0.28 (*) 0.50 - 1.10 mg/dL   Calcium 9.7  8.4 - 10.5 mg/dL   Total Protein 8.6 (*) 6.0 - 8.3 g/dL   Albumin 2.2 (*) 3.5 - 5.2 g/dL   AST 28  0 - 37 U/L   ALT 17  0 - 35 U/L   Alkaline Phosphatase 218 (*) 39 - 117 U/L   Total Bilirubin 4.3 (*) 0.3 - 1.2 mg/dL   GFR calc non Af Amer >90  >90 mL/min   GFR calc Af Amer >90  >90 mL/min   Comment: (NOTE)     The eGFR has been calculated using the CKD EPI equation.     This calculation has not been validated in all clinical situations.     eGFR's persistently <90 mL/min signify possible Chronic Kidney     Disease.  PROTIME-INR     Status: Abnormal    Collection Time    11/14/13  1:30 PM      Result Value Ref Range   Prothrombin Time 19.5 (*) 11.6 - 15.2 seconds   INR 1.70 (*) 0.00 - 1.49   No results found.  Assessment/Plan Metastatic colon cancer with biliary obstruction s/p internal/external biliary drain placed, bilirubin 4.3 today, INR elevated at 1.7 today. 10/28/13 biliary drain upsize to 74F and dilatation of biliary confluence. 11/12/13 cholangiogram without intervention. Worsening abdominal pain. Nausea. Biliary drain with excessive leaking of bile/blood. Scheduled today for image guided cholangiogram with possible drain manipulation, side hole placement or drain exchange.  Patient has been NPO, labs reviewed, Zosyn ordered. Risks and Benefits discussed with the patient. All of the patient's questions were answered, patient is agreeable to proceed. Consent signed and in chart.   Tsosie Billing D PA-C 11/14/2013, 2:20 PM

## 2013-11-16 LAB — BODY FLUID CULTURE: SPECIAL REQUESTS: NORMAL

## 2013-11-17 ENCOUNTER — Other Ambulatory Visit (HOSPITAL_COMMUNITY): Payer: Self-pay | Admitting: Interventional Radiology

## 2013-11-17 DIAGNOSIS — C787 Secondary malignant neoplasm of liver and intrahepatic bile duct: Secondary | ICD-10-CM

## 2013-11-19 ENCOUNTER — Encounter (HOSPITAL_COMMUNITY): Payer: Self-pay | Admitting: Emergency Medicine

## 2013-11-19 ENCOUNTER — Encounter: Payer: Self-pay | Admitting: Internal Medicine

## 2013-11-19 ENCOUNTER — Telehealth: Payer: Self-pay | Admitting: *Deleted

## 2013-11-19 ENCOUNTER — Inpatient Hospital Stay (HOSPITAL_COMMUNITY)
Admission: EM | Admit: 2013-11-19 | Discharge: 2013-11-24 | DRG: 380 | Disposition: A | Discharge status: Home / Self Care | Payer: Medicare HMO | Attending: Internal Medicine | Admitting: Internal Medicine

## 2013-11-19 DIAGNOSIS — Z87891 Personal history of nicotine dependence: Secondary | ICD-10-CM

## 2013-11-19 DIAGNOSIS — C787 Secondary malignant neoplasm of liver and intrahepatic bile duct: Secondary | ICD-10-CM | POA: Diagnosis present

## 2013-11-19 DIAGNOSIS — D649 Anemia, unspecified: Secondary | ICD-10-CM

## 2013-11-19 DIAGNOSIS — Q059 Spina bifida, unspecified: Secondary | ICD-10-CM

## 2013-11-19 DIAGNOSIS — Z9221 Personal history of antineoplastic chemotherapy: Secondary | ICD-10-CM

## 2013-11-19 DIAGNOSIS — Z923 Personal history of irradiation: Secondary | ICD-10-CM

## 2013-11-19 DIAGNOSIS — E039 Hypothyroidism, unspecified: Secondary | ICD-10-CM | POA: Diagnosis present

## 2013-11-19 DIAGNOSIS — M216X9 Other acquired deformities of unspecified foot: Secondary | ICD-10-CM | POA: Diagnosis present

## 2013-11-19 DIAGNOSIS — R7309 Other abnormal glucose: Secondary | ICD-10-CM | POA: Insufficient documentation

## 2013-11-19 DIAGNOSIS — E785 Hyperlipidemia, unspecified: Secondary | ICD-10-CM | POA: Diagnosis present

## 2013-11-19 DIAGNOSIS — K209 Esophagitis, unspecified without bleeding: Secondary | ICD-10-CM | POA: Diagnosis present

## 2013-11-19 DIAGNOSIS — F329 Major depressive disorder, single episode, unspecified: Secondary | ICD-10-CM | POA: Diagnosis present

## 2013-11-19 DIAGNOSIS — Z803 Family history of malignant neoplasm of breast: Secondary | ICD-10-CM

## 2013-11-19 DIAGNOSIS — M4120 Other idiopathic scoliosis, site unspecified: Secondary | ICD-10-CM | POA: Diagnosis present

## 2013-11-19 DIAGNOSIS — L98499 Non-pressure chronic ulcer of skin of other sites with unspecified severity: Secondary | ICD-10-CM | POA: Diagnosis present

## 2013-11-19 DIAGNOSIS — Z79899 Other long term (current) drug therapy: Secondary | ICD-10-CM | POA: Insufficient documentation

## 2013-11-19 DIAGNOSIS — D630 Anemia in neoplastic disease: Secondary | ICD-10-CM | POA: Diagnosis present

## 2013-11-19 DIAGNOSIS — D72829 Elevated white blood cell count, unspecified: Secondary | ICD-10-CM | POA: Diagnosis present

## 2013-11-19 DIAGNOSIS — F3289 Other specified depressive episodes: Secondary | ICD-10-CM | POA: Diagnosis present

## 2013-11-19 DIAGNOSIS — IMO0002 Reserved for concepts with insufficient information to code with codable children: Secondary | ICD-10-CM

## 2013-11-19 DIAGNOSIS — I10 Essential (primary) hypertension: Secondary | ICD-10-CM | POA: Insufficient documentation

## 2013-11-19 DIAGNOSIS — D638 Anemia in other chronic diseases classified elsewhere: Secondary | ICD-10-CM

## 2013-11-19 DIAGNOSIS — E559 Vitamin D deficiency, unspecified: Secondary | ICD-10-CM | POA: Insufficient documentation

## 2013-11-19 DIAGNOSIS — C170 Malignant neoplasm of duodenum: Secondary | ICD-10-CM | POA: Diagnosis present

## 2013-11-19 DIAGNOSIS — C189 Malignant neoplasm of colon, unspecified: Secondary | ICD-10-CM

## 2013-11-19 DIAGNOSIS — R112 Nausea with vomiting, unspecified: Secondary | ICD-10-CM

## 2013-11-19 DIAGNOSIS — K311 Adult hypertrophic pyloric stenosis: Secondary | ICD-10-CM | POA: Diagnosis present

## 2013-11-19 DIAGNOSIS — I059 Rheumatic mitral valve disease, unspecified: Secondary | ICD-10-CM | POA: Diagnosis present

## 2013-11-19 DIAGNOSIS — K831 Obstruction of bile duct: Secondary | ICD-10-CM | POA: Diagnosis present

## 2013-11-19 DIAGNOSIS — K315 Obstruction of duodenum: Principal | ICD-10-CM | POA: Diagnosis present

## 2013-11-19 DIAGNOSIS — E876 Hypokalemia: Secondary | ICD-10-CM | POA: Diagnosis present

## 2013-11-19 DIAGNOSIS — Z0000 Encounter for general adult medical examination without abnormal findings: Secondary | ICD-10-CM

## 2013-11-19 DIAGNOSIS — E43 Unspecified severe protein-calorie malnutrition: Secondary | ICD-10-CM | POA: Diagnosis present

## 2013-11-19 DIAGNOSIS — C180 Malignant neoplasm of cecum: Secondary | ICD-10-CM | POA: Diagnosis present

## 2013-11-19 HISTORY — DX: Essential (primary) hypertension: I10

## 2013-11-19 LAB — URINALYSIS, ROUTINE W REFLEX MICROSCOPIC
GLUCOSE, UA: NEGATIVE mg/dL
Hgb urine dipstick: NEGATIVE
KETONES UR: NEGATIVE mg/dL
NITRITE: NEGATIVE
Protein, ur: 30 mg/dL — AB
Specific Gravity, Urine: 1.018 (ref 1.005–1.030)
Urobilinogen, UA: 1 mg/dL (ref 0.0–1.0)
pH: 8 (ref 5.0–8.0)

## 2013-11-19 LAB — COMPREHENSIVE METABOLIC PANEL
ALBUMIN: 2.5 g/dL — AB (ref 3.5–5.2)
ALK PHOS: 301 U/L — AB (ref 39–117)
ALT: 25 U/L (ref 0–35)
AST: 45 U/L — ABNORMAL HIGH (ref 0–37)
BUN: 10 mg/dL (ref 6–23)
CALCIUM: 10 mg/dL (ref 8.4–10.5)
CO2: 28 mEq/L (ref 19–32)
Chloride: 94 mEq/L — ABNORMAL LOW (ref 96–112)
Creatinine, Ser: 0.32 mg/dL — ABNORMAL LOW (ref 0.50–1.10)
GFR calc non Af Amer: >90 mL/min (ref >90)
Glucose, Bld: 179 mg/dL — ABNORMAL HIGH (ref 70–99)
POTASSIUM: 3.1 meq/L — AB (ref 3.7–5.3)
SODIUM: 137 meq/L (ref 137–147)
TOTAL PROTEIN: 9.7 g/dL — AB (ref 6.0–8.3)
Total Bilirubin: 4.7 mg/dL — ABNORMAL HIGH (ref 0.3–1.2)

## 2013-11-19 LAB — CBC WITH DIFFERENTIAL/PLATELET
BASOS PCT: 0 % (ref 0–1)
Basophils Absolute: 0 10*3/uL (ref 0.0–0.1)
EOS ABS: 0.2 10*3/uL (ref 0.0–0.7)
EOS PCT: 1 % (ref 0–5)
HCT: 33.9 % — ABNORMAL LOW (ref 36.0–46.0)
Hemoglobin: 11.2 g/dL — ABNORMAL LOW (ref 12.0–15.0)
Lymphocytes Relative: 7 % — ABNORMAL LOW (ref 12–46)
Lymphs Abs: 0.9 10*3/uL (ref 0.7–4.0)
MCH: 30 pg (ref 26.0–34.0)
MCHC: 33 g/dL (ref 30.0–36.0)
MCV: 90.9 fL (ref 78.0–100.0)
Monocytes Absolute: 1.2 10*3/uL — ABNORMAL HIGH (ref 0.1–1.0)
Monocytes Relative: 8 % (ref 3–12)
NEUTROS PCT: 84 % — AB (ref 43–77)
Neutro Abs: 11.9 10*3/uL — ABNORMAL HIGH (ref 1.7–7.7)
PLATELETS: 323 10*3/uL (ref 150–400)
RBC: 3.73 MIL/uL — ABNORMAL LOW (ref 3.87–5.11)
RDW: 17.1 % — ABNORMAL HIGH (ref 11.5–15.5)
WBC: 14.3 10*3/uL — ABNORMAL HIGH (ref 4.0–10.5)

## 2013-11-19 LAB — URINE MICROSCOPIC-ADD ON

## 2013-11-19 LAB — LIPASE, BLOOD: Lipase: 86 U/L — ABNORMAL HIGH (ref 11–59)

## 2013-11-19 LAB — I-STAT TROPONIN, ED: Troponin i, poc: 0 ng/mL (ref 0.00–0.08)

## 2013-11-19 MED ORDER — SODIUM CHLORIDE 0.9 % IV SOLN
Freq: Once | INTRAVENOUS | Status: AC
  Administered 2013-11-20 (×2): via INTRAVENOUS

## 2013-11-19 NOTE — Telephone Encounter (Signed)
Spoke with husband : Took Compazine at 1 pm and again just now. Still nauseated. Has also had more abdominal pain today. Had BM this afternoon and says "it didn't have any color to it-was kind of white". Had biliary drain exchanged on 3/6 and external drain is capped off. Instructed him to open it to the drainage bag and see if this helps. Also take a Zofran now-do not take Compazine any sooner than 6 hours apart. Go to ER if she does not feel better or if she spikes fever. Could be having malfunction of her internal drain. He understands and agrees.

## 2013-11-19 NOTE — Telephone Encounter (Signed)
VM from husband: more nausea and vomited today. Recently had dry heaves. Has taken Compazine twice already.

## 2013-11-19 NOTE — ED Notes (Addendum)
Pt reports abd pain and n/v that began approx 1300 today - pt w/ recently being treated for colon and liver CA - last chemo was in August 2014 - pt denies any known fever or diarrhea - pt has taken zofran and compazine at home w/o relief. Pt referred to ED by Dr. Johnanna Schneiders - oncologist.  Pt also reports recent biliary drain change on Friday.

## 2013-11-19 NOTE — ED Notes (Signed)
EKG given to EDP, Maryan Rued, MD. For review.

## 2013-11-19 NOTE — Progress Notes (Signed)
Error

## 2013-11-20 ENCOUNTER — Emergency Department (HOSPITAL_COMMUNITY): Payer: Medicare HMO

## 2013-11-20 ENCOUNTER — Encounter (HOSPITAL_COMMUNITY): Payer: Self-pay

## 2013-11-20 DIAGNOSIS — D649 Anemia, unspecified: Secondary | ICD-10-CM | POA: Diagnosis present

## 2013-11-20 DIAGNOSIS — K311 Adult hypertrophic pyloric stenosis: Secondary | ICD-10-CM

## 2013-11-20 DIAGNOSIS — C787 Secondary malignant neoplasm of liver and intrahepatic bile duct: Secondary | ICD-10-CM

## 2013-11-20 DIAGNOSIS — C189 Malignant neoplasm of colon, unspecified: Secondary | ICD-10-CM

## 2013-11-20 DIAGNOSIS — E876 Hypokalemia: Secondary | ICD-10-CM | POA: Diagnosis present

## 2013-11-20 DIAGNOSIS — E43 Unspecified severe protein-calorie malnutrition: Secondary | ICD-10-CM | POA: Diagnosis present

## 2013-11-20 DIAGNOSIS — D638 Anemia in other chronic diseases classified elsewhere: Secondary | ICD-10-CM

## 2013-11-20 LAB — CBC WITH DIFFERENTIAL/PLATELET
BASOS PCT: 0 % (ref 0–1)
Basophils Absolute: 0 10*3/uL (ref 0.0–0.1)
EOS PCT: 0 % (ref 0–5)
Eosinophils Absolute: 0 10*3/uL (ref 0.0–0.7)
HCT: 30.4 % — ABNORMAL LOW (ref 36.0–46.0)
Hemoglobin: 9.9 g/dL — ABNORMAL LOW (ref 12.0–15.0)
LYMPHS ABS: 0.8 10*3/uL (ref 0.7–4.0)
Lymphocytes Relative: 8 % — ABNORMAL LOW (ref 12–46)
MCH: 29.6 pg (ref 26.0–34.0)
MCHC: 32.6 g/dL (ref 30.0–36.0)
MCV: 91 fL (ref 78.0–100.0)
Monocytes Absolute: 0.8 10*3/uL (ref 0.1–1.0)
Monocytes Relative: 8 % (ref 3–12)
NEUTROS PCT: 84 % — AB (ref 43–77)
Neutro Abs: 8.7 10*3/uL — ABNORMAL HIGH (ref 1.7–7.7)
Platelets: 255 10*3/uL (ref 150–400)
RBC: 3.34 MIL/uL — AB (ref 3.87–5.11)
RDW: 17.1 % — AB (ref 11.5–15.5)
WBC: 10.4 10*3/uL (ref 4.0–10.5)

## 2013-11-20 LAB — COMPREHENSIVE METABOLIC PANEL
ALT: 22 U/L (ref 0–35)
AST: 38 U/L — AB (ref 0–37)
Albumin: 2.4 g/dL — ABNORMAL LOW (ref 3.5–5.2)
Alkaline Phosphatase: 265 U/L — ABNORMAL HIGH (ref 39–117)
BILIRUBIN TOTAL: 3.9 mg/dL — AB (ref 0.3–1.2)
BUN: 11 mg/dL (ref 6–23)
CO2: 31 meq/L (ref 19–32)
CREATININE: 0.35 mg/dL — AB (ref 0.50–1.10)
Calcium: 9.7 mg/dL (ref 8.4–10.5)
Chloride: 96 mEq/L (ref 96–112)
GFR calc Af Amer: >90 mL/min (ref >90)
Glucose, Bld: 130 mg/dL — ABNORMAL HIGH (ref 70–99)
Potassium: 3 mEq/L — ABNORMAL LOW (ref 3.7–5.3)
Sodium: 140 mEq/L (ref 137–147)
Total Protein: 8.7 g/dL — ABNORMAL HIGH (ref 6.0–8.3)

## 2013-11-20 LAB — GLUCOSE, CAPILLARY
Glucose-Capillary: 129 mg/dL — ABNORMAL HIGH (ref 70–99)
Glucose-Capillary: 135 mg/dL — ABNORMAL HIGH (ref 70–99)

## 2013-11-20 MED ORDER — ACETAMINOPHEN 650 MG RE SUPP: 650.0000 mg | Freq: Four times a day (QID) | RECTAL | Status: DC | PRN

## 2013-11-20 MED ORDER — ONDANSETRON HCL 4 MG/2ML IJ SOLN: 4.0000 mg | Freq: Three times a day (TID) | INTRAMUSCULAR | Status: DC | PRN

## 2013-11-20 MED ORDER — IOHEXOL 300 MG/ML  SOLN
100.0000 mL | Freq: Once | INTRAMUSCULAR | Status: AC | PRN
  Administered 2013-11-20: 100 mL via INTRAVENOUS

## 2013-11-20 MED ORDER — LORAZEPAM 2 MG/ML IJ SOLN
1.0000 mg | Freq: Four times a day (QID) | INTRAMUSCULAR | Status: DC | PRN
  Administered 2013-11-20 – 2013-11-22 (×3): 1 mg via INTRAVENOUS
  Filled 2013-11-20 (×3): qty 1

## 2013-11-20 MED ORDER — HYDROMORPHONE HCL PF 1 MG/ML IJ SOLN
1.0000 mg | Freq: Once | INTRAMUSCULAR | Status: AC
  Administered 2013-11-20: 1 mg via INTRAVENOUS
  Filled 2013-11-20: qty 1

## 2013-11-20 MED ORDER — METOPROLOL TARTRATE 1 MG/ML IV SOLN
5.0000 mg | Freq: Four times a day (QID) | INTRAVENOUS | Status: DC
  Administered 2013-11-20 – 2013-11-23 (×13): 5 mg via INTRAVENOUS
  Filled 2013-11-20 (×19): qty 5

## 2013-11-20 MED ORDER — ONDANSETRON HCL 4 MG/2ML IJ SOLN
4.0000 mg | Freq: Once | INTRAMUSCULAR | Status: AC
  Administered 2013-11-20: 4 mg via INTRAVENOUS
  Filled 2013-11-20: qty 2

## 2013-11-20 MED ORDER — HYDROMORPHONE HCL PF 1 MG/ML IJ SOLN
1.0000 mg | INTRAMUSCULAR | Status: DC | PRN
  Administered 2013-11-20 – 2013-11-24 (×19): 1 mg via INTRAVENOUS
  Filled 2013-11-20 (×19): qty 1

## 2013-11-20 MED ORDER — PROMETHAZINE HCL 25 MG/ML IJ SOLN
6.2500 mg | INTRAMUSCULAR | Status: DC | PRN
  Administered 2013-11-20: 6.25 mg via INTRAVENOUS
  Filled 2013-11-20: qty 1

## 2013-11-20 MED ORDER — POTASSIUM CHLORIDE IN NACL 20-0.9 MEQ/L-% IV SOLN
INTRAVENOUS | Status: AC
  Administered 2013-11-20: 11:00:00 via INTRAVENOUS
  Administered 2013-11-20 – 2013-11-21 (×2): 100 mL via INTRAVENOUS
  Filled 2013-11-20 (×4): qty 1000

## 2013-11-20 MED ORDER — ACETAMINOPHEN 325 MG PO TABS: 650.0000 mg | ORAL_TABLET | Freq: Four times a day (QID) | ORAL | Status: DC | PRN

## 2013-11-20 MED ORDER — ENOXAPARIN SODIUM 40 MG/0.4ML ~~LOC~~ SOLN
40.0000 mg | SUBCUTANEOUS | Status: DC
  Administered 2013-11-20 – 2013-11-23 (×4): 40 mg via SUBCUTANEOUS
  Filled 2013-11-20 (×5): qty 0.4

## 2013-11-20 MED ORDER — HYDROMORPHONE HCL PF 1 MG/ML IJ SOLN: 1.0000 mg | INTRAMUSCULAR | Status: DC | PRN

## 2013-11-20 MED ORDER — IOHEXOL 300 MG/ML  SOLN
50.0000 mL | Freq: Once | INTRAMUSCULAR | Status: AC | PRN
  Administered 2013-11-20: 50 mL via ORAL

## 2013-11-20 MED ORDER — LEVOTHYROXINE SODIUM 100 MCG IV SOLR
75.0000 ug | Freq: Every day | INTRAVENOUS | Status: DC
  Administered 2013-11-20 – 2013-11-24 (×5): 75 ug via INTRAVENOUS
  Filled 2013-11-20 (×7): qty 5

## 2013-11-20 MED ORDER — ONDANSETRON HCL 4 MG PO TABS: 4.0000 mg | ORAL_TABLET | Freq: Four times a day (QID) | ORAL | Status: DC | PRN

## 2013-11-20 MED ORDER — ONDANSETRON HCL 4 MG/2ML IJ SOLN: 4.0000 mg | Freq: Four times a day (QID) | INTRAMUSCULAR | Status: DC | PRN

## 2013-11-20 NOTE — Consult Note (Signed)
Consultation  Referring Provider: [facility]      Primary Care Physician:  Alesia Richards, MD Primary Gastroenterologist:   Oretha Caprice, MD      Reason for Consultation:  Gastric outlet obstruction            HPI:    Patient is a 58 year old female with a history of metastatic cecal cancer, s/p right colectomy 2010. She has been treated with chemotherapy, last dose in December. She is s/p radiation in August. Patient  Patient is known to Dr. Ardis Hughs in our practice. She is s/p percutaneous biliary stent placement by IR July 2014 followed by eventual conversion to internal / external drain. IR has since been doing drain exchanges as needed.    Patient has chronic nausea which she attributes to chemotherapy. Over the last few weeks nausea has progressed. She has been unable to eat much, despite Megace and her weight is slowly declining. At home yesterday patient developed vomiting and escalation of abdominal pain.  CTscan shows marked gastric and proximal duodenal dilation. NG tube place, 1600 cc fluid removed. She is feeling much better.  BMs are baseline for now  Past Medical History  Diagnosis Date  . colon ca dx'd 04/2009    chemo comp 11/2009.. colon and liver  . Spina bifida   . Scoliosis   . Hyperlipidemia   . Foot drop     bilateral,wears braces  . Mitral valve prolapse   . Hypothyroidism   . Depression   . History of chemotherapy     5-FU and Avastin 12 cycles FOLFOX  . Colon cancer 05/06/2010    metastatic  . Hepatic metastases 08/15/12    per ct abdomen  . Radiation 10/21/12-10/28/12    Palliative liver mets 54 gray in 3 fx's  . Arrhythmia 02-26-13    "rapid heart rate occ."  . History of radiation therapy 10/21/2012-10/28/2012    54 gray to liver metastasis  . History of radiation therapy 04/07/2013-04/24/2013    45 gray to liver metastasis  . Hypertension 11/19/2013    Past Surgical History  Procedure Laterality Date  . Cholecystectomy    . Back  surgery  1980 and 2010  . Knee arthroscopy  2005    left  . Tubal ligation  1973  . Colonoscopy  05/05/2010  . Colon surgery      2010  . Portacath placement  05/30/2011    tip at cavoatrialjjunction by xray  . Ercp N/A 02/27/2013    Procedure: ENDOSCOPIC RETROGRADE CHOLANGIOPANCREATOGRAPHY (ERCP);  Surgeon: Milus Banister, MD;  Location: [facility];  Service: Endoscopy;  Laterality: N/A;  . Biliary stent placement N/A 02/27/2013    Procedure: BILIARY STENT PLACEMENT;  Surgeon: Milus Banister, MD;  Location: [facility];  Service: Endoscopy;  Laterality: N/A;  . Percutaneous transhepatic cholangiograhpy and billiary drainage  03/21/2013  . Dental work  09/2013    Family History  Problem Relation Age of Onset  . Cancer Sister     breast  . Cancer Maternal Grandmother     breast  . Cancer Other 42    breast ca     History  Substance Use Topics  . Smoking status: Former Smoker    Quit date: 06/27/2006  . Smokeless tobacco: Never Used  . Alcohol Use: No    Prior to Admission medications   Medication Sig Start Date End Date Taking? Authorizing Provider  ALPRAZolam Duanne Moron) 0.25 MG tablet Take 0.25 mg by  mouth 2 (two) times daily as needed for sleep or anxiety.   Yes Historical Provider, MD  Alum & Mag Hydroxide-Simeth (MAGIC MOUTHWASH) SOLN Take 10 mLs by mouth 4 (four) times daily as needed for mouth pain. Mouthsores   Yes Historical Provider, MD  amitriptyline (ELAVIL) 25 MG tablet Take 50-75 mg by mouth every evening. As needed for chronic back pain   Yes Historical Provider, MD  chlorhexidine (PERIDEX) 0.12 % solution Use as directed 15 mLs in the mouth or throat 2 (two) times daily.  02/20/13  Yes Historical Provider, MD  Cholecalciferol (VITAMIN D-3) 5000 UNITS TABS Take 5,000 Units by mouth 2 (two) times daily.    Yes Historical Provider, MD  diphenoxylate-atropine (LOMOTIL) 2.5-0.025 MG per tablet Take 1 tablet by mouth 4 (four) times daily as needed for diarrhea or loose  stools. 04/18/13  Yes Ladell Pier, MD  docusate sodium (COLACE) 100 MG capsule Take 100 mg by mouth 2 (two) times daily.    Yes Historical Provider, MD  FLUoxetine (PROZAC) 20 MG capsule Take 20 mg by mouth every morning.  03/30/11  Yes Historical Provider, MD  levothyroxine (SYNTHROID, LEVOTHROID) 150 MCG tablet Take 150 mcg by mouth daily before breakfast.   Yes Historical Provider, MD  lidocaine-prilocaine (EMLA) cream Apply 1 application topically as needed (use on port).   Yes Historical Provider, MD  LORazepam (ATIVAN) 0.5 MG tablet Take 1 tablet (0.5 mg total) by mouth every 12 (twelve) hours. 11/10/13  Yes Melissa R Golson, PA-C  megestrol (MEGACE ORAL) 40 MG/ML suspension Take 5 mLs (200 mg total) by mouth 2 (two) times daily. 10/28/13  Yes Owens Shark, NP  morphine (MS CONTIN) 30 MG 12 hr tablet Take 1 tablet (30 mg total) by mouth every 12 (twelve) hours. 11/10/13  Yes Ladell Pier, MD  ondansetron (ZOFRAN) 8 MG tablet Take 8 mg by mouth every 12 (twelve) hours as needed for nausea or vomiting.   Yes Historical Provider, MD  prochlorperazine (COMPAZINE) 10 MG tablet Take 10 mg by mouth every 6 (six) hours as needed (nausea).  07/12/11  Yes Ladell Pier, MD  propranolol (INDERAL) 80 MG tablet Take 80 mg by mouth every morning.  04/18/11  Yes Historical Provider, MD  nystatin (NYSTOP) 100000 UNIT/GM POWD Apply 1 g topically daily as needed (for rash). In summer on irritated areas 07/11/11   Historical Provider, MD  oxyCODONE-acetaminophen (PERCOCET/ROXICET) 5-325 MG per tablet Take 2 tablets by mouth every 4 (four) hours as needed for moderate pain or severe pain. 11/12/13   Ladell Pier, MD  traMADol-acetaminophen (ULTRACET) 37.5-325 MG per tablet Take 1 tablet by mouth every 6 (six) hours as needed for moderate pain.  10/17/13   Historical Provider, MD    Current Facility-Administered Medications  Medication Dose Route Frequency Provider Last Rate Last Dose  . 0.9 % NaCl with KCl 20 mEq/  L  infusion   Intravenous Continuous Rise Patience, MD 100 mL/hr at 11/20/13 1108    . acetaminophen (TYLENOL) tablet 650 mg  650 mg Oral Q6H PRN Rise Patience, MD       Or  . acetaminophen (TYLENOL) suppository 650 mg  650 mg Rectal Q6H PRN Rise Patience, MD      . enoxaparin (LOVENOX) injection 40 mg  40 mg Subcutaneous Q24H Rise Patience, MD   40 mg at 11/20/13 1108  . HYDROmorphone (DILAUDID) injection 1 mg  1 mg Intravenous Q3H PRN Rise Patience,  MD   1 mg at 11/20/13 1304  . levothyroxine (SYNTHROID, LEVOTHROID) injection 75 mcg  75 mcg Intravenous QAC breakfast Rise Patience, MD   75 mcg at 11/20/13 1108  . LORazepam (ATIVAN) injection 1 mg  1 mg Intravenous Q6H PRN Robbie Lis, MD   1 mg at 11/20/13 0947  . metoprolol (LOPRESSOR) injection 5 mg  5 mg Intravenous 4 times per day Rise Patience, MD   5 mg at 11/20/13 1206  . ondansetron (ZOFRAN) injection 4 mg  4 mg Intravenous Q6H PRN Rise Patience, MD      . promethazine (PHENERGAN) injection 6.25 mg  6.25 mg Intravenous Q4H PRN Varney Biles, MD   6.25 mg at 11/20/13 0134    Allergies as of 11/19/2013 - Review Complete 11/19/2013  Allergen Reaction Noted  . Adhesive [tape]  05/22/2011  . Codeine Nausea And Vomiting 08/31/2013   Review of Systems:    All systems reviewed and negative except where noted in HPI.   Physical Exam:  Vital signs in last 24 hours: Temp:  [98.3 F (36.8 C)-99.1 F (37.3 C)] 98.3 F (36.8 C) (03/12 1000) Pulse Rate:  [96-112] 112 (03/12 1000) Resp:  [18] 18 (03/12 1000) BP: (107-161)/(62-79) 107/62 mmHg (03/12 1000) SpO2:  [93 %-96 %] 93 % (03/12 1000) Weight:  [115 lb 1.3 oz (52.2 kg)] 115 lb 1.3 oz (52.2 kg) (03/12 0704)   General:   Pleasant white female in NAD Head:  Normocephalic and atraumatic. NGT present. Eyes:   Icteric sclera Ears:  Normal auditory acuity. Neck:  Supple; no masses felt Lungs:  Respirations even and unlabored. Lungs clear  to auscultation bilaterally.   No wheezes, crackles, or rhonchi.  Heart:  Tachycardic. Abdomen:  Soft, nondistended with mild diffuse tenderness. Bowel sound assessment unreliable as tube just clamped for restroom break. Perc biliary drain present.   Rectal:  Not performed.  Msk:  Symmetrical without gross deformities.  Extremities:  Without edema. Neurologic:  Alert and  oriented x4;  grossly normal neurologically. Skin:  Intact without significant lesions or rashes. Cervical Nodes:  No significant cervical adenopathy. Psych:  Alert and cooperative. Normal affect.  LAB RESULTS:  Recent Labs  11/19/13 2035 11/20/13 0800  WBC 14.3* PENDING  10.4  HGB 11.2* PENDING  9.9*  HCT 33.9* PENDING  30.4*  PLT 323 PENDING  255   BMET  Recent Labs  11/19/13 2035 11/20/13 0800  NA 137 140  K 3.1* 3.0*  CL 94* 96  CO2 28 31  GLUCOSE 179* 130*  BUN 10 11  CREATININE 0.32* 0.35*  CALCIUM 10.0 9.7   LFT  Recent Labs  11/20/13 0800  PROT 8.7*  ALBUMIN 2.4*  AST 38*  ALT 22  ALKPHOS 265*  BILITOT 3.9*   STUDIES: Ct Abdomen Pelvis W Contrast  11/20/2013   CLINICAL DATA Abdominal pain  EXAM CT ABDOMEN AND PELVIS WITH CONTRAST  TECHNIQUE Multidetector CT imaging of the abdomen and pelvis was performed using the standard protocol following bolus administration of intravenous contrast.  CONTRAST 18mL OMNIPAQUE IOHEXOL 300 MG/ML  SOLN  COMPARISON 08/12/2013 PET-CT, 03/11/2013 abdominal CT  FINDINGS Normal heart size. Lung bases predominantly clear. Partially imaged catheter tip near the cavoatrial junction. Coronary artery calcifications.  Left percutaneous transhepatic biliary drainage catheter and an internalized biliary drain remain in place. Large hypovascular mass within the liver centrally measuring 5.8 x 4.5 cm. There is mild biliary ductal dilatation peripheral to the mass so considerably improved  as compared to July 2014 though slightly increased from December. There are couple  sub cm lesions within the left hepatic lobe, not confidently seen in July as index series 2, image 24 and 32.  No appreciable abnormality of spleen, pancreas, adrenal glands. Mild heterogeneous enhancement of the right kidney (series 7, image 10 as index). Symmetric excretion.  Decompressed colon limits evaluation. Partial colonic resection with suture material in the right upper quadrant. No overt colitis. The jejunal and ileal loops are decompressed. There is marked distention of the stomach and proximal duodenum with transition along the horizontal segment.  Aorta is of normal caliber with mild scattered atherosclerotic disease. There may be paraesophageal varices. Question gastrohepatic adenopathy up to 1.7 cm.  Thin walled bladder. Pelvic floor laxity. Prominent pelvic collateral vessels. Probable exophytic fibroid. No definite adnexal mass.  Unchanged osseous deformity of the pelvis and curvature of the spine.  IMPRESSION Marked gastric and proximal duodenal distention to the level of the horizontal segment. Correlate clinically if concerned for obstruction.  Central hepatic mass with mild residual intrahepatic biliary ductal dilatation, similar to mildly increased from December (though markedly improved from July).  A couple sub cm left hepatic lobe lesions are suspicious for metastatic disease and not definitively seen on the prior. Prominent gastrohepatic lymph node is also suspicious for malignancy.  Mild heterogeneous enhancement of the right kidney on the delayed phase is nonspecific and may reflect sequelae of prior treatment/radiation. Ascending infection not excluded. Correlate with urinalysis.  SIGNATURE  Electronically Signed   By: Carlos Levering M.D.   On: 11/20/2013 03:22     Impression / Plan:   42. 58 year old female with metastatic colon cancer, s/p right colectomy 2010. She has been treated with chemotherapy, last dose in December. She is s/p radiation in August. Followed by Dr.  Benay Spice  2. History of biliary obstruction secondary to #1, currently has internal / external drain managed by IR. Bilirubin stable at 3.9. She had drain exchange done 11/14/13.  3. Proximal SBO. Marked distention of stomach and proximal duodenum on CTscan. Currently feels better with NGT decompression. She will need EGD for further evaluation. If proves to be a malignant stricture then luminal stent placement might be an option. Surgery is following as well.    Thanks   LOS: 1 day   Tye Savoy  11/20/2013, 2:26 PM  Attending MD note:   I have taken a history, , and reviewed the chart. And the CT scan and I agree with the Advanced Practitioner's impression and recommendations. Likely an extrinsic duodenal compression by progressive  Adenopathy. It is hard to tell is the obstruction is amenable to duodenal stent placement. We will proceed with EGD in am to assess severity and exact location of the obstruction, will also try to traverse the obstruction to determine the length of it, may place endoclips to mark the area.  [facility] Pager # [phone]

## 2013-11-20 NOTE — Progress Notes (Signed)
I have seen and examined the pt and agree with PA-Jenning's progress note. Pt to have EGD per GI May require stent vs. Bypass of GOO

## 2013-11-20 NOTE — Progress Notes (Signed)
Subjective: She is comfortable with NG in. Discussed with Dr. Charlies Silvers, and she will call GI Objective: Vital signs in last 24 hours: Temp:  [98.3 F (36.8 C)-99.1 F (37.3 C)] 98.9 F (37.2 C) (03/12 0654) Pulse Rate:  [96-110] 110 (03/12 0654) Resp:  [18] 18 (03/12 0654) BP: (133-161)/(71-79) 133/79 mmHg (03/12 0654) SpO2:  [95 %-96 %] 95 % (03/12 0654) Weight:  [52.2 kg (115 lb 1.3 oz)] 52.2 kg (115 lb 1.3 oz) (03/12 0704)  1600 from NG in ED, nothing on floor currently recorded. Afebrile, VSS Elevated LFT's  Intake/Output from previous day:   Intake/Output this shift:    General appearance: alert, cooperative and no distress  Lab Results:   Recent Labs  11/19/13 2035 11/20/13 0800  WBC 14.3* PENDING  10.4  HGB 11.2* PENDING  9.9*  HCT 33.9* PENDING  30.4*  PLT 323 PENDING  255    BMET  Recent Labs  11/19/13 2035 11/20/13 0800  NA 137 140  K 3.1* 3.0*  CL 94* 96  CO2 28 31  GLUCOSE 179* 130*  BUN 10 11  CREATININE 0.32* 0.35*  CALCIUM 10.0 9.7   PT/INR No results found for this basename: LABPROT, INR,  in the last 72 hours   Recent Labs Lab 11/14/13 1330 11/19/13 2035 11/20/13 0800  AST 28 45* 38*  ALT 17 25 22   ALKPHOS 218* 301* 265*  BILITOT 4.3* 4.7* 3.9*  PROT 8.6* 9.7* 8.7*  ALBUMIN 2.2* 2.5* 2.4*     Lipase     Component Value Date/Time   LIPASE 86* 11/19/2013 2035     Studies/Results: Ct Abdomen Pelvis W Contrast  11/20/2013   CLINICAL DATA Abdominal pain  EXAM CT ABDOMEN AND PELVIS WITH CONTRAST  TECHNIQUE Multidetector CT imaging of the abdomen and pelvis was performed using the standard protocol following bolus administration of intravenous contrast.  CONTRAST 20mL OMNIPAQUE IOHEXOL 300 MG/ML  SOLN  COMPARISON 08/12/2013 PET-CT, 03/11/2013 abdominal CT  FINDINGS Normal heart size. Lung bases predominantly clear. Partially imaged catheter tip near the cavoatrial junction. Coronary artery calcifications.  Left percutaneous  transhepatic biliary drainage catheter and an internalized biliary drain remain in place. Large hypovascular mass within the liver centrally measuring 5.8 x 4.5 cm. There is mild biliary ductal dilatation peripheral to the mass so considerably improved as compared to July 2014 though slightly increased from December. There are couple sub cm lesions within the left hepatic lobe, not confidently seen in July as index series 2, image 24 and 32.  No appreciable abnormality of spleen, pancreas, adrenal glands. Mild heterogeneous enhancement of the right kidney (series 7, image 10 as index). Symmetric excretion.  Decompressed colon limits evaluation. Partial colonic resection with suture material in the right upper quadrant. No overt colitis. The jejunal and ileal loops are decompressed. There is marked distention of the stomach and proximal duodenum with transition along the horizontal segment.  Aorta is of normal caliber with mild scattered atherosclerotic disease. There may be paraesophageal varices. Question gastrohepatic adenopathy up to 1.7 cm.  Thin walled bladder. Pelvic floor laxity. Prominent pelvic collateral vessels. Probable exophytic fibroid. No definite adnexal mass.  Unchanged osseous deformity of the pelvis and curvature of the spine.  IMPRESSION Marked gastric and proximal duodenal distention to the level of the horizontal segment. Correlate clinically if concerned for obstruction.  Central hepatic mass with mild residual intrahepatic biliary ductal dilatation, similar to mildly increased from December (though markedly improved from July).  A couple sub cm left  hepatic lobe lesions are suspicious for metastatic disease and not definitively seen on the prior. Prominent gastrohepatic lymph node is also suspicious for malignancy.  Mild heterogeneous enhancement of the right kidney on the delayed phase is nonspecific and may reflect sequelae of prior treatment/radiation. Ascending infection not excluded.  Correlate with urinalysis.  SIGNATURE  Electronically Signed   By: Carlos Levering M.D.   On: 11/20/2013 03:22    Medications: . enoxaparin (LOVENOX) injection  40 mg Subcutaneous Q24H  . levothyroxine  75 mcg Intravenous QAC breakfast  . metoprolol  5 mg Intravenous 4 times per day    Assessment/Plan Abdominal pain and nausea, now with gastric outlet obstruction. 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 Biliary obstruction with IR placed billiary drain left hepatic duct.03/13/13 Spina Bifida/scoliosis MVP Depression Hypertension   Plan:  GI evaluation and then decide what the next step is.  We will follow with you.      LOS: 1 day    April Spence 11/20/2013

## 2013-11-20 NOTE — Progress Notes (Signed)
INITIAL NUTRITION ASSESSMENT  Pt meets criteria for severe MALNUTRITION in the context of chronic illness as evidenced by severe muscle wasting and subcutaneous fat loss in clavicles and hands.  DOCUMENTATION CODES Per approved criteria  -Severe malnutrition in the context of chronic illness   INTERVENTION: - Diet advancement per MD - Will continue to monitor   NUTRITION DIAGNOSIS: Inadequate oral intake related to inability to eat as evidenced by NPO.   Goal: Advance diet as tolerated to regular diet  Monitor:  Weights, labs, diet advancement  Reason for Assessment: Malnutrition screening tool   58 y.o. female  Admitting Dx: Gastric outlet obstruction  ASSESSMENT: Pt with history of metastatic colon cancer s/p chemotherapy and radiation, biliary obstruction with stent placement, history of tachycardia on propranolol, scoliosis and bilateral foot drop, hypothyroidism started to experience nausea vomiting with abdominal discomfort since yesterday morning after a bowel movement. Since symptoms were persistent patient came to the ER and had a CT abdomen pelvis done which is concerning for gastric outlet obstruction. Presently patient is placed on NG tube suction and >2L of fluid has been drained.  Met with pt and husband who report pt has been having poor appetite for the past 2 months r/t recent dental surgery limiting intake. Pt has been consuming things like eggs, jello, yogurt, soup, and pudding and drinking at least 3 Boost/day. Was started on an appetite stimulant 2 weeks ago which she reports has been helping (Megace). Reports 40 pound unintended weight loss over the past couple of years and weight is down 4 pounds in the past month.   Potassium low, getting replaced in IVF Alk phos, AST, and total bilirubin elevated   Nutrition Focused Physical Exam:  Subcutaneous Fat:  Orbital Region: severe wasting Upper Arm Region: mild/moderate wasting Thoracic and Lumbar Region:  NA  Muscle:  Temple Region: mild/moderate wasting Clavicle Bone Region: severe wasting Clavicle and Acromion Bone Region: mild/moderate wasting Scapular Bone Region: NA Dorsal Hand: severe wasting Patellar Region: WNL Anterior Thigh Region: WNL Posterior Calf Region: WNL  Edema: pt reports some in her legs    Height: Ht Readings from Last 1 Encounters:  11/20/13 5' (1.524 m)    Weight: Wt Readings from Last 1 Encounters:  11/20/13 115 lb 1.3 oz (52.2 kg)    Ideal Body Weight: 100 lb  % Ideal Body Weight: 115%  Wt Readings from Last 10 Encounters:  11/20/13 115 lb 1.3 oz (52.2 kg)  10/28/13 118 lb (53.524 kg)  10/28/13 119 lb 9.6 oz (54.25 kg)  09/17/13 123 lb 14.4 oz (56.201 kg)  09/01/13 125 lb (56.7 kg)  08/14/13 125 lb 8 oz (56.926 kg)  08/06/13 125 lb 8 oz (56.926 kg)  07/25/13 126 lb 9.6 oz (57.425 kg)  07/21/13 127 lb (57.607 kg)  07/08/13 126 lb 11.2 oz (57.471 kg)    Usual Body Weight: 155 lb 2-3 years ago  % Usual Body Weight: 74%  BMI:  Body mass index is 22.48 kg/(m^2).  Estimated Nutritional Needs: Kcal: 1300-1500 Protein: 65-80g Fluid: 1.3-1.5L/day  Skin: Intact   Diet Order: NPO  EDUCATION NEEDS: -No education needs identified at this time   Intake/Output Summary (Last 24 hours) at 11/20/13 1648 Last data filed at 11/20/13 1400  Gross per 24 hour  Intake      0 ml  Output    500 ml  Net   -500 ml    Last BM: PTA  Labs:   Recent Labs Lab 11/14/13 1330 11/19/13 2035  11/20/13 0800  NA 134* 137 140  K 3.8 3.1* 3.0*  CL 98 94* 96  CO2 '24 28 31  ' BUN '12 10 11  ' CREATININE 0.28* 0.32* 0.35*  CALCIUM 9.7 10.0 9.7  GLUCOSE 129* 179* 130*    CBG (last 3)   Recent Labs  11/20/13 0755  GLUCAP 129*    Scheduled Meds: . enoxaparin (LOVENOX) injection  40 mg Subcutaneous Q24H  . levothyroxine  75 mcg Intravenous QAC breakfast  . metoprolol  5 mg Intravenous 4 times per day    Continuous Infusions: . 0.9 % NaCl with  KCl 20 mEq / L 100 mL/hr at 11/20/13 1108    Past Medical History  Diagnosis Date  . colon ca dx'd 04/2009    chemo comp 11/2009.. colon and liver  . Spina bifida   . Scoliosis   . Hyperlipidemia   . Foot drop     bilateral,wears braces  . Mitral valve prolapse   . Hypothyroidism   . Depression   . History of chemotherapy     5-FU and Avastin 12 cycles FOLFOX  . Colon cancer 05/06/2010    metastatic  . Hepatic metastases 08/15/12    per ct abdomen  . Radiation 10/21/12-10/28/12    Palliative liver mets 54 gray in 3 fx's  . Arrhythmia 02-26-13    "rapid heart rate occ."  . History of radiation therapy 10/21/2012-10/28/2012    54 gray to liver metastasis  . History of radiation therapy 04/07/2013-04/24/2013    45 gray to liver metastasis  . Hypertension 11/19/2013    Past Surgical History  Procedure Laterality Date  . Cholecystectomy    . Back surgery  1980 and 2010  . Knee arthroscopy  2005    left  . Tubal ligation  1973  . Colonoscopy  05/05/2010  . Colon surgery      2010  . Portacath placement  05/30/2011    tip at cavoatrialjjunction by xray  . Ercp N/A 02/27/2013    Procedure: ENDOSCOPIC RETROGRADE CHOLANGIOPANCREATOGRAPHY (ERCP);  Surgeon: Milus Banister, MD;  Location: [facility];  Service: Endoscopy;  Laterality: N/A;  . Biliary stent placement N/A 02/27/2013    Procedure: BILIARY STENT PLACEMENT;  Surgeon: Milus Banister, MD;  Location: [facility];  Service: Endoscopy;  Laterality: N/A;  . Percutaneous transhepatic cholangiograhpy and billiary drainage  03/21/2013  . Dental work  09/2013    [employer] [place], [place], [place] Pager [phone] After Hours Pager

## 2013-11-20 NOTE — Consult Note (Signed)
Reason for Consult: gastric outlet obstruction  April Spence is an 58 y.o. female.   HPI: patient is an unfortunate 58 year old female with a history of colon cancer dating back to 2010 summarized:  1. 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 She completed 12 cycles of FOLFOX chemotherapy. Oxaliplatin was held with cycles number 5, 8, 11, and 12. 2. Metastatic colon cancer confirmed on a restaging CT evaluation 05/02/2011 with liver metastases and periportal lymphadenopathy. She began treatment with FOLFIRI on 06/01/2011. Avastin was added beginning with cycle number 2. She completed cycle #6 08/08/2011. Restaging CT evaluation 08/15/2011 showed improvement in the liver metastases and porta hepatis lymphadenopathy. She completed cycle #12 FOLFIRI/Avastin 11/07/2011. Restaging CT evaluation 11/24/2011 showed decrease in size of hepatic metastases and no evidence of disease progression or new metastasis. She completed cycle 18 on 03/11/2012. A restaging CT 03/29/2012 revealed stable disease. Irinotecan was discontinued and treatment continued with 5-FU/leucovorin and Avastin on a 3 week schedule beginning 04/02/2012. -restaging CT 08/15/2012 with slight enlargement of the central liver lesion and progression of intrahepatic biliary dilatation, no other liver lesions and no other evidence of progressive metastatic disease. CEA stable on 08/20/2012. Treatment continued with 5-FU and Avastin on a 3 week schedule.restaging CT 09/30/2012 found the dominant central right liver metastasis to be slightly larger with progressive intrahepatic biliary dilatation, a previously described left hepatic lobe lesion appeared smaller measuring 10 mm and appeared separate from the liver and is felt to most likely represent a porta hepatis lymph node.A. Suspected 9 mm metastasis was noted in the medial left  hepatic lobe on an MRI 10/10/2012 -status post stereotactic radiosurgeryto the dominant liver metastasis in 3 fractions February 10 through 10/28/2012. 5-FU/Avastin was resumed on a 3 week schedule 12/03/2012. CEA in normal range on 12/03/2012 and 01/14/2013. Restaging MRI of the liver 02/21/2013 with a decrease in the dominant right hepatic lesion, stable medial left hepatic lobe lesion-but the left lesion is now causing obstruction of the left hepatic bile ducts.  She has not been on active treatment in recent months, being given a break-in trying to get some dental work done. She for a couple of months has had poor appetite and decrease in oral intake and weight loss. So this was attributed to her poor teeth. However the last several days she has developed increasing nausea and "dry heaves" without any high volume emesis. For 24-hour she developed increasing upper abdominal pain and pressure and increased nausea and presented to the emergency department. She's had no fever or chills. Some increased jaundice has been noted. She has had loose bowel movements which appear a little more light-colored. NG tube was just placed in the emergency department with return of 1600 cc of nonbilious fluid with marked relief of her symptoms.  Past Medical History  Diagnosis Date  . colon ca dx'd 04/2009    chemo comp 11/2009.. colon and liver  . Spina bifida   . Scoliosis   . Hyperlipidemia   . Foot drop     bilateral,wears braces  . Mitral valve prolapse   . Hypothyroidism   . Depression   . History of chemotherapy     5-FU and Avastin 12 cycles FOLFOX  . Colon cancer 05/06/2010    metastatic  . Hepatic metastases 08/15/12    per ct abdomen  . Radiation 10/21/12-10/28/12    Palliative  liver mets 52 gray in 3 fx's  . Arrhythmia 02-26-13    "rapid heart rate occ."  . History of radiation therapy 10/21/2012-10/28/2012    52 gray to liver metastasis  . History of radiation therapy 04/07/2013-04/24/2013    43  gray to liver metastasis  . Hypertension 11/19/2013    Past Surgical History  Procedure Laterality Date  . Cholecystectomy    . Back surgery  1980 and 2010  . Knee arthroscopy  2005    left  . Tubal ligation  1973  . Colonoscopy  05/05/2010  . Colon surgery      2010  . Portacath placement  05/30/2011    tip at cavoatrialjjunction by xray  . Ercp N/A 02/27/2013    Procedure: ENDOSCOPIC RETROGRADE CHOLANGIOPANCREATOGRAPHY (ERCP);  Surgeon: Milus Banister, MD;  Location: [facility];  Service: Endoscopy;  Laterality: N/A;  . Biliary stent placement N/A 02/27/2013    Procedure: BILIARY STENT PLACEMENT;  Surgeon: Milus Banister, MD;  Location: [facility];  Service: Endoscopy;  Laterality: N/A;  . Percutaneous transhepatic cholangiograhpy and billiary drainage  03/21/2013  . Dental work  09/2013    Family History  Problem Relation Age of Onset  . Cancer Sister     breast  . Cancer Maternal Grandmother     breast  . Cancer Other 12    breast ca    Social History:  reports that she quit smoking about 7 years ago. She has never used smokeless tobacco. She reports that she does not drink alcohol or use illicit drugs.  Allergies:  Allergies  Allergen Reactions  . Adhesive [Tape]     Pt is fine with paper tape  . Codeine Nausea And Vomiting    Pt denies this   Current Facility-Administered Medications  Medication Dose Route Frequency Provider Last Rate Last Dose  . promethazine (PHENERGAN) injection 6.25 mg  6.25 mg Intravenous Q4H PRN Varney Biles, MD   6.25 mg at 11/20/13 0134   Current Outpatient Prescriptions  Medication Sig Dispense Refill  . ALPRAZolam (XANAX) 0.25 MG tablet Take 0.25 mg by mouth 2 (two) times daily as needed for sleep or anxiety.      . Alum & Mag Hydroxide-Simeth (MAGIC MOUTHWASH) SOLN Take 10 mLs by mouth 4 (four) times daily as needed for mouth pain. Mouthsores      . amitriptyline (ELAVIL) 25 MG tablet Take 50-75 mg by mouth every evening. As  needed for chronic back pain      . chlorhexidine (PERIDEX) 0.12 % solution Use as directed 15 mLs in the mouth or throat 2 (two) times daily.       . Cholecalciferol (VITAMIN D-3) 5000 UNITS TABS Take 5,000 Units by mouth 2 (two) times daily.       . diphenoxylate-atropine (LOMOTIL) 2.5-0.025 MG per tablet Take 1 tablet by mouth 4 (four) times daily as needed for diarrhea or loose stools.      . docusate sodium (COLACE) 100 MG capsule Take 100 mg by mouth 2 (two) times daily.       Marland Kitchen FLUoxetine (PROZAC) 20 MG capsule Take 20 mg by mouth every morning.       Marland Kitchen levothyroxine (SYNTHROID, LEVOTHROID) 150 MCG tablet Take 150 mcg by mouth daily before breakfast.      . lidocaine-prilocaine (EMLA) cream Apply 1 application topically as needed (use on port).      . LORazepam (ATIVAN) 0.5 MG tablet Take 1 tablet (0.5 mg total) by mouth every  12 (twelve) hours.  60 tablet  0  . megestrol (MEGACE ORAL) 40 MG/ML suspension Take 5 mLs (200 mg total) by mouth 2 (two) times daily.  240 mL  2  . morphine (MS CONTIN) 30 MG 12 hr tablet Take 1 tablet (30 mg total) by mouth every 12 (twelve) hours.  60 tablet  0  . ondansetron (ZOFRAN) 8 MG tablet Take 8 mg by mouth every 12 (twelve) hours as needed for nausea or vomiting.      . prochlorperazine (COMPAZINE) 10 MG tablet Take 10 mg by mouth every 6 (six) hours as needed (nausea).       . propranolol (INDERAL) 80 MG tablet Take 80 mg by mouth every morning.       . nystatin (NYSTOP) 100000 UNIT/GM POWD Apply 1 g topically daily as needed (for rash). In summer on irritated areas      . oxyCODONE-acetaminophen (PERCOCET/ROXICET) 5-325 MG per tablet Take 2 tablets by mouth every 4 (four) hours as needed for moderate pain or severe pain.  30 tablet  0  . traMADol-acetaminophen (ULTRACET) 37.5-325 MG per tablet Take 1 tablet by mouth every 6 (six) hours as needed for moderate pain.          Results for orders placed during the hospital encounter of 11/19/13 (from the  past 48 hour(s))  CBC WITH DIFFERENTIAL     Status: Abnormal   Collection Time    11/19/13  8:35 PM      Result Value Ref Range   WBC 14.3 (*) 4.0 - 10.5 K/uL   RBC 3.73 (*) 3.87 - 5.11 MIL/uL   Hemoglobin 11.2 (*) 12.0 - 15.0 g/dL   HCT 33.9 (*) 36.0 - 46.0 %   MCV 90.9  78.0 - 100.0 fL   MCH 30.0  26.0 - 34.0 pg   MCHC 33.0  30.0 - 36.0 g/dL   RDW 17.1 (*) 11.5 - 15.5 %   Platelets 323  150 - 400 K/uL   Neutrophils Relative % 84 (*) 43 - 77 %   Neutro Abs 11.9 (*) 1.7 - 7.7 K/uL   Lymphocytes Relative 7 (*) 12 - 46 %   Lymphs Abs 0.9  0.7 - 4.0 K/uL   Monocytes Relative 8  3 - 12 %   Monocytes Absolute 1.2 (*) 0.1 - 1.0 K/uL   Eosinophils Relative 1  0 - 5 %   Eosinophils Absolute 0.2  0.0 - 0.7 K/uL   Basophils Relative 0  0 - 1 %   Basophils Absolute 0.0  0.0 - 0.1 K/uL  COMPREHENSIVE METABOLIC PANEL     Status: Abnormal   Collection Time    11/19/13  8:35 PM      Result Value Ref Range   Sodium 137  137 - 147 mEq/L   Potassium 3.1 (*) 3.7 - 5.3 mEq/L   Chloride 94 (*) 96 - 112 mEq/L   CO2 28  19 - 32 mEq/L   Glucose, Bld 179 (*) 70 - 99 mg/dL   BUN 10  6 - 23 mg/dL   Creatinine, Ser 0.32 (*) 0.50 - 1.10 mg/dL   Calcium 10.0  8.4 - 10.5 mg/dL   Total Protein 9.7 (*) 6.0 - 8.3 g/dL   Albumin 2.5 (*) 3.5 - 5.2 g/dL   AST 45 (*) 0 - 37 U/L   ALT 25  0 - 35 U/L   Alkaline Phosphatase 301 (*) 39 - 117 U/L   Total Bilirubin 4.7 (*)  0.3 - 1.2 mg/dL   GFR calc non Af Amer >90  >90 mL/min   GFR calc Af Amer >90  >90 mL/min   Comment: (NOTE)     The eGFR has been calculated using the CKD EPI equation.     This calculation has not been validated in all clinical situations.     eGFR's persistently <90 mL/min signify possible Chronic Kidney     Disease.  LIPASE, BLOOD     Status: Abnormal   Collection Time    11/19/13  8:35 PM      Result Value Ref Range   Lipase 86 (*) 11 - 59 U/L  I-STAT TROPOININ, ED     Status: None   Collection Time    11/19/13  8:41 PM       Result Value Ref Range   Troponin i, poc 0.00  0.00 - 0.08 ng/mL   Comment 3            Comment: Due to the release kinetics of cTnI,     a negative result within the first hours     of the onset of symptoms does not rule out     myocardial infarction with certainty.     If myocardial infarction is still suspected,     repeat the test at appropriate intervals.  URINALYSIS, ROUTINE W REFLEX MICROSCOPIC     Status: Abnormal   Collection Time    11/19/13  9:19 PM      Result Value Ref Range   Color, Urine AMBER (*) YELLOW   Comment: BIOCHEMICALS MAY BE AFFECTED BY COLOR   APPearance CLOUDY (*) CLEAR   Specific Gravity, Urine 1.018  1.005 - 1.030   pH 8.0  5.0 - 8.0   Glucose, UA NEGATIVE  NEGATIVE mg/dL   Hgb urine dipstick NEGATIVE  NEGATIVE   Bilirubin Urine SMALL (*) NEGATIVE   Ketones, ur NEGATIVE  NEGATIVE mg/dL   Protein, ur 30 (*) NEGATIVE mg/dL   Urobilinogen, UA 1.0  0.0 - 1.0 mg/dL   Nitrite NEGATIVE  NEGATIVE   Leukocytes, UA SMALL (*) NEGATIVE  URINE MICROSCOPIC-ADD ON     Status: Abnormal   Collection Time    11/19/13  9:19 PM      Result Value Ref Range   Squamous Epithelial / LPF FEW (*) RARE   WBC, UA 3-6  <3 WBC/hpf   RBC / HPF 0-2  <3 RBC/hpf   Urine-Other FEW YEAST      Ct Abdomen Pelvis W Contrast  11/20/2013   CLINICAL DATA Abdominal pain  EXAM CT ABDOMEN AND PELVIS WITH CONTRAST  TECHNIQUE Multidetector CT imaging of the abdomen and pelvis was performed using the standard protocol following bolus administration of intravenous contrast.  CONTRAST 41m OMNIPAQUE IOHEXOL 300 MG/ML  SOLN  COMPARISON 08/12/2013 PET-CT, 03/11/2013 abdominal CT  FINDINGS Normal heart size. Lung bases predominantly clear. Partially imaged catheter tip near the cavoatrial junction. Coronary artery calcifications.  Left percutaneous transhepatic biliary drainage catheter and an internalized biliary drain remain in place. Large hypovascular mass within the liver centrally measuring 5.8 x  4.5 cm. There is mild biliary ductal dilatation peripheral to the mass so considerably improved as compared to July 2014 though slightly increased from December. There are couple sub cm lesions within the left hepatic lobe, not confidently seen in July as index series 2, image 24 and 32.  No appreciable abnormality of spleen, pancreas, adrenal glands. Mild heterogeneous enhancement of the right kidney (series  7, image 10 as index). Symmetric excretion.  Decompressed colon limits evaluation. Partial colonic resection with suture material in the right upper quadrant. No overt colitis. The jejunal and ileal loops are decompressed. There is marked distention of the stomach and proximal duodenum with transition along the horizontal segment.  Aorta is of normal caliber with mild scattered atherosclerotic disease. There may be paraesophageal varices. Question gastrohepatic adenopathy up to 1.7 cm.  Thin walled bladder. Pelvic floor laxity. Prominent pelvic collateral vessels. Probable exophytic fibroid. No definite adnexal mass.  Unchanged osseous deformity of the pelvis and curvature of the spine.  IMPRESSION Marked gastric and proximal duodenal distention to the level of the horizontal segment. Correlate clinically if concerned for obstruction.  Central hepatic mass with mild residual intrahepatic biliary ductal dilatation, similar to mildly increased from December (though markedly improved from July).  A couple sub cm left hepatic lobe lesions are suspicious for metastatic disease and not definitively seen on the prior. Prominent gastrohepatic lymph node is also suspicious for malignancy.  Mild heterogeneous enhancement of the right kidney on the delayed phase is nonspecific and may reflect sequelae of prior treatment/radiation. Ascending infection not excluded. Correlate with urinalysis.  SIGNATURE  Electronically Signed   By: Carlos Levering M.D.   On: 11/20/2013 03:22    Review of Systems  Constitutional:  Positive for weight loss. Negative for fever and chills.  Respiratory: Negative.   Cardiovascular: Negative.   Gastrointestinal: Positive for nausea, vomiting and abdominal pain.  Musculoskeletal: Positive for joint pain.  Neurological: Positive for focal weakness.   Blood pressure 157/78, pulse 96, temperature 98.3 F (36.8 C), temperature source Oral, resp. rate 18, SpO2 96.00%. Physical Exam General: Alert Caucasian female, in no distress Skin: Warm and dry without rash or infection. HEENT: No palpable masses or thyromegaly. Sclera moderately icteric. Pupils equal round and reactive. Oropharynx clear. Poor dentition Lymph nodes: No cervical, supraclavicular, or inguinal nodes palpable. Lungs: Breath sounds clear and equal without increased work of breathing Cardiovascular: Regular rate and rhythm without murmur. No JVD. Trace lower extremity edema Abdomen: Nondistended. Mild epigastric tenderness without guarding. Biliary catheter in place and upper abdomen with slight bile drainage around the catheter. Extremities: 1+ lower extremity edema. Marked spinal deformity. Swelling and deformity of the left knee. Minimal mobility of the left knee Neurologic: Alert and fully oriented. Bilateral complete foot drop.  Assessment/Plan: Patient with metastatic cancer of the colon as above no hepatic metastasis status post chemotherapy and radiofrequency ablation and known periportal adenopathy. She now presents with evidence of gastric outlet obstruction. No definite mass causing obstruction seen on CT scan although very likely this represents malignant gastric outlet obstruction from metastatic colon cancer. Gastric peristalsis seems much less likely. She currently is feeling significantly better with NG tube drainage. She is being admitted by the hospitalist service. Would recommend initially upper endoscopy for diagnosis. If she indeed has malignant duodenal obstruction stenting would be the least  invasive option but may not be technically possible. She could be a candidate for palliative gastrojejunostomy although would be at somewhat high surgical risk due to her metastatic cancer and deconditioning and nutritional state.  Gerell Fortson T 11/20/2013, 6:37 AM

## 2013-11-20 NOTE — ED Notes (Signed)
#  93F NG tube placed R nare using viscous lidocaine without difficulty. 1646ml Brown output noted, pt immediately states she feels better. MD made aware.

## 2013-11-20 NOTE — Progress Notes (Signed)
Verbal order from MD Hoxworth to flush patient's biliary drain with 10 ml of normal saline daily Neta Mends RN 11-20-2013 7:46am

## 2013-11-20 NOTE — Progress Notes (Addendum)
[facility] PROGRESS NOTE  BETHAN ADAMEK GEX:[id number] DOB: 26-Aug-1956 DOA: 11/19/2013 PCP: Alesia Richards, MD  Brief narrative: 58 -year-old female with past medical history significant for metastatic colon cancer, biliary obstruction and stent placement who presented to [facility] ED 11/19/2013 with persistent nausea, vomiting and abdominal discomfort for past 24 hours prior to this admission. In ED, vital signs are stable. CT abdomen/pelvis revealed possible gastric outlet obstruction  Assessment/Plan:  Principal Problem:   Abdominal pain, nausea and vomiting in the setting of possible gastric outlet obstruction - Possible obstruction in gastric and proximal duodenal area  - Appreciate GI consult and recommendations. Surgery is following  - Continue n.p.o., NG tube - Continue supportive care with IV fluids, antiemetics. Pain management with Dilaudid 1 mg IV every 3 hours as needed for severe pain Active Problems:   Hypothyroidism - Continue levothyroxine 75 mcg IV daily   History of tachycardia - On by mouth propranolol at home. We'll switch to metoprolol 5 mg IV every 6 hours   Severe protein calorie malnutrition - Secondary to history of progressive malignancy - Now n.p.o. due to gastric outlet   Hypokalemia - Secondary to GI losses - Being repleted in IV fluids   Anemia of chronic disease - Secondary to history of malignancy - Hemoglobin 11.2 on the admission - No current indications for transfusion   Leukocytosis - White blood cell count 14.3 on admission and it has resolved to normal on subsequent blood work  Code Status: Full code Family Communication: Husband at the bedside Disposition Plan: Home when stable  Leisa Lenz, MD  [facility] Pager [phone]  If 7PM-7AM, please contact night-coverage [email or web address] Password [email or web address] 11/20/2013, 11:19 AM   LOS: 1 day    Consultants:  Surgery  Gastroenterology  Procedures:  None  Antibiotics:  None  HPI/Subjective: Says she feels better this morning.  Objective: Filed Vitals:   11/20/13 0446 11/20/13 0654 11/20/13 0704 11/20/13 1000  BP: 157/78 133/79  107/62  Pulse: 96 110  112  Temp: 98.3 F (36.8 C) 98.9 F (37.2 C)  98.3 F (36.8 C)  TempSrc: Oral Oral  Oral  Resp: 18 18  18   Height:   5' (1.524 m)   Weight:   52.2 kg (115 lb 1.3 oz)   SpO2: 96% 95%  93%    Intake/Output Summary (Last 24 hours) at 11/20/13 1119 Last data filed at 11/20/13 0601  Gross per 24 hour  Intake      0 ml  Output      0 ml  Net      0 ml    Exam:   General:  Pt is alert, follows commands appropriately, not in acute distress  Cardiovascular: Regular rate and rhythm, S1/S2 appreciated  Respiratory: Clear to auscultation bilaterally, no wheezing, no crackles, no rhonchi  Abdomen: Soft, non tender, non distended, bowel sounds present, no guarding; NG tube in place  Extremities: No edema, pulses DP and PT palpable bilaterally  Neuro: Grossly nonfocal  Data Reviewed: Basic Metabolic Panel:  Recent Labs Lab 11/14/13 1330 11/19/13 2035 11/20/13 0800  NA 134* 137 140  K 3.8 3.1* 3.0*  CL 98 94* 96  CO2 24 28 31   GLUCOSE 129* 179* 130*  BUN 12 10 11   CREATININE 0.28* 0.32* 0.35*  CALCIUM 9.7 10.0 9.7   Liver Function Tests:  Recent Labs Lab 11/14/13 1330 11/19/13 2035 11/20/13 0800  AST 28 45* 38*  ALT 17 25 22   ALKPHOS 218* 301* 265*  BILITOT 4.3* 4.7* 3.9*  PROT 8.6* 9.7* 8.7*  ALBUMIN 2.2* 2.5* 2.4*    Recent Labs Lab 11/19/13 2035  LIPASE 86*   No results found for this basename: AMMONIA,  in the last 168 hours CBC:  Recent Labs Lab 11/14/13 1330 11/19/13 2035 11/20/13 0800  WBC 9.9 14.3* PENDING  10.4  NEUTROABS  --  51.0* DUPLICATE SEE ACCESSION [id number]  8.7*  HGB 9.5* 11.2* PENDING  9.9*  HCT 29.6* 33.9* PENDING  30.4*  MCV 92.2 90.9 PENDING   91.0  PLT 184 323 PENDING  255   Cardiac Enzymes: No results found for this basename: CKTOTAL, CKMB, CKMBINDEX, TROPONINI,  in the last 168 hours BNP: No components found with this basename: POCBNP,  CBG:  Recent Labs Lab 11/20/13 0755  GLUCAP 129*    Studies: Ct Abdomen Pelvis W Contrast 11/20/2013    IMPRESSION Marked gastric and proximal duodenal distention to the level of the horizontal segment. Correlate clinically if concerned for obstruction.  Central hepatic mass with mild residual intrahepatic biliary ductal dilatation, similar to mildly increased from December (though markedly improved from July).  A couple sub cm left hepatic lobe lesions are suspicious for metastatic disease and not definitively seen on the prior. Prominent gastrohepatic lymph node is also suspicious for malignancy.  Mild heterogeneous enhancement of the right kidney on the delayed phase is nonspecific and may reflect sequelae of prior treatment/radiation. Ascending infection not excluded. Correlate with urinalysis.     Scheduled Meds: . enoxaparin (LOVENOX)   40 mg Subcutaneous Q24H  . levothyroxine  75 mcg Intravenous QAC breakfast  . metoprolol  5 mg Intravenous 4 times per day   Continuous Infusions: . 0.9 % NaCl with KCl 20 mEq / L 100 mL/hr at 11/20/13 1108

## 2013-11-20 NOTE — H&P (Signed)
[facility] History and Physical  CHRISTASIA ANGELETTI URK:[id number] DOB: [birth year] DOA: 11/19/2013  Referring physician: ER physician. PCP: Alesia Richards, MD   Chief Complaint: Abdominal pain with nausea vomiting.  HPI: April Spence is a 58 y.o. female with history of metastatic colon cancer, biliary obstruction with stent placement, history of tachycardia on propranolol, scoliosis and bilateral foot drop, hypothyroidism started to experience nausea vomiting with abdominal discomfort since yesterday morning after a bowel movement. Since symptoms were persistent patient came to the ER and had a CT abdomen pelvis done which is concerning for gastric outlet obstruction. On-call surgeon Dr. Excell Seltzer was consulted. They will be seeing patient in consult. Presently patient is placed on NG tube suction and 1600 cc of fluid has been drained. Patient otherwise denies any chest pain or shortness of breath.   Review of Systems: As presented in the history of presenting illness, rest negative.  Past Medical History  Diagnosis Date  . colon ca dx'd 04/2009    chemo comp 11/2009.. colon and liver  . Spina bifida   . Scoliosis   . Hyperlipidemia   . Foot drop     bilateral,wears braces  . Mitral valve prolapse   . Hypothyroidism   . Depression   . History of chemotherapy     5-FU and Avastin 12 cycles FOLFOX  . Colon cancer 05/06/2010    metastatic  . Hepatic metastases 08/15/12    per ct abdomen  . Radiation 10/21/12-10/28/12    Palliative liver mets 54 gray in 3 fx's  . Arrhythmia 02-26-13    "rapid heart rate occ."  . History of radiation therapy 10/21/2012-10/28/2012    54 gray to liver metastasis  . History of radiation therapy 04/07/2013-04/24/2013    45 gray to liver metastasis  . Hypertension 11/19/2013   Past Surgical History  Procedure Laterality Date  . Cholecystectomy    . Back surgery  1980 and 2010  . Knee arthroscopy  2005    left  . Tubal ligation  1973  .  Colonoscopy  05/05/2010  . Colon surgery      2010  . Portacath placement  05/30/2011    tip at cavoatrialjjunction by xray  . Ercp N/A 02/27/2013    Procedure: ENDOSCOPIC RETROGRADE CHOLANGIOPANCREATOGRAPHY (ERCP);  Surgeon: Milus Banister, MD;  Location: [facility];  Service: Endoscopy;  Laterality: N/A;  . Biliary stent placement N/A 02/27/2013    Procedure: BILIARY STENT PLACEMENT;  Surgeon: Milus Banister, MD;  Location: [facility];  Service: Endoscopy;  Laterality: N/A;  . Percutaneous transhepatic cholangiograhpy and billiary drainage  03/21/2013  . Dental work  09/2013   Social History:  reports that she quit smoking about 7 years ago. She has never used smokeless tobacco. She reports that she does not drink alcohol or use illicit drugs. Where does patient live home. Can patient participate in ADLs? Not sure.  Allergies  Allergen Reactions  . Adhesive [Tape]     Pt is fine with paper tape  . Codeine Nausea And Vomiting    Pt denies this    Family History:  Family History  Problem Relation Age of Onset  . Cancer Sister     breast  . Cancer Maternal Grandmother     breast  . Cancer Other 70    breast ca      Prior to Admission medications   Medication Sig Start Date End Date Taking? Authorizing Provider  ALPRAZolam Duanne Moron) 0.25 MG tablet Take 0.25 mg  by mouth 2 (two) times daily as needed for sleep or anxiety.   Yes Historical Provider, MD  Alum & Mag Hydroxide-Simeth (MAGIC MOUTHWASH) SOLN Take 10 mLs by mouth 4 (four) times daily as needed for mouth pain. Mouthsores   Yes Historical Provider, MD  amitriptyline (ELAVIL) 25 MG tablet Take 50-75 mg by mouth every evening. As needed for chronic back pain   Yes Historical Provider, MD  chlorhexidine (PERIDEX) 0.12 % solution Use as directed 15 mLs in the mouth or throat 2 (two) times daily.  02/20/13  Yes Historical Provider, MD  Cholecalciferol (VITAMIN D-3) 5000 UNITS TABS Take 5,000 Units by mouth 2 (two) times daily.     Yes Historical Provider, MD  diphenoxylate-atropine (LOMOTIL) 2.5-0.025 MG per tablet Take 1 tablet by mouth 4 (four) times daily as needed for diarrhea or loose stools. 04/18/13  Yes Ladell Pier, MD  docusate sodium (COLACE) 100 MG capsule Take 100 mg by mouth 2 (two) times daily.    Yes Historical Provider, MD  FLUoxetine (PROZAC) 20 MG capsule Take 20 mg by mouth every morning.  03/30/11  Yes Historical Provider, MD  levothyroxine (SYNTHROID, LEVOTHROID) 150 MCG tablet Take 150 mcg by mouth daily before breakfast.   Yes Historical Provider, MD  lidocaine-prilocaine (EMLA) cream Apply 1 application topically as needed (use on port).   Yes Historical Provider, MD  LORazepam (ATIVAN) 0.5 MG tablet Take 1 tablet (0.5 mg total) by mouth every 12 (twelve) hours. 11/10/13  Yes Melissa R Mccombie, PA-C  megestrol (MEGACE ORAL) 40 MG/ML suspension Take 5 mLs (200 mg total) by mouth 2 (two) times daily. 10/28/13  Yes Owens Shark, NP  morphine (MS CONTIN) 30 MG 12 hr tablet Take 1 tablet (30 mg total) by mouth every 12 (twelve) hours. 11/10/13  Yes Ladell Pier, MD  ondansetron (ZOFRAN) 8 MG tablet Take 8 mg by mouth every 12 (twelve) hours as needed for nausea or vomiting.   Yes Historical Provider, MD  prochlorperazine (COMPAZINE) 10 MG tablet Take 10 mg by mouth every 6 (six) hours as needed (nausea).  07/12/11  Yes Ladell Pier, MD  propranolol (INDERAL) 80 MG tablet Take 80 mg by mouth every morning.  04/18/11  Yes Historical Provider, MD  nystatin (NYSTOP) 100000 UNIT/GM POWD Apply 1 g topically daily as needed (for rash). In summer on irritated areas 07/11/11   Historical Provider, MD  oxyCODONE-acetaminophen (PERCOCET/ROXICET) 5-325 MG per tablet Take 2 tablets by mouth every 4 (four) hours as needed for moderate pain or severe pain. 11/12/13   Ladell Pier, MD  traMADol-acetaminophen (ULTRACET) 37.5-325 MG per tablet Take 1 tablet by mouth every 6 (six) hours as needed for moderate pain.  10/17/13    Historical Provider, MD    Physical Exam: Filed Vitals:   11/19/13 2009 11/20/13 0155 11/20/13 0446 11/20/13 0654  BP: 161/74 147/71 157/78 133/79  Pulse: 98 97 96 110  Temp: 99.1 F (37.3 C)  98.3 F (36.8 C) 98.9 F (37.2 C)  TempSrc: Oral  Oral Oral  Resp: 18 18 18 18   SpO2: 96% 96% 96% 95%     General:  Well-developed and moderately nourished.  Eyes: Anicteric no pallor.  ENT: No discharge from the ears eyes nose mouth.  Neck: No mass felt.  Cardiovascular: S1-S2 heard.  Respiratory: No rhonchi or crepitations.  Abdomen: Distended. Bowel sounds are present. There is a drain on the left upper quadrant.  Skin: Patient has history of decubitus ulcer  on the left buttock and at this time it is difficult to assess.  Musculoskeletal: History of scoliosis.  Psychiatric: Appears normal.  Neurologic: Alert awake oriented to time place and person.  Labs on Admission:  Basic Metabolic Panel:  Recent Labs Lab 11/14/13 1330 11/19/13 2035  NA 134* 137  K 3.8 3.1*  CL 98 94*  CO2 24 28  GLUCOSE 129* 179*  BUN 12 10  CREATININE 0.28* 0.32*  CALCIUM 9.7 10.0   Liver Function Tests:  Recent Labs Lab 11/14/13 1330 11/19/13 2035  AST 28 45*  ALT 17 25  ALKPHOS 218* 301*  BILITOT 4.3* 4.7*  PROT 8.6* 9.7*  ALBUMIN 2.2* 2.5*    Recent Labs Lab 11/19/13 2035  LIPASE 86*   No results found for this basename: AMMONIA,  in the last 168 hours CBC:  Recent Labs Lab 11/14/13 1330 11/19/13 2035  WBC 9.9 14.3*  NEUTROABS  --  11.9*  HGB 9.5* 11.2*  HCT 29.6* 33.9*  MCV 92.2 90.9  PLT 184 323   Cardiac Enzymes: No results found for this basename: CKTOTAL, CKMB, CKMBINDEX, TROPONINI,  in the last 168 hours  BNP (last 3 results) No results found for this basename: PROBNP,  in the last 8760 hours CBG: No results found for this basename: GLUCAP,  in the last 168 hours  Radiological Exams on Admission: Ct Abdomen Pelvis W Contrast  11/20/2013    CLINICAL DATA Abdominal pain  EXAM CT ABDOMEN AND PELVIS WITH CONTRAST  TECHNIQUE Multidetector CT imaging of the abdomen and pelvis was performed using the standard protocol following bolus administration of intravenous contrast.  CONTRAST 24mL OMNIPAQUE IOHEXOL 300 MG/ML  SOLN  COMPARISON 08/12/2013 PET-CT, 03/11/2013 abdominal CT  FINDINGS Normal heart size. Lung bases predominantly clear. Partially imaged catheter tip near the cavoatrial junction. Coronary artery calcifications.  Left percutaneous transhepatic biliary drainage catheter and an internalized biliary drain remain in place. Large hypovascular mass within the liver centrally measuring 5.8 x 4.5 cm. There is mild biliary ductal dilatation peripheral to the mass so considerably improved as compared to July 2014 though slightly increased from December. There are couple sub cm lesions within the left hepatic lobe, not confidently seen in July as index series 2, image 24 and 32.  No appreciable abnormality of spleen, pancreas, adrenal glands. Mild heterogeneous enhancement of the right kidney (series 7, image 10 as index). Symmetric excretion.  Decompressed colon limits evaluation. Partial colonic resection with suture material in the right upper quadrant. No overt colitis. The jejunal and ileal loops are decompressed. There is marked distention of the stomach and proximal duodenum with transition along the horizontal segment.  Aorta is of normal caliber with mild scattered atherosclerotic disease. There may be paraesophageal varices. Question gastrohepatic adenopathy up to 1.7 cm.  Thin walled bladder. Pelvic floor laxity. Prominent pelvic collateral vessels. Probable exophytic fibroid. No definite adnexal mass.  Unchanged osseous deformity of the pelvis and curvature of the spine.  IMPRESSION Marked gastric and proximal duodenal distention to the level of the horizontal segment. Correlate clinically if concerned for obstruction.  Central hepatic mass  with mild residual intrahepatic biliary ductal dilatation, similar to mildly increased from December (though markedly improved from July).  A couple sub cm left hepatic lobe lesions are suspicious for metastatic disease and not definitively seen on the prior. Prominent gastrohepatic lymph node is also suspicious for malignancy.  Mild heterogeneous enhancement of the right kidney on the delayed phase is nonspecific and may reflect sequelae  of prior treatment/radiation. Ascending infection not excluded. Correlate with urinalysis.  SIGNATURE  Electronically Signed   By: Carlos Levering M.D.   On: 11/20/2013 03:22     Assessment/Plan Principal Problem:   Gastric outlet obstruction Active Problems:   Hypothyroidism   PreDiabetes   Colon cancer metastasized to liver   Anemia   1. Possible gastric outlet obstruction with history of metastatic colon cancer - at this time patient has been placed on NG tube suction. Patient is placed n.p.o. with gentle hydration. Surgery to see and further recommendations per surgery. 2. Metastatic colon cancer with biliary obstruction - per oncologist. 3. Hypothyroidism - I have placed patient on IV Synthroid equivalent. 4. History of tachycardia on propranolol - since patient is n.p.o. I have placed patient on scheduled dose of metoprolol IV 6 mg every 6 hourly with holding parameters. 5. History of prediabetes - closely follow CBGs. 6. Anemia - follow CBC closely. 7. History of scoliosis and foot drop. 8. History of left buttock ulcer - at this time in the ER I was not able to assess patient's skin condition in her back due to the patient's present condition. Please assess in a.m.   Code Status: Full code.  Family Communication: Patient's husband.  Disposition Plan: Admit to inpatient.    Makani Seckman N. [facility] Pager [phone].  If 7PM-7AM, please contact night-coverage [email or web address] Password [facility] 11/20/2013, 6:57 AM

## 2013-11-20 NOTE — ED Notes (Signed)
Dr. Sherrin Daisy at bedside.

## 2013-11-21 ENCOUNTER — Encounter (HOSPITAL_COMMUNITY)
Admission: EM | Disposition: A | Discharge status: Home / Self Care | Payer: Self-pay | Source: Home / Self Care | Attending: Internal Medicine

## 2013-11-21 ENCOUNTER — Encounter (HOSPITAL_COMMUNITY): Payer: Self-pay | Admitting: *Deleted

## 2013-11-21 DIAGNOSIS — C787 Secondary malignant neoplasm of liver and intrahepatic bile duct: Secondary | ICD-10-CM

## 2013-11-21 DIAGNOSIS — C189 Malignant neoplasm of colon, unspecified: Secondary | ICD-10-CM

## 2013-11-21 DIAGNOSIS — R112 Nausea with vomiting, unspecified: Secondary | ICD-10-CM

## 2013-11-21 HISTORY — PX: ESOPHAGOGASTRODUODENOSCOPY: SHX5428

## 2013-11-21 LAB — BASIC METABOLIC PANEL
BUN: 13 mg/dL (ref 6–23)
CHLORIDE: 105 meq/L (ref 96–112)
CO2: 34 meq/L — AB (ref 19–32)
Calcium: 9.5 mg/dL (ref 8.4–10.5)
Creatinine, Ser: 0.44 mg/dL — ABNORMAL LOW (ref 0.50–1.10)
GFR calc Af Amer: >90 mL/min (ref >90)
GFR calc non Af Amer: >90 mL/min (ref >90)
Glucose, Bld: 128 mg/dL — ABNORMAL HIGH (ref 70–99)
Potassium: 3.2 mEq/L — ABNORMAL LOW (ref 3.7–5.3)
Sodium: 152 mEq/L — ABNORMAL HIGH (ref 137–147)

## 2013-11-21 LAB — GLUCOSE, CAPILLARY
GLUCOSE-CAPILLARY: 133 mg/dL — AB (ref 70–99)
GLUCOSE-CAPILLARY: 124 mg/dL — AB (ref 70–99)
GLUCOSE-CAPILLARY: 130 mg/dL — AB (ref 70–99)
GLUCOSE-CAPILLARY: 117 mg/dL — AB (ref 70–99)
Glucose-Capillary: 137 mg/dL — ABNORMAL HIGH (ref 70–99)

## 2013-11-21 LAB — CBC
HEMATOCRIT: 30.6 % — AB (ref 36.0–46.0)
HEMOGLOBIN: 9.7 g/dL — AB (ref 12.0–15.0)
MCH: 29.9 pg (ref 26.0–34.0)
MCHC: 31.7 g/dL (ref 30.0–36.0)
MCV: 94.4 fL (ref 78.0–100.0)
Platelets: 258 10*3/uL (ref 150–400)
RBC: 3.24 MIL/uL — ABNORMAL LOW (ref 3.87–5.11)
RDW: 17.7 % — AB (ref 11.5–15.5)
WBC: 10.2 10*3/uL (ref 4.0–10.5)

## 2013-11-21 LAB — PREALBUMIN: Prealbumin: 6.5 mg/dL — ABNORMAL LOW (ref 17.0–34.0)

## 2013-11-21 SURGERY — EGD (ESOPHAGOGASTRODUODENOSCOPY)
Anesthesia: Moderate Sedation

## 2013-11-21 MED ORDER — FENTANYL CITRATE 0.05 MG/ML IJ SOLN
INTRAMUSCULAR | Status: AC
  Filled 2013-11-21: qty 2

## 2013-11-21 MED ORDER — POTASSIUM CHLORIDE 10 MEQ/100ML IV SOLN
10.0000 meq | INTRAVENOUS | Status: AC
  Administered 2013-11-21 (×3): 10 meq via INTRAVENOUS
  Filled 2013-11-21 (×3): qty 100

## 2013-11-21 MED ORDER — MIDAZOLAM HCL 10 MG/2ML IJ SOLN
INTRAMUSCULAR | Status: AC
  Filled 2013-11-21: qty 2

## 2013-11-21 MED ORDER — FENTANYL CITRATE 0.05 MG/ML IJ SOLN
INTRAMUSCULAR | Status: DC | PRN
  Administered 2013-11-21 (×2): 25 ug via INTRAVENOUS

## 2013-11-21 MED ORDER — MIDAZOLAM HCL 10 MG/2ML IJ SOLN
INTRAMUSCULAR | Status: DC | PRN
  Administered 2013-11-21: 1 mg via INTRAVENOUS
  Administered 2013-11-21 (×2): 2 mg via INTRAVENOUS
  Administered 2013-11-21: 1 mg via INTRAVENOUS

## 2013-11-21 MED ORDER — POTASSIUM CHLORIDE IN NACL 20-0.9 MEQ/L-% IV SOLN
INTRAVENOUS | Status: AC
  Administered 2013-11-21: 19:00:00 via INTRAVENOUS
  Administered 2013-11-22: 100 mL via INTRAVENOUS
  Filled 2013-11-21 (×3): qty 1000

## 2013-11-21 MED ORDER — DIPHENHYDRAMINE HCL 50 MG/ML IJ SOLN
INTRAMUSCULAR | Status: AC
  Filled 2013-11-21: qty 1

## 2013-11-21 MED ORDER — BUTAMBEN-TETRACAINE-BENZOCAINE 2-2-14 % EX AERO
INHALATION_SPRAY | CUTANEOUS | Status: DC | PRN
  Administered 2013-11-21: 2 via TOPICAL

## 2013-11-21 NOTE — ED Provider Notes (Signed)
CSN: [id number]     Arrival date & time 11/19/13  1948 History   First MD Initiated Contact with Patient 11/19/13 2335     Chief Complaint  Patient presents with  . Abdominal Pain  . Nausea  . Emesis     (Consider location/radiation/quality/duration/timing/severity/associated sxs/prior Treatment) HPI Comments: 58 y.o. female with history of metastatic colon cancer, biliary obstruction with stent placement, history of tachycardia on propranolol, scoliosis and bilateral foot drop, hypothyroidism who comes in to the ER with cc of nausea, vomiting, abdominal pain.  Discomfort since yesterday morning after a bowel movement - which was loose, and clear/colorless. + nausea and emesis, with diffuse abd pain ,mostly in the upper quadrants and non radiating. Pain is constant. Not on chemo currently.  Patient is a 58 y.o. female presenting with abdominal pain and vomiting. The history is provided by the patient.  Abdominal Pain Associated symptoms: nausea and vomiting   Associated symptoms: no chest pain, no dysuria, no fever and no shortness of breath   Emesis Associated symptoms: abdominal pain   Associated symptoms: no headaches     Past Medical History  Diagnosis Date  . colon ca dx'd 04/2009    chemo comp 11/2009.. colon and liver  . Spina bifida   . Scoliosis   . Hyperlipidemia   . Foot drop     bilateral,wears braces  . Mitral valve prolapse   . Hypothyroidism   . Depression   . History of chemotherapy     5-FU and Avastin 12 cycles FOLFOX  . Colon cancer 05/06/2010    metastatic  . Hepatic metastases 08/15/12    per ct abdomen  . Radiation 10/21/12-10/28/12    Palliative liver mets 54 gray in 3 fx's  . Arrhythmia 02-26-13    "rapid heart rate occ."  . History of radiation therapy 10/21/2012-10/28/2012    54 gray to liver metastasis  . History of radiation therapy 04/07/2013-04/24/2013    45 gray to liver metastasis  . Hypertension 11/19/2013   Past Surgical History  Procedure  Laterality Date  . Cholecystectomy    . Back surgery  1980 and 2010  . Knee arthroscopy  2005    left  . Tubal ligation  1973  . Colonoscopy  05/05/2010  . Colon surgery      2010  . Portacath placement  05/30/2011    tip at cavoatrialjjunction by xray  . Ercp N/A 02/27/2013    Procedure: ENDOSCOPIC RETROGRADE CHOLANGIOPANCREATOGRAPHY (ERCP);  Surgeon: Milus Banister, MD;  Location: [facility];  Service: Endoscopy;  Laterality: N/A;  . Biliary stent placement N/A 02/27/2013    Procedure: BILIARY STENT PLACEMENT;  Surgeon: Milus Banister, MD;  Location: [facility];  Service: Endoscopy;  Laterality: N/A;  . Percutaneous transhepatic cholangiograhpy and billiary drainage  03/21/2013  . Dental work  09/2013   Family History  Problem Relation Age of Onset  . Cancer Sister     breast  . Cancer Maternal Grandmother     breast  . Cancer Other 42    breast ca   History  Substance Use Topics  . Smoking status: Former Smoker    Quit date: 06/27/2006  . Smokeless tobacco: Never Used  . Alcohol Use: No   OB History   Grav Para Term Preterm Abortions TAB SAB Ect Mult Living                 Review of Systems  Constitutional: Positive for activity change. Negative for fever.  Respiratory: Negative for shortness of breath.   Cardiovascular: Negative for chest pain.  Gastrointestinal: Positive for nausea, vomiting and abdominal pain.  Genitourinary: Negative for dysuria.  Musculoskeletal: Negative for neck pain.  Neurological: Negative for headaches.  All other systems reviewed and are negative.      Allergies  Adhesive and Codeine  Home Medications  No current outpatient prescriptions on file. BP 162/88  Pulse 92  Temp(Src) 98 F (36.7 C) (Oral)  Resp 18  Ht 5' (1.524 m)  Wt 115 lb (52.164 kg)  BMI 22.46 kg/m2  SpO2 97% Physical Exam  Nursing note and vitals reviewed. Constitutional: She is oriented to person, place, and time. She appears well-developed and  well-nourished.  HENT:  Head: Normocephalic and atraumatic.  Eyes: EOM are normal. Pupils are equal, round, and reactive to light.  Neck: Neck supple.  Cardiovascular: Normal rate, regular rhythm and normal heart sounds.   No murmur heard. Pulmonary/Chest: Effort normal. No respiratory distress.  Abdominal: Soft. She exhibits no distension. There is tenderness. There is guarding. There is no rebound.  Neurological: She is alert and oriented to person, place, and time.  Skin: Skin is warm and dry.    ED Course  Procedures (including critical care time) Labs Review Labs Reviewed  CBC WITH DIFFERENTIAL - Abnormal; Notable for the following:    WBC 14.3 (*)    RBC 3.73 (*)    Hemoglobin 11.2 (*)    HCT 33.9 (*)    RDW 17.1 (*)    Neutrophils Relative % 84 (*)    Neutro Abs 11.9 (*)    Lymphocytes Relative 7 (*)    Monocytes Absolute 1.2 (*)    All other components within normal limits  COMPREHENSIVE METABOLIC PANEL - Abnormal; Notable for the following:    Potassium 3.1 (*)    Chloride 94 (*)    Glucose, Bld 179 (*)    Creatinine, Ser 0.32 (*)    Total Protein 9.7 (*)    Albumin 2.5 (*)    AST 45 (*)    Alkaline Phosphatase 301 (*)    Total Bilirubin 4.7 (*)    All other components within normal limits  LIPASE, BLOOD - Abnormal; Notable for the following:    Lipase 86 (*)    All other components within normal limits  URINALYSIS, ROUTINE W REFLEX MICROSCOPIC - Abnormal; Notable for the following:    Color, Urine AMBER (*)    APPearance CLOUDY (*)    Bilirubin Urine SMALL (*)    Protein, ur 30 (*)    Leukocytes, UA SMALL (*)    All other components within normal limits  URINE MICROSCOPIC-ADD ON - Abnormal; Notable for the following:    Squamous Epithelial / LPF FEW (*)    All other components within normal limits  COMPREHENSIVE METABOLIC PANEL - Abnormal; Notable for the following:    Potassium 3.0 (*)    Glucose, Bld 130 (*)    Creatinine, Ser 0.35 (*)    Total  Protein 8.7 (*)    Albumin 2.4 (*)    AST 38 (*)    Alkaline Phosphatase 265 (*)    Total Bilirubin 3.9 (*)    All other components within normal limits  GLUCOSE, CAPILLARY - Abnormal; Notable for the following:    Glucose-Capillary 129 (*)    All other components within normal limits  CBC WITH DIFFERENTIAL - Abnormal; Notable for the following:    RBC 3.34 (*)    Hemoglobin 9.9 (*)  HCT 30.4 (*)    RDW 17.1 (*)    Neutrophils Relative % 84 (*)    Neutro Abs 8.7 (*)    Lymphocytes Relative 8 (*)    All other components within normal limits  BASIC METABOLIC PANEL - Abnormal; Notable for the following:    Sodium 152 (*)    Potassium 3.2 (*)    CO2 34 (*)    Glucose, Bld 128 (*)    Creatinine, Ser 0.44 (*)    All other components within normal limits  CBC - Abnormal; Notable for the following:    RBC 3.24 (*)    Hemoglobin 9.7 (*)    HCT 30.6 (*)    RDW 17.7 (*)    All other components within normal limits  PREALBUMIN - Abnormal; Notable for the following:    Prealbumin 6.5 (*)    All other components within normal limits  GLUCOSE, CAPILLARY - Abnormal; Notable for the following:    Glucose-Capillary 135 (*)    All other components within normal limits  GLUCOSE, CAPILLARY - Abnormal; Notable for the following:    Glucose-Capillary 133 (*)    All other components within normal limits  GLUCOSE, CAPILLARY - Abnormal; Notable for the following:    Glucose-Capillary 137 (*)    All other components within normal limits  GLUCOSE, CAPILLARY - Abnormal; Notable for the following:    Glucose-Capillary 124 (*)    All other components within normal limits  GLUCOSE, CAPILLARY - Abnormal; Notable for the following:    Glucose-Capillary 130 (*)    All other components within normal limits  CBC WITH DIFFERENTIAL  CBC  I-STAT TROPOININ, ED   Imaging Review Ct Abdomen Pelvis W Contrast  11/20/2013   CLINICAL DATA Abdominal pain  EXAM CT ABDOMEN AND PELVIS WITH CONTRAST  TECHNIQUE  Multidetector CT imaging of the abdomen and pelvis was performed using the standard protocol following bolus administration of intravenous contrast.  CONTRAST 73mL OMNIPAQUE IOHEXOL 300 MG/ML  SOLN  COMPARISON 08/12/2013 PET-CT, 03/11/2013 abdominal CT  FINDINGS Normal heart size. Lung bases predominantly clear. Partially imaged catheter tip near the cavoatrial junction. Coronary artery calcifications.  Left percutaneous transhepatic biliary drainage catheter and an internalized biliary drain remain in place. Large hypovascular mass within the liver centrally measuring 5.8 x 4.5 cm. There is mild biliary ductal dilatation peripheral to the mass so considerably improved as compared to July 2014 though slightly increased from December. There are couple sub cm lesions within the left hepatic lobe, not confidently seen in July as index series 2, image 24 and 32.  No appreciable abnormality of spleen, pancreas, adrenal glands. Mild heterogeneous enhancement of the right kidney (series 7, image 10 as index). Symmetric excretion.  Decompressed colon limits evaluation. Partial colonic resection with suture material in the right upper quadrant. No overt colitis. The jejunal and ileal loops are decompressed. There is marked distention of the stomach and proximal duodenum with transition along the horizontal segment.  Aorta is of normal caliber with mild scattered atherosclerotic disease. There may be paraesophageal varices. Question gastrohepatic adenopathy up to 1.7 cm.  Thin walled bladder. Pelvic floor laxity. Prominent pelvic collateral vessels. Probable exophytic fibroid. No definite adnexal mass.  Unchanged osseous deformity of the pelvis and curvature of the spine.  IMPRESSION Marked gastric and proximal duodenal distention to the level of the horizontal segment. Correlate clinically if concerned for obstruction.  Central hepatic mass with mild residual intrahepatic biliary ductal dilatation, similar to mildly  increased from  December (though markedly improved from July).  A couple sub cm left hepatic lobe lesions are suspicious for metastatic disease and not definitively seen on the prior. Prominent gastrohepatic lymph node is also suspicious for malignancy.  Mild heterogeneous enhancement of the right kidney on the delayed phase is nonspecific and may reflect sequelae of prior treatment/radiation. Ascending infection not excluded. Correlate with urinalysis.  SIGNATURE  Electronically Signed   By: Carlos Levering M.D.   On: 11/20/2013 03:22     EKG Interpretation   Date/Time:  Wednesday November 19 2013 23:35:17 EDT Ventricular Rate:  104 PR Interval:  109 QRS Duration: 87 QT Interval:  355 QTC Calculation: 467 R Axis:   59 Text Interpretation:  Sinus tachycardia Atrial premature complex  Borderline T abnormalities, diffuse leads ED PHYSICIAN INTERPRETATION  AVAILABLE IN [facility] Confirmed by TEST, Record ([id number]) on  11/21/2013 11:04:53 AM      MDM   Final diagnoses:  Gastric outlet obstruction  Colon cancer metastasized to liver  Nausea and vomiting    PT comes in with cc of abd pain. Has hx of colon CA with liver mets and biliary duct/catheter placement. Pt with abd pain, n/v/diarrhea. Abd tenderness on exam. CT ordered, and it shows proximal bowel obstruction/gastric outlet syndrome like findings. NG placed, and we had over a liter of aspirate. Surgery consulted - and they will be consulting for patients care, and medicine will be admitting.  Varney Biles, MD 11/21/13 2031

## 2013-11-21 NOTE — Op Note (Signed)
[facility] [place], [address]   ENDOSCOPY PROCEDURE REPORT  PATIENT: April, Spence  MR#: [id number] BIRTHDATE: 13-Mar-1956 , 30  yrs. old GENDER: Female ENDOSCOPIST: Lafayette Dragon, MD REFERRED BY:  Dr Leisa Lenz PROCEDURE DATE:  11/21/2013 PROCEDURE:  EGD w/ biopsy ASA CLASS:     Class III INDICATIONS:  anesthetic: Cancer with mesenteric adenopathy.  Now duodenal obstruction on CT scan.  Dilated stomach.  Patient decompressed with NG tube, assess for duodenal stent placement. MEDICATIONS: These medications were titrated to patient response per physician's verbal order, Fentanyl 50 mcg IV, and Versed 6 mg IV TOPICAL ANESTHETIC: Cetacaine Spray  DESCRIPTION OF PROCEDURE: After the risks benefits and alternatives of the procedure were thoroughly explained, informed consent was obtained.  The    endoscope was introduced through the mouth and advanced to the second portion of the duodenum. Without limitations.  The instrument was slowly withdrawn as the mucosa was fully examined.      [Esophagus, there were NG-tube-induced erosions throughout the esophagus, there was mild distal esophagitis. There was no stricture  Stomach: There was 200 cc of bilious material  aspirated from the dependent portion of the stomach. The stomach did not appear to be dilated. There were several antral erosions from the NG tube-induced trauma Pyloric outlet was normal Duodenum: Duodenal bulb was edematous and there  was a duodenal outlet obstruction due large edematous folds., consistent with infiltrasting disease. There was significant resistance when scope  traversed  into the second portion of the duodenum. The lumen was almost completely obliterated at the area of papilla. Metal biliary stent wwas protruding through the papilla into the lumen, with struts  stenting of the stent protruding into the lumen. There was also a plastic removable stent in the  descending duodenum which was also exiting from the papilla. The length of the stricture was about 3 cm. There was a normal appearing second and third portion  duodenum.  biopsies were obtained from periampullary mucosa appeared friable and bled on contact with the scope. Once the scope advanced through the stricture subsequent passage its were much easier to traverse.       The scope was then withdrawn from the patient and the procedure completed.  COMPLICATIONS: There were no complications. ENDOSCOPIC IMPRESSION: Descending duodenum obstruction by an infiltrating process, resulting ina 3 cm lonf stricture extending from the postbulbar duodenum , accross Papilla, into 2nd portion duodenum, biopsies obtained. stricture dilated with passage of the scope Presence of a biliary stent - metal as well as plastic stent  RECOMMENDATIONS:  keep the NG tube out for now to see if patient in an tolerate liquids Obtain barium study tomorrow to assess the descending duodenum anatomy Keep n.p.o. for now Await results of the biopsies I think placement of the duodenal stent would be difficult of since the stricture is located at the biliary papilla and this didn't "overlap and interfere with biliary drainage  REPEAT EXAM: for EGD pending biopsy results.  eSigned:  Lafayette Dragon, MD 11/21/2013 10:47 AM   CC:  PATIENT NAME:  April, Spence MR#: [id number]

## 2013-11-21 NOTE — Progress Notes (Signed)
[facility] PROGRESS NOTE  April Spence [id number] DOB: 18-Sep-1955 DOA: 11/19/2013 PCP: Alesia Richards, MD  Brief narrative: 58 -year-old female with past medical history significant for metastatic colon cancer, biliary obstruction and stent placement who presented to [facility] ED 11/19/2013 with persistent nausea, vomiting and abdominal discomfort for past 24 hours prior to this admission. In ED, vital signs are stable. CT abdomen/pelvis revealed possible gastric outlet obstruction. 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 Recommendation is to keep NG tube out, see if pt can tolerate clear liquids and barium tomorrow 3/14. Per GI it may be difficult to place duodenal stent just because were stricture is located.  Assessment/Plan:   Principal Problem:  Abdominal pain, nausea and vomiting in the setting of possible gastric outlet obstruction  - Possible obstruction in gastric and proximal duodenal area  - pt underwent EGD with findings of descending duodenum obstruction by an infiltrating process, resulting in a 3 cm long stricture extending from the postbulbar duodenum , accross papilla, into 2nd portion duodenum, biopsies obtained, stricture dilated with passage of the scope. Recommendation is to keep NG tube out, see if pt can tolerate clear liquids and barium tomorrow 3/14. Per GI it may be difficult to place duodenal stent just because were stricture is located. - antiemetics and analgesia as needed  Active Problems:  Hypothyroidism  - Continue levothyroxine 75 mcg IV daily  Colon cancer metastasized to liver  - has biliary drain in palce History of tachycardia  - On by mouth propranolol at home. As she was NPO we used metoprolol IV 5 mg  every 6 hours  Severe protein calorie malnutrition  - Secondary to history of progressive malignancy  Hypokalemia  - Secondary to GI losses  - replete with IV potassium  Anemia of chronic disease  - Secondary to history of malignancy  - Hemoglobin 11.2 on the admission but this am 9.7; possibly due to IV fluids - no evidence of bleed - check CBC in am Leukocytosis  - White blood cell count 14.3 on admission and it has resolved to normal on subsequent blood work   Code Status: Full code  Family Communication: Family not at the bedside  Disposition Plan: Home when stable   Consultants:  Surgery  Gastroenterology Procedures:  None Antibiotics:  None   Leisa Lenz, MD  [facility] Pager [phone]  If 7PM-7AM, please contact night-coverage [email or web address] Password [email or web address] 11/21/2013, 2:39 PM   LOS: 2 days    HPI/Subjective: Pt returned from EGD, sleeping.   Objective: Filed Vitals:   11/21/13 1010 11/21/13 1017 11/21/13 1020 11/21/13 1030  BP: 169/102 158/92 158/92 155/73  Pulse: 106     Temp:      TempSrc:      Resp: 26 24 21 20   Height:      Weight:      SpO2: 100% 96% 95% 94%    Intake/Output Summary (Last 24 hours) at 11/21/13 1439 Last data filed at 11/21/13 0800  Gross per 24 hour  Intake   1600 ml  Output   1200 ml  Net    400 ml    Exam:   General:  Pt is alert, follows commands appropriately, not in acute distress  Cardiovascular: tachycardic, S1/S2 appreciated  Respiratory: Clear to auscultation bilaterally, no wheezing,  no crackles, no rhonchi  Abdomen: Soft, non tender, non distended, bowel sounds present, no guarding  Extremities: No edema, pulses DP and PT palpable bilaterally  Neuro: Grossly nonfocal  Data Reviewed: Basic Metabolic Panel:  Recent Labs Lab 11/19/13 2035 11/20/13 0800 11/21/13 0420  NA 137 140 152*  K 3.1* 3.0* 3.2*  CL 94* 96 105  CO2 28 31 34*  GLUCOSE 179* 130* 128*  BUN 10 11 13   CREATININE 0.32*  0.35* 0.44*  CALCIUM 10.0 9.7 9.5   Liver Function Tests:  Recent Labs Lab 11/19/13 2035 11/20/13 0800  AST 45* 38*  ALT 25 22  ALKPHOS 301* 265*  BILITOT 4.7* 3.9*  PROT 9.7* 8.7*  ALBUMIN 2.5* 2.4*    Recent Labs Lab 11/19/13 2035  LIPASE 86*   No results found for this basename: AMMONIA,  in the last 168 hours CBC:  Recent Labs Lab 11/19/13 2035 11/20/13 0800 11/21/13 0420  WBC 14.3* PENDING  10.4 10.2  NEUTROABS 54.0* DUPLICATE SEE ACCESSION [id number]  8.7*  --   HGB 11.2* PENDING  9.9* 9.7*  HCT 33.9* PENDING  30.4* 30.6*  MCV 90.9 PENDING  91.0 94.4  PLT 323 PENDING  255 258   Cardiac Enzymes: No results found for this basename: CKTOTAL, CKMB, CKMBINDEX, TROPONINI,  in the last 168 hours BNP: No components found with this basename: POCBNP,  CBG:  Recent Labs Lab 11/20/13 0755 11/20/13 2025 11/21/13 0535 11/21/13 0817 11/21/13 1145  GLUCAP 129* 135* 133* 137* 124*    Recent Results (from the past 240 hour(s))  BODY FLUID CULTURE     Status: None   Collection Time    11/12/13 12:13 PM      Result Value Ref Range Status   Specimen Description BILE CATH   Final   Special Requests Normal   Final   Gram Stain     Final   Value: RARE WBC PRESENT, PREDOMINANTLY PMN     FEW GRAM POSITIVE RODS     FEW YEAST     RARE GRAM NEGATIVE RODS     RARE GRAM POSITIVE COCCI   Culture     Final   Value: MULTIPLE ORGANISMS PRESENT, NONE PREDOMINANT     Note: NO GROUP A STREP (S.PYOGENES) ISOLATED NO STAPHYLOCOCCUS AUREUS ISOLATED     Performed at [facility]   Report Status 11/16/2013 FINAL   Final     Studies: Ct Abdomen Pelvis W Contrast  11/20/2013   CLINICAL DATA Abdominal pain  EXAM CT ABDOMEN AND PELVIS WITH CONTRAST  TECHNIQUE Multidetector CT imaging of the abdomen and pelvis was performed using the standard protocol following bolus administration of intravenous contrast.  CONTRAST 46mL OMNIPAQUE IOHEXOL 300 MG/ML  SOLN  COMPARISON  08/12/2013 PET-CT, 03/11/2013 abdominal CT  FINDINGS Normal heart size. Lung bases predominantly clear. Partially imaged catheter tip near the cavoatrial junction. Coronary artery calcifications.  Left percutaneous transhepatic biliary drainage catheter and an internalized biliary drain remain in place. Large hypovascular mass within the liver centrally measuring 5.8 x 4.5 cm. There is mild biliary ductal dilatation peripheral to the mass so considerably improved as compared to July 2014 though slightly increased from December. There are couple sub cm lesions within the left hepatic lobe, not confidently seen in July as index series 2, image 24 and 32.  No appreciable abnormality of spleen, pancreas, adrenal glands. Mild heterogeneous enhancement of the right kidney (series 7, image 10 as index). Symmetric excretion.  Decompressed colon limits evaluation.  Partial colonic resection with suture material in the right upper quadrant. No overt colitis. The jejunal and ileal loops are decompressed. There is marked distention of the stomach and proximal duodenum with transition along the horizontal segment.  Aorta is of normal caliber with mild scattered atherosclerotic disease. There may be paraesophageal varices. Question gastrohepatic adenopathy up to 1.7 cm.  Thin walled bladder. Pelvic floor laxity. Prominent pelvic collateral vessels. Probable exophytic fibroid. No definite adnexal mass.  Unchanged osseous deformity of the pelvis and curvature of the spine.  IMPRESSION Marked gastric and proximal duodenal distention to the level of the horizontal segment. Correlate clinically if concerned for obstruction.  Central hepatic mass with mild residual intrahepatic biliary ductal dilatation, similar to mildly increased from December (though markedly improved from July).  A couple sub cm left hepatic lobe lesions are suspicious for metastatic disease and not definitively seen on the prior. Prominent gastrohepatic lymph node  is also suspicious for malignancy.  Mild heterogeneous enhancement of the right kidney on the delayed phase is nonspecific and may reflect sequelae of prior treatment/radiation. Ascending infection not excluded. Correlate with urinalysis.  SIGNATURE  Electronically Signed   By: Carlos Levering M.D.   On: 11/20/2013 03:22    Scheduled Meds: . enoxaparin (LOVENOX) injection  40 mg Subcutaneous Q24H  . levothyroxine  75 mcg Intravenous QAC breakfast  . metoprolol  5 mg Intravenous 4 times per day   Continuous Infusions:

## 2013-11-21 NOTE — Progress Notes (Signed)
Day of Surgery  Subjective: Pt with no acute changes States she feels better this AM  Objective: Vital signs in last 24 hours: Temp:  [98.2 F (36.8 C)-98.3 F (36.8 C)] 98.2 F (36.8 C) (03/13 0616) Pulse Rate:  [102-122] 102 (03/13 0616) Resp:  [16-18] 16 (03/13 0616) BP: (107-137)/(62-77) 137/77 mmHg (03/13 0616) SpO2:  [93 %-97 %] 97 % (03/13 0616) Last BM Date: 11/19/13  Intake/Output from previous day: 03/12 0701 - 03/13 0700 In: 1886.7 [I.V.:1886.7] Out: 1950 [Urine:200; Emesis/NG output:1750] Intake/Output this shift: Total I/O In: -  Out: 250 [Emesis/NG output:250]  General appearance: alert and cooperative GI: soft, non-tender; bowel sounds normal; no masses,  no organomegaly  Lab Results:   Recent Labs  11/20/13 0800 11/21/13 0420  WBC PENDING  10.4 10.2  HGB PENDING  9.9* 9.7*  HCT PENDING  30.4* 30.6*  PLT PENDING  255 258   BMET  Recent Labs  11/20/13 0800 11/21/13 0420  NA 140 152*  K 3.0* 3.2*  CL 96 105  CO2 31 34*  GLUCOSE 130* 128*  BUN 11 13  CREATININE 0.35* 0.44*  CALCIUM 9.7 9.5   PT/INR No results found for this basename: LABPROT, INR,  in the last 72 hours ABG No results found for this basename: PHART, PCO2, PO2, HCO3,  in the last 72 hours  Studies/Results: Ct Abdomen Pelvis W Contrast  11/20/2013   CLINICAL DATA Abdominal pain  EXAM CT ABDOMEN AND PELVIS WITH CONTRAST  TECHNIQUE Multidetector CT imaging of the abdomen and pelvis was performed using the standard protocol following bolus administration of intravenous contrast.  CONTRAST 68mL OMNIPAQUE IOHEXOL 300 MG/ML  SOLN  COMPARISON 08/12/2013 PET-CT, 03/11/2013 abdominal CT  FINDINGS Normal heart size. Lung bases predominantly clear. Partially imaged catheter tip near the cavoatrial junction. Coronary artery calcifications.  Left percutaneous transhepatic biliary drainage catheter and an internalized biliary drain remain in place. Large hypovascular mass within the liver  centrally measuring 5.8 x 4.5 cm. There is mild biliary ductal dilatation peripheral to the mass so considerably improved as compared to July 2014 though slightly increased from December. There are couple sub cm lesions within the left hepatic lobe, not confidently seen in July as index series 2, image 24 and 32.  No appreciable abnormality of spleen, pancreas, adrenal glands. Mild heterogeneous enhancement of the right kidney (series 7, image 10 as index). Symmetric excretion.  Decompressed colon limits evaluation. Partial colonic resection with suture material in the right upper quadrant. No overt colitis. The jejunal and ileal loops are decompressed. There is marked distention of the stomach and proximal duodenum with transition along the horizontal segment.  Aorta is of normal caliber with mild scattered atherosclerotic disease. There may be paraesophageal varices. Question gastrohepatic adenopathy up to 1.7 cm.  Thin walled bladder. Pelvic floor laxity. Prominent pelvic collateral vessels. Probable exophytic fibroid. No definite adnexal mass.  Unchanged osseous deformity of the pelvis and curvature of the spine.  IMPRESSION Marked gastric and proximal duodenal distention to the level of the horizontal segment. Correlate clinically if concerned for obstruction.  Central hepatic mass with mild residual intrahepatic biliary ductal dilatation, similar to mildly increased from December (though markedly improved from July).  A couple sub cm left hepatic lobe lesions are suspicious for metastatic disease and not definitively seen on the prior. Prominent gastrohepatic lymph node is also suspicious for malignancy.  Mild heterogeneous enhancement of the right kidney on the delayed phase is nonspecific and may reflect sequelae of prior treatment/radiation.  Ascending infection not excluded. Correlate with urinalysis.  SIGNATURE  Electronically Signed   By: Carlos Levering M.D.   On: 11/20/2013 03:22     Anti-infectives: Anti-infectives   None      Assessment/Plan: GOO  Pt to undergo EGD today- hope stent can be placed palliatively Will follow  LOS: 2 days    Rosario Jacks., [facility] 11/21/2013

## 2013-11-22 ENCOUNTER — Inpatient Hospital Stay (HOSPITAL_COMMUNITY): Payer: Medicare HMO

## 2013-11-22 DIAGNOSIS — E039 Hypothyroidism, unspecified: Secondary | ICD-10-CM

## 2013-11-22 LAB — GLUCOSE, CAPILLARY
Glucose-Capillary: 111 mg/dL — ABNORMAL HIGH (ref 70–99)
Glucose-Capillary: 112 mg/dL — ABNORMAL HIGH (ref 70–99)
Glucose-Capillary: 100 mg/dL — ABNORMAL HIGH (ref 70–99)
Glucose-Capillary: 153 mg/dL — ABNORMAL HIGH (ref 70–99)
Glucose-Capillary: 127 mg/dL — ABNORMAL HIGH (ref 70–99)

## 2013-11-22 NOTE — Progress Notes (Signed)
Progress Note   Subjective  feels okay, hungry   Objective   Vital signs in last 24 hours: Temp:  [97.8 F (36.6 C)-98.3 F (36.8 C)] 98.3 F (36.8 C) (03/14 0545) Pulse Rate:  [92-102] 102 (03/14 0545) Resp:  [16-18] 16 (03/14 0545) BP: (129-162)/(78-88) 129/78 mmHg (03/14 0545) SpO2:  [97 %-98 %] 98 % (03/14 0545) Weight:  [121 lb 7.6 oz (55.1 kg)] 121 lb 7.6 oz (55.1 kg) (03/14 0545) Last BM Date: 11/19/13 General:    white female in NAD Lungs: Respirations even and unlabored, lungs CTA bilaterally Abdomen:  Soft, nontender and nondistended. Normal bowel sounds. Neurologic:  Alert and oriented,  grossly normal neurologically. Psych:  Cooperative. Normal mood and affect.  Lab Results:  Recent Labs  11/19/13 2035 11/20/13 0800 11/21/13 0420  WBC 14.3* PENDING  10.4 10.2  HGB 11.2* PENDING  9.9* 9.7*  HCT 33.9* PENDING  30.4* 30.6*  PLT 323 PENDING  255 258   BMET  Recent Labs  11/19/13 2035 11/20/13 0800 11/21/13 0420  NA 137 140 152*  K 3.1* 3.0* 3.2*  CL 94* 96 105  CO2 28 31 34*  GLUCOSE 179* 130* 128*  BUN 10 11 13   CREATININE 0.32* 0.35* 0.44*  CALCIUM 10.0 9.7 9.5   LFT  Recent Labs  11/20/13 0800  PROT 8.7*  ALBUMIN 2.4*  AST 38*  ALT 22  ALKPHOS 265*  BILITOT 3.9*    EGD DESCRIPTION OF PROCEDURE: After the risks benefits and alternatives  of the procedure were thoroughly explained, informed consent was  obtained. The endoscope was introduced through the mouth and  advanced to the second portion of the duodenum. Without  limitations. The instrument was slowly withdrawn as the mucosa was  fully examined.  [Esophagus, there were NG-tube-induced erosions throughout the  esophagus, there was mild distal esophagitis. There was no  stricture  Stomach: There was 200 cc of bilious material aspirated from the  dependent portion of the stomach. The stomach did not appear to be  dilated. There were several antral erosions from the NG    tube-induced trauma  Pyloric outlet was normal  Duodenum: Duodenal bulb was edematous and there was a duodenal  outlet obstruction due large edematous folds., consistent with  infiltrasting disease. There was significant resistance when  scope traversed into the second portion of the duodenum. The  lumen was almost completely obliterated at the area of papilla.  Metal biliary stent wwas protruding through the papilla into the  lumen, with struts stenting of the stent protruding into the  lumen. There was also a plastic removable stent in the descending  duodenum which was also exiting from the papilla. The length of the  stricture was about 3 cm. There was a normal appearing second and  third portion duodenum. biopsies were obtained from periampullary  mucosa appeared friable and bled on contact with the scope. Once  the scope advanced through the stricture subsequent passage its  were much easier to traverse. The scope was then withdrawn  from the patient and the procedure completed.  COMPLICATIONS: There were no complications.  ENDOSCOPIC IMPRESSION:  Descending duodenum obstruction by an infiltrating process,  resulting ina 3 cm lonf stricture extending from the postbulbar  duodenum , accross Papilla, into 2nd portion duodenum, biopsies  obtained.  stricture dilated with passage of the scope  Presence of a biliary stent - metal as well as plastic stent  RECOMMENDATIONS:  keep the NG tube out for now  to see if patient in an tolerate  liquids  Obtain barium study tomorrow to assess the descending duodenum  anatomy  Keep n.p.o. for now  Await results of the biopsies  I think placement of the duodenal stent would be difficult of since  the stricture is located at the biliary papilla and this didn't  "overlap and interfere with biliary drainage  REPEAT EXAM: for EGD pending biopsy results.  eSigned: Lafayette Dragon, MD 11/21/2013 10:47 AM     Assessment / Plan:   1. Metastatic  colon cancer, s/p right colectomy 2010. She has been treated with chemotherapy, last dose in December. She is s/p radiation in August. Followed by Dr. Benay Spice   2. History of biliary obstruction secondary to #1, currently has internal / external drain managed by IR. Bilirubin stable at 3.9. She had drain exchange done 11/14/13.   3. Descending duodenal obstruction, ? Secondary to infiltrating process. Biopsies pending. Continue NGT decompression. She is for upper GI today to better characterize stricture. Surgery following. Check electrolytes in am. Can try sips of clears after UGI series    LOS: 3 days   Tye Savoy  11/22/2013, 10:39 AM  Attending MD note:   I have taken a history, examined the patient, and reviewed the chart. I agree with the Advanced Practitioner's impression and recommendations. UGI confirms 4 cm long "nonobstructing" stricture in proximal duodenum, 9 mm in diameter, centered at the Papilla. Will continue full liquid diet  [facility] Pager # [phone]

## 2013-11-22 NOTE — Progress Notes (Signed)
UGI discussed with Dr Pascal Lux, no obstruction. Malignant appearing 4cm long stricture centered around Papilla ( stent),9 mm lumen. Tolerating full liquids. Long term disposition still not decided- _ duodenal stent placement could preclude replacement of the biliary stent.Follow bilirubin for impeding biliary obstruction

## 2013-11-22 NOTE — Progress Notes (Signed)
1 Day Post-Op  Subjective: She is doing well.  Denies any nausea with NG out.  Having BM's.  Currently npo. Was not able to have stent placed due to position of duodenal stricture.  Getting UGI today.    Objective: Vital signs in last 24 hours: Temp:  [97.8 F (36.6 C)-98.3 F (36.8 C)] 98.3 F (36.8 C) (03/14 0545) Pulse Rate:  [92-107] 102 (03/14 0545) Resp:  [16-26] 16 (03/14 0545) BP: (129-169)/(67-126) 129/78 mmHg (03/14 0545) SpO2:  [94 %-100 %] 98 % (03/14 0545) Weight:  [121 lb 7.6 oz (55.1 kg)] 121 lb 7.6 oz (55.1 kg) (03/14 0545) Last BM Date: 03/11/151600 from NG in ED, nothing on floor currently recorded. Afebrile, VSS   Intake/Output from previous day: 03/13 0701 - 03/14 0700 In: 1500 [I.V.:1500] Out: 650 [Urine:400; Emesis/NG output:250] Intake/Output this shift:    General appearance: alert, cooperative and no distress Abd: soft  Lab Results:   Recent Labs  11/20/13 0800 11/21/13 0420  WBC PENDING  10.4 10.2  HGB PENDING  9.9* 9.7*  HCT PENDING  30.4* 30.6*  PLT PENDING  255 258    BMET  Recent Labs  11/20/13 0800 11/21/13 0420  NA 140 152*  K 3.0* 3.2*  CL 96 105  CO2 31 34*  GLUCOSE 130* 128*  BUN 11 13  CREATININE 0.35* 0.44*  CALCIUM 9.7 9.5   PT/INR No results found for this basename: LABPROT, INR,  in the last 72 hours   Recent Labs Lab 11/19/13 2035 11/20/13 0800  AST 45* 38*  ALT 25 22  ALKPHOS 301* 265*  BILITOT 4.7* 3.9*  PROT 9.7* 8.7*  ALBUMIN 2.5* 2.4*     Lipase     Component Value Date/Time   LIPASE 86* 11/19/2013 2035     Studies/Results: No results found.  Medications: . enoxaparin (LOVENOX) injection  40 mg Subcutaneous Q24H  . levothyroxine  75 mcg Intravenous QAC breakfast  . metoprolol  5 mg Intravenous 4 times per day    Assessment/Plan Abdominal pain and nausea, now with gastric outlet obstruction. 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 Biliary obstruction with IR placed billiary drain left hepatic duct.03/13/13 Spina Bifida/scoliosis MVP Depression Hypertension   Plan:  UGI today.  We will follow with you.      LOS: 3 days    Guinn Delarosa C. [id number]

## 2013-11-22 NOTE — Progress Notes (Signed)
[facility] PROGRESS NOTE  April Spence NTI:[id number] DOB: [birth year] DOA: 11/19/2013 PCP: Alesia Richards, MD  Brief narrative: 58 -year-old female with past medical history significant for metastatic colon cancer, biliary obstruction and stent placement who presented to [facility] ED 11/19/2013 with persistent nausea, vomiting and abdominal discomfort for past 24 hours prior to this admission. In ED, vital signs are stable. CT abdomen/pelvis revealed possible gastric outlet obstruction. 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 Recommendation is to keep NG tube out, see if pt can tolerate clear liquids and to have UGI 3/14. Per GI it may be difficult to place duodenal stent just because were stricture is located.   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 Recommendation is to keep NG tube out, see if pt can tolerate clear liquids and barium today 3/14. Per GI it may be difficult to place duodenal stent just because were stricture is located.  - antiemetics and analgesia as needed  Active Problems:  Hypothyroidism  - Continue levothyroxine 75 mcg IV daily  Colon cancer metastasized to liver  - has biliary drain in palce  History of tachycardia  - On by mouth propranolol at home. As she was NPO we are using metoprolol IV 5 mg  every 6 hours  Severe protein calorie malnutrition  - Secondary to history of progressive malignancy  Hypokalemia  - Secondary to GI losses  - replete with IV potassium  Anemia of chronic disease  - Secondary to history of malignancy  - Hemoglobin 11.2 on the admission and then 9.7. No evidence of bleed. Check CBC in am Leukocytosis  - White blood cell count 14.3 on admission and it has resolved to normal on subsequent blood work   Code Status: Full code  Family Communication: Family at the bedside  Disposition Plan: Home when stable   Consultants:  Surgery  Gastroenterology Procedures:  None Antibiotics:  None  Leisa Lenz, MD  [facility] Pager [phone]  If 7PM-7AM, please contact night-coverage [email or web address] Password [facility] 11/22/2013, 6:21 AM   LOS: 3 days    HPI/Subjective: No acute overnight events.   Objective: Filed Vitals:   11/21/13 1030 11/21/13 1400 11/21/13 2147 11/22/13 0545  BP: 155/73 162/88 150/81 129/78  Pulse:  92 102 102  Temp:  98 F (36.7 C) 97.8 F (36.6 C) 98.3 F (36.8 C)  TempSrc:  Oral Oral Oral  Resp: 20 18 18 16   Height:      Weight:    55.1 kg (121 lb 7.6 oz)  SpO2: 94% 97% 98% 98%    Intake/Output Summary (Last 24 hours) at 11/22/13 0932 Last data filed at 11/21/13 2200  Gross per 24 hour  Intake   1500 ml  Output    650 ml  Net    850 ml    Exam:   General:  Pt is alert, follows commands appropriately, not in acute distress  Cardiovascular: Regular rate and  rhythm, S1/S2, no murmurs, no rubs, no gallops  Respiratory: Clear to auscultation bilaterally, no wheezing, no crackles, no rhonchi  Abdomen: Soft, non tender, non distended, bowel sounds present, no guarding  Extremities: No edema, pulses DP and PT palpable bilaterally  Neuro: Grossly nonfocal  Data Reviewed: Basic Metabolic Panel:  Recent Labs Lab 11/19/13 2035 11/20/13 0800 11/21/13 0420  NA 137 140 152*  K 3.1* 3.0* 3.2*  CL 94* 96 105   CO2 28 31 34*  GLUCOSE 179* 130* 128*  BUN 10 11 13   CREATININE 0.32* 0.35* 0.44*  CALCIUM 10.0 9.7 9.5   Liver Function Tests:  Recent Labs Lab 11/19/13 2035 11/20/13 0800  AST 45* 38*  ALT 25 22  ALKPHOS 301* 265*  BILITOT 4.7* 3.9*  PROT 9.7* 8.7*  ALBUMIN 2.5* 2.4*    Recent Labs Lab 11/19/13 2035  LIPASE 86*   No results found for this basename: AMMONIA,  in the last 168 hours CBC:  Recent Labs Lab 11/19/13 2035 11/20/13 0800 11/21/13 0420  WBC 14.3* PENDING  10.4 10.2  NEUTROABS 63.8* DUPLICATE SEE ACCESSION [id number]  8.7*  --   HGB 11.2* PENDING  9.9* 9.7*  HCT 33.9* PENDING  30.4* 30.6*  MCV 90.9 PENDING  91.0 94.4  PLT 323 PENDING  255 258   Cardiac Enzymes: No results found for this basename: CKTOTAL, CKMB, CKMBINDEX, TROPONINI,  in the last 168 hours BNP: No components found with this basename: POCBNP,  CBG:  Recent Labs Lab 11/21/13 0817 11/21/13 1145 11/21/13 1948 11/21/13 2325 11/22/13 0408  GLUCAP 137* 124* 130* 117* 111*    BODY FLUID CULTURE     Status: None   Collection Time    11/12/13 12:13 PM      Result Value Ref Range Status   Specimen Description BILE CATH   Final   Special Requests Normal   Final   Culture     Final   Value: MULTIPLE ORGANISMS PRESENT, NONE PREDOMINANT     Performed at [facility]   Report Status 11/16/2013 FINAL   Final     Studies: No results found.  Scheduled Meds: . enoxaparin (LOVENOX) injection  40 mg Subcutaneous Q24H  . levothyroxine  75 mcg Intravenous QAC breakfast  . metoprolol  5 mg Intravenous 4 times per day   Continuous Infusions: . 0.9 % NaCl with KCl 20 mEq / L 100 mL (11/22/13 0538)

## 2013-11-23 LAB — HEPATIC FUNCTION PANEL
ALK PHOS: 230 U/L — AB (ref 39–117)
ALT: 19 U/L (ref 0–35)
AST: 38 U/L — ABNORMAL HIGH (ref 0–37)
Albumin: 2 g/dL — ABNORMAL LOW (ref 3.5–5.2)
Bilirubin, Direct: 1.9 mg/dL — ABNORMAL HIGH (ref 0.0–0.3)
Indirect Bilirubin: 1.5 mg/dL — ABNORMAL HIGH (ref 0.3–0.9)
TOTAL PROTEIN: 7.4 g/dL (ref 6.0–8.3)
Total Bilirubin: 3.4 mg/dL — ABNORMAL HIGH (ref 0.3–1.2)

## 2013-11-23 LAB — BASIC METABOLIC PANEL
BUN: 8 mg/dL (ref 6–23)
CHLORIDE: 101 meq/L (ref 96–112)
CO2: 24 meq/L (ref 19–32)
Calcium: 8.8 mg/dL (ref 8.4–10.5)
Creatinine, Ser: 0.33 mg/dL — ABNORMAL LOW (ref 0.50–1.10)
GFR calc Af Amer: >90 mL/min (ref >90)
GFR calc non Af Amer: >90 mL/min (ref >90)
Glucose, Bld: 117 mg/dL — ABNORMAL HIGH (ref 70–99)
Potassium: 3.4 mEq/L — ABNORMAL LOW (ref 3.7–5.3)
SODIUM: 136 meq/L — AB (ref 137–147)

## 2013-11-23 LAB — CBC
HEMATOCRIT: 30.7 % — AB (ref 36.0–46.0)
Hemoglobin: 9.8 g/dL — ABNORMAL LOW (ref 12.0–15.0)
MCH: 29.3 pg (ref 26.0–34.0)
MCHC: 31.9 g/dL (ref 30.0–36.0)
MCV: 91.9 fL (ref 78.0–100.0)
PLATELETS: 199 10*3/uL (ref 150–400)
RBC: 3.34 MIL/uL — ABNORMAL LOW (ref 3.87–5.11)
RDW: 17.5 % — ABNORMAL HIGH (ref 11.5–15.5)
WBC: 8.8 10*3/uL (ref 4.0–10.5)

## 2013-11-23 LAB — TSH: TSH: 2.566 u[IU]/mL (ref 0.350–4.500)

## 2013-11-23 LAB — GLUCOSE, CAPILLARY
GLUCOSE-CAPILLARY: 118 mg/dL — AB (ref 70–99)
Glucose-Capillary: 106 mg/dL — ABNORMAL HIGH (ref 70–99)
Glucose-Capillary: 132 mg/dL — ABNORMAL HIGH (ref 70–99)

## 2013-11-23 MED ORDER — PROPRANOLOL HCL ER 80 MG PO CP24
80.0000 mg | ORAL_CAPSULE | Freq: Every day | ORAL | Status: DC
  Administered 2013-11-23: 80 mg via ORAL
  Filled 2013-11-23 (×2): qty 1

## 2013-11-23 MED ORDER — INSULIN ASPART 100 UNIT/ML ~~LOC~~ SOLN: 0.0000 [IU] | Freq: Three times a day (TID) | SUBCUTANEOUS | Status: DC

## 2013-11-23 NOTE — Progress Notes (Signed)
[facility] PROGRESS NOTE  April Spence [id number] DOB: 04-Feb-1956 DOA: 11/19/2013 PCP: Alesia Richards, MD  Brief narrative: 58 -year-old female with past medical history significant for metastatic colon cancer, biliary obstruction and stent placement who presented to [facility] ED 11/19/2013 with persistent nausea, vomiting and abdominal discomfort for past 24 hours prior to this admission. In ED, vital signs are stable. CT abdomen/pelvis revealed possible gastric outlet obstruction. 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 Recommendation is to keep NG tube out, see if pt can tolerate clear liquids and to have UGI 3/14. Per GI it may be difficult to place duodenal stent just because were stricture is located.   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  - antiemetics and analgesia as needed  - GI series with extrinsic, malignant-appearing proximal to the stricture which is nonobstructive. Patient tolerating well liquid diet. Hypothyroidism  - Continue levothyroxine 75 mcg IV daily  - Given tachycardia will recheck a TSH Colon cancer metastasized to liver  - has biliary drain in palce  History of tachycardia  - On by mouth propranolol at home, restart that 3/15 - Recheck a TSH Severe protein calorie malnutrition  - Secondary to history of  progressive malignancy  - Discussion about the surgical J. Hypokalemia  - Secondary to GI losses  - replete with IV potassium  Anemia of chronic disease  - Secondary to history of malignancy  - Hemoglobin 11.2 on the admission and then 9.7, 9.8 this morning. No evidence of bleed. Leukocytosis  - White blood cell count 14.3 on admission and it has resolved to normal on subsequent blood work   Code Status: Full code  Family Communication: Husband at the bedside Disposition Plan: Home when stable   Consultants:  Surgery  Gastroenterology   Procedures:  None   Antibiotics:  None  HPI/Subjective: No acute overnight events, tolerating breakfast well.   Objective: Filed Vitals:   11/22/13 0545 11/22/13 1400 11/22/13 2255 11/23/13 0602  BP: 129/78 152/83 128/74 144/82  Pulse: 102 106 111 111  Temp: 98.3 F (36.8 C) 97.4 F (36.3 C) 98.5 F (36.9 C) 98.5 F (36.9 C)  TempSrc: Oral Oral Oral Oral  Resp: 16 18 16 16   Height:      Weight: 55.1 kg (121 lb 7.6 oz)   56.5 kg (124 lb 9 oz)  SpO2: 98% 99% 95% 97%    Intake/Output Summary (Last 24 hours) at 11/23/13 0847 Last data filed at 11/22/13 2255  Gross per 24 hour  Intake    440 ml  Output    700 ml  Net   -260 ml    Exam:  General:  Pt is alert, follows commands appropriately, not in acute distress  Cardiovascular: Regular rate and rhythm, S1/S2, no murmurs, no rubs, no gallops  Respiratory: Clear to auscultation bilaterally, no wheezing, no crackles, no rhonchi  Abdomen: Soft, non tender, non distended, bowel sounds present, no guarding  Extremities: No edema, pulses DP and PT palpable bilaterally  Neuro: Grossly nonfocal  Data Reviewed: Basic Metabolic Panel:  Recent Labs Lab 11/19/13 2035 11/20/13 0800 11/21/13 0420 11/23/13 0508  NA 137 140 152* 136*  K 3.1* 3.0* 3.2* 3.4*  CL 94* 96 105 101  CO2 28 31 34* 24  GLUCOSE 179* 130* 128* 117*  BUN 10 11 13 8   CREATININE 0.32* 0.35* 0.44* 0.33*   CALCIUM 10.0 9.7 9.5 8.8   Liver Function Tests:  Recent Labs Lab 11/19/13 2035 11/20/13 0800 11/23/13 0508  AST 45* 38* 38*  ALT 25 22 19   ALKPHOS 301* 265* 230*  BILITOT 4.7* 3.9* 3.4*  PROT 9.7* 8.7* 7.4  ALBUMIN 2.5* 2.4* 2.0*    Recent Labs Lab 11/19/13 2035  LIPASE 86*   CBC:  Recent Labs Lab 11/19/13 2035 11/20/13 0800 11/21/13 0420 11/23/13 0508  WBC 14.3* PENDING  10.4 10.2 8.8  NEUTROABS 25.3* DUPLICATE SEE ACCESSION [id number]  8.7*  --   --   HGB 11.2* PENDING  9.9* 9.7* 9.8*  HCT 33.9* PENDING  30.4* 30.6* 30.7*  MCV 90.9 PENDING  91.0 94.4 91.9  PLT 323 PENDING  255 258 199   CBG:  Recent Labs Lab 11/22/13 1850 11/22/13 1952 11/22/13 2357 11/23/13 0358 11/23/13 0825  GLUCAP 153* 127* 118* 106* 132*    BODY FLUID CULTURE     Status: None   Collection Time    11/12/13 12:13 PM      Result Value Ref Range Status   Specimen Description BILE CATH   Final   Special Requests Normal   Final   Culture     Final   Value: MULTIPLE ORGANISMS PRESENT, NONE PREDOMINANT     Performed at [facility]   Report Status 11/16/2013 FINAL   Final     Studies: Dg Ugi W/high Density W/kub  11/22/2013   CLINICAL DATA:  Proximal duodenal stricture. Evaluate anatomy prior to possible duodenal stenting.  EXAM: UPPER GI SERIES W/HIGH DENSITY W/KUB  TECHNIQUE: After obtaining a scout radiograph a routine upper GI series was performed using thin density barium.  FLUOROSCOPY TIME:  3 min 6 seconds  COMPARISON:  Abdominal CT 11/20/2013  FINDINGS: Scout image shows a metallic biliary stent in unchanged position. There is an internalized biliary catheter from the left, also unchanged. The bowel gas pattern is nonspecific, but non obstructive. The moderately gas-filled stomach is deviated to the left secondary to scoliosis (per previous CT).  Limited fluoroscopic imaging was performed due to the clinical circumstances, specific clinical question, and recent  endoscopy. The elongated appearing stomach contained minimal retained fluid. The gastric folds appeared thickened, but this was likely a function of incomplete distention. With gravity, contrast readily flowed through the duodenum, which is upwardly retracted and distorted in an area of fixed narrowing with irregular mucosal surface. The area of narrowing is approximately 4 cm in length and the minimal luminal diameter is roughly 9 mm. Narrowing is centered at the level of the ampulla, as localized by the biliary stent. There is distension of the second and third portions of the duodenum secondary to impression by the SMA, as confirmed on preceding abdominal CT. Contrast would flow through this segment in a positionaly dependent manner.  IMPRESSION: Extrinsic, malignant-appearing proximal duodenal stricture - described above. The stricture is currently nonobstructive.   Electronically Signed   By: Jorje Guild M.D.   On: 11/22/2013 13:55    Scheduled Meds: . enoxaparin (LOVENOX) injection  40 mg Subcutaneous Q24H  . levothyroxine  75 mcg Intravenous QAC breakfast  . metoprolol  5 mg Intravenous 4 times per day   Continuous Infusions:     Costin M. Cruzita Lederer, MD [facility] [phone] If 7 PM - 7 AM, please contact night-coverage at [email or web address], password [facility]

## 2013-11-23 NOTE — Progress Notes (Signed)
Progress Note   Subjective  sleepy from ativan, otherwise feel somewhat okay, tolerating fulls   Objective   Vital signs in last 24 hours: Temp:  [97.4 F (36.3 C)-98.5 F (36.9 C)] 98.5 F (36.9 C) (03/15 0602) Pulse Rate:  [106-111] 111 (03/15 0602) Resp:  [16-18] 16 (03/15 0602) BP: (128-152)/(74-83) 144/82 mmHg (03/15 0602) SpO2:  [95 %-99 %] 97 % (03/15 0602) Weight:  [124 lb 9 oz (56.5 kg)] 124 lb 9 oz (56.5 kg) (03/15 0602) Last BM Date: 11/22/13 General:    Pleasant white female in NAD Heart:  Regular rate and rhythm Abdomen:  Soft, nontender and nondistended. Normal bowel sounds. Extremities:  Without edema. Neurologic:  Alert and oriented,  grossly normal neurologically. Psych:  Cooperative. Normal mood and affect.   Lab Results:  Recent Labs  11/21/13 0420 11/23/13 0508  WBC 10.2 8.8  HGB 9.7* 9.8*  HCT 30.6* 30.7*  PLT 258 199   BMET  Recent Labs  11/21/13 0420 11/23/13 0508  NA 152* 136*  K 3.2* 3.4*  CL 105 101  CO2 34* 24  GLUCOSE 128* 117*  BUN 13 8  CREATININE 0.44* 0.33*  CALCIUM 9.5 8.8   LFT  Recent Labs  11/23/13 0508  PROT 7.4  ALBUMIN 2.0*  AST 38*  ALT 19  ALKPHOS 230*  BILITOT 3.4*  BILIDIR 1.9*  IBILI 1.5*    Studies/Results: Dg Ugi W/high Density W/kub  11/22/2013   CLINICAL DATA:  Proximal duodenal stricture. Evaluate anatomy prior to possible duodenal stenting.  EXAM: UPPER GI SERIES W/HIGH DENSITY W/KUB  TECHNIQUE: After obtaining a scout radiograph a routine upper GI series was performed using thin density barium.  FLUOROSCOPY TIME:  3 min 6 seconds  COMPARISON:  Abdominal CT 11/20/2013  FINDINGS: Scout image shows a metallic biliary stent in unchanged position. There is an internalized biliary catheter from the left, also unchanged. The bowel gas pattern is nonspecific, but non obstructive. The moderately gas-filled stomach is deviated to the left secondary to scoliosis (per previous CT).  Limited fluoroscopic  imaging was performed due to the clinical circumstances, specific clinical question, and recent endoscopy. The elongated appearing stomach contained minimal retained fluid. The gastric folds appeared thickened, but this was likely a function of incomplete distention. With gravity, contrast readily flowed through the duodenum, which is upwardly retracted and distorted in an area of fixed narrowing with irregular mucosal surface. The area of narrowing is approximately 4 cm in length and the minimal luminal diameter is roughly 9 mm. Narrowing is centered at the level of the ampulla, as localized by the biliary stent. There is distension of the second and third portions of the duodenum secondary to impression by the SMA, as confirmed on preceding abdominal CT. Contrast would flow through this segment in a positionaly dependent manner.  IMPRESSION: Extrinsic, malignant-appearing proximal duodenal stricture - described above. The stricture is currently nonobstructive.   Electronically Signed   By: Jorje Guild M.D.   On: 11/22/2013 13:55     Assessment / Plan:   1. Metastatic colon cancer, s/p right colectomy 2010 and chemoradiation. Last dose of chemo was December. Now with malignant appearing, nonobstructive proximal duodenal stricture (at location of papilla). Tolerating full liquids. Duodenal stent may not be feasible because of location (may preclude replacement of biliary stent in future). Surgery following, still considering gastrojejunostomy but family wants to first discuss things with Dr. Benay Spice  2. History of biliary obstruction secondary to #1, currently has internal /  external drain managed by IR. Bilirubin stable . She had drain exchange done 11/14/13.    LOS: 4 days   April Spence  11/23/2013, 9:00 AM Attending MD note:   I have taken a history, examined the patient, and reviewed the chart. I agree with the Advanced Practitioner's impression and recommendations. Total bilirubin has leveled  off at 3.4. Over all clinically improved. Long term prognosis guarded.Palliative bypass vs decompression PEG  May be a future consideration.  [facility] Pager # [phone]

## 2013-11-23 NOTE — Progress Notes (Addendum)
2 Days Post-Op  Subjective: She is doing well.  Denies any nausea with liquids.  Having BM's.  UGI shows stricture without obstruction  Objective: Vital signs in last 24 hours: Temp:  [97.4 F (36.3 C)-98.5 F (36.9 C)] 98.5 F (36.9 C) (03/15 0602) Pulse Rate:  [106-111] 111 (03/15 0602) Resp:  [16-18] 16 (03/15 0602) BP: (128-152)/(74-83) 144/82 mmHg (03/15 0602) SpO2:  [95 %-99 %] 97 % (03/15 0602) Weight:  [124 lb 9 oz (56.5 kg)] 124 lb 9 oz (56.5 kg) (03/15 0602) Last BM Date: 11/22/13 Afebrile, VSS   Intake/Output from previous day: 03/14 0701 - 03/15 0700 In: 440 [P.O.:440] Out: 700 [Urine:700] Intake/Output this shift:    General appearance: alert, cooperative and no distress Abd: soft  Lab Results:   Recent Labs  11/21/13 0420 11/23/13 0508  WBC 10.2 8.8  HGB 9.7* 9.8*  HCT 30.6* 30.7*  PLT 258 199    BMET  Recent Labs  11/21/13 0420 11/23/13 0508  NA 152* 136*  K 3.2* 3.4*  CL 105 101  CO2 34* 24  GLUCOSE 128* 117*  BUN 13 8  CREATININE 0.44* 0.33*  CALCIUM 9.5 8.8   PT/INR No results found for this basename: LABPROT, INR,  in the last 72 hours   Recent Labs Lab 11/19/13 2035 11/20/13 0800 11/23/13 0508  AST 45* 38* 38*  ALT 25 22 19   ALKPHOS 301* 265* 230*  BILITOT 4.7* 3.9* 3.4*  PROT 9.7* 8.7* 7.4  ALBUMIN 2.5* 2.4* 2.0*     Lipase     Component Value Date/Time   LIPASE 86* 11/19/2013 2035     Studies/Results: Dg Ugi W/high Density W/kub  11/22/2013   CLINICAL DATA:  Proximal duodenal stricture. Evaluate anatomy prior to possible duodenal stenting.  EXAM: UPPER GI SERIES W/HIGH DENSITY W/KUB  TECHNIQUE: After obtaining a scout radiograph a routine upper GI series was performed using thin density barium.  FLUOROSCOPY TIME:  3 min 6 seconds  COMPARISON:  Abdominal CT 11/20/2013  FINDINGS: Scout image shows a metallic biliary stent in unchanged position. There is an internalized biliary catheter from the left, also unchanged.  The bowel gas pattern is nonspecific, but non obstructive. The moderately gas-filled stomach is deviated to the left secondary to scoliosis (per previous CT).  Limited fluoroscopic imaging was performed due to the clinical circumstances, specific clinical question, and recent endoscopy. The elongated appearing stomach contained minimal retained fluid. The gastric folds appeared thickened, but this was likely a function of incomplete distention. With gravity, contrast readily flowed through the duodenum, which is upwardly retracted and distorted in an area of fixed narrowing with irregular mucosal surface. The area of narrowing is approximately 4 cm in length and the minimal luminal diameter is roughly 9 mm. Narrowing is centered at the level of the ampulla, as localized by the biliary stent. There is distension of the second and third portions of the duodenum secondary to impression by the SMA, as confirmed on preceding abdominal CT. Contrast would flow through this segment in a positionaly dependent manner.  IMPRESSION: Extrinsic, malignant-appearing proximal duodenal stricture - described above. The stricture is currently nonobstructive.   Electronically Signed   By: Jorje Guild M.D.   On: 11/22/2013 13:55    Medications: . enoxaparin (LOVENOX) injection  40 mg Subcutaneous Q24H  . levothyroxine  75 mcg Intravenous QAC breakfast  . metoprolol  5 mg Intravenous 4 times per day    Assessment/Plan Abdominal pain and nausea, gastric outlet obstruction resolved.  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 Biliary obstruction with IR placed billiary drain left hepatic duct.03/13/13 Spina Bifida/scoliosis MVP Depression Hypertension   Plan:  Can cont full liquids for now. Pt most likely will not tolerate solid foods with  this stricture. Surgical option of gastro J explained to pt.      LOS: 4 days    Castle Lamons C. [id number]

## 2013-11-24 ENCOUNTER — Other Ambulatory Visit: Payer: Self-pay | Admitting: *Deleted

## 2013-11-24 ENCOUNTER — Encounter (HOSPITAL_COMMUNITY): Payer: Self-pay | Admitting: Internal Medicine

## 2013-11-24 ENCOUNTER — Encounter: Payer: Self-pay | Admitting: Internal Medicine

## 2013-11-24 ENCOUNTER — Telehealth: Payer: Self-pay | Admitting: Oncology

## 2013-11-24 DIAGNOSIS — R109 Unspecified abdominal pain: Secondary | ICD-10-CM

## 2013-11-24 DIAGNOSIS — M216X9 Other acquired deformities of unspecified foot: Secondary | ICD-10-CM

## 2013-11-24 DIAGNOSIS — R634 Abnormal weight loss: Secondary | ICD-10-CM

## 2013-11-24 DIAGNOSIS — K315 Obstruction of duodenum: Principal | ICD-10-CM

## 2013-11-24 DIAGNOSIS — R630 Anorexia: Secondary | ICD-10-CM

## 2013-11-24 DIAGNOSIS — Q059 Spina bifida, unspecified: Secondary | ICD-10-CM

## 2013-11-24 DIAGNOSIS — C180 Malignant neoplasm of cecum: Secondary | ICD-10-CM

## 2013-11-24 DIAGNOSIS — M4120 Other idiopathic scoliosis, site unspecified: Secondary | ICD-10-CM

## 2013-11-24 DIAGNOSIS — M549 Dorsalgia, unspecified: Secondary | ICD-10-CM

## 2013-11-24 DIAGNOSIS — R209 Unspecified disturbances of skin sensation: Secondary | ICD-10-CM

## 2013-11-24 LAB — BASIC METABOLIC PANEL
BUN: 6 mg/dL (ref 6–23)
CALCIUM: 8.3 mg/dL — AB (ref 8.4–10.5)
CO2: 24 meq/L (ref 19–32)
Chloride: 102 mEq/L (ref 96–112)
Creatinine, Ser: 0.32 mg/dL — ABNORMAL LOW (ref 0.50–1.10)
Glucose, Bld: 98 mg/dL (ref 70–99)
Potassium: 3.6 mEq/L — ABNORMAL LOW (ref 3.7–5.3)
SODIUM: 136 meq/L — AB (ref 137–147)

## 2013-11-24 LAB — HEPATIC FUNCTION PANEL
ALT: 19 U/L (ref 0–35)
AST: 35 U/L (ref 0–37)
Albumin: 1.8 g/dL — ABNORMAL LOW (ref 3.5–5.2)
Alkaline Phosphatase: 225 U/L — ABNORMAL HIGH (ref 39–117)
Bilirubin, Direct: 2 mg/dL — ABNORMAL HIGH (ref 0.0–0.3)
Indirect Bilirubin: 1.5 mg/dL — ABNORMAL HIGH (ref 0.3–0.9)
TOTAL PROTEIN: 7 g/dL (ref 6.0–8.3)
Total Bilirubin: 3.5 mg/dL — ABNORMAL HIGH (ref 0.3–1.2)

## 2013-11-24 LAB — CBC
HCT: 32.2 % — ABNORMAL LOW (ref 36.0–46.0)
Hemoglobin: 10.2 g/dL — ABNORMAL LOW (ref 12.0–15.0)
MCH: 29.5 pg (ref 26.0–34.0)
MCHC: 31.7 g/dL (ref 30.0–36.0)
MCV: 93.1 fL (ref 78.0–100.0)
PLATELETS: 200 10*3/uL (ref 150–400)
RBC: 3.46 MIL/uL — AB (ref 3.87–5.11)
RDW: 17.3 % — ABNORMAL HIGH (ref 11.5–15.5)
WBC: 10.3 10*3/uL (ref 4.0–10.5)

## 2013-11-24 LAB — GLUCOSE, CAPILLARY
GLUCOSE-CAPILLARY: 143 mg/dL — AB (ref 70–99)
GLUCOSE-CAPILLARY: 129 mg/dL — AB (ref 70–99)
GLUCOSE-CAPILLARY: 93 mg/dL (ref 70–99)
Glucose-Capillary: 125 mg/dL — ABNORMAL HIGH (ref 70–99)

## 2013-11-24 MED ORDER — LEVOTHYROXINE SODIUM 150 MCG PO TABS: 150.0000 ug | ORAL_TABLET | Freq: Every day | ORAL | Status: AC

## 2013-11-24 MED ORDER — TRAMADOL-ACETAMINOPHEN 37.5-325 MG PO TABS: 1.0000 | ORAL_TABLET | Freq: Four times a day (QID) | ORAL | Status: AC | PRN

## 2013-11-24 MED ORDER — PROCHLORPERAZINE MALEATE 10 MG PO TABS: 10.0000 mg | ORAL_TABLET | Freq: Four times a day (QID) | ORAL | Status: AC | PRN

## 2013-11-24 MED ORDER — AMITRIPTYLINE HCL 25 MG PO TABS: 50.0000 mg | ORAL_TABLET | Freq: Every evening | ORAL | Status: AC

## 2013-11-24 MED ORDER — MAGIC MOUTHWASH: 10.0000 mL | Freq: Four times a day (QID) | ORAL | Status: AC | PRN

## 2013-11-24 MED ORDER — CHLORHEXIDINE GLUCONATE 0.12 % MT SOLN
15.0000 mL | Freq: Two times a day (BID) | OROMUCOSAL | Status: AC
Start: 2013-11-24 — End: ?

## 2013-11-24 MED ORDER — FLUOXETINE HCL 20 MG PO CAPS: 20.0000 mg | ORAL_CAPSULE | Freq: Every morning | ORAL | Status: AC

## 2013-11-24 MED ORDER — LORAZEPAM 0.5 MG PO TABS: 0.5000 mg | ORAL_TABLET | Freq: Two times a day (BID) | ORAL | Status: DC

## 2013-11-24 MED ORDER — DOCUSATE SODIUM 100 MG PO CAPS: 100.0000 mg | ORAL_CAPSULE | Freq: Two times a day (BID) | ORAL | Status: AC

## 2013-11-24 MED ORDER — MORPHINE SULFATE ER 30 MG PO TBCR: 30.0000 mg | EXTENDED_RELEASE_TABLET | Freq: Two times a day (BID) | ORAL | Status: DC

## 2013-11-24 MED ORDER — OXYCODONE-ACETAMINOPHEN 5-325 MG PO TABS: 2.0000 | ORAL_TABLET | ORAL | Status: DC | PRN

## 2013-11-24 MED ORDER — ONDANSETRON HCL 8 MG PO TABS: 8.0000 mg | ORAL_TABLET | Freq: Two times a day (BID) | ORAL | Status: AC | PRN

## 2013-11-24 MED ORDER — HEPARIN SOD (PORK) LOCK FLUSH 100 UNIT/ML IV SOLN
500.0000 [IU] | INTRAVENOUS | Status: AC | PRN
  Administered 2013-11-24: 500 [IU]

## 2013-11-24 MED ORDER — MEGESTROL ACETATE 40 MG/ML PO SUSP
200.0000 mg | Freq: Two times a day (BID) | ORAL | Status: AC
Start: 2013-11-24 — End: ?

## 2013-11-24 MED ORDER — DIPHENOXYLATE-ATROPINE 2.5-0.025 MG PO TABS: 1.0000 | ORAL_TABLET | Freq: Four times a day (QID) | ORAL | Status: DC | PRN

## 2013-11-24 MED ORDER — PROPRANOLOL HCL 80 MG PO TABS: 80.0000 mg | ORAL_TABLET | Freq: Every morning | ORAL | Status: DC

## 2013-11-24 NOTE — Discharge Summary (Signed)
Physician Discharge Summary  April Spence IDP:[id number] DOB: 1956/05/06 DOA: 11/19/2013  PCP: Alesia Richards, MD  Admit date: 11/19/2013 Discharge date: 11/24/2013  Time spent: 35 minutes  Recommendations for Outpatient Follow-up:  1. Followup with oncology as an outpatient in a week 2. Followup with primary care provider in one to 2 weeks   Discharge Diagnoses:  Principal Problem:   Gastric outlet obstruction Active Problems:   Hypothyroidism   Hypertension   Colon cancer metastasized to liver   Anemia of chronic disease   Hypokalemia   Protein-calorie malnutrition, severe  Discharge Condition: Stable  Diet recommendation: Liquid  Filed Weights   11/22/13 0545 11/23/13 0602 11/24/13 0500  Weight: 55.1 kg (121 lb 7.6 oz) 56.5 kg (124 lb 9 oz) 57.7 kg (127 lb 3.3 oz)   History of present illness:  April Spence is a 58 y.o. female with history of metastatic colon cancer, biliary obstruction with stent placement, history of tachycardia on propranolol, scoliosis and bilateral foot drop, hypothyroidism started to experience nausea vomiting with abdominal discomfort since yesterday morning after a bowel movement. Since symptoms were persistent patient came to the ER and had a CT abdomen pelvis done which is concerning for gastric outlet obstruction. On-call surgeon Dr. Excell Seltzer was consulted. They will be seeing patient in consult. Presently patient is placed on NG tube suction and 1600 cc of fluid has been drained. Patient otherwise denies any chest pain or shortness of breath.   Hospital Course:  Abdominal pain, nausea and vomiting in the setting of possible gastric outlet obstruction - gastroenterology, oncology and surgery has been consulted. Patient underwent an EGD on 3/13 which showed findings of descending duodenum obstruction by an infiltrating process, resulting in a 3 cm long stricture extending from the postbulbar duodenum, accross papilla, into 2nd portion  duodenum. She also underwent an upper GI series which showed extrinsic, malignant-appearing proximal to the stricture which is nonobstructive. Patient was initially n.p.o., after EGD her diet was advanced to a full liquid diet which she seems to be tolerating pretty well, without nausea vomiting or abdominal pain. Patient and her husband discussed with Dr. Learta Codding from oncology regarding the new findings, and they are willing to try chemotherapy to see if they'll help with the tumor and the stricture, and this is to be started as an outpatient. Surgery was involved in her care for consideration of gastrojejunostomy, which is currently on hold for now and see the response to chemotherapy. Patient was discharged home with close outpatient oncology followup. Nutrition has been consulted regarding optimal diet. Hypothyroidism - Continue levothyroxine, TSH was checked and was within normal limits. Colon cancer metastasized to liver - has biliary drain in place. Bilirubin has been stable. History of tachycardia - continue propranolol Severe protein calorie malnutrition - Secondary to history of progressive malignancy  Hypokalemia - improved with repletion Anemia of chronic disease - Secondary to history of malignancy, hemoglobin stable, without any evidence of bleed. Leukocytosis - White blood cell count 14.3 on admission and it has resolved to normal on subsequent blood work   Procedures:  EGD 3/13 Descending duodenum obstruction by an infiltrating process, resulting ina 3 cm lonf stricture extending from the postbulbar  duodenum , accross Papilla, into 2nd portion duodenum, biopsies obtained. stricture dilated with passage of the scope Presence of a biliary stent - metal as well as plastic stent  Consultations:  Gastroenterology  Surgery  Oncology  Discharge Exam: Filed Vitals:   11/23/13 1400 11/23/13 2100 11/24/13 0500  11/24/13 0844  BP: 138/81 125/74 158/89 122/79  Pulse: 95 93 95 89  Temp:  97.5 F (36.4 C) 98.2 F (36.8 C) 97.6 F (36.4 C) 98.4 F (36.9 C)  TempSrc: Oral Oral Oral Oral  Resp: 16 18 18 18   Height:      Weight:   57.7 kg (127 lb 3.3 oz)   SpO2: 98% 97% 99% 96%    General: No acute distress Cardiovascular: Regular in rhythm Respiratory: Clear to auscultation bilaterally  Discharge Instructions   Future Appointments Provider Department Dept Phone   12/04/2013 2:45 PM Owens Shark, NP [facility] [phone]   12/09/2013 10:30 AM [facility] [phone]   12/09/2013 11:00 AM Ladell Pier, MD [facility] [phone]   12/26/2013 12:30 PM [facility] [phone]   12/26/2013 2:30 PM [facility] [phone]   05/27/2014 11:00 AM Unk Pinto, MD [facility] [phone]       Medication List    STOP taking these medications       ALPRAZolam 0.25 MG tablet  Commonly known as:  XANAX      TAKE these medications       amitriptyline 25 MG tablet  Commonly known as:  ELAVIL  Take 2-3 tablets (50-75 mg total) by mouth every evening. As needed for chronic back pain     chlorhexidine 0.12 % solution  Commonly known as:  PERIDEX  Use as directed 15 mLs in the mouth or throat 2 (two) times daily.     diphenoxylate-atropine 2.5-0.025 MG per tablet  Commonly known as:  LOMOTIL  Take 1 tablet by mouth 4 (four) times daily as needed for diarrhea or loose stools.     docusate sodium 100 MG capsule  Commonly known as:  COLACE  Take 1 capsule (100 mg total) by mouth 2 (two) times daily.     FLUoxetine 20 MG capsule  Commonly known as:  PROZAC  Take 1 capsule (20 mg total) by mouth every morning.     levothyroxine 150 MCG tablet  Commonly known as:  SYNTHROID, LEVOTHROID  Take 1 tablet (150 mcg total) by mouth daily  before breakfast.     lidocaine-prilocaine cream  Commonly known as:  EMLA  Apply 1 application topically as needed (use on port).     LORazepam 0.5 MG tablet  Commonly known as:  ATIVAN  Take 1 tablet (0.5 mg total) by mouth every 12 (twelve) hours.     magic mouthwash Soln  Take 10 mLs by mouth 4 (four) times daily as needed for mouth pain. Mouthsores     megestrol 40 MG/ML suspension  Commonly known as:  MEGACE ORAL  Take 5 mLs (200 mg total) by mouth 2 (two) times daily.     morphine 30 MG 12 hr tablet  Commonly known as:  MS CONTIN  Take 1 tablet (30 mg total) by mouth every 12 (twelve) hours.     nystatin 100000 UNIT/GM Powd  Apply 1 g topically daily as needed (for rash). In summer on irritated areas     ondansetron 8 MG tablet  Commonly known as:  ZOFRAN  Take 1 tablet (8 mg total) by mouth every 12 (twelve) hours as needed for nausea or vomiting.     oxyCODONE-acetaminophen 5-325 MG per tablet  Commonly known as:  PERCOCET/ROXICET  Take 2  tablets by mouth every 4 (four) hours as needed for moderate pain or severe pain.     prochlorperazine 10 MG tablet  Commonly known as:  COMPAZINE  Take 1 tablet (10 mg total) by mouth every 6 (six) hours as needed (nausea).     propranolol 80 MG tablet  Commonly known as:  INDERAL  Take 1 tablet (80 mg total) by mouth every morning.     traMADol-acetaminophen 37.5-325 MG per tablet  Commonly known as:  ULTRACET  Take 1 tablet by mouth every 6 (six) hours as needed for moderate pain.     Vitamin D-3 5000 UNITS Tabs  Take 5,000 Units by mouth 2 (two) times daily.           Follow-up Information   Follow up with MCKEOWN,WILLIAM DAVID, MD In 1 week.   Specialty:  Internal Medicine   Contact information:   [address] [place] [address] [phone]       Follow up with Thornton Papas, MD In 1 week.   Specialty:  Oncology   Contact information:   [address] [place]  [address] [phone]       The results of significant diagnostics from this hospitalization (including imaging, microbiology, ancillary and laboratory) are listed below for reference.    Significant Diagnostic Studies: Ct Abdomen Pelvis W Contrast  11/20/2013   CLINICAL DATA Abdominal pain  EXAM CT ABDOMEN AND PELVIS WITH CONTRAST  TECHNIQUE Multidetector CT imaging of the abdomen and pelvis was performed using the standard protocol following bolus administration of intravenous contrast.  CONTRAST 54mL OMNIPAQUE IOHEXOL 300 MG/ML  SOLN  COMPARISON 08/12/2013 PET-CT, 03/11/2013 abdominal CT  FINDINGS Normal heart size. Lung bases predominantly clear. Partially imaged catheter tip near the cavoatrial junction. Coronary artery calcifications.  Left percutaneous transhepatic biliary drainage catheter and an internalized biliary drain remain in place. Large hypovascular mass within the liver centrally measuring 5.8 x 4.5 cm. There is mild biliary ductal dilatation peripheral to the mass so considerably improved as compared to July 2014 though slightly increased from December. There are couple sub cm lesions within the left hepatic lobe, not confidently seen in July as index series 2, image 24 and 32.  No appreciable abnormality of spleen, pancreas, adrenal glands. Mild heterogeneous enhancement of the right kidney (series 7, image 10 as index). Symmetric excretion.  Decompressed colon limits evaluation. Partial colonic resection with suture material in the right upper quadrant. No overt colitis. The jejunal and ileal loops are decompressed. There is marked distention of the stomach and proximal duodenum with transition along the horizontal segment.  Aorta is of normal caliber with mild scattered atherosclerotic disease. There may be paraesophageal varices. Question gastrohepatic adenopathy up to 1.7 cm.  Thin walled bladder. Pelvic floor laxity. Prominent pelvic collateral vessels. Probable exophytic fibroid. No  definite adnexal mass.  Unchanged osseous deformity of the pelvis and curvature of the spine.  IMPRESSION Marked gastric and proximal duodenal distention to the level of the horizontal segment. Correlate clinically if concerned for obstruction.  Central hepatic mass with mild residual intrahepatic biliary ductal dilatation, similar to mildly increased from December (though markedly improved from July).  A couple sub cm left hepatic lobe lesions are suspicious for metastatic disease and not definitively seen on the prior. Prominent gastrohepatic lymph node is also suspicious for malignancy.  Mild heterogeneous enhancement of the right kidney on the delayed phase is nonspecific and may reflect sequelae of prior treatment/radiation. Ascending infection not excluded.  Correlate with urinalysis.  SIGNATURE  Electronically Signed   By: Carlos Levering M.D.   On: 11/20/2013 03:22   Ir Cholan Exist Tube  11/14/2013   CLINICAL DATA:  58 year old female with a history of colon cancer metastatic to the liver and resulting in malignant obstructive jaundice. She has an indwelling left 12 [place] biliary drain but has suffered from continuous drainage, pain and malaise since the tube was upsized from 10 to 12 [place] on 10/28/2013. She was most recently re-evaluated on 11/12/2013 at which time a cholangiogram was done. She returns Interventional Radiology for continued copious drainage around the tube entry site as well as a mild intermittent mL bili a. Repeat labs today demonstrate elevation of her bilirubin to 4.3 and a stable hemoglobin and hematocrit. Her INR is elevated to 1.7.  EXAM: IR CATHETER TUBE CHANGE; CHOLANGIOGRAM VIA EXISTING CATHETER  Date: 11/14/2013  TECHNIQUE: Informed consent was obtained from the patient following explanation of the procedure, risks, benefits and alternatives. The patient understands, agrees and consents for the procedure. All questions were addressed. A time out was performed.  Maximal barrier  sterile technique utilized including caps, mask, sterile gowns, sterile gloves, large sterile drape, hand hygiene, and Betadine skin prep.  An initial injection of contrast material through the existing tube was performed. The tube is patent. Contrast material empties into the duodenum. No additional side holes are present on this tube. Contrast material refluxes along the tube and into several non drains and mildly dilated bile ducts in the medial segment of the left hepatic lobe.  An Amplatz wire was advanced through the tube and into the jejunum. The tube was removed over the wire. A Kumpe the catheter was advanced with the proximal small bowel. A Bentson wire was then used to measure the distance from the locking loop to Chest 2 cm central to the biliary entrance site. This measurement was then used to modify a new Cook 12 French biliary drain with approximately 4 additional sideholes.  The tube was advanced over the Amplatz wire and formed with the locking loop in the duodenum. The Bentson wire was reinserted to the tube in an image attained with the tip of the wire at the most peripheral side hole. A gentle hand injection of contrast material through the tube confirmed adequate drainage of the left intrahepatic ducts. The tube was flushed and secured to the skin with 0 Prolene suture. There was no immediate complication. The patient tolerated the procedure well. A sterile bandage was placed in the tube was connected to a gravity bag for drainage.  ANESTHESIA/SEDATION: Moderate (conscious) sedation was used. Four mg Versed, 100 mcg Fentanyl were administered intravenously. The patient's vital signs were monitored continuously by radiology nursing throughout the procedure. 3.375 mg of a Zosyn administered intravenously within 1 hr of the procedure.  Sedation Time: 30 minutes  CONTRAST:  Twenty mL Omnipaque  FLUOROSCOPY TIME:  3 min 18 seconds  PROCEDURE: 1. Cholangiogram through existing tube in 2. Tube exchange  for a modified 12 [place] biliary tube Interventional Radiologist:  Criselda Peaches, MD  IMPRESSION: 1. Through the tube cholangiogram demonstrates incomplete drainage of several branch ducts in the left hepatic lobe. 2. Replacement of the existing 12 [place] biliary drain with a new 12 [place] biliary drain modified with additional sideholes. The location of the most peripheral sidehole was documented by the tip of a Bentson wire.  PLAN: Patient is to drain the tube to external drainage for 48 hr. Her husband  will cap the tube Sunday evening. Unless she experiences continued copious drainage from the tube insertion site, she can return to interventional radiology in 8 weeks for a routine biliary tube check and change. Her husband will continue to flush the tube once daily with 10 mL of sterile saline.  Signed,  Criselda Peaches, MD  [facility]  [facility]   Electronically Signed   By: Jacqulynn Cadet M.D.   On: 11/14/2013 17:15   Ir Cholan Exist Tube  11/12/2013   CLINICAL DATA:  Biliary obstruction, chronic indwelling internal external biliary drain catheter  EXAM: CHOLANGIOGRAM THROUGH EXISTING CATHETER  Date:  3/4/20153/12/2013 12:56 PM  Radiologist:  Jerilynn Mages. Daryll Brod, MD  Guidance:  Fluoroscopic  MEDICATIONS AND MEDICAL HISTORY: 1% lidocaine locally  ANESTHESIA/SEDATION: None.  CONTRAST:  5 cc Omnipaque 300  FLUOROSCOPY TIME:  6 seconds  PROCEDURE: Informed consent was obtained from the patient following explanation of the procedure, risks, benefits and alternatives. The patient understands, agrees and consents for the procedure. All questions were addressed. A time out was performed.  Under sterile conditions, the existing left 12 [place] internal external biliary drain catheter was injected with contrast. Drain catheter position is unchanged. Retention loop the duodenum. Biliary system is decompressed. Tube is widely patent. No current need for exchange.  System was decompressed by syringe aspiration.  COMPLICATIONS: No immediate  IMPRESSION: Adequately positioned left internal external biliary drain with decompression of the biliary system. Tube is widely patent. No further intervention warranted.   Electronically Signed   By: Daryll Brod M.D.   On: 11/12/2013 13:09   Ir Biliary Dilitation  10/30/2013   CLINICAL DATA:  Metastatic colon carcinoma with biliary obstruction. Long-term indwelling internal/ external biliary drain catheter. Recent leaking around the skin entry site end elevated bilirubin despite returning catheter to external drainage.  EXAM: PERCUTANEOUS BILIARY DRAINAGE CATHETER EXCHANGE UNDER FLUOROSCOPY  BILIARY DILATATION  TECHNIQUE: The procedure, risks (including but not limited to bleeding, infection, organ damage ), benefits, and alternatives were explained to the patient. Questions regarding the procedure were encouraged and answered. The patient understands and consents to the procedure. The left percutaneous internal external biliary drain catheter and surrounding skin were prepped with Betadine, draped in usual sterile fashion.  Intravenous Fentanyl and Versed were administered as conscious sedation during continuous cardiorespiratory monitoring by the radiology RN, with a total moderate sedation time of 20 minutes.  A small amount of contrast was injected through the catheter to opacify the biliary tree. The catheter was cut and exchanged over a angled Glidewire for a 5 [place] Kumpe catheter, advanced into the duodenum. The Glidewire was exchanged for an Amplatz wire. A 12French catheter would not advance beyond the level of the previously placed biliary stent. For this reason, the biliary confluence was initially partially dilated with a 12French vascular dilator, and than further dilated with a 4 mm x 4 cm Mustang angioplasty balloon over the guidewire. This allowed advancement of the 12French biliary drain catheter, distal pigtail  formed in the lumen of the duodenum, proximal sideholes spanning the level of obstruction. Contrast injection confirms appropriate catheter positioning with opacification of the decompressed left biliary tree.  Catheter secured externally with 0 Prolene suture and capped to allowed internal drainage. The patient tolerated the procedure well, with no immediate complication.  FLUOROSCOPY TIME:  2 min 30 seconds  IMPRESSION: 1. Technically successful exchange of and up sizing of left percutaneous internal external biliary drain catheter, using a 12  [place] device. 2. 4 mm dilatation of the biliary confluence to allow catheter placement.   Electronically Signed   By: Arne Cleveland M.D.   On: 10/30/2013 12:53   Ir Catheter Tube Change  11/14/2013   CLINICAL DATA:  58 year old female with a history of colon cancer metastatic to the liver and resulting in malignant obstructive jaundice. She has an indwelling left 12 [place] biliary drain but has suffered from continuous drainage, pain and malaise since the tube was upsized from 10 to 12 [place] on 10/28/2013. She was most recently re-evaluated on 11/12/2013 at which time a cholangiogram was done. She returns Interventional Radiology for continued copious drainage around the tube entry site as well as a mild intermittent mL bili a. Repeat labs today demonstrate elevation of her bilirubin to 4.3 and a stable hemoglobin and hematocrit. Her INR is elevated to 1.7.  EXAM: IR CATHETER TUBE CHANGE; CHOLANGIOGRAM VIA EXISTING CATHETER  Date: 11/14/2013  TECHNIQUE: Informed consent was obtained from the patient following explanation of the procedure, risks, benefits and alternatives. The patient understands, agrees and consents for the procedure. All questions were addressed. A time out was performed.  Maximal barrier sterile technique utilized including caps, mask, sterile gowns, sterile gloves, large sterile drape, hand hygiene, and Betadine skin prep.  An initial injection of  contrast material through the existing tube was performed. The tube is patent. Contrast material empties into the duodenum. No additional side holes are present on this tube. Contrast material refluxes along the tube and into several non drains and mildly dilated bile ducts in the medial segment of the left hepatic lobe.  An Amplatz wire was advanced through the tube and into the jejunum. The tube was removed over the wire. A Kumpe the catheter was advanced with the proximal small bowel. A Bentson wire was then used to measure the distance from the locking loop to Chest 2 cm central to the biliary entrance site. This measurement was then used to modify a new Cook 12 French biliary drain with approximately 4 additional sideholes.  The tube was advanced over the Amplatz wire and formed with the locking loop in the duodenum. The Bentson wire was reinserted to the tube in an image attained with the tip of the wire at the most peripheral side hole. A gentle hand injection of contrast material through the tube confirmed adequate drainage of the left intrahepatic ducts. The tube was flushed and secured to the skin with 0 Prolene suture. There was no immediate complication. The patient tolerated the procedure well. A sterile bandage was placed in the tube was connected to a gravity bag for drainage.  ANESTHESIA/SEDATION: Moderate (conscious) sedation was used. Four mg Versed, 100 mcg Fentanyl were administered intravenously. The patient's vital signs were monitored continuously by radiology nursing throughout the procedure. 3.375 mg of a Zosyn administered intravenously within 1 hr of the procedure.  Sedation Time: 30 minutes  CONTRAST:  Twenty mL Omnipaque  FLUOROSCOPY TIME:  3 min 18 seconds  PROCEDURE: 1. Cholangiogram through existing tube in 2. Tube exchange for a modified 12 [place] biliary tube Interventional Radiologist:  Criselda Peaches, MD  IMPRESSION: 1. Through the tube cholangiogram demonstrates incomplete  drainage of several branch ducts in the left hepatic lobe. 2. Replacement of the existing 12 [place] biliary drain with a new 12 [place] biliary drain modified with additional sideholes. The location of the most peripheral sidehole was documented by the tip of a Bentson wire.  PLAN: Patient is to drain  the tube to external drainage for 48 hr. Her husband will cap the tube Sunday evening. Unless she experiences continued copious drainage from the tube insertion site, she can return to interventional radiology in 8 weeks for a routine biliary tube check and change. Her husband will continue to flush the tube once daily with 10 mL of sterile saline.  Signed,  Criselda Peaches, MD  [facility]  [facility]   Electronically Signed   By: Jacqulynn Cadet M.D.   On: 11/14/2013 17:15   Ir Catheter Tube Change  10/30/2013   CLINICAL DATA:  Metastatic colon carcinoma with biliary obstruction. Long-term indwelling internal/ external biliary drain catheter. Recent leaking around the skin entry site end elevated bilirubin despite returning catheter to external drainage.  EXAM: PERCUTANEOUS BILIARY DRAINAGE CATHETER EXCHANGE UNDER FLUOROSCOPY  BILIARY DILATATION  TECHNIQUE: The procedure, risks (including but not limited to bleeding, infection, organ damage ), benefits, and alternatives were explained to the patient. Questions regarding the procedure were encouraged and answered. The patient understands and consents to the procedure. The left percutaneous internal external biliary drain catheter and surrounding skin were prepped with Betadine, draped in usual sterile fashion.  Intravenous Fentanyl and Versed were administered as conscious sedation during continuous cardiorespiratory monitoring by the radiology RN, with a total moderate sedation time of 20 minutes.  A small amount of contrast was injected through the catheter to opacify the biliary tree. The catheter was cut and  exchanged over a angled Glidewire for a 5 [place] Kumpe catheter, advanced into the duodenum. The Glidewire was exchanged for an Amplatz wire. A 12French catheter would not advance beyond the level of the previously placed biliary stent. For this reason, the biliary confluence was initially partially dilated with a 12French vascular dilator, and than further dilated with a 4 mm x 4 cm Mustang angioplasty balloon over the guidewire. This allowed advancement of the 12French biliary drain catheter, distal pigtail formed in the lumen of the duodenum, proximal sideholes spanning the level of obstruction. Contrast injection confirms appropriate catheter positioning with opacification of the decompressed left biliary tree.  Catheter secured externally with 0 Prolene suture and capped to allowed internal drainage. The patient tolerated the procedure well, with no immediate complication.  FLUOROSCOPY TIME:  2 min 30 seconds  IMPRESSION: 1. Technically successful exchange of and up sizing of left percutaneous internal external biliary drain catheter, using a 12 [place] device. 2. 4 mm dilatation of the biliary confluence to allow catheter placement.   Electronically Signed   By: Arne Cleveland M.D.   On: 10/30/2013 12:53   Dg Ugi W/high Density W/kub  11/22/2013   CLINICAL DATA:  Proximal duodenal stricture. Evaluate anatomy prior to possible duodenal stenting.  EXAM: UPPER GI SERIES W/HIGH DENSITY W/KUB  TECHNIQUE: After obtaining a scout radiograph a routine upper GI series was performed using thin density barium.  FLUOROSCOPY TIME:  3 min 6 seconds  COMPARISON:  Abdominal CT 11/20/2013  FINDINGS: Scout image shows a metallic biliary stent in unchanged position. There is an internalized biliary catheter from the left, also unchanged. The bowel gas pattern is nonspecific, but non obstructive. The moderately gas-filled stomach is deviated to the left secondary to scoliosis (per previous CT).  Limited fluoroscopic imaging  was performed due to the clinical circumstances, specific clinical question, and recent endoscopy. The elongated appearing stomach contained minimal retained fluid. The gastric folds appeared thickened, but this was likely a function of incomplete distention. With gravity, contrast  readily flowed through the duodenum, which is upwardly retracted and distorted in an area of fixed narrowing with irregular mucosal surface. The area of narrowing is approximately 4 cm in length and the minimal luminal diameter is roughly 9 mm. Narrowing is centered at the level of the ampulla, as localized by the biliary stent. There is distension of the second and third portions of the duodenum secondary to impression by the SMA, as confirmed on preceding abdominal CT. Contrast would flow through this segment in a positionaly dependent manner.  IMPRESSION: Extrinsic, malignant-appearing proximal duodenal stricture - described above. The stricture is currently nonobstructive.   Electronically Signed   By: Jorje Guild M.D.   On: 11/22/2013 13:55   Labs: Basic Metabolic Panel:  Recent Labs Lab 11/19/13 2035 11/20/13 0800 11/21/13 0420 11/23/13 0508 11/24/13 0439  NA 137 140 152* 136* 136*  K 3.1* 3.0* 3.2* 3.4* 3.6*  CL 94* 96 105 101 102  CO2 28 31 34* 24 24  GLUCOSE 179* 130* 128* 117* 98  BUN 10 11 13 8 6   CREATININE 0.32* 0.35* 0.44* 0.33* 0.32*  CALCIUM 10.0 9.7 9.5 8.8 8.3*   Liver Function Tests:  Recent Labs Lab 11/19/13 2035 11/20/13 0800 11/23/13 0508 11/24/13 0439  AST 45* 38* 38* 35  ALT 25 22 19 19   ALKPHOS 301* 265* 230* 225*  BILITOT 4.7* 3.9* 3.4* 3.5*  PROT 9.7* 8.7* 7.4 7.0  ALBUMIN 2.5* 2.4* 2.0* 1.8*    Recent Labs Lab 11/19/13 2035  LIPASE 86*   CBC:  Recent Labs Lab 11/19/13 2035 11/20/13 0800 11/21/13 0420 11/23/13 0508 11/24/13 0439  WBC 14.3* PENDING  10.4 10.2 8.8 10.3  NEUTROABS [id number]* DUPLICATE SEE ACCESSION [id number]  8.7*  --   --   --   HGB 11.2* PENDING   9.9* 9.7* 9.8* 10.2*  HCT 33.9* PENDING  30.4* 30.6* 30.7* 32.2*  MCV 90.9 PENDING  91.0 94.4 91.9 93.1  PLT 323 PENDING  255 258 199 200   CBG:  Recent Labs Lab 11/23/13 0825 11/23/13 1337 11/23/13 1631 11/23/13 2205 11/24/13 0729  GLUCAP 132* 143* 125* 129* 93   Signed:  Kortez Murtagh  [facility] 11/24/2013, 11:48 AM

## 2013-11-24 NOTE — Progress Notes (Signed)
IP PROGRESS NOTE  Subjective:   April Spence is well-known to me with a history of metastatic colon cancer, currently maintained off of specific therapy. She was admitted 11/20/2013 with nausea and vomiting and found to have a duodenal obstruction. She reports improvement in the nausea. She is now tolerating a liquid diet. She continues to have abdominal pain that was better controlled with MS Contin.  Objective: Vital signs in last 24 hours: Blood pressure 158/89, pulse 95, temperature 97.6 F (36.4 C), temperature source Oral, resp. rate 18, height 5' (1.524 m), weight 127 lb 3.3 oz (57.7 kg), SpO2 99.00%.  Intake/Output from previous day: 03/15 0701 - 03/16 0700 In: 80 [P.O.:80] Out: 1000 [Urine:1000]  Physical Exam:  HEENT: Scleral icterus Lungs: Diminished breath sounds at the lower chest bilaterally with scattered rhonchi, no respiratory distress Cardiac: Regular rate and rhythm Abdomen: No hepatomegaly, nontender, left abdomen biliary drain Extremities: No leg edema   Portacath/PICC-without erythema  Lab Results:  Recent Labs  11/23/13 0508 11/24/13 0439  WBC 8.8 10.3  HGB 9.8* 10.2*  HCT 30.7* 32.2*  PLT 199 200    BMET  Recent Labs  11/23/13 0508 11/24/13 0439  NA 136* 136*  K 3.4* 3.6*  CL 101 102  CO2 24 24  GLUCOSE 117* 98  BUN 8 6  CREATININE 0.33* 0.32*  CALCIUM 8.8 8.3*    Studies/Results: Dg Ugi W/high Density W/kub  11/22/2013   CLINICAL DATA:  Proximal duodenal stricture. Evaluate anatomy prior to possible duodenal stenting.  EXAM: UPPER GI SERIES W/HIGH DENSITY W/KUB  TECHNIQUE: After obtaining a scout radiograph a routine upper GI series was performed using thin density barium.  FLUOROSCOPY TIME:  3 min 6 seconds  COMPARISON:  Abdominal CT 11/20/2013  FINDINGS: Scout image shows a metallic biliary stent in unchanged position. There is an internalized biliary catheter from the left, also unchanged. The bowel gas pattern is nonspecific, but non  obstructive. The moderately gas-filled stomach is deviated to the left secondary to scoliosis (per previous CT).  Limited fluoroscopic imaging was performed due to the clinical circumstances, specific clinical question, and recent endoscopy. The elongated appearing stomach contained minimal retained fluid. The gastric folds appeared thickened, but this was likely a function of incomplete distention. With gravity, contrast readily flowed through the duodenum, which is upwardly retracted and distorted in an area of fixed narrowing with irregular mucosal surface. The area of narrowing is approximately 4 cm in length and the minimal luminal diameter is roughly 9 mm. Narrowing is centered at the level of the ampulla, as localized by the biliary stent. There is distension of the second and third portions of the duodenum secondary to impression by the SMA, as confirmed on preceding abdominal CT. Contrast would flow through this segment in a positionaly dependent manner.  IMPRESSION: Extrinsic, malignant-appearing proximal duodenal stricture - described above. The stricture is currently nonobstructive.   Electronically Signed   By: Jorje Guild M.D.   On: 11/22/2013 13:55    Medications: I have reviewed the patient's current medications.  Assessment/Plan:  1. 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 Now maintained off of specific therapy.  2. Spina bifida with severe scoliosis, chronic bilateral foot drop/lower leg and foot numbness  3. Biliary obstruction, status post placement of a metallic biliary stent 26/94/8546, placement of multiple external biliary drains, jaundice persist  4. Abdomen/back pain-likely secondary to metastatic colon cancer, though the source for pain has not been defined  5. Anorexia/weight loss-maintained on Megace prior to  hospital admission  6. Duodenal obstruction-likely secondary to metastatic colon cancer, status post an endoscopy 11/21/2013 confirming a 3 cm stricture of the duodenum, biopsies obtained with pathology pending, stricture dilated with passage of the endoscope  7. Poor dentition-dental extractions planned  I discussed the duodenal obstruction and treatment options with Ms. Boeder and her husband. I agree with the recommendation of Dr. Rosenbower to reserve surgery for palliation of the intractable nausea/vomiting. She appears stable for discharge on a liquid diet. We will discuss salvage systemic chemotherapy options as an outpatient.  Recommendations: 1. Resume outpatient narcotic regimen 2. Liquid diet-nutrition evaluation 3. [facility] for the week of 12/08/2013 4. Add CEA to labs from 11/24/2013     LOS: 5 days   SHERRILL, GARY  11/24/2013, 7:33 AM   

## 2013-11-24 NOTE — Progress Notes (Signed)
Patient is alert and oriented, vital signs are stable, patient is on full liquid diet tolerating well, discharge instructions reviewed with patient, patient to follow up with primary MD and oncology within 1 week, prescriptions given and questions and concerns answered, Neta Mends RN 11-24-2013 10:45am

## 2013-11-24 NOTE — Progress Notes (Signed)
3 Days Post-Op  Subjective: Tolerating full liquids.  Objective: Vital signs in last 24 hours: Temp:  [97.5 F (36.4 C)-98.4 F (36.9 C)] 98.4 F (36.9 C) (03/16 0844) Pulse Rate:  [89-95] 89 (03/16 0844) Resp:  [16-18] 18 (03/16 0844) BP: (122-158)/(74-89) 122/79 mmHg (03/16 0844) SpO2:  [96 %-99 %] 96 % (03/16 0844) Weight:  [127 lb 3.3 oz (57.7 kg)] 127 lb 3.3 oz (57.7 kg) (03/16 0500) Last BM Date: 11/22/13  Intake/Output from previous day: 03/15 0701 - 03/16 0700 In: 80 [P.O.:80] Out: 1000 [Urine:1000] Intake/Output this shift:    PE: General- In NAD Abdomen-soft, nontender, drain in LUQ  Lab Results:   Recent Labs  11/23/13 0508 11/24/13 0439  WBC 8.8 10.3  HGB 9.8* 10.2*  HCT 30.7* 32.2*  PLT 199 200   BMET  Recent Labs  11/23/13 0508 11/24/13 0439  NA 136* 136*  K 3.4* 3.6*  CL 101 102  CO2 24 24  GLUCOSE 117* 98  BUN 8 6  CREATININE 0.33* 0.32*  CALCIUM 8.8 8.3*   PT/INR No results found for this basename: LABPROT, INR,  in the last 72 hours Comprehensive Metabolic Panel:    Component Value Date/Time   NA 136* 11/24/2013 0439   NA 136* 11/23/2013 0508   NA 136 08/14/2013 1415   NA 136 07/25/2013 1127   NA 136 05/02/2011 0954   K 3.6* 11/24/2013 0439   K 3.4* 11/23/2013 0508   K 4.0 08/14/2013 1415   K 4.1 07/25/2013 1127   K 4.5 05/02/2011 0954   CL 102 11/24/2013 0439   CL 101 11/23/2013 0508   CL 99 02/25/2013 1124   CL 101 01/14/2013 0929   CL 93* 05/02/2011 0954   CO2 24 11/24/2013 0439   CO2 24 11/23/2013 0508   CO2 26 08/14/2013 1415   CO2 28 07/25/2013 1127   CO2 29 05/02/2011 0954   BUN 6 11/24/2013 0439   BUN 8 11/23/2013 0508   BUN 8.0 08/14/2013 1415   BUN 10.2 07/25/2013 1127   BUN 12 05/02/2011 0954   CREATININE 0.32* 11/24/2013 0439   CREATININE 0.33* 11/23/2013 0508   CREATININE 0.6 08/14/2013 1415   CREATININE 0.6 07/25/2013 1127   CREATININE 0.5* 05/02/2011 0954   GLUCOSE 98 11/24/2013 0439   GLUCOSE 117* 11/23/2013 0508    GLUCOSE 172* 08/14/2013 1415   GLUCOSE 117 07/25/2013 1127   GLUCOSE 210* 02/25/2013 1124   GLUCOSE 112* 01/14/2013 0929   GLUCOSE 119* 05/02/2011 0954   CALCIUM 8.3* 11/24/2013 0439   CALCIUM 8.8 11/23/2013 0508   CALCIUM 9.2 08/14/2013 1415   CALCIUM 10.0 07/25/2013 1127   CALCIUM 9.3 05/02/2011 0954   AST 35 11/24/2013 0439   AST 38* 11/23/2013 0508   AST 35* 08/14/2013 1415   AST 66* 07/25/2013 1127   AST 31 05/02/2011 0954   ALT 19 11/24/2013 0439   ALT 19 11/23/2013 0508   ALT 16 08/14/2013 1415   ALT 27 07/25/2013 1127   ALT 26 05/02/2011 0954   ALKPHOS 225* 11/24/2013 0439   ALKPHOS 230* 11/23/2013 0508   ALKPHOS 349* 08/14/2013 1415   ALKPHOS 293* 07/25/2013 1127   ALKPHOS 78 05/02/2011 0954   BILITOT 3.5* 11/24/2013 0439   BILITOT 3.4* 11/23/2013 0508   BILITOT 2.10* 08/14/2013 1415   BILITOT 5.90* 07/25/2013 1127   BILITOT 0.40 05/02/2011 0954   PROT 7.0 11/24/2013 0439   PROT 7.4 11/23/2013 0508   PROT 7.1 08/14/2013 1415  PROT 7.8 07/25/2013 1127   PROT 8.1 05/02/2011 0954   ALBUMIN 1.8* 11/24/2013 0439   ALBUMIN 2.0* 11/23/2013 0508   ALBUMIN 1.9* 08/14/2013 1415   ALBUMIN 2.2* 07/25/2013 1127     Studies/Results: Dg Ugi W/high Density W/kub  11/22/2013   CLINICAL DATA:  Proximal duodenal stricture. Evaluate anatomy prior to possible duodenal stenting.  EXAM: UPPER GI SERIES W/HIGH DENSITY W/KUB  TECHNIQUE: After obtaining a scout radiograph a routine upper GI series was performed using thin density barium.  FLUOROSCOPY TIME:  3 min 6 seconds  COMPARISON:  Abdominal CT 11/20/2013  FINDINGS: Scout image shows a metallic biliary stent in unchanged position. There is an internalized biliary catheter from the left, also unchanged. The bowel gas pattern is nonspecific, but non obstructive. The moderately gas-filled stomach is deviated to the left secondary to scoliosis (per previous CT).  Limited fluoroscopic imaging was performed due to the clinical circumstances, specific clinical question,  and recent endoscopy. The elongated appearing stomach contained minimal retained fluid. The gastric folds appeared thickened, but this was likely a function of incomplete distention. With gravity, contrast readily flowed through the duodenum, which is upwardly retracted and distorted in an area of fixed narrowing with irregular mucosal surface. The area of narrowing is approximately 4 cm in length and the minimal luminal diameter is roughly 9 mm. Narrowing is centered at the level of the ampulla, as localized by the biliary stent. There is distension of the second and third portions of the duodenum secondary to impression by the SMA, as confirmed on preceding abdominal CT. Contrast would flow through this segment in a positionaly dependent manner.  IMPRESSION: Extrinsic, malignant-appearing proximal duodenal stricture - described above. The stricture is currently nonobstructive.   Electronically Signed   By: Jorje Guild M.D.   On: 11/22/2013 13:55    Anti-infectives: Anti-infectives   None      Assessment Principal Problem:   Partial gastric outlet obstruction due to metastatic colon cancer-tolerating a full liquid diet.   Protein-calorie malnutrition, severe    LOS: 5 days   Plan: Would only perform a gastrojejunostomy as a last resort if she does not respond to chemotherapy.  Has a significant chance of poor emptying after the procedure.  Recommend a full liquid diet with supplements to help maintain necessary caloric intake.   Flois Mctague J 11/24/2013

## 2013-11-24 NOTE — Progress Notes (Addendum)
Nutrition Brief Note  Received consult for diet education for full liquid diet. Discussed recommended food choices from all the food groups of full liquid consistency. Handouts provided with RD contact information. Pt and husband expressed understanding. Provided pt with [employer] coupons. Provided pt with [facility] RD contact information, encouraged pt to call with diet questions/nutritional concerns.   Mikey College MS, [place], [place] Pager [phone] After Hours Pager

## 2013-11-24 NOTE — Telephone Encounter (Signed)
Called pt left message for appt on 2/26 per MD

## 2013-11-26 ENCOUNTER — Telehealth: Payer: Self-pay | Admitting: Oncology

## 2013-11-26 NOTE — Telephone Encounter (Signed)
Talked to pt and gave her appt for march and April 2015

## 2013-12-04 ENCOUNTER — Telehealth: Payer: Self-pay | Admitting: Oncology

## 2013-12-04 ENCOUNTER — Ambulatory Visit (HOSPITAL_BASED_OUTPATIENT_CLINIC_OR_DEPARTMENT_OTHER): Payer: Medicare HMO | Admitting: Nurse Practitioner

## 2013-12-04 VITALS — BP 98/67 | HR 87 | Temp 99.5°F | Resp 18 | Ht 60.0 in | Wt 116.5 lb

## 2013-12-04 DIAGNOSIS — M549 Dorsalgia, unspecified: Secondary | ICD-10-CM

## 2013-12-04 DIAGNOSIS — C20 Malignant neoplasm of rectum: Secondary | ICD-10-CM

## 2013-12-04 DIAGNOSIS — C787 Secondary malignant neoplasm of liver and intrahepatic bile duct: Secondary | ICD-10-CM

## 2013-12-04 DIAGNOSIS — R109 Unspecified abdominal pain: Secondary | ICD-10-CM

## 2013-12-04 DIAGNOSIS — K315 Obstruction of duodenum: Secondary | ICD-10-CM

## 2013-12-04 DIAGNOSIS — R634 Abnormal weight loss: Secondary | ICD-10-CM

## 2013-12-04 DIAGNOSIS — C189 Malignant neoplasm of colon, unspecified: Secondary | ICD-10-CM

## 2013-12-04 NOTE — Progress Notes (Addendum)
April Spence OFFICE PROGRESS NOTE   Diagnosis:  Metastatic colon cancer.  INTERVAL HISTORY:   April Spence returns as scheduled. April Spence was hospitalized on 11/19/2013 with nausea/vomiting. April Spence was found to have a duodenal obstruction felt to likely be secondary metastatic colon cancer. April Spence underwent an endoscopy on 11/21/2013 with findings of a 3 cm stricture of the duodenum. The stricture was dilated with passage of the endoscope. 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 The nausea/vomiting improved. April Spence was evaluated by surgery with recommendations to reserve surgery for palliation of intractable nausea/vomiting. April Spence was tolerating a liquid diet at discharge. April Spence was discharged home on 11/24/2013.  April Spence has had a few episodes of nausea/vomiting but overall is tolerating the liquid diet well. April Spence continues to have intermittent right and left-sided abdominal pain. April Spence has not had a bowel movement for approximately 2 weeks. April Spence has started a stool softener. April Spence has not initiated a laxative. April Spence notes decreased output from the biliary drain.  April Spence denies any neuropathy symptoms.  Objective:  Vital signs in last 24 hours:  Blood pressure 98/67, pulse 87, temperature 99.5 F (37.5 C), temperature source Oral, resp. rate 18, height 5' (1.524 m), weight 116 lb 8 oz (52.844 kg), SpO2 97.00%.    HEENT: Mild scleral icterus. No thrush or ulcerations. Resp:  Lungs clear. Cardio: Regular cardiac rhythm. GI: Abdomen soft and nontender. No hepatomegaly. Left abdomen biliary drain. Vascular: Trace lower leg edema bilaterally.  Portacath/PICC-without erythema.  Lab Results:  Lab Results  Component Value Date   WBC 10.3 11/24/2013   HGB 10.2* 11/24/2013   HCT 32.2* 11/24/2013   MCV 93.1 11/24/2013   PLT 200 6/57/9038   NEUTROABS DUPLICATE SEE ACCESSION [id number] 11/20/2013   NEUTROABS 8.7* 11/20/2013    Lab Results  Component Value  Date   NA 136* 11/24/2013    Lab Results  Component Value Date   CEA 11.2* 10/28/2013    Imaging:  No results found.  Medications: I have reviewed the patient's current medications.  Assessment/Plan: 1. 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 April Spence completed 12 cycles of FOLFOX chemotherapy. Oxaliplatin was held with cycles number 5, 8, 11, and 12. 2. Metastatic colon cancer confirmed on a restaging CT evaluation 05/02/2011 with liver metastases and periportal lymphadenopathy. April Spence began treatment with FOLFIRI on 06/01/2011. Avastin was added beginning with cycle number 2. April Spence completed cycle #6 08/08/2011. Restaging CT evaluation 08/15/2011 showed improvement in the liver metastases and porta hepatis lymphadenopathy. April Spence completed cycle #12 FOLFIRI/Avastin 11/07/2011. Restaging CT evaluation 11/24/2011 showed decrease in size of hepatic metastases and no evidence of disease progression or new metastasis. April Spence completed cycle 18 on 03/11/2012. A restaging CT 03/29/2012 revealed stable disease. Irinotecan was discontinued and treatment continued with 5-FU/leucovorin and Avastin on a 3 week schedule beginning 04/02/2012. -restaging CT 08/15/2012 with slight enlargement of the central liver lesion and progression of intrahepatic biliary dilatation, no other liver lesions and no other evidence of progressive metastatic disease. CEA stable on 08/20/2012. Treatment continued with 5-FU and Avastin on a 3 week schedule.restaging CT 09/30/2012 found the dominant central right liver metastasis to be slightly larger with progressive intrahepatic biliary dilatation, a previously described left hepatic lobe lesion appeared smaller measuring 10 mm and appeared separate from the liver and is felt to most likely represent a porta hepatis lymph  node.A. Suspected 9 mm metastasis was noted in the medial  left hepatic lobe on an MRI 10/10/2012 -status post stereotactic radiosurgeryto the dominant liver metastasis in 3 fractions February 10 through 10/28/2012. 5-FU/Avastin was resumed on a 3 week schedule 12/03/2012. CEA in normal range on 12/03/2012 and 01/14/2013. Restaging MRI of the liver 02/21/2013 with a decrease in the dominant right hepatic lesion, stable medial left hepatic lobe lesion-but the left lesion is now causing obstruction of the left hepatic bile ducts. CT 06/23/2013 with a decrease in the right liver lesion, relief of the obstructed left biliary system, and no evidence of progressive metastatic disease.  PET scan 08/12/2013 with a large area of hypermetabolism in the central aspect of the liver compatible with residual metastasis. No other definite signs of metastatic disease. Diffusely increased activity in the thyroid gland. 3. Status post Port-A-Cath placement and Port-A-Cath removal by Dr. Johney Maine. A new Port-A-Cath was placed on 05/16/2011. The Port-A-Cath was removed due to malposition. A new Port-A-Cath was placed on 05/30/2011. 4. Spina bifida with severe scoliosis.  5. Chronic bilateral foot drop/lower leg and foot numbness. 6. Hypothyroidism. 7. History of chemotherapy-induced diarrhea. The bolus and infusional 5-FU were reduced by 25% beginning with cycle number 2 of FOLFOX. The 5-FU dose was decreased by an additional 10% beginning with cycle number 8. 8. History of delayed nausea following chemotherapy. 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 The oxaliplatin was dose reduced by 20% beginning with cycle number 6. The neuropathy symptoms resolved. 10. History of neutropenia secondary to chemotherapy. 11. Status post a colonoscopy 05/05/2010. There was a previous right colectomy anastomosis and slight nodularity. Biopsies were taken which showed benign colonic mucosa with hyperplastic epithelial changes. No significant inflammation, granuloma, or  adenomatous epithelium was identified. 12. Diarrhea following FOLFIRI chemotherapy. Overall the diarrhea is controlled with Lomotil.  13. History of a fungal rash in the groin, improved with Diflucan and nystatin powder. 14. Mouth ulcers, oral candidiasis following cycle #6 of FOLFIRI-the ulcers resolved following treatment with Diflucan. April Spence continues to note mild mouth ulcers following each treatment. April Spence developed mucositis during infusional 5-fluorouracil/concurrent radiation given beginning on 04/07/2013. 15. Nausea during chemotherapy-Zofran was added to the chemotherapy regimen beginning on 07/09/2012. 66. Oozing at the gumline secondary to periodontal disease and Avastin. 17. Jaundice secondary to biliary obstruction status post placement of a metallic biliary stent on 02/27/2013 without improvement in LFTs. Status post placement of a percutaneous biliary drain by interventional radiology on 03/13/2013. April Spence underwent a cholangiogram on 03/21/2013 with evidence of a high-grade obstruction at the level of the previously placed metallic biliary stent. The catheter could not be advanced. The external drainage catheter was upsized. Palliative radiation and infusional 5-FU given 04/07/2013 through 04/24/2013 in an attempt to shrink the liver metastasis so the biliary catheters can be advanced. Internalization of biliary drain on 04/30/2013; new external biliary drain in place 06/09/2013, biliary drain exchange 07/21/2013, 07/29/2013, 09/08/2013, 11/14/2013. 18. Mucositis secondary chemotherapy. Resolved. 19. Abdominal/back pain. Etiology unclear. Improved pain control with increased dose of MS Contin. 20. Superficial pressure ulcer left lower buttock/upper thigh. Followed by home care nursing. 21. Anorexia/weight loss. 22. Poor dentition. 23. Duodenal obstruction-likely secondary to metastatic colon cancer, status post an endoscopy 11/21/2013 confirming a 3 cm stricture of the duodenum, stricture  dilated with passage of the endoscope. Biopsy of the duodenum showed chronic minimally active duodenitis consistent with peptic injury.    Disposition: April Spence appears stable. Dr. Benay Spice reviewed the findings from the recent hospitalization  with April Spence. The duodenal obstruction is most likely secondary to progressive metastatic colon cancer. Dr. Benay Spice recommends initiation of salvage chemotherapy on the CAPOX regimen. April Spence is familiar with oxaliplatin as April Spence has received this in the past. We discussed the neurotoxicity associated with oxaliplatin. We discussed potential toxicities associated with Xeloda including mouth sores, nausea, diarrhea, hand-foot syndrome, skin hyperpigmentation. April Spence would like to proceed.  April Spence will return to begin cycle 1 on 01/08/2014. We will see April in followup prior to cycle 2 on 01/06/2014.  April Spence is scheduled to meet with the dietitian on 12/09/2013. We will obtain baseline labs at that time.   Patient seen with Dr. Benay Spice. 25 minutes were spent face-to-face at today's visit with the majority of that time involving counseling/coordination of care.    Ned Card ANP/GNP-BC   12/04/2013  5:07 PM  This was a shared visit with Ned Card. April Spence is tolerating a liquid diet. I discussed the case with Dr. Ardis Hughs. He does not recommend placement of a duodenal stent if we are planing chemotherapy. I discussed treatment options with April Spence and April Spence. The plan is to begin a trial of CAPOX. We reviewed the potential toxicities associated with this regimen and April Spence agrees to proceed.  Julieanne Manson, M.D.

## 2013-12-04 NOTE — Telephone Encounter (Signed)
gv adn printed appt sched and avs for pt for March adn april....sed added tx....emailed MW to add tx.

## 2013-12-05 ENCOUNTER — Telehealth: Payer: Self-pay | Admitting: *Deleted

## 2013-12-05 ENCOUNTER — Other Ambulatory Visit: Payer: Self-pay | Admitting: Internal Medicine

## 2013-12-05 NOTE — Telephone Encounter (Signed)
Per staff message and POF I have scheduled appts.  JMW  

## 2013-12-08 ENCOUNTER — Encounter: Payer: Self-pay | Admitting: Oncology

## 2013-12-08 ENCOUNTER — Other Ambulatory Visit: Payer: Self-pay | Admitting: *Deleted

## 2013-12-08 ENCOUNTER — Other Ambulatory Visit (HOSPITAL_COMMUNITY): Payer: Medicare HMO

## 2013-12-08 MED ORDER — MORPHINE SULFATE ER 30 MG PO TBCR: 30.0000 mg | EXTENDED_RELEASE_TABLET | Freq: Two times a day (BID) | ORAL | Status: AC

## 2013-12-08 MED ORDER — OXYCODONE-ACETAMINOPHEN 5-325 MG PO TABS: 2.0000 | ORAL_TABLET | ORAL | Status: AC | PRN

## 2013-12-08 MED ORDER — CAPECITABINE 500 MG PO TABS: ORAL_TABLET | ORAL | Status: AC

## 2013-12-08 NOTE — Telephone Encounter (Signed)
Pt called requesting MS Contin and Percocet re-fill; per re-fill in Epic informed pt that MS Contin was re-filled 11/24/13 and therefore not due to be re-filled yet.  Pt stated that she did not use the scripts from the hospital on 3/16 "I still have them because I knew the scripts wouldn't be due yet but the hospital wanted to go ahead and give them to me" Dr. Benay Spice made aware.

## 2013-12-08 NOTE — Progress Notes (Signed)
Faxed xeloda prescription to [facility]

## 2013-12-09 ENCOUNTER — Other Ambulatory Visit (HOSPITAL_COMMUNITY): Payer: Medicare HMO

## 2013-12-09 ENCOUNTER — Other Ambulatory Visit (HOSPITAL_BASED_OUTPATIENT_CLINIC_OR_DEPARTMENT_OTHER): Payer: Medicare HMO

## 2013-12-09 ENCOUNTER — Other Ambulatory Visit (HOSPITAL_COMMUNITY): Payer: Self-pay | Admitting: Radiology

## 2013-12-09 ENCOUNTER — Other Ambulatory Visit: Payer: Medicare HMO

## 2013-12-09 ENCOUNTER — Encounter: Payer: Self-pay | Admitting: Oncology

## 2013-12-09 ENCOUNTER — Ambulatory Visit: Payer: Medicare HMO | Admitting: Nutrition

## 2013-12-09 ENCOUNTER — Ambulatory Visit (HOSPITAL_COMMUNITY): Payer: Medicare HMO

## 2013-12-09 ENCOUNTER — Ambulatory Visit (HOSPITAL_COMMUNITY)
Admission: RE | Admit: 2013-12-09 | Discharge: 2013-12-09 | Disposition: A | Discharge status: Home / Self Care | Payer: Medicare HMO | Source: Ambulatory Visit | Attending: Radiology | Admitting: Radiology

## 2013-12-09 ENCOUNTER — Ambulatory Visit: Payer: Medicare HMO | Admitting: Oncology

## 2013-12-09 DIAGNOSIS — R109 Unspecified abdominal pain: Secondary | ICD-10-CM

## 2013-12-09 DIAGNOSIS — C787 Secondary malignant neoplasm of liver and intrahepatic bile duct: Secondary | ICD-10-CM

## 2013-12-09 DIAGNOSIS — K831 Obstruction of bile duct: Secondary | ICD-10-CM

## 2013-12-09 DIAGNOSIS — C20 Malignant neoplasm of rectum: Secondary | ICD-10-CM

## 2013-12-09 DIAGNOSIS — C189 Malignant neoplasm of colon, unspecified: Secondary | ICD-10-CM

## 2013-12-09 LAB — COMPREHENSIVE METABOLIC PANEL (CC13)
ALK PHOS: 263 U/L — AB (ref 40–150)
ALT: 19 U/L (ref 0–55)
AST: 45 U/L — AB (ref 5–34)
Albumin: 2 g/dL — ABNORMAL LOW (ref 3.5–5.0)
Anion Gap: 9 mEq/L (ref 3–11)
BILIRUBIN TOTAL: 3.53 mg/dL — AB (ref 0.20–1.20)
BUN: 11.2 mg/dL (ref 7.0–26.0)
CO2: 24 mEq/L (ref 22–29)
Calcium: 9.5 mg/dL (ref 8.4–10.4)
Chloride: 99 mEq/L (ref 98–109)
Creatinine: 0.6 mg/dL (ref 0.6–1.1)
Glucose: 212 mg/dl — ABNORMAL HIGH (ref 70–140)
Potassium: 3.9 mEq/L (ref 3.5–5.1)
Sodium: 132 mEq/L — ABNORMAL LOW (ref 136–145)
Total Protein: 8.4 g/dL — ABNORMAL HIGH (ref 6.4–8.3)

## 2013-12-09 LAB — CBC WITH DIFFERENTIAL/PLATELET
BASO%: 0.1 % (ref 0.0–2.0)
BASOS ABS: 0 10*3/uL (ref 0.0–0.1)
EOS%: 1.1 % (ref 0.0–7.0)
Eosinophils Absolute: 0.2 10*3/uL (ref 0.0–0.5)
HCT: 30.7 % — ABNORMAL LOW (ref 34.8–46.6)
HEMOGLOBIN: 10 g/dL — AB (ref 11.6–15.9)
LYMPH#: 0.6 10*3/uL — AB (ref 0.9–3.3)
LYMPH%: 4.6 % — ABNORMAL LOW (ref 14.0–49.7)
MCH: 29.2 pg (ref 25.1–34.0)
MCHC: 32.6 g/dL (ref 31.5–36.0)
MCV: 89.8 fL (ref 79.5–101.0)
MONO#: 1.6 10*3/uL — ABNORMAL HIGH (ref 0.1–0.9)
MONO%: 11.7 % (ref 0.0–14.0)
NEUT#: 11.1 10*3/uL — ABNORMAL HIGH (ref 1.5–6.5)
NEUT%: 82.5 % — ABNORMAL HIGH (ref 38.4–76.8)
Platelets: 221 10*3/uL (ref 145–400)
RBC: 3.42 10*6/uL — ABNORMAL LOW (ref 3.70–5.45)
RDW: 17.5 % — AB (ref 11.2–14.5)
WBC: 13.4 10*3/uL — ABNORMAL HIGH (ref 3.9–10.3)

## 2013-12-09 NOTE — Progress Notes (Addendum)
Patient ID: April Spence, female   DOB: 1955-10-07, 58 y.o.   MRN: [id number]                                                                          Pt presented to IR dept today for evaluation of biliary drain secondary to air in bag and broken suture. On exam, drain is intact, insertion site is ok, suture is wrapped around drain but not in skin. Drain was irrigated with sterile NS without difficulty. Air and bile present in bag and not unusual to see. Will plan to place pursestring suture at drain insertion site. Case d/w Dr. Kathlene Cote and no further imaging necessary.Pt will return to IR dept on 4/17 for routine drain exchange.

## 2013-12-09 NOTE — Procedures (Signed)
Pt presented to IR dept today for evaluation of biliary drain secondary to air in bag and broken suture. On exam, drain is intact, insertion site is ok, suture is wrapped around drain but not in skin. Drain was irrigated with sterile NS without difficulty. Air and bile present in bag and not unusual to see. Will plan to place pursestring suture at drain insertion site. Case d/w Dr. Kathlene Cote and no further imaging necessary.Pt will return to IR dept on 4/17 for routine drain exchange.

## 2013-12-09 NOTE — Progress Notes (Signed)
58 year old female diagnosed with metastatic colon cancer and gas.  Outlet obstruction.  She is a patient of Dr. Benay Spice.  Past medical history includes hypothyroidism, Hypertension, anemia, of chronic disease, protein calorie malnutrition,spinal bifida,  mitral valve prolapse, depression, and radiation therapy.  Medications include Magic mouthwash, vitamin D3, Lomotil, Colace, Prozac, Synthroid, Ativan, Megace, Zofran, and Compazine.  Labs include sodium of 136, potassium 3.6, creatinine 0.32 on March 16.  Height: 60 inches. Weight: 116.5 pounds March 26. Usual body weight: 140 pounds. BMI: 22.75.  Estimated nutrition needs: 1750-1950 calories, 87-97 g protein, 2 liters fluid.  Patient reports she has been on a full liquid diet and drinks boost high-protein 3-4 times a day.  She states her superficial pressure ulcer has healed.  She is having loose stools.  Dietary recall reveals patient consuming Jell-O and yogurt and boost for the most part.  Nutrition diagnosis: Food and nutrition related knowledge deficit related to diagnosis of gastric outlet obstruction.  As evidenced by no prior need for nutrition information.  Intervention: I educated patient on expanding full liquid diet to include appropriate cream soups and pudding.  Recommended patient increase Boost to Boost Plus and consume 4 times a day.  Provided sample menu for patient to follow.  Questions were answered.  Teach back method used.  Fact sheets were given.  Monitoring, evaluation, goals: Patient will tolerate a liquid diet to minimize further weight loss and consume 100% estimated needs.  Next visit: Tuesday, April 7, during chemotherapy.

## 2013-12-10 ENCOUNTER — Telehealth: Payer: Self-pay | Admitting: *Deleted

## 2013-12-10 LAB — CEA: CEA: 39.6 ng/mL — AB (ref 0.0–5.0)

## 2013-12-10 NOTE — Telephone Encounter (Signed)
Patient called. States she was in the hospital with blocked intestines and is on full liquid diet.  Patient asked if OK to restart Vitamin D at 10000 iu.  OK per Dr Melford Aase.  Patient aware.

## 2013-12-11 ENCOUNTER — Other Ambulatory Visit: Payer: Self-pay | Admitting: *Deleted

## 2013-12-11 MED ORDER — DIPHENOXYLATE-ATROPINE 2.5-0.025 MG PO TABS: 1.0000 | ORAL_TABLET | Freq: Four times a day (QID) | ORAL | Status: AC | PRN

## 2013-12-14 ENCOUNTER — Other Ambulatory Visit: Payer: Self-pay | Admitting: Oncology

## 2013-12-15 ENCOUNTER — Other Ambulatory Visit: Payer: Self-pay

## 2013-12-16 ENCOUNTER — Ambulatory Visit: Payer: Medicare HMO

## 2013-12-16 ENCOUNTER — Inpatient Hospital Stay (HOSPITAL_COMMUNITY): Payer: Medicare HMO

## 2013-12-16 ENCOUNTER — Encounter: Payer: Medicare HMO | Admitting: Nutrition

## 2013-12-16 ENCOUNTER — Other Ambulatory Visit: Payer: Medicare HMO

## 2013-12-16 ENCOUNTER — Encounter (HOSPITAL_COMMUNITY): Payer: Self-pay | Admitting: Emergency Medicine

## 2013-12-16 ENCOUNTER — Inpatient Hospital Stay (HOSPITAL_COMMUNITY)
Admission: EM | Admit: 2013-12-16 | Discharge: 2014-01-09 | DRG: 444 | Disposition: E | Payer: Medicare HMO | Attending: Internal Medicine | Admitting: Internal Medicine

## 2013-12-16 ENCOUNTER — Emergency Department (HOSPITAL_COMMUNITY): Payer: Medicare HMO

## 2013-12-16 DIAGNOSIS — R109 Unspecified abdominal pain: Secondary | ICD-10-CM

## 2013-12-16 DIAGNOSIS — R6521 Severe sepsis with septic shock: Secondary | ICD-10-CM

## 2013-12-16 DIAGNOSIS — M216X9 Other acquired deformities of unspecified foot: Secondary | ICD-10-CM | POA: Diagnosis present

## 2013-12-16 DIAGNOSIS — E785 Hyperlipidemia, unspecified: Secondary | ICD-10-CM | POA: Diagnosis present

## 2013-12-16 DIAGNOSIS — E43 Unspecified severe protein-calorie malnutrition: Secondary | ICD-10-CM | POA: Diagnosis present

## 2013-12-16 DIAGNOSIS — Z85038 Personal history of other malignant neoplasm of large intestine: Secondary | ICD-10-CM

## 2013-12-16 DIAGNOSIS — F329 Major depressive disorder, single episode, unspecified: Secondary | ICD-10-CM | POA: Diagnosis present

## 2013-12-16 DIAGNOSIS — D72829 Elevated white blood cell count, unspecified: Secondary | ICD-10-CM | POA: Diagnosis present

## 2013-12-16 DIAGNOSIS — Z87891 Personal history of nicotine dependence: Secondary | ICD-10-CM

## 2013-12-16 DIAGNOSIS — K311 Adult hypertrophic pyloric stenosis: Secondary | ICD-10-CM | POA: Diagnosis present

## 2013-12-16 DIAGNOSIS — Z66 Do not resuscitate: Secondary | ICD-10-CM | POA: Diagnosis not present

## 2013-12-16 DIAGNOSIS — K560 Paralytic ileus: Secondary | ICD-10-CM | POA: Diagnosis present

## 2013-12-16 DIAGNOSIS — K56609 Unspecified intestinal obstruction, unspecified as to partial versus complete obstruction: Secondary | ICD-10-CM | POA: Diagnosis present

## 2013-12-16 DIAGNOSIS — J189 Pneumonia, unspecified organism: Secondary | ICD-10-CM

## 2013-12-16 DIAGNOSIS — R17 Unspecified jaundice: Secondary | ICD-10-CM | POA: Diagnosis present

## 2013-12-16 DIAGNOSIS — Z515 Encounter for palliative care: Secondary | ICD-10-CM

## 2013-12-16 DIAGNOSIS — N179 Acute kidney failure, unspecified: Secondary | ICD-10-CM | POA: Diagnosis not present

## 2013-12-16 DIAGNOSIS — I059 Rheumatic mitral valve disease, unspecified: Secondary | ICD-10-CM | POA: Diagnosis present

## 2013-12-16 DIAGNOSIS — K2980 Duodenitis without bleeding: Secondary | ICD-10-CM | POA: Diagnosis present

## 2013-12-16 DIAGNOSIS — Z79899 Other long term (current) drug therapy: Secondary | ICD-10-CM

## 2013-12-16 DIAGNOSIS — C787 Secondary malignant neoplasm of liver and intrahepatic bile duct: Secondary | ICD-10-CM | POA: Diagnosis present

## 2013-12-16 DIAGNOSIS — M4120 Other idiopathic scoliosis, site unspecified: Secondary | ICD-10-CM | POA: Diagnosis present

## 2013-12-16 DIAGNOSIS — G9340 Encephalopathy, unspecified: Secondary | ICD-10-CM | POA: Diagnosis not present

## 2013-12-16 DIAGNOSIS — R1110 Vomiting, unspecified: Secondary | ICD-10-CM | POA: Diagnosis present

## 2013-12-16 DIAGNOSIS — Z923 Personal history of irradiation: Secondary | ICD-10-CM

## 2013-12-16 DIAGNOSIS — J9690 Respiratory failure, unspecified, unspecified whether with hypoxia or hypercapnia: Secondary | ICD-10-CM | POA: Diagnosis present

## 2013-12-16 DIAGNOSIS — IMO0002 Reserved for concepts with insufficient information to code with codable children: Secondary | ICD-10-CM

## 2013-12-16 DIAGNOSIS — C189 Malignant neoplasm of colon, unspecified: Secondary | ICD-10-CM

## 2013-12-16 DIAGNOSIS — J9600 Acute respiratory failure, unspecified whether with hypoxia or hypercapnia: Secondary | ICD-10-CM | POA: Diagnosis present

## 2013-12-16 DIAGNOSIS — R112 Nausea with vomiting, unspecified: Secondary | ICD-10-CM

## 2013-12-16 DIAGNOSIS — K7200 Acute and subacute hepatic failure without coma: Secondary | ICD-10-CM | POA: Diagnosis present

## 2013-12-16 DIAGNOSIS — R6520 Severe sepsis without septic shock: Secondary | ICD-10-CM

## 2013-12-16 DIAGNOSIS — F3289 Other specified depressive episodes: Secondary | ICD-10-CM | POA: Diagnosis present

## 2013-12-16 DIAGNOSIS — D630 Anemia in neoplastic disease: Secondary | ICD-10-CM | POA: Diagnosis present

## 2013-12-16 DIAGNOSIS — D638 Anemia in other chronic diseases classified elsewhere: Secondary | ICD-10-CM

## 2013-12-16 DIAGNOSIS — Q059 Spina bifida, unspecified: Secondary | ICD-10-CM

## 2013-12-16 DIAGNOSIS — Z9221 Personal history of antineoplastic chemotherapy: Secondary | ICD-10-CM

## 2013-12-16 DIAGNOSIS — C180 Malignant neoplasm of cecum: Secondary | ICD-10-CM | POA: Diagnosis present

## 2013-12-16 DIAGNOSIS — A419 Sepsis, unspecified organism: Secondary | ICD-10-CM | POA: Diagnosis present

## 2013-12-16 DIAGNOSIS — K831 Obstruction of bile duct: Principal | ICD-10-CM | POA: Diagnosis present

## 2013-12-16 DIAGNOSIS — E876 Hypokalemia: Secondary | ICD-10-CM | POA: Diagnosis present

## 2013-12-16 DIAGNOSIS — I10 Essential (primary) hypertension: Secondary | ICD-10-CM | POA: Diagnosis present

## 2013-12-16 DIAGNOSIS — E039 Hypothyroidism, unspecified: Secondary | ICD-10-CM | POA: Diagnosis present

## 2013-12-16 DIAGNOSIS — K8309 Other cholangitis: Secondary | ICD-10-CM | POA: Diagnosis present

## 2013-12-16 LAB — URINALYSIS, ROUTINE W REFLEX MICROSCOPIC
GLUCOSE, UA: NEGATIVE mg/dL
Glucose, UA: NEGATIVE mg/dL
Hgb urine dipstick: NEGATIVE
Hgb urine dipstick: NEGATIVE
KETONES UR: NEGATIVE mg/dL
KETONES UR: NEGATIVE mg/dL
NITRITE: NEGATIVE
NITRITE: NEGATIVE
PROTEIN: 30 mg/dL — AB
Protein, ur: 30 mg/dL — AB
Specific Gravity, Urine: 1.024 (ref 1.005–1.030)
Specific Gravity, Urine: 1.028 (ref 1.005–1.030)
Urobilinogen, UA: 1 mg/dL (ref 0.0–1.0)
Urobilinogen, UA: 1 mg/dL (ref 0.0–1.0)
pH: 8 (ref 5.0–8.0)
pH: 6.5 (ref 5.0–8.0)

## 2013-12-16 LAB — COMPREHENSIVE METABOLIC PANEL
ALK PHOS: 258 U/L — AB (ref 39–117)
ALT: 31 U/L (ref 0–35)
ALT: 66 U/L — AB (ref 0–35)
AST: 84 U/L — AB (ref 0–37)
AST: 230 U/L — AB (ref 0–37)
Albumin: 2.2 g/dL — ABNORMAL LOW (ref 3.5–5.2)
Albumin: 2.2 g/dL — ABNORMAL LOW (ref 3.5–5.2)
Alkaline Phosphatase: 277 U/L — ABNORMAL HIGH (ref 39–117)
BILIRUBIN TOTAL: 5.9 mg/dL — AB (ref 0.3–1.2)
BUN: 13 mg/dL (ref 6–23)
BUN: 15 mg/dL (ref 6–23)
CALCIUM: 10.2 mg/dL (ref 8.4–10.5)
CHLORIDE: 94 meq/L — AB (ref 96–112)
CO2: 30 meq/L (ref 19–32)
CO2: 33 meq/L — AB (ref 19–32)
CREATININE: 0.31 mg/dL — AB (ref 0.50–1.10)
Calcium: 9.7 mg/dL (ref 8.4–10.5)
Chloride: 91 mEq/L — ABNORMAL LOW (ref 96–112)
Creatinine, Ser: 0.36 mg/dL — ABNORMAL LOW (ref 0.50–1.10)
GFR calc Af Amer: >90 mL/min (ref >90)
GFR calc Af Amer: >90 mL/min (ref >90)
GFR calc non Af Amer: >90 mL/min (ref >90)
Glucose, Bld: 144 mg/dL — ABNORMAL HIGH (ref 70–99)
Glucose, Bld: 130 mg/dL — ABNORMAL HIGH (ref 70–99)
POTASSIUM: 3.9 meq/L (ref 3.7–5.3)
Potassium: 3.1 mEq/L — ABNORMAL LOW (ref 3.7–5.3)
SODIUM: 145 meq/L (ref 137–147)
Sodium: 140 mEq/L (ref 137–147)
Total Bilirubin: 5.7 mg/dL — ABNORMAL HIGH (ref 0.3–1.2)
Total Protein: 9.1 g/dL — ABNORMAL HIGH (ref 6.0–8.3)
Total Protein: 8.9 g/dL — ABNORMAL HIGH (ref 6.0–8.3)

## 2013-12-16 LAB — CBC WITH DIFFERENTIAL/PLATELET
Basophils Absolute: 0 10*3/uL (ref 0.0–0.1)
Basophils Relative: 0 % (ref 0–1)
Eosinophils Absolute: 0 10*3/uL (ref 0.0–0.7)
Eosinophils Relative: 0 % (ref 0–5)
HCT: 30.4 % — ABNORMAL LOW (ref 36.0–46.0)
Hemoglobin: 10.1 g/dL — ABNORMAL LOW (ref 12.0–15.0)
Lymphocytes Relative: 5 % — ABNORMAL LOW (ref 12–46)
Lymphs Abs: 0.9 10*3/uL (ref 0.7–4.0)
MCH: 28.9 pg (ref 26.0–34.0)
MCHC: 33.2 g/dL (ref 30.0–36.0)
MCV: 86.9 fL (ref 78.0–100.0)
MONOS PCT: 8 % (ref 3–12)
Monocytes Absolute: 1.5 10*3/uL — ABNORMAL HIGH (ref 0.1–1.0)
Neutro Abs: 16.5 10*3/uL — ABNORMAL HIGH (ref 1.7–7.7)
Neutrophils Relative %: 87 % — ABNORMAL HIGH (ref 43–77)
PLATELETS: 413 10*3/uL — AB (ref 150–400)
RBC: 3.5 MIL/uL — AB (ref 3.87–5.11)
RDW: 17.9 % — ABNORMAL HIGH (ref 11.5–15.5)
WBC: 18.9 10*3/uL — AB (ref 4.0–10.5)

## 2013-12-16 LAB — CBC
HCT: 30.8 % — ABNORMAL LOW (ref 36.0–46.0)
HEMOGLOBIN: 10.2 g/dL — AB (ref 12.0–15.0)
MCH: 28.9 pg (ref 26.0–34.0)
MCHC: 33.1 g/dL (ref 30.0–36.0)
MCV: 87.3 fL (ref 78.0–100.0)
PLATELETS: 393 10*3/uL (ref 150–400)
RBC: 3.53 MIL/uL — AB (ref 3.87–5.11)
RDW: 17.9 % — ABNORMAL HIGH (ref 11.5–15.5)
WBC: 19.6 10*3/uL — AB (ref 4.0–10.5)

## 2013-12-16 LAB — PROTIME-INR
INR: 1.91 — ABNORMAL HIGH (ref 0.00–1.49)
PROTHROMBIN TIME: 21.3 s — AB (ref 11.6–15.2)

## 2013-12-16 LAB — URINE MICROSCOPIC-ADD ON

## 2013-12-16 LAB — LIPASE, BLOOD: LIPASE: 45 U/L (ref 11–59)

## 2013-12-16 LAB — GLUCOSE, CAPILLARY
GLUCOSE-CAPILLARY: 133 mg/dL — AB (ref 70–99)
Glucose-Capillary: 145 mg/dL — ABNORMAL HIGH (ref 70–99)

## 2013-12-16 LAB — HEMOGLOBIN A1C
Hgb A1c MFr Bld: 4.7 % (ref <5.7)
Mean Plasma Glucose: 88 mg/dL (ref <117)

## 2013-12-16 LAB — TSH: TSH: 3.15 u[IU]/mL (ref 0.350–4.500)

## 2013-12-16 LAB — MAGNESIUM: Magnesium: 1.8 mg/dL (ref 1.5–2.5)

## 2013-12-16 LAB — APTT: aPTT: 26 seconds (ref 24–37)

## 2013-12-16 LAB — PHOSPHORUS: Phosphorus: 4.6 mg/dL (ref 2.3–4.6)

## 2013-12-16 LAB — MRSA PCR SCREENING: MRSA BY PCR: POSITIVE — AB

## 2013-12-16 MED ORDER — LEVOFLOXACIN IN D5W 750 MG/150ML IV SOLN
750.0000 mg | INTRAVENOUS | Status: DC
  Filled 2013-12-16 (×2): qty 150

## 2013-12-16 MED ORDER — LEVOTHYROXINE SODIUM 150 MCG PO TABS
150.0000 ug | ORAL_TABLET | Freq: Every day | ORAL | Status: DC
  Administered 2013-12-17: 150 ug via ORAL
  Filled 2013-12-16 (×4): qty 1

## 2013-12-16 MED ORDER — LORAZEPAM 1 MG PO TABS
1.0000 mg | ORAL_TABLET | Freq: Once | ORAL | Status: AC
  Administered 2013-12-16: 1 mg via ORAL
  Filled 2013-12-16: qty 1

## 2013-12-16 MED ORDER — ONDANSETRON HCL 4 MG/2ML IJ SOLN
4.0000 mg | Freq: Four times a day (QID) | INTRAMUSCULAR | Status: DC | PRN
  Administered 2013-12-16 – 2013-12-17 (×4): 4 mg via INTRAVENOUS
  Filled 2013-12-16 (×4): qty 2

## 2013-12-16 MED ORDER — MAGIC MOUTHWASH
10.0000 mL | Freq: Four times a day (QID) | ORAL | Status: DC | PRN
  Filled 2013-12-16: qty 10

## 2013-12-16 MED ORDER — CIPROFLOXACIN IN D5W 400 MG/200ML IV SOLN: 400.0000 mg | INTRAVENOUS | Status: DC

## 2013-12-16 MED ORDER — SODIUM CHLORIDE 0.9 % IV BOLUS (SEPSIS)
1000.0000 mL | Freq: Once | INTRAVENOUS | Status: AC
  Administered 2013-12-16: 1000 mL via INTRAVENOUS

## 2013-12-16 MED ORDER — MEGESTROL ACETATE 40 MG/ML PO SUSP
200.0000 mg | Freq: Two times a day (BID) | ORAL | Status: DC
  Administered 2013-12-16 (×2): 200 mg via ORAL
  Administered 2013-12-17: 22:00:00 via ORAL
  Filled 2013-12-16 (×6): qty 5

## 2013-12-16 MED ORDER — MORPHINE SULFATE ER 30 MG PO TBCR
30.0000 mg | EXTENDED_RELEASE_TABLET | Freq: Two times a day (BID) | ORAL | Status: DC
  Administered 2013-12-16 – 2013-12-17 (×4): 30 mg via ORAL
  Filled 2013-12-16 (×5): qty 1

## 2013-12-16 MED ORDER — IPRATROPIUM BROMIDE 0.02 % IN SOLN
0.5000 mg | RESPIRATORY_TRACT | Status: DC | PRN
  Administered 2013-12-16: 0.5 mg via RESPIRATORY_TRACT
  Filled 2013-12-16: qty 2.5

## 2013-12-16 MED ORDER — PROPRANOLOL HCL 80 MG PO TABS
80.0000 mg | ORAL_TABLET | Freq: Every day | ORAL | Status: DC
  Administered 2013-12-16: 80 mg via ORAL
  Filled 2013-12-16 (×3): qty 1

## 2013-12-16 MED ORDER — SODIUM CHLORIDE 0.9 % IJ SOLN
INTRAMUSCULAR | Status: AC
  Administered 2013-12-16: 10 mL
  Filled 2013-12-16: qty 10

## 2013-12-16 MED ORDER — POTASSIUM CHLORIDE 10 MEQ/100ML IV SOLN
10.0000 meq | Freq: Once | INTRAVENOUS | Status: AC
  Administered 2013-12-16: 10 meq via INTRAVENOUS
  Filled 2013-12-16: qty 100

## 2013-12-16 MED ORDER — ONDANSETRON HCL 4 MG/2ML IJ SOLN
4.0000 mg | Freq: Once | INTRAMUSCULAR | Status: AC
  Administered 2013-12-16: 4 mg via INTRAVENOUS
  Filled 2013-12-16: qty 2

## 2013-12-16 MED ORDER — ONDANSETRON HCL 4 MG PO TABS
4.0000 mg | ORAL_TABLET | Freq: Four times a day (QID) | ORAL | Status: DC | PRN
  Administered 2013-12-16: 4 mg via ORAL
  Filled 2013-12-16 (×2): qty 1

## 2013-12-16 MED ORDER — FLUOXETINE HCL 20 MG PO CAPS
20.0000 mg | ORAL_CAPSULE | Freq: Every morning | ORAL | Status: DC
  Administered 2013-12-16 – 2013-12-17 (×2): 20 mg via ORAL
  Filled 2013-12-16 (×3): qty 1

## 2013-12-16 MED ORDER — OXYCODONE-ACETAMINOPHEN 5-325 MG PO TABS
2.0000 | ORAL_TABLET | ORAL | Status: DC | PRN
  Administered 2013-12-16: 2 via ORAL
  Filled 2013-12-16: qty 2

## 2013-12-16 MED ORDER — AMITRIPTYLINE HCL 25 MG PO TABS
50.0000 mg | ORAL_TABLET | Freq: Every evening | ORAL | Status: DC
  Administered 2013-12-16: 75 mg via ORAL
  Administered 2013-12-17: 25 mg via ORAL
  Filled 2013-12-16 (×3): qty 3

## 2013-12-16 MED ORDER — DOCUSATE SODIUM 100 MG PO CAPS
100.0000 mg | ORAL_CAPSULE | Freq: Two times a day (BID) | ORAL | Status: DC
  Administered 2013-12-16: 100 mg via ORAL
  Filled 2013-12-16 (×8): qty 1

## 2013-12-16 MED ORDER — SODIUM CHLORIDE 0.9 % IV SOLN
INTRAVENOUS | Status: DC
  Administered 2013-12-16: 1 mL via INTRAVENOUS
  Administered 2013-12-16: 21:00:00 via INTRAVENOUS
  Administered 2013-12-17: 75 mL/h via INTRAVENOUS
  Administered 2013-12-18 (×2): via INTRAVENOUS

## 2013-12-16 MED ORDER — MORPHINE SULFATE 2 MG/ML IJ SOLN
1.0000 mg | INTRAMUSCULAR | Status: DC | PRN
  Administered 2013-12-16 – 2013-12-17 (×5): 1 mg via INTRAVENOUS
  Filled 2013-12-16 (×5): qty 1

## 2013-12-16 MED ORDER — LEVALBUTEROL HCL 1.25 MG/0.5ML IN NEBU
1.2500 mg | INHALATION_SOLUTION | RESPIRATORY_TRACT | Status: DC | PRN
  Administered 2013-12-16: 1.25 mg via RESPIRATORY_TRACT
  Filled 2013-12-16 (×2): qty 0.5

## 2013-12-16 MED ORDER — CHLORHEXIDINE GLUCONATE 0.12 % MT SOLN
15.0000 mL | Freq: Two times a day (BID) | OROMUCOSAL | Status: DC
  Administered 2013-12-16 – 2013-12-18 (×5): 15 mL via OROMUCOSAL
  Filled 2013-12-16 (×5): qty 15

## 2013-12-16 NOTE — Progress Notes (Signed)
CARE MANAGEMENT NOTE 12/31/2013  Patient:  April Spence, April Spence   Account Number:  [id number]  Date Initiated:  01/06/2014  Documentation initiated by:  DAVIS,RHONDA  Subjective/Objective Assessment:   hx of goo now with 24-48hours of xonstant vomitus, hypotensive-bolused in the ed, elevated bili-juandice is present.     Action/Plan:   home when stable/will follow for dcd needs.   Anticipated DC Date:  12/13/2013   Anticipated DC Plan:  HOME/SELF CARE  In-house referral  NA      DC Planning Services  NA      [facility] Choice  NA   Choice offered to / List presented to:  NA      DME agency  NA     [place] arranged  NA      [place] agency  NA   Status of service:  In process, will continue to follow Medicare Important Message given?  NA - LOS <3 / Initial given by admissions (If response is "NO", the following Medicare IM given date fields will be blank) Date Medicare IM given:   Date Additional Medicare IM given:    Discharge Disposition:    Per UR Regulation:  Reviewed for med. necessity/level of care/duration of stay  If discussed at [place] of Stay Meetings, dates discussed:    Comments:  01/05/2014/Rhonda Eldridge Dace, [place], [place] [phone] Chart Reviewed for discharge and hospital needs. Discharge needs at time of review: None present will follow for needs. Review of patient progress due on [phone].

## 2013-12-16 NOTE — Progress Notes (Signed)
INITIAL NUTRITION ASSESSMENT  Pt meets criteria for severe MALNUTRITION in the context of chronic illness as evidenced by severe muscle wasting and subcutaneous fat loss in clavicles and hands.  DOCUMENTATION CODES Per approved criteria  -Severe malnutrition in the context of chronic illness   INTERVENTION: - Diet advancement per MD - Antiemetics per MD - Will continue to monitor   NUTRITION DIAGNOSIS: Inadequate oral intake related to inability to eat as evidenced by NPO.   Goal: Advance diet as tolerated to full liquid diet  Monitor:  Weights, labs, diet advancement, nausea, vomiting   Reason for Assessment: Malnutrition screening tool   58 y.o. female  Admitting Dx: Vomiting   ASSESSMENT: Pt with nausea that started at 5pm yesterday with constant vomiting that started yellow and became dark. Was following liquid diet at home. Known to RD from previous admission last month and has been seen by [facility] RD. Pt with metastatic colon CA with biliary obstruction s/p drain placement, was supposed to resume chemotherapy today, found to have gastric outlet obstruction.   Pt had c/o poor appetite for 2 months prior to March 2015 admission r/t recent dental surgery, however reports appetite good prior to current admission. Pt has been consuming things like eggs, jello, yogurt, soup, and pudding and drinking 4 Boost Plus/day. Also has protein powder and drink from [facility] for additional nutrition. States she eats 6 meals/day, 3 large, 3 small. On Megace at home. Weight up 2 pounds from the end of last month.    Alk phos, AST, ALT, and total bilirubin elevated   Nutrition Focused Physical Exam:  Subcutaneous Fat:  Orbital Region: severe wasting  Upper Arm Region: mild/moderate wasting  Thoracic and Lumbar Region: NA   Muscle:  Temple Region: mild/moderate wasting  Clavicle Bone Region: severe wasting  Clavicle and Acromion Bone Region: mild/moderate wasting  Scapular  Bone Region: NA  Dorsal Hand: severe wasting  Patellar Region: WNL  Anterior Thigh Region: WNL  Posterior Calf Region: WNL   Edema: None noted    Height: Ht Readings from Last 1 Encounters:  01/06/2014 5' (1.524 m)    Weight: Wt Readings from Last 1 Encounters:  12/13/2013 118 lb 12.8 oz (53.887 kg)    Ideal Body Weight: 100 lbs   % Ideal Body Weight: 118%  Wt Readings from Last 10 Encounters:  01/03/2014 118 lb 12.8 oz (53.887 kg)  12/04/13 116 lb 8 oz (52.844 kg)  11/24/13 127 lb 3.3 oz (57.7 kg)  11/24/13 127 lb 3.3 oz (57.7 kg)  10/28/13 118 lb (53.524 kg)  10/28/13 119 lb 9.6 oz (54.25 kg)  09/17/13 123 lb 14.4 oz (56.201 kg)  09/01/13 125 lb (56.7 kg)  08/14/13 125 lb 8 oz (56.926 kg)  08/06/13 125 lb 8 oz (56.926 kg)    Usual Body Weight: 155 lbs 2-3 years ago  % Usual Body Weight: 76%  BMI:  Body mass index is 23.2 kg/(m^2).  Estimated Nutritional Needs: Kcal: 3536-1443 Protein: 87-97g Fluid: 2L/day  Skin: Intact   Diet Order: NPO  EDUCATION NEEDS: -No education needs identified at this time  No intake or output data in the 24 hours ending 12/15/2013 1333  Last BM: 4/6  Labs:   Recent Labs Lab 12/13/2013 0540 12/17/2013 1135  NA 140 145  K 3.1* 3.9  CL 91* 94*  CO2 30 33*  BUN 13 15  CREATININE 0.31* 0.36*  CALCIUM 10.2 9.7  MG  --  1.8  PHOS  --  4.6  GLUCOSE 144* 130*    CBG (last 3)   Recent Labs  01/05/2014 0938  GLUCAP 145*    Scheduled Meds: . amitriptyline  50-75 mg Oral QPM  . chlorhexidine  15 mL Mouth/Throat BID  . ciprofloxacin  400 mg Intravenous 30 min Pre-Op  . docusate sodium  100 mg Oral BID  . FLUoxetine  20 mg Oral q morning - 10a  . [START ON 12/17/2013] levothyroxine  150 mcg Oral QAC breakfast  . megestrol  200 mg Oral BID  . morphine  30 mg Oral Q12H  . propranolol  80 mg Oral Daily    Continuous Infusions: . sodium chloride 1 mL (12/26/2013 0850)    Past Medical History  Diagnosis Date  . colon ca dx'd  04/2009    chemo comp 11/2009.. colon and liver  . Spina bifida   . Scoliosis   . Hyperlipidemia   . Foot drop     bilateral,wears braces  . Mitral valve prolapse   . Hypothyroidism   . Depression   . History of chemotherapy     5-FU and Avastin 12 cycles FOLFOX  . Colon cancer 05/06/2010    metastatic  . Hepatic metastases 08/15/12    per ct abdomen  . Radiation 10/21/12-10/28/12    Palliative liver mets 54 gray in 3 fx's  . Arrhythmia 02-26-13    "rapid heart rate occ."  . History of radiation therapy 10/21/2012-10/28/2012    54 gray to liver metastasis  . History of radiation therapy 04/07/2013-04/24/2013    45 gray to liver metastasis  . Hypertension 11/19/2013    Past Surgical History  Procedure Laterality Date  . Cholecystectomy    . Back surgery  1980 and 2010  . Knee arthroscopy  2005    left  . Tubal ligation  1973  . Colonoscopy  05/05/2010  . Colon surgery      2010  . Portacath placement  05/30/2011    tip at cavoatrialjjunction by xray  . Ercp N/A 02/27/2013    Procedure: ENDOSCOPIC RETROGRADE CHOLANGIOPANCREATOGRAPHY (ERCP);  Surgeon: Milus Banister, MD;  Location: [facility];  Service: Endoscopy;  Laterality: N/A;  . Biliary stent placement N/A 02/27/2013    Procedure: BILIARY STENT PLACEMENT;  Surgeon: Milus Banister, MD;  Location: [facility]COPY;  Service: Endoscopy;  Laterality: N/A;  . Percutaneous transhepatic cholangiograhpy and billiary drainage  03/21/2013  . Dental work  09/2013  . Esophagogastroduodenoscopy N/A 11/21/2013    Procedure: ESOPHAGOGASTRODUODENOSCOPY (EGD);  Surgeon: Lafayette Dragon, MD;  Location: [facility];  Service: Endoscopy;  Laterality: N/A;    Mikey College MS, [place], [place] Pager [phone] After Hours Pager

## 2013-12-16 NOTE — ED Notes (Signed)
MD at bedside. 

## 2013-12-16 NOTE — ED Provider Notes (Signed)
CSN: [id number]     Arrival date & time 01/02/2014  0423 History   First MD Initiated Contact with Patient 12/22/2013 [phone]     Chief Complaint  Patient presents with  . Emesis     (Consider location/radiation/quality/duration/timing/severity/associated sxs/prior Treatment) HPI History provided by patient. Has metastatic colon cancer and currently is on a clear liquid diet. Today at home developed nausea vomiting, dark emesis without blood. No fevers or chills. Has mild abdominal discomfort. Symptoms moderate in severity. Unable to hold anything down. Patient concerned because she has scheduled chemotherapy this morning. Past Medical History  Diagnosis Date  . colon ca dx'd 04/2009    chemo comp 11/2009.. colon and liver  . Spina bifida   . Scoliosis   . Hyperlipidemia   . Foot drop     bilateral,wears braces  . Mitral valve prolapse   . Hypothyroidism   . Depression   . History of chemotherapy     5-FU and Avastin 12 cycles FOLFOX  . Colon cancer 05/06/2010    metastatic  . Hepatic metastases 08/15/12    per ct abdomen  . Radiation 10/21/12-10/28/12    Palliative liver mets 54 gray in 3 fx's  . Arrhythmia 02-26-13    "rapid heart rate occ."  . History of radiation therapy 10/21/2012-10/28/2012    52 gray to liver metastasis  . History of radiation therapy 04/07/2013-04/24/2013    45 gray to liver metastasis  . Hypertension 11/19/2013   Past Surgical History  Procedure Laterality Date  . Cholecystectomy    . Back surgery  1980 and 2010  . Knee arthroscopy  2005    left  . Tubal ligation  1973  . Colonoscopy  05/05/2010  . Colon surgery      2010  . Portacath placement  05/30/2011    tip at cavoatrialjjunction by xray  . Ercp N/A 02/27/2013    Procedure: ENDOSCOPIC RETROGRADE CHOLANGIOPANCREATOGRAPHY (ERCP);  Surgeon: Milus Banister, MD;  Location: [facility];  Service: Endoscopy;  Laterality: N/A;  . Biliary stent placement N/A 02/27/2013    Procedure: BILIARY STENT PLACEMENT;   Surgeon: Milus Banister, MD;  Location: [facility];  Service: Endoscopy;  Laterality: N/A;  . Percutaneous transhepatic cholangiograhpy and billiary drainage  03/21/2013  . Dental work  09/2013  . Esophagogastroduodenoscopy N/A 11/21/2013    Procedure: ESOPHAGOGASTRODUODENOSCOPY (EGD);  Surgeon: Lafayette Dragon, MD;  Location: [facility];  Service: Endoscopy;  Laterality: N/A;   Family History  Problem Relation Age of Onset  . Cancer Sister     breast  . Cancer Maternal Grandmother     breast  . Cancer Other 42    breast ca   History  Substance Use Topics  . Smoking status: Former Smoker    Quit date: 06/27/2006  . Smokeless tobacco: Never Used  . Alcohol Use: No   OB History   Grav Para Term Preterm Abortions TAB SAB Ect Mult Living                 Review of Systems  Constitutional: Negative for fever and chills.  Respiratory: Negative for shortness of breath.   Cardiovascular: Negative for chest pain.  Gastrointestinal: Positive for nausea, vomiting and abdominal pain.  Genitourinary: Negative for dysuria.  Musculoskeletal: Negative for back pain, neck pain and neck stiffness.  Skin: Negative for rash.  Neurological: Negative for headaches.  All other systems reviewed and are negative.      Allergies  Adhesive and Codeine  Home Medications   Current Outpatient Rx  Name  Route  Sig  Dispense  Refill  . Alum & Mag Hydroxide-Simeth (MAGIC MOUTHWASH) SOLN   Oral   Take 10 mLs by mouth 4 (four) times daily as needed for mouth pain. Mouthsores   15 mL   0   . amitriptyline (ELAVIL) 25 MG tablet   Oral   Take 2-3 tablets (50-75 mg total) by mouth every evening. As needed for chronic back pain   90 tablet   0   . capecitabine (XELODA) 500 MG tablet      Take 2 tablets by mouth (=1000 mg) in AM; Take 2 tablets by mouth (=1000 mg) in PM.  Total daily dose of 2000 mg for 14 days.  Start 12/13/2013   56 tablet   0     Script to [facility] 12/08/13   . chlorhexidine  (PERIDEX) 0.12 % solution   Mouth/Throat   Use as directed 15 mLs in the mouth or throat 2 (two) times daily.   120 mL   0   . Cholecalciferol (VITAMIN D-3) 5000 UNITS TABS   Oral   Take 5,000 Units by mouth 2 (two) times daily.          . diphenoxylate-atropine (LOMOTIL) 2.5-0.025 MG per tablet   Oral   Take 1 tablet by mouth 4 (four) times daily as needed for diarrhea or loose stools.   60 tablet   0   . docusate sodium (COLACE) 100 MG capsule   Oral   Take 1 capsule (100 mg total) by mouth 2 (two) times daily.   30 capsule   0   . FLUoxetine (PROZAC) 20 MG capsule   Oral   Take 1 capsule (20 mg total) by mouth every morning.   30 capsule   3   . levothyroxine (SYNTHROID, LEVOTHROID) 150 MCG tablet   Oral   Take 1 tablet (150 mcg total) by mouth daily before breakfast.   30 tablet   1   . lidocaine-prilocaine (EMLA) cream   Topical   Apply 1 application topically as needed (use on port).         . megestrol (MEGACE ORAL) 40 MG/ML suspension   Oral   Take 5 mLs (200 mg total) by mouth 2 (two) times daily.   240 mL   2   . morphine (MS CONTIN) 30 MG 12 hr tablet   Oral   Take 1 tablet (30 mg total) by mouth every 12 (twelve) hours.   60 tablet   0     *Pt did not get 3/16 script filled from hosptial   . ondansetron (ZOFRAN) 8 MG tablet   Oral   Take 1 tablet (8 mg total) by mouth every 12 (twelve) hours as needed for nausea or vomiting.   30 tablet   0   . oxyCODONE-acetaminophen (PERCOCET/ROXICET) 5-325 MG per tablet   Oral   Take 2 tablets by mouth every 4 (four) hours as needed for moderate pain or severe pain.   30 tablet   0     *Pt did not get 3/16 script from hospital filled   . prochlorperazine (COMPAZINE) 10 MG tablet   Oral   Take 1 tablet (10 mg total) by mouth every 6 (six) hours as needed (nausea).   30 tablet   0   . propranolol (INDERAL) 80 MG tablet   Oral   Take 80 mg by mouth daily.         Marland Kitchen  traMADol-acetaminophen  (ULTRACET) 37.5-325 MG per tablet   Oral   Take 1 tablet by mouth every 6 (six) hours as needed for moderate pain.   30 tablet   0    BP 158/84  Pulse 97  Temp(Src) 98.7 F (37.1 C) (Oral)  Resp 18  SpO2 95% Physical Exam  Constitutional: She is oriented to person, place, and time. She appears well-developed and well-nourished.  HENT:  Head: Normocephalic and atraumatic.  Eyes: EOM are normal. Pupils are equal, round, and reactive to light.  Neck: Neck supple.  Cardiovascular: Normal rate, regular rhythm and intact distal pulses.   Pulmonary/Chest: Effort normal and breath sounds normal. No respiratory distress.  Abdominal: There is no rebound and no guarding.  Mild distention with mild diffuse tenderness. drain in place  Musculoskeletal: Normal range of motion. She exhibits no edema.  Neurological: She is alert and oriented to person, place, and time. No cranial nerve deficit.  Skin: Skin is warm and dry.  Jaundice    ED Course  Procedures (including critical care time) Labs Review Labs Reviewed  CBC - Abnormal; Notable for the following:    WBC 19.6 (*)    RBC 3.53 (*)    Hemoglobin 10.2 (*)    HCT 30.8 (*)    RDW 17.9 (*)    All other components within normal limits  COMPREHENSIVE METABOLIC PANEL - Abnormal; Notable for the following:    Potassium 3.1 (*)    Chloride 91 (*)    Glucose, Bld 144 (*)    Creatinine, Ser 0.31 (*)    Total Protein 9.1 (*)    Albumin 2.2 (*)    AST 84 (*)    Alkaline Phosphatase 277 (*)    Total Bilirubin 5.9 (*)    All other components within normal limits  URINALYSIS, ROUTINE W REFLEX MICROSCOPIC - Abnormal; Notable for the following:    Color, Urine ORANGE (*)    Bilirubin Urine SMALL (*)    Protein, ur 30 (*)    Leukocytes, UA SMALL (*)    All other components within normal limits  URINE MICROSCOPIC-ADD ON - Abnormal; Notable for the following:    Bacteria, UA FEW (*)    Casts HYALINE CASTS (*)    All other components  within normal limits  LIPASE, BLOOD   Imaging Review Dg Abd Acute W/chest  22-Dec-2013   CLINICAL DATA:  Abdominal pain, nausea and vomiting.  EXAM: ACUTE ABDOMEN SERIES (ABDOMEN 2 VIEW & CHEST 1 VIEW)  COMPARISON:  Chest radiograph performed 05/16/2011, and CT of the abdomen and pelvis performed 11/20/2013  FINDINGS: The lungs are hypoexpanded. Hazy right-sided airspace opacity is noted. A new 3.4 cm masslike density is noted at the left midlung zone. Vascular congestion is noted. No definite pleural effusion or pneumothorax is seen. The cardiomediastinal silhouette is within normal limits. A left-sided chest port is noted ending about the cavoatrial junction.  The bowel gas pattern is difficult to assess due to limitations in positioning. Decubitus views demonstrate apparent air-fluid levels within small and large bowel loops, raising concern for either ileus or obstruction. A drainage catheter and stent are noted at the right abdomen. No free intra-abdominal air is identified on the provided decubitus view.  Left convex thoracic and right convex lumbar scoliosis is noted. No acute osseous abnormalities are seen.  IMPRESSION: 1. Apparent air-fluid levels within small and large bowel loops, raising concern for either mild ileus or some degree of small bowel obstruction. No free intra-abdominal  air seen. 2. Hazy right-sided airspace opacity noted; this could reflect mild pneumonia. 3. New 3.4 cm masslike density at the left midlung zone. Malignancy cannot be excluded; this could reflect metastatic disease, given the patient's history of metastatic colon cancer.   Electronically Signed   By: Garald Balding M.D.   On: 12/24/2013 05:52    IV fluids. IV Zofran. IV potassium provided Imaging reviewed an NG tube ordered discussed with Dr. Roel Cluck, will admit Discussed with Dr Zella Richer, he will have Dr Hassell Done evaluate from [place]  MDM   Diagnosis: Gastric outlet obstruction, metastatic colon cancer  Evaluated  with labs and imaging as above. Symptomatically improved with medications provided. IV fluids. MED admit. [facility] to consult.   EMR records reviewed, admitted for the same about a month ago    Teressa Lower, MD 12/23/2013 [phone]

## 2013-12-16 NOTE — Consult Note (Signed)
Subjective: 58 y/o female with PMH metastatic colon cancer with bilary obstruction and liver mets.  She was recently admitted between 11/19/13 - 11/24/13 due to similar complaints.  She presented to [facility] on 12/29/2013 am after developing nausea, vomiting, dark emesis without blood.  C/o fever/chills, RUQ abdominal pain, loose stools, headaches, and SOB since arrival to the ED.  No CP or urinary symptoms. Symptoms were moderate in severity and she hasn't been able to keep anything down.  She was concerned because she was supposed to restart chemo today.  Last time she had chemo was in August 2014.  She is s/p biliary stent placement by Dr. Ardis Hughs in June 2014.  She has a biliary catheter placed by IR in July 2014, last changed 11/14/13.  She was most recently tolerating liquids at discharge last month.  Her husband notes that yesterday afternoon her biliary catheter on in  The LUQ has not been draining and has in fact been leaking around the drain site onto the skin.  She is due to have it replaced next week in IR.    All systems reviewed and otherwise negative except for as above  Colon cancer history dating back to 2010 summarized: 1.  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 She completed 12 cycles of FOLFOX chemotherapy. Oxaliplatin was held with cycles number 5, 8, 11, and 12. Metastatic colon cancer confirmed on a restaging CT evaluation 05/02/2011 with liver metastases and periportal lymphadenopathy. She began treatment with FOLFIRI on 06/01/2011. Avastin was added beginning with cycle number  2. She completed cycle #6 08/08/2011. Restaging CT evaluation 08/15/2011 showed improvement in the liver metastases and porta hepatis lymphadenopathy. She completed cycle #12 FOLFIRI/Avastin 11/07/2011. Restaging CT evaluation 11/24/2011 showed decrease in size of hepatic metastases and no evidence  of disease progression or new metastasis. She completed cycle 18 on 03/11/2012. A restaging CT 03/29/2012 revealed stable disease. Irinotecan was discontinued and treatment continued with 5-FU/leucovorin and Avastin on a 3 week schedule beginning 04/02/2012. -restaging CT 08/15/2012 with slight enlargement of the central liver lesion and progression of intrahepatic biliary dilatation, no other liver lesions and no other evidence of progressive metastatic disease. CEA stable on 08/20/2012. Treatment continued with 5-FU and Avastin on a 3 week schedule.restaging CT 09/30/2012 found the dominant central right liver metastasis to be slightly larger with progressive intrahepatic biliary dilatation, a previously described left hepatic lobe lesion appeared smaller measuring 10 mm and appeared separate from the liver and is felt to most likely represent a porta hepatis lymph node.A. Suspected 9 mm metastasis was noted in the medial left hepatic lobe on an MRI 10/10/2012 -status post stereotactic radiosurgeryto the dominant liver metastasis in 3 fractions February 10 through 10/28/2012. 5-FU/Avastin was resumed on a 3 week schedule 12/03/2012. CEA in normal range on 12/03/2012 and 01/14/2013. Restaging MRI of the liver 02/21/2013 with a decrease in the dominant right hepatic lesion, stable medial left hepatic lobe lesion-but the left lesion is now causing obstruction of the left hepatic bile ducts.   Objective: Vital signs in last 24 hours: Temp:  [98.7 F (37.1 C)] 98.7 F (37.1 C) (04/07 0432) Pulse Rate:  [97] 97 (04/07 0432) Resp:  [18] 18 (04/07 0432) BP: (158)/(84) 158/84 mmHg (04/07 0432) SpO2:  [95 %] 95 % (04/07 0432)    Intake/Output from previous day:   Intake/Output this shift:  PE: General: pleasant, WD/WN white female who is laying in bed in mild respiratory distress HEENT: head is normocephalic, atraumatic.  Sclera are icteric, but not injected.  PERRL.  Ears and nose without any masses  or lesions.  Mouth is pink and moist.  Poor dentition Heart: regular, rate, and rhythm.  Normal s1,s2. No obvious murmurs, gallops, or rubs noted.  Palpable radial and pedal pulses bilaterally Lungs:  Appears in some degree of respiratory distress with RR between 28-44 while in room and some noted retractions, course breath sounds heard b/l, no wheezes, rhonchi, or rales noted.   Abd: soft, ND, tender in the RUQ, but mildly diffusely, +BS, no masses, hernias.  Perc biliary catheter in LUQ with bile staining on the dressing, no bag attached. MS: all 4 extremities are symmetrical with no cyanosis, clubbing, foot drop noted b/l, +2 edema b/l LE to mid shin Skin: warm and dry with no masses, lesions, or rashes, skin is icteric Psych: A&Ox3 with an appropriate affect.   Lab Results:   Recent Labs  12/14/2013 0540  WBC 19.6*  HGB 10.2*  HCT 30.8*  PLT 393   BMET  Recent Labs  01/02/2014 0540  NA 140  K 3.1*  CL 91*  CO2 30  GLUCOSE 144*  BUN 13  CREATININE 0.31*  CALCIUM 10.2   PT/INR No results found for this basename: LABPROT, INR,  in the last 72 hours CMP     Component Value Date/Time   NA 140 01/07/2014 0540   NA 132* 12/09/2013 1141   NA 136 05/02/2011 0954   K 3.1* 01/02/2014 0540   K 3.9 12/09/2013 1141   K 4.5 05/02/2011 0954   CL 91* 12/27/2013 0540   CL 99 02/25/2013 1124   CL 93* 05/02/2011 0954   CO2 30 01/06/2014 0540   CO2 24 12/09/2013 1141   CO2 29 05/02/2011 0954   GLUCOSE 144* 01/01/2014 0540   GLUCOSE 212* 12/09/2013 1141   GLUCOSE 210* 02/25/2013 1124   GLUCOSE 119* 05/02/2011 0954   BUN 13 01/07/2014 0540   BUN 11.2 12/09/2013 1141   BUN 12 05/02/2011 0954   CREATININE 0.31* 12/28/2013 0540   CREATININE 0.6 12/09/2013 1141   CREATININE 0.5* 05/02/2011 0954   CALCIUM 10.2 12/11/2013 0540   CALCIUM 9.5 12/09/2013 1141   CALCIUM 9.3 05/02/2011 0954   PROT 9.1* 12/23/2013 0540   PROT 8.4* 12/09/2013 1141   PROT 8.1 05/02/2011 0954   ALBUMIN 2.2* 12/28/2013 0540   ALBUMIN 2.0*  12/09/2013 1141   AST 84* 12/18/2013 0540   AST 45* 12/09/2013 1141   AST 31 05/02/2011 0954   ALT 31 12/26/2013 0540   ALT 19 12/09/2013 1141   ALT 26 05/02/2011 0954   ALKPHOS 277* 12/31/2013 0540   ALKPHOS 263* 12/09/2013 1141   ALKPHOS 78 05/02/2011 0954   BILITOT 5.9* 12/27/2013 0540   BILITOT 3.53* 12/09/2013 1141   BILITOT 0.40 05/02/2011 0954   GFRNONAA >90 12/26/2013 0540   GFRAA >90 12/10/2013 0540   Lipase     Component Value Date/Time   LIPASE 45 12/30/2013 0540       Studies/Results: Dg Abd Acute W/chest  12/13/2013   CLINICAL DATA:  Abdominal pain, nausea and vomiting.  EXAM: ACUTE ABDOMEN SERIES (ABDOMEN 2 VIEW & CHEST 1 VIEW)  COMPARISON:  Chest radiograph performed 05/16/2011, and CT of the abdomen and pelvis performed 11/20/2013  FINDINGS: The lungs are hypoexpanded. Hazy right-sided airspace opacity is noted. A new 3.4  cm masslike density is noted at the left midlung zone. Vascular congestion is noted. No definite pleural effusion or pneumothorax is seen. The cardiomediastinal silhouette is within normal limits. A left-sided chest port is noted ending about the cavoatrial junction.  The bowel gas pattern is difficult to assess due to limitations in positioning. Decubitus views demonstrate apparent air-fluid levels within small and large bowel loops, raising concern for either ileus or obstruction. A drainage catheter and stent are noted at the right abdomen. No free intra-abdominal air is identified on the provided decubitus view.  Left convex thoracic and right convex lumbar scoliosis is noted. No acute osseous abnormalities are seen.  IMPRESSION: 1. Apparent air-fluid levels within small and large bowel loops, raising concern for either mild ileus or some degree of small bowel obstruction. No free intra-abdominal air seen. 2. Hazy right-sided airspace opacity noted; this could reflect mild pneumonia. 3. New 3.4 cm masslike density at the left midlung zone. Malignancy cannot be excluded; this  could reflect metastatic disease, given the patient's history of metastatic colon cancer.   Electronically Signed   By: Garald Balding M.D.   On: 12/18/2013 05:52    Anti-infectives: Anti-infectives   None       Assessment/Plan 1.  Abdominal pain 2* #3/4/5 2.  Nausea/vomiting 3.  Gastric outlet obstruction 4.  Biliary obstruction with IR placed billiary drain left hepatic duct - 03/13/13  5.  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  Spina Bifida/scoliosis with foot drop 7.  MVP  8.  Depression  9.  Hypertension 10.  Tachycardia 11.  Severe PCM 12.  Anemia of chronic disease 13.  Leukocytosis - 19.6 14.  Tachypnea  Plan: 1.  Medicine admitting, surgery will follow 2.  NPO, bowel rest, IVF, pain control, antiemetics 3.  Would recommend consultation by oncology, GI, and IR 4.  Based on patients complaints it may be that her IR drain is obstructed and may need to be flushed and/or replaced by IR 5.  Low threshold for NG tube placement 6.  G/J tubes were discussed at last admission.  Recommended for last resort if she doesn't respond to chemo as she has a significant change of poor emptying after the procedure.     Coralie Keens, [facility] 12/13/2013, 7:23 AM Pager: [phone]

## 2013-12-16 NOTE — Progress Notes (Signed)
IR will hold off on biliary drain exchange for now, after flushing biliary drain today and bag to gravity now 300 cc output and family would like to hold off on procedure until closer to discharge date. Dr. Annamaria Boots is aware and ok with the new plan, continue drain care and monitor output daily.  Tsosie Billing PA-C Interventional Radiology  12/31/2013  1:39 PM

## 2013-12-16 NOTE — H&P (Signed)
[facility] History and Physical  April Spence ZOX:[id number] DOB: 16-Jan-1956 DOA: 2013-12-21  Referring physician: ER physician PCP: Alesia Richards, MD   Chief Complaint: vomiting, biliary drain with minimal output  HPI:  58 -year-old female with past medical history significant for metastatic colon cancer, biliary obstruction and stent placement, recent admission (3/11 until 3/16 with similar presentation) who now presents to [facility] ED 2013/12/21 with ongoing nausea, vomiting started at 5 pm 1 day prior to this admission. She also noticed right upper quadrant abdominal pain and her biliary drain did not have much output. She did not have fever or chills. She had few episodes of diarrhea but this is now resolved. No lightheadedness. No chest pain, palpitations. She was not short of breath on admission but at the time of this interview she is little short of breath. Of note, her biliary catheter was placed by IR in July 2014, last changed 11/14/13.  In ED, vitals are stable with BP 158/84, HR 97, Tmax 98.7 F nad oxygen saturation 95% on 2 L [place]. Her respiratory status worsened a little which likely is due to uncontrolled pain. Oxygen saturation remains at 100%. Blood work revealed WBC count of 19.66, hemoglobin of 10, potassium 3.1. Acute abd imaging revealed possible mild ileus or some degree of small bowel obstruction, hazy right-sided airspace opacity which could reflect mild pneumonia and new 3.4 cm masslike density at the left midlung zone, malignancy cannot be excluded; this could reflect metastatic disease.   Assessment/Plan:   Principal Problem:  Abdominal pain, nausea and vomiting in the setting of possible small bowel obstruction  Possible ileus or SBO as evident on abdominal x ray. IR to evaluate the biliary drain and if it is functioning properly. Appreciate surgery following.  Please note that the pt had EGD on recent admission with findings of descending duodenum obstruction by  an infiltrating process, resulting in a 3 cm long stricture extending from the postbulbar duodenum, accross papilla, into 2nd portion duodenum. Continue IV fluids, NPO, antiemetics and analgesia PRN. Active Problems:  Acute respiratory failure No significant hypoxia but she is short of breath. Likely due to pneumonia. Start Levaquin 750 mg IV daily. Hypothyroidism  Continue synthroid Colon cancer metastasized to liver  Has biliary drain; IR to eval if functioning properly. Severe protein calorie malnutrition  Secondary to history of progressive malignancy. Continue megace.  Hypokalemia  Secondary to GI losses. Replete.  Anemia of chronic disease  Secondary to history of malignancy  Leukocytosis  Secondary to possible pneumonia. Levaquin started.   Code Status: Full code  Family Communication: Family at the bedside  Disposition Plan: Home when stable   Consultants:  Surgery  IR Procedures:  None Antibiotics:  Levaquin 2013/12/21 -->   Radiological Exams on Admission: Dg Abd Acute W/chest December 21, 2013   IMPRESSION: 1. Apparent air-fluid levels within small and large bowel loops, raising concern for either mild ileus or some degree of small bowel obstruction. No free intra-abdominal air seen. 2. Hazy right-sided airspace opacity noted; this could reflect mild pneumonia. 3. New 3.4 cm masslike density at the left midlung zone. Malignancy cannot be excluded; this could reflect metastatic disease, given the patient's history of metastatic colon cancer.      Leisa Lenz, MD  [facility] Pager [phone]  Review of Systems:  Constitutional: Negative for fever, chills and malaise/fatigue. Negative for diaphoresis.  HENT: Negative for hearing loss, ear pain, nosebleeds, congestion, sore throat, neck pain, tinnitus and ear discharge.  Eyes: Negative for blurred  vision, double vision, photophobia, pain, discharge and redness.  Respiratory: Negative for cough, hemoptysis, sputum  production, shortness of breath, wheezing and stridor.   Cardiovascular: Negative for chest pain, palpitations, orthopnea, claudication and leg swelling.  Gastrointestinal: per HPI  Genitourinary: Negative for dysuria, urgency, frequency, hematuria and flank pain.  Musculoskeletal: Negative for myalgias, back pain, joint pain and falls.  Skin: Negative for itching and rash.  Neurological: Negative for dizziness and weakness. Negative for tingling, tremors, sensory change, speech change, focal weakness, loss of consciousness and headaches.  Endo/Heme/Allergies: Negative for environmental allergies and polydipsia. Does not bruise/bleed easily.  Psychiatric/Behavioral: Negative for suicidal ideas. The patient is not nervous/anxious.      Past Medical History  Diagnosis Date  . colon ca dx'd 04/2009    chemo comp 11/2009.. colon and liver  . Spina bifida   . Scoliosis   . Hyperlipidemia   . Foot drop     bilateral,wears braces  . Mitral valve prolapse   . Hypothyroidism   . Depression   . History of chemotherapy     5-FU and Avastin 12 cycles FOLFOX  . Colon cancer 05/06/2010    metastatic  . Hepatic metastases 08/15/12    per ct abdomen  . Radiation 10/21/12-10/28/12    Palliative liver mets 54 gray in 3 fx's  . Arrhythmia 02-26-13    "rapid heart rate occ."  . History of radiation therapy 10/21/2012-10/28/2012    54 gray to liver metastasis  . History of radiation therapy 04/07/2013-04/24/2013    45 gray to liver metastasis  . Hypertension 11/19/2013   Past Surgical History  Procedure Laterality Date  . Cholecystectomy    . Back surgery  1980 and 2010  . Knee arthroscopy  2005    left  . Tubal ligation  1973  . Colonoscopy  05/05/2010  . Colon surgery      2010  . Portacath placement  05/30/2011    tip at cavoatrialjjunction by xray  . Ercp N/A 02/27/2013    Procedure: ENDOSCOPIC RETROGRADE CHOLANGIOPANCREATOGRAPHY (ERCP);  Surgeon: Milus Banister, MD;  Location: [facility];   Service: Endoscopy;  Laterality: N/A;  . Biliary stent placement N/A 02/27/2013    Procedure: BILIARY STENT PLACEMENT;  Surgeon: Milus Banister, MD;  Location: [facility];  Service: Endoscopy;  Laterality: N/A;  . Percutaneous transhepatic cholangiograhpy and billiary drainage  03/21/2013  . Dental work  09/2013  . Esophagogastroduodenoscopy N/A 11/21/2013    Procedure: ESOPHAGOGASTRODUODENOSCOPY (EGD);  Surgeon: Lafayette Dragon, MD;  Location: [facility];  Service: Endoscopy;  Laterality: N/A;   Social History:  reports that she quit smoking about 7 years ago. She has never used smokeless tobacco. She reports that she does not drink alcohol or use illicit drugs.  Allergies  Allergen Reactions  . Adhesive [Tape]     Pt is fine with paper tape  . Codeine Nausea And Vomiting    Pt denies this    Family History  Problem Relation Age of Onset  . Cancer Sister     breast  . Cancer Maternal Grandmother     breast  . Cancer Other 69    breast ca     Prior to Admission medications   Medication Sig Start Date End Date Taking? Authorizing Provider  Alum & Mag Hydroxide-Simeth (MAGIC MOUTHWASH) SOLN Take 10 mLs by mouth 4 (four) times daily as needed for mouth pain. Mouthsores 11/24/13  Yes Costin Karlyne Greenspan, MD  amitriptyline (ELAVIL) 25 MG tablet  Take 2-3 tablets (50-75 mg total) by mouth every evening. As needed for chronic back pain 11/24/13  Yes Costin Karlyne Greenspan, MD  capecitabine (XELODA) 500 MG tablet Take 2 tablets by mouth (=1000 mg) in AM; Take 2 tablets by mouth (=1000 mg) in PM.  Total daily dose of 2000 mg for 14 days.  Start 12/30/2013 12/08/13  Yes Ladell Pier, MD  chlorhexidine (PERIDEX) 0.12 % solution Use as directed 15 mLs in the mouth or throat 2 (two) times daily. 11/24/13  Yes Costin Karlyne Greenspan, MD  Cholecalciferol (VITAMIN D-3) 5000 UNITS TABS Take 5,000 Units by mouth 2 (two) times daily.    Yes Historical Provider, MD  diphenoxylate-atropine (LOMOTIL) 2.5-0.025 MG per  tablet Take 1 tablet by mouth 4 (four) times daily as needed for diarrhea or loose stools. 12/11/13  Yes Ladell Pier, MD  docusate sodium (COLACE) 100 MG capsule Take 1 capsule (100 mg total) by mouth 2 (two) times daily. 11/24/13  Yes Costin Karlyne Greenspan, MD  FLUoxetine (PROZAC) 20 MG capsule Take 1 capsule (20 mg total) by mouth every morning. 11/24/13  Yes Costin Karlyne Greenspan, MD  levothyroxine (SYNTHROID, LEVOTHROID) 150 MCG tablet Take 1 tablet (150 mcg total) by mouth daily before breakfast. 11/24/13  Yes Costin Karlyne Greenspan, MD  lidocaine-prilocaine (EMLA) cream Apply 1 application topically as needed (use on port).   Yes Historical Provider, MD  megestrol (MEGACE ORAL) 40 MG/ML suspension Take 5 mLs (200 mg total) by mouth 2 (two) times daily. 11/24/13  Yes Costin Karlyne Greenspan, MD  morphine (MS CONTIN) 30 MG 12 hr tablet Take 1 tablet (30 mg total) by mouth every 12 (twelve) hours. 12/08/13  Yes Ladell Pier, MD  ondansetron (ZOFRAN) 8 MG tablet Take 1 tablet (8 mg total) by mouth every 12 (twelve) hours as needed for nausea or vomiting. 11/24/13  Yes Costin Karlyne Greenspan, MD  oxyCODONE-acetaminophen (PERCOCET/ROXICET) 5-325 MG per tablet Take 2 tablets by mouth every 4 (four) hours as needed for moderate pain or severe pain. 12/08/13  Yes Ladell Pier, MD  prochlorperazine (COMPAZINE) 10 MG tablet Take 1 tablet (10 mg total) by mouth every 6 (six) hours as needed (nausea). 11/24/13  Yes Costin Karlyne Greenspan, MD  propranolol (INDERAL) 80 MG tablet Take 80 mg by mouth daily.   Yes Historical Provider, MD  traMADol-acetaminophen (ULTRACET) 37.5-325 MG per tablet Take 1 tablet by mouth every 6 (six) hours as needed for moderate pain. 11/24/13  Yes Costin Karlyne Greenspan, MD   Physical Exam: Filed Vitals:   12/27/2013 0432  BP: 158/84  Pulse: 97  Temp: 98.7 F (37.1 C)  TempSrc: Oral  Resp: 18  SpO2: 95%    Physical Exam  Constitutional: Appears in distress, jaundiced HENT: Normocephalic. No tonsillar erythema or  exufates Eyes: Conjunctivae pale, slera icteric Neck: Normal ROM. Neck supple. No JVD. No tracheal deviation. No thyromegaly.  CVS: RRR, S1/S2 +, no murmurs, no gallops, no carotid bruit.  Pulmonary: Effort and breath sounds normal, no stridor, rhonchi, wheezes, rales.  Abdominal: Soft. BS +, tender in RUQ, biliary drain in place on left side of abd Musculoskeletal: Normal range of motion. +1-2 LE edema and no tenderness.  Lymphadenopathy: No lymphadenopathy noted, cervical, inguinal. Neuro: Alert. Normal reflexes, muscle tone coordination. No cranial nerve deficit. Skin: Skin is warm and dry. No rash noted. Jaundice Psychiatric: Normal mood and affect. Behavior, judgment, thought content normal.   Labs on Admission:  Basic Metabolic Panel:  Recent Labs  Lab 12/09/13 1141 12/21/2013 0540  NA 132* 140  K 3.9 3.1*  CL  --  91*  CO2 24 30  GLUCOSE 212* 144*  BUN 11.2 13  CREATININE 0.6 0.31*  CALCIUM 9.5 10.2   Liver Function Tests:  Recent Labs Lab 12/09/13 1141 01/06/2014 0540  AST 45* 84*  ALT 19 31  ALKPHOS 263* 277*  BILITOT 3.53* 5.9*  PROT 8.4* 9.1*  ALBUMIN 2.0* 2.2*    Recent Labs Lab 12/30/2013 0540  LIPASE 45   No results found for this basename: AMMONIA,  in the last 168 hours CBC:  Recent Labs Lab 12/09/13 1143 12/10/2013 0540  WBC 13.4* 19.6*  NEUTROABS 11.1*  --   HGB 10.0* 10.2*  HCT 30.7* 30.8*  MCV 89.8 87.3  PLT 221 393   Cardiac Enzymes: No results found for this basename: CKTOTAL, CKMB, CKMBINDEX, TROPONINI,  in the last 168 hours BNP: No components found with this basename: POCBNP,  CBG: No results found for this basename: GLUCAP,  in the last 168 hours  If 7PM-7AM, please contact night-coverage [email or web address] Password [email or web address] 12/30/2013, 7:30 AM

## 2013-12-16 NOTE — Progress Notes (Signed)
CRITICAL VALUE ALERT  Critical value received:  Positive MRSA PCR swab  Date of notification:    Time of notification:    Critical value read back:yes  Nurse who received alert:  Erich Montane, RN  MD notified (1st page):    Time of first page:    MD notified (2nd page):  Time of second page:  Responding MD:    Time MD responded:     Pt place on contact precautions

## 2013-12-16 NOTE — ED Notes (Signed)
Pt reports nausea starting at 5pm yesterday. Pt states that she has had constant vomiting that started yellow and is now very dark. Pt states she is on a liquid diet but can not keep any thing down. Pt alert and oriented. Family at bedside.

## 2013-12-16 NOTE — H&P (Signed)
Chief Complaint: "Nausea and vomiting with decreased drain output and increased leaking around site." HPI: April Spence is an 58 y.o. female with metastatic colon cancer with biliary obstruction s/p internal/external biliary drain placed. The patient was seen on 3/31 with complaints of leaking around drain site and increased air in the biliary bag, patient had drain sutured to skin site and no other intervention was needed at that time. She was scheduled for routine drain exchange on 4/17. She presented to ED today with c/o RUQ abdominal pain, diarrhea x 3-4 days and nausea and vomiting since last night. Her husband states he went to hook the biliary drainage bag up to the catheter last evening and minimal to no output was seen, they have been flushing the catheter daily as instructed with no difficulty and she was previously eating and tolerating food until last evening. IR will proceed with biliary catheter exchange today. She has previously tolerated sedation without complications. She denies any chest pain or shortness of breath.   Past Medical History:  Past Medical History  Diagnosis Date  . colon ca dx'd 04/2009    chemo comp 11/2009.. colon and liver  . Spina bifida   . Scoliosis   . Hyperlipidemia   . Foot drop     bilateral,wears braces  . Mitral valve prolapse   . Hypothyroidism   . Depression   . History of chemotherapy     5-FU and Avastin 12 cycles FOLFOX  . Colon cancer 05/06/2010    metastatic  . Hepatic metastases 08/15/12    per ct abdomen  . Radiation 10/21/12-10/28/12    Palliative liver mets 54 gray in 3 fx's  . Arrhythmia 02-26-13    "rapid heart rate occ."  . History of radiation therapy 10/21/2012-10/28/2012    54 gray to liver metastasis  . History of radiation therapy 04/07/2013-04/24/2013    45 gray to liver metastasis  . Hypertension 11/19/2013    Past Surgical History:  Past Surgical History  Procedure Laterality Date  . Cholecystectomy    . Back surgery   1980 and 2010  . Knee arthroscopy  2005    left  . Tubal ligation  1973  . Colonoscopy  05/05/2010  . Colon surgery      2010  . Portacath placement  05/30/2011    tip at cavoatrialjjunction by xray  . Ercp N/A 02/27/2013    Procedure: ENDOSCOPIC RETROGRADE CHOLANGIOPANCREATOGRAPHY (ERCP);  Surgeon: Milus Banister, MD;  Location: [facility];  Service: Endoscopy;  Laterality: N/A;  . Biliary stent placement N/A 02/27/2013    Procedure: BILIARY STENT PLACEMENT;  Surgeon: Milus Banister, MD;  Location: [facility];  Service: Endoscopy;  Laterality: N/A;  . Percutaneous transhepatic cholangiograhpy and billiary drainage  03/21/2013  . Dental work  09/2013  . Esophagogastroduodenoscopy N/A 11/21/2013    Procedure: ESOPHAGOGASTRODUODENOSCOPY (EGD);  Surgeon: Lafayette Dragon, MD;  Location: [facility];  Service: Endoscopy;  Laterality: N/A;    Family History:  Family History  Problem Relation Age of Onset  . Cancer Sister     breast  . Cancer Maternal Grandmother     breast  . Cancer Other 67    breast ca    Social History:  reports that she quit smoking about 7 years ago. She has never used smokeless tobacco. She reports that she does not drink alcohol or use illicit drugs.  Allergies:  Allergies  Allergen Reactions  . Adhesive [Tape]     Pt is fine  with paper tape  . Codeine Nausea And Vomiting    Pt denies this    Medications:   Medication List    ASK your doctor about these medications       amitriptyline 25 MG tablet  Commonly known as:  ELAVIL  Take 2-3 tablets (50-75 mg total) by mouth every evening. As needed for chronic back pain     capecitabine 500 MG tablet  Commonly known as:  XELODA  Take 2 tablets by mouth (=1000 mg) in AM; Take 2 tablets by mouth (=1000 mg) in PM.  Total daily dose of 2000 mg for 14 days.  Start 12/31/2013     chlorhexidine 0.12 % solution  Commonly known as:  PERIDEX  Use as directed 15 mLs in the mouth or throat 2 (two) times daily.      diphenoxylate-atropine 2.5-0.025 MG per tablet  Commonly known as:  LOMOTIL  Take 1 tablet by mouth 4 (four) times daily as needed for diarrhea or loose stools.     docusate sodium 100 MG capsule  Commonly known as:  COLACE  Take 1 capsule (100 mg total) by mouth 2 (two) times daily.     FLUoxetine 20 MG capsule  Commonly known as:  PROZAC  Take 1 capsule (20 mg total) by mouth every morning.     levothyroxine 150 MCG tablet  Commonly known as:  SYNTHROID, LEVOTHROID  Take 1 tablet (150 mcg total) by mouth daily before breakfast.     lidocaine-prilocaine cream  Commonly known as:  EMLA  Apply 1 application topically as needed (use on port).     magic mouthwash Soln  Take 10 mLs by mouth 4 (four) times daily as needed for mouth pain. Mouthsores     megestrol 40 MG/ML suspension  Commonly known as:  MEGACE ORAL  Take 5 mLs (200 mg total) by mouth 2 (two) times daily.     morphine 30 MG 12 hr tablet  Commonly known as:  MS CONTIN  Take 1 tablet (30 mg total) by mouth every 12 (twelve) hours.     ondansetron 8 MG tablet  Commonly known as:  ZOFRAN  Take 1 tablet (8 mg total) by mouth every 12 (twelve) hours as needed for nausea or vomiting.     oxyCODONE-acetaminophen 5-325 MG per tablet  Commonly known as:  PERCOCET/ROXICET  Take 2 tablets by mouth every 4 (four) hours as needed for moderate pain or severe pain.     prochlorperazine 10 MG tablet  Commonly known as:  COMPAZINE  Take 1 tablet (10 mg total) by mouth every 6 (six) hours as needed (nausea).     propranolol 80 MG tablet  Commonly known as:  INDERAL  Take 80 mg by mouth daily.     traMADol-acetaminophen 37.5-325 MG per tablet  Commonly known as:  ULTRACET  Take 1 tablet by mouth every 6 (six) hours as needed for moderate pain.     Vitamin D-3 5000 UNITS Tabs  Take 5,000 Units by mouth 2 (two) times daily.       Please HPI for pertinent positives, otherwise complete 10 system ROS negative.  Physical  Exam: BP 167/85  Pulse 99  Temp(Src) 98.7 F (37.1 C) (Oral)  Resp 40  Ht 5' (1.524 m)  Wt 118 lb 12.8 oz (53.887 kg)  BMI 23.20 kg/m2  SpO2 95% Body mass index is 23.2 kg/(m^2).  General Appearance:  Alert, cooperative, no distress  Head:  Normocephalic, without obvious abnormality, atraumatic  Neck: Supple, symmetrical, trachea midline  Lungs:   Clear to auscultation bilaterally, no w/r/r, respirations unlabored without use of accessory muscles.  Chest Wall:  No tenderness or deformity  Heart:  Regular rate and rhythm, S1, S2 normal, no murmur, rub or gallop.  Abdomen:   Soft, non-tender, non distended, biliary drain catheter dressing with bile staining, no evidence of bleeding or skin breakdown, aspirated and flushed catheter today with bile return, bag now connected with air and thin light bile with sediment throughout.   Extremities: Bilateral foot drop, atraumatic, no cyanosis or edema  Neurologic: Normal affect, no gross deficits.   Results for orders placed during the hospital encounter of 12/15/2013 (from the past 48 hour(s))  CBC     Status: Abnormal   Collection Time    12/17/2013  5:40 AM      Result Value Ref Range   WBC 19.6 (*) 4.0 - 10.5 K/uL   RBC 3.53 (*) 3.87 - 5.11 MIL/uL   Hemoglobin 10.2 (*) 12.0 - 15.0 g/dL   HCT 30.8 (*) 36.0 - 46.0 %   MCV 87.3  78.0 - 100.0 fL   MCH 28.9  26.0 - 34.0 pg   MCHC 33.1  30.0 - 36.0 g/dL   RDW 17.9 (*) 11.5 - 15.5 %   Platelets 393  150 - 400 K/uL  COMPREHENSIVE METABOLIC PANEL     Status: Abnormal   Collection Time    12/13/2013  5:40 AM      Result Value Ref Range   Sodium 140  137 - 147 mEq/L   Potassium 3.1 (*) 3.7 - 5.3 mEq/L   Chloride 91 (*) 96 - 112 mEq/L   CO2 30  19 - 32 mEq/L   Glucose, Bld 144 (*) 70 - 99 mg/dL   BUN 13  6 - 23 mg/dL   Creatinine, Ser 0.31 (*) 0.50 - 1.10 mg/dL   Calcium 10.2  8.4 - 10.5 mg/dL   Total Protein 9.1 (*) 6.0 - 8.3 g/dL   Albumin 2.2 (*) 3.5 - 5.2 g/dL   AST 84 (*) 0 - 37 U/L    ALT 31  0 - 35 U/L   Alkaline Phosphatase 277 (*) 39 - 117 U/L   Total Bilirubin 5.9 (*) 0.3 - 1.2 mg/dL   GFR calc non Af Amer >90  >90 mL/min   GFR calc Af Amer >90  >90 mL/min   Comment: (NOTE)     The eGFR has been calculated using the CKD EPI equation.     This calculation has not been validated in all clinical situations.     eGFR's persistently <90 mL/min signify possible Chronic Kidney     Disease.  LIPASE, BLOOD     Status: None   Collection Time    01/01/2014  5:40 AM      Result Value Ref Range   Lipase 45  11 - 59 U/L  URINALYSIS, ROUTINE W REFLEX MICROSCOPIC     Status: Abnormal   Collection Time    12/17/2013  6:43 AM      Result Value Ref Range   Color, Urine ORANGE (*) YELLOW   Comment: BIOCHEMICALS MAY BE AFFECTED BY COLOR   APPearance CLEAR  CLEAR   Specific Gravity, Urine 1.024  1.005 - 1.030   pH 8.0  5.0 - 8.0   Glucose, UA NEGATIVE  NEGATIVE mg/dL   Hgb urine dipstick NEGATIVE  NEGATIVE   Bilirubin Urine SMALL (*) NEGATIVE   Ketones, ur  NEGATIVE  NEGATIVE mg/dL   Protein, ur 30 (*) NEGATIVE mg/dL   Urobilinogen, UA 1.0  0.0 - 1.0 mg/dL   Nitrite NEGATIVE  NEGATIVE   Leukocytes, UA SMALL (*) NEGATIVE  URINE MICROSCOPIC-ADD ON     Status: Abnormal   Collection Time    01/02/2014  6:43 AM      Result Value Ref Range   Squamous Epithelial / LPF RARE  RARE   WBC, UA 3-6  <3 WBC/hpf   RBC / HPF 0-2  <3 RBC/hpf   Bacteria, UA FEW (*) RARE   Casts HYALINE CASTS (*) NEGATIVE   Dg Abd Acute W/chest  12/25/2013   CLINICAL DATA:  Abdominal pain, nausea and vomiting.  EXAM: ACUTE ABDOMEN SERIES (ABDOMEN 2 VIEW & CHEST 1 VIEW)  COMPARISON:  Chest radiograph performed 05/16/2011, and CT of the abdomen and pelvis performed 11/20/2013  FINDINGS: The lungs are hypoexpanded. Hazy right-sided airspace opacity is noted. A new 3.4 cm masslike density is noted at the left midlung zone. Vascular congestion is noted. No definite pleural effusion or pneumothorax is seen. The  cardiomediastinal silhouette is within normal limits. A left-sided chest port is noted ending about the cavoatrial junction.  The bowel gas pattern is difficult to assess due to limitations in positioning. Decubitus views demonstrate apparent air-fluid levels within small and large bowel loops, raising concern for either ileus or obstruction. A drainage catheter and stent are noted at the right abdomen. No free intra-abdominal air is identified on the provided decubitus view.  Left convex thoracic and right convex lumbar scoliosis is noted. No acute osseous abnormalities are seen.  IMPRESSION: 1. Apparent air-fluid levels within small and large bowel loops, raising concern for either mild ileus or some degree of small bowel obstruction. No free intra-abdominal air seen. 2. Hazy right-sided airspace opacity noted; this could reflect mild pneumonia. 3. New 3.4 cm masslike density at the left midlung zone. Malignancy cannot be excluded; this could reflect metastatic disease, given the patient's history of metastatic colon cancer.   Electronically Signed   By: Garald Balding M.D.   On: 12/21/2013 05:52    Assessment/Plan Metastatic colon cancer with biliary obstruction s/p internal/external biliary drain last exchanged 70F with sideholes 11/14/13 Admitted with abdominal pain, nausea/vomiting, decreased drain output with total bilirubin 5.9 (3.5 on 3/31). Discussed with Dr. Annamaria Boots, will proceed with biliary drain exchange today.  Patient has been NPO, afebrile, cipro ordered on call to IR Risks and Benefits discussed with the patient. All of the patient's questions were answered, patient is agreeable to proceed. Consent signed and in chart.   Tsosie Billing D PA-C 12/24/2013, 11:37 AM

## 2013-12-16 NOTE — ED Notes (Signed)
Dr. Charlies Silvers paged to alert of patient's rapid breathing.

## 2013-12-17 ENCOUNTER — Encounter (HOSPITAL_COMMUNITY): Payer: Self-pay | Admitting: *Deleted

## 2013-12-17 DIAGNOSIS — K56609 Unspecified intestinal obstruction, unspecified as to partial versus complete obstruction: Secondary | ICD-10-CM

## 2013-12-17 DIAGNOSIS — C20 Malignant neoplasm of rectum: Secondary | ICD-10-CM

## 2013-12-17 DIAGNOSIS — J189 Pneumonia, unspecified organism: Secondary | ICD-10-CM | POA: Diagnosis present

## 2013-12-17 LAB — GLUCOSE, CAPILLARY
GLUCOSE-CAPILLARY: 149 mg/dL — AB (ref 70–99)
GLUCOSE-CAPILLARY: 150 mg/dL — AB (ref 70–99)
Glucose-Capillary: 162 mg/dL — ABNORMAL HIGH (ref 70–99)
Glucose-Capillary: 156 mg/dL — ABNORMAL HIGH (ref 70–99)
Glucose-Capillary: 160 mg/dL — ABNORMAL HIGH (ref 70–99)
Glucose-Capillary: 153 mg/dL — ABNORMAL HIGH (ref 70–99)

## 2013-12-17 LAB — CBC
HCT: 29.3 % — ABNORMAL LOW (ref 36.0–46.0)
Hemoglobin: 9.5 g/dL — ABNORMAL LOW (ref 12.0–15.0)
MCH: 28.9 pg (ref 26.0–34.0)
MCHC: 32.4 g/dL (ref 30.0–36.0)
MCV: 89.1 fL (ref 78.0–100.0)
Platelets: 397 10*3/uL (ref 150–400)
RBC: 3.29 MIL/uL — AB (ref 3.87–5.11)
RDW: 18.5 % — AB (ref 11.5–15.5)
WBC: 19.6 10*3/uL — ABNORMAL HIGH (ref 4.0–10.5)

## 2013-12-17 LAB — COMPREHENSIVE METABOLIC PANEL
ALBUMIN: 1.8 g/dL — AB (ref 3.5–5.2)
ALT: 602 U/L — ABNORMAL HIGH (ref 0–35)
AST: 2451 U/L — ABNORMAL HIGH (ref 0–37)
Alkaline Phosphatase: 287 U/L — ABNORMAL HIGH (ref 39–117)
BILIRUBIN TOTAL: 4.9 mg/dL — AB (ref 0.3–1.2)
BUN: 33 mg/dL — AB (ref 6–23)
CO2: 31 meq/L (ref 19–32)
CREATININE: 1 mg/dL (ref 0.50–1.10)
Calcium: 8.8 mg/dL (ref 8.4–10.5)
Chloride: 96 mEq/L (ref 96–112)
GFR calc non Af Amer: 60 mL/min — ABNORMAL LOW (ref >90)
GFR, EST AFRICAN AMERICAN: 70 mL/min — AB (ref >90)
GLUCOSE: 182 mg/dL — AB (ref 70–99)
POTASSIUM: 4.2 meq/L (ref 3.7–5.3)
Sodium: 139 mEq/L (ref 137–147)
Total Protein: 7.4 g/dL (ref 6.0–8.3)

## 2013-12-17 MED ORDER — PIPERACILLIN-TAZOBACTAM 3.375 G IVPB
3.3750 g | Freq: Three times a day (TID) | INTRAVENOUS | Status: DC
  Administered 2013-12-17 – 2013-12-19 (×6): 3.375 g via INTRAVENOUS
  Filled 2013-12-17 (×7): qty 50

## 2013-12-17 MED ORDER — VANCOMYCIN HCL 500 MG IV SOLR
500.0000 mg | Freq: Two times a day (BID) | INTRAVENOUS | Status: DC
  Administered 2013-12-18: 500 mg via INTRAVENOUS
  Filled 2013-12-17 (×2): qty 500

## 2013-12-17 MED ORDER — VANCOMYCIN HCL 500 MG IV SOLR
500.0000 mg | Freq: Once | INTRAVENOUS | Status: AC
  Administered 2013-12-17: 500 mg via INTRAVENOUS
  Filled 2013-12-17: qty 500

## 2013-12-17 NOTE — Progress Notes (Signed)
Subjective: Pt feels much better today, minimal abdominal pain.  Husband says her perc biliary drain has been emptied 2 times and is darker than normal.  No N/V.  Wants something to drink.    Objective: Vital signs in last 24 hours: Temp:  [97.8 F (36.6 C)-98.6 F (37 C)] 98.6 F (37 C) (04/08 0800) Pulse Rate:  [101-106] 103 (04/08 1012) Resp:  [15-44] 15 (04/08 0620) BP: (82-176)/(46-90) 89/48 mmHg (04/08 1012) SpO2:  [99 %-100 %] 99 % (04/08 0620) Last BM Date: 12/15/13  Intake/Output from previous day: 04/07 0701 - 04/08 0700 In: 1450 [I.V.:1350; IV Piggyback:100] Out: 900 [Drains:900] Intake/Output this shift:    PE: Gen:  Alert, NAD, pleasant Abd: Soft, minimal tenderness, ND, great BS, no HSM, perc biliary drain in LUQ with dark green/brown output and the bag is almost full   Lab Results:   Recent Labs  01/08/2014 1135 12/17/13 0615  WBC 18.9* 19.6*  HGB 10.1* 9.5*  HCT 30.4* 29.3*  PLT 413* 397   BMET  Recent Labs  12/11/2013 1135 12/17/13 0615  NA 145 139  K 3.9 4.2  CL 94* 96  CO2 33* 31  GLUCOSE 130* 182*  BUN 15 33*  CREATININE 0.36* 1.00  CALCIUM 9.7 8.8   PT/INR  Recent Labs  12/11/2013 1135  LABPROT 21.3*  INR 1.91*   CMP     Component Value Date/Time   NA 139 12/17/2013 0615   NA 132* 12/09/2013 1141   NA 136 05/02/2011 0954   K 4.2 12/17/2013 0615   K 3.9 12/09/2013 1141   K 4.5 05/02/2011 0954   CL 96 12/17/2013 0615   CL 99 02/25/2013 1124   CL 93* 05/02/2011 0954   CO2 31 12/17/2013 0615   CO2 24 12/09/2013 1141   CO2 29 05/02/2011 0954   GLUCOSE 182* 12/17/2013 0615   GLUCOSE 212* 12/09/2013 1141   GLUCOSE 210* 02/25/2013 1124   GLUCOSE 119* 05/02/2011 0954   BUN 33* 12/17/2013 0615   BUN 11.2 12/09/2013 1141   BUN 12 05/02/2011 0954   CREATININE 1.00 12/17/2013 0615   CREATININE 0.6 12/09/2013 1141   CREATININE 0.5* 05/02/2011 0954   CALCIUM 8.8 12/17/2013 0615   CALCIUM 9.5 12/09/2013 1141   CALCIUM 9.3 05/02/2011 0954   PROT 7.4 12/17/2013  0615   PROT 8.4* 12/09/2013 1141   PROT 8.1 05/02/2011 0954   ALBUMIN 1.8* 12/17/2013 0615   ALBUMIN 2.0* 12/09/2013 1141   AST 2451* 12/17/2013 0615   AST 45* 12/09/2013 1141   AST 31 05/02/2011 0954   ALT 602* 12/17/2013 0615   ALT 19 12/09/2013 1141   ALT 26 05/02/2011 0954   ALKPHOS 287* 12/17/2013 0615   ALKPHOS 263* 12/09/2013 1141   ALKPHOS 78 05/02/2011 0954   BILITOT 4.9* 12/17/2013 0615   BILITOT 3.53* 12/09/2013 1141   BILITOT 0.40 05/02/2011 0954   GFRNONAA 60* 12/17/2013 0615   GFRAA 70* 12/17/2013 0615   Lipase     Component Value Date/Time   LIPASE 45 12/30/2013 0540       Studies/Results: Dg Abd Acute W/chest  12/14/2013   CLINICAL DATA:  Abdominal pain, nausea and vomiting.  EXAM: ACUTE ABDOMEN SERIES (ABDOMEN 2 VIEW & CHEST 1 VIEW)  COMPARISON:  Chest radiograph performed 05/16/2011, and CT of the abdomen and pelvis performed 11/20/2013  FINDINGS: The lungs are hypoexpanded. Hazy right-sided airspace opacity is noted. A new 3.4 cm masslike density is noted at the left midlung zone. Vascular  congestion is noted. No definite pleural effusion or pneumothorax is seen. The cardiomediastinal silhouette is within normal limits. A left-sided chest port is noted ending about the cavoatrial junction.  The bowel gas pattern is difficult to assess due to limitations in positioning. Decubitus views demonstrate apparent air-fluid levels within small and large bowel loops, raising concern for either ileus or obstruction. A drainage catheter and stent are noted at the right abdomen. No free intra-abdominal air is identified on the provided decubitus view.  Left convex thoracic and right convex lumbar scoliosis is noted. No acute osseous abnormalities are seen.  IMPRESSION: 1. Apparent air-fluid levels within small and large bowel loops, raising concern for either mild ileus or some degree of small bowel obstruction. No free intra-abdominal air seen. 2. Hazy right-sided airspace opacity noted; this could reflect  mild pneumonia. 3. New 3.4 cm masslike density at the left midlung zone. Malignancy cannot be excluded; this could reflect metastatic disease, given the patient's history of metastatic colon cancer.   Electronically Signed   By: Garald Balding M.D.   On: 01-02-2014 05:52    Anti-infectives: Anti-infectives   Start     Dose/Rate Route Frequency Ordered Stop   02-Jan-2014 1800  levofloxacin (LEVAQUIN) IVPB 750 mg     750 mg 100 mL/hr over 90 Minutes Intravenous Every 24 hours 2014-01-02 1734     2014-01-02 1108  ciprofloxacin (CIPRO) IVPB 400 mg  Status:  Discontinued    Comments:  FOR IR TO GIVE DURING PROCEDURE   400 mg 200 mL/hr over 60 Minutes Intravenous 30 min pre-op 01/02/14 1105 02-Jan-2014 1340       Assessment/Plan 1. Abdominal pain 2* #3/4/5  2. Nausea/vomiting  3. Gastric outlet obstruction  4. Biliary obstruction with IR placed billiary drain left hepatic duct - 03/13/13  5. 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 Spina Bifida/scoliosis with foot drop  7. MVP  8. Depression  9. Hypertension  10. Tachycardia  11. Severe PCM  12. Anemia of chronic disease  13. Leukocytosis - 19.6  14. Tachypnea   Plan:  1. Seems to be resolving some what since perc biliary drain started working again 2. IVF, pain control, antiemetics  3. Start sips of clears, if tolerates can likely advance to clear tray 4. Low threshold for NG tube placement  5. G/J tubes were discussed at last admission. Recommended for last resort if she doesn't respond to conservative management and eventually chemo as she has a significant change of poor emptying after the procedure.      LOS: 1 day    Coralie Keens 12/17/2013, 10:40 AM Pager: [phone]

## 2013-12-17 NOTE — Progress Notes (Signed)
ANTIBIOTIC CONSULT NOTE - INITIAL  Pharmacy Consult for Vancomycin, Zosyn Indication: cholangitis, PNA  Allergies  Allergen Reactions  . Adhesive [Tape]     Pt is fine with paper tape  . Codeine Nausea And Vomiting    Pt denies this    Patient Measurements: Height: 5' (152.4 cm) Weight: 125 lb 10.6 oz (57 kg) IBW/kg (Calculated) : 45.5  Vital Signs: Temp: 98.6 F (37 C) (04/08 1211) Temp src: Oral (04/08 1211) BP: 89/48 mmHg (04/08 1012) Pulse Rate: 103 (04/08 1100) Intake/Output from previous day: 04/07 0701 - 04/08 0700 In: 1450 [I.V.:1350; IV Piggyback:100] Out: 900 [Drains:900] Intake/Output from this shift: Total I/O In: 375 [I.V.:375] Out: 950 [Drains:950]  Labs:  Recent Labs  2014/01/09 0540 2014/01/09 1135 12/17/13 0615  WBC 19.6* 18.9* 19.6*  HGB 10.2* 10.1* 9.5*  PLT 393 413* 397  CREATININE 0.31* 0.36* 1.00   Estimated Creatinine Clearance: 47.9 ml/min (by C-G formula based on Cr of 1). No results found for this basename: VANCOTROUGH, Corlis Leak, VANCORANDOM, Hayden, GENTPEAK, GENTRANDOM, TOBRATROUGH, TOBRAPEAK, TOBRARND, AMIKACINPEAK, AMIKACINTROU, AMIKACIN,  in the last 72 hours   Microbiology: Recent Results (from the past 720 hour(s))  MRSA PCR SCREENING     Status: Abnormal   Collection Time    01-09-14  9:35 AM      Result Value Ref Range Status   MRSA by PCR POSITIVE (*) NEGATIVE Final   Comment:            The GeneXpert MRSA Assay (FDA     approved for NASAL specimens     only), is one component of a     comprehensive MRSA colonization     surveillance program. It is not     intended to diagnose MRSA     infection nor to guide or     monitor treatment for     MRSA infections.     RESULT CALLED TO, READ BACK BY AND VERIFIED WITH:     Lady Deutscher RN [id number] 2014/01/09 A NAVARRO    Medical History: Past Medical History  Diagnosis Date  . colon ca dx'd 04/2009    chemo comp 11/2009.. colon and liver  . Spina bifida   . Scoliosis   .  Hyperlipidemia   . Foot drop     bilateral,wears braces  . Mitral valve prolapse   . Hypothyroidism   . Depression   . History of chemotherapy     5-FU and Avastin 12 cycles FOLFOX  . Colon cancer 05/06/2010    metastatic  . Hepatic metastases 08/15/12    per ct abdomen  . Radiation 10/21/12-10/28/12    Palliative liver mets 52 gray in 3 fx's  . Arrhythmia 02-26-13    "rapid heart rate occ."  . History of radiation therapy 10/21/2012-10/28/2012    52 gray to liver metastasis  . History of radiation therapy 04/07/2013-04/24/2013    43 gray to liver metastasis  . Hypertension 11/19/2013     Assessment: 63 yoF with stage III adenocarcinoma of the cecum with biliary obstruction and liver mets presented with vomiting and decreased output from biliary drain.  She was recently admitted last month for similar complaints.  Pt was started on Levaquin on admission for possible pneumonia.  Broadening antibiotics to Vancomycin and Zosyn today to HCAP coverage and cholangitis.  Surgery, IR, and Oncology following patient.   Afebrile  WBC 19.6  SCr 1.00, increased significantly today.  CrCl ~68 ml/min (normalized), ~54 ml/min (CG, TBW = 57 kg)  No culture data available yet.  Goal of Therapy:  Vancomycin trough level 15-20 mcg/ml Doses adjusted per renal function  Plan:  1.  Vancomycin 500 mg IV q12h. 2.  Zosyn 3.375g IV q8h (4 hour infusion time). 3.  F/u SCr, culture results, clinical course.  Donald Prose Johny Pitstick 12/17/2013,1:56 PM

## 2013-12-17 NOTE — Progress Notes (Signed)
IP PROGRESS NOTE  Subjective:   She is at a 12/18/2013 with nausea/vomiting. Decreased output was noted in the biliary drain. There was increased drainage after the catheter was flushed. She reports feeling better today.  Objective: Vital signs in last 24 hours: Blood pressure 87/47, pulse 101, temperature 98.6 F (37 C), temperature source Oral, resp. rate 15, height 5' (1.524 m), weight 118 lb 12.8 oz (53.887 kg), SpO2 99.00%.  Intake/Output from previous day: 04/07 0701 - 04/08 0700 In: 1450 [I.V.:1350; IV Piggyback:100] Out: 900 [Drains:900]  Physical Exam:  Lungs: Scattered rhonchi, no respiratory distress Cardiac: Regular rate and rhythm Abdomen: No mass, nontender, biliary drain site without evidence of infection  Portacath/PICC-without erythema  Lab Results:  Recent Labs  12/27/2013 1135 12/17/13 0615  WBC 18.9* 19.6*  HGB 10.1* 9.5*  HCT 30.4* 29.3*  PLT 413* 397    BMET  Recent Labs  12/22/2013 1135 12/17/13 0615  NA 145 139  K 3.9 4.2  CL 94* 96  CO2 33* 31  GLUCOSE 130* 182*  BUN 15 33*  CREATININE 0.36* 1.00  CALCIUM 9.7 8.8    Studies/Results: Dg Abd Acute W/chest  12/28/2013   CLINICAL DATA:  Abdominal pain, nausea and vomiting.  EXAM: ACUTE ABDOMEN SERIES (ABDOMEN 2 VIEW & CHEST 1 VIEW)  COMPARISON:  Chest radiograph performed 05/16/2011, and CT of the abdomen and pelvis performed 11/20/2013  FINDINGS: The lungs are hypoexpanded. Hazy right-sided airspace opacity is noted. A new 3.4 cm masslike density is noted at the left midlung zone. Vascular congestion is noted. No definite pleural effusion or pneumothorax is seen. The cardiomediastinal silhouette is within normal limits. A left-sided chest port is noted ending about the cavoatrial junction.  The bowel gas pattern is difficult to assess due to limitations in positioning. Decubitus views demonstrate apparent air-fluid levels within small and large bowel loops, raising concern for either ileus or  obstruction. A drainage catheter and stent are noted at the right abdomen. No free intra-abdominal air is identified on the provided decubitus view.  Left convex thoracic and right convex lumbar scoliosis is noted. No acute osseous abnormalities are seen.  IMPRESSION: 1. Apparent air-fluid levels within small and large bowel loops, raising concern for either mild ileus or some degree of small bowel obstruction. No free intra-abdominal air seen. 2. Hazy right-sided airspace opacity noted; this could reflect mild pneumonia. 3. New 3.4 cm masslike density at the left midlung zone. Malignancy cannot be excluded; this could reflect metastatic disease, given the patient's history of metastatic colon cancer.   Electronically Signed   By: Garald Balding M.D.   On: 12/30/2013 05:52    Medications: I have reviewed the patient's current medications.  Assessment/Plan:  1. 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 She was scheduled to begin salvage therapy with CAPOX 12/14/2013  2. Spina bifida with severe scoliosis  3. 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 Admission 12/29/2013 with nausea/vomiting-most likely related to a partial small bowel obstruction versus biliary obstruction versus cholangitis  6. Acute elevation of the liver enzymes-? Cholangitis  She was admitted with nausea and vomiting yesterday. The bilirubin and liver enzymes are markedly elevated. It is possible her symptoms are  related to obstruction of the biliary catheter or cholangitis. The nausea and vomiting could also be related to the bowel obstruction.  I reviewed the plain x-rays in  radiology. The density of the left chest is not clearly related to tumor or pneumonia. We can check a PA and lateral chest x-ray over the next few days.  Recommendations: 1. Follow liver enzymes, emperic antibiotics for cholangitis 2. Hold planned chemotherapy 3. Advanced to liquid diet as tolerated 4. Management of biliary drain per interventional radiology     LOS: 1 day   Ladell Pier  12/17/2013, 9:50 AM

## 2013-12-17 NOTE — Progress Notes (Signed)
PROGRESS NOTE  April Spence HUD:[id number] DOB: January 04, 1956 DOA: 01-04-14 PCP: Alesia Richards, MD  Assessment/Plan: Abdominal pain, nausea and vomiting in the setting of possible small bowel obstruction - her symptoms are most likely due to biliary drain not working, appreciate IR consult, now seems to be draining well, patient with symptomatic improvement.  - patient had EGD on recent admission with findings of descending duodenum obstruction by an infiltrating process, resulting in a 3 cm long stricture extending from the postbulbar duodenum, accross papilla, into 2nd portion duodenum.  - Continue IV fluids, antiemetics and analgesia PRN.  Acute respiratory failure due to probable HCAP - no significant hypoxia but she is short of breath. Likely due to pneumonia.  - because recent hospitalization will start HCAP coverage Hypothyroidism - Continue synthroid  Colon cancer metastasized to liver Has biliary drain, IR consulted Severe protein calorie malnutrition  Secondary to history of progressive malignancy. Continue megace.  Hypokalemia Secondary to GI losses. Replete.  Anemia of chronic disease Secondary to history of malignancy  Leukocytosis Secondary to possible pneumonia  Diet: clears Fluids: NS 75 cc/h DVT Prophylaxis: SCD  Code Status: Full Family Communication: husband bedside  Disposition Plan: inpatient  Consultants:  Oncology  Surgery  IR  Procedures:  none   Antibiotics Levofloxacin 4/7 >> 4/8 Vancomycin 4/8 >> Zosyn 4/8 >>  HPI/Subjective: Feeling fatigued, hungry  Objective: Filed Vitals:   04-Jan-2014 2000 12/17/13 0000 12/17/13 0400 12/17/13 0620  BP: 142/64 93/49 82/46  87/47  Pulse: 106 104 104 101  Temp: 97.8 F (36.6 C)     TempSrc: Oral     Resp: 24 21 16 15   Height:      Weight:      SpO2: 99% 99% 99% 99%    Intake/Output Summary (Last 24 hours) at 12/17/13 0746 Last data filed at 12/17/13 0700  Gross per 24 hour  Intake    1450 ml  Output    900 ml  Net    550 ml   Filed Weights   01-04-2014 1000  Weight: 53.887 kg (118 lb 12.8 oz)   Exam:  General:  NAD, drowsy, icteric  Cardiovascular: regular rate and rhythm, without MRG  Respiratory: good air movement, clear to auscultation throughout, no wheezing, ronchi or rales  Abdomen: soft, mild tenderness RUQ  MSK: trace peripheral edema  Neuro: non focal  Data Reviewed: Basic Metabolic Panel:  Recent Labs Lab 01-04-14 0540 Jan 04, 2014 1135 12/17/13 0615  NA 140 145 139  K 3.1* 3.9 4.2  CL 91* 94* 96  CO2 30 33* 31  GLUCOSE 144* 130* 182*  BUN 13 15 33*  CREATININE 0.31* 0.36* 1.00  CALCIUM 10.2 9.7 8.8  MG  --  1.8  --   PHOS  --  4.6  --    Liver Function Tests:  Recent Labs Lab January 04, 2014 0540 2014/01/04 1135  AST 84* 230*  ALT 31 66*  ALKPHOS 277* 258*  BILITOT 5.9* 5.7*  PROT 9.1* 8.9*  ALBUMIN 2.2* 2.2*    Recent Labs Lab 01/04/2014 0540  LIPASE 45   CBC:  Recent Labs Lab Jan 04, 2014 0540 January 04, 2014 1135 12/17/13 0615  WBC 19.6* 18.9* 19.6*  NEUTROABS  --  16.5*  --   HGB 10.2* 10.1* 9.5*  HCT 30.8* 30.4* 29.3*  MCV 87.3 86.9 89.1  PLT 393 413* 397   CBG:  Recent Labs Lab 01/04/2014 0938 2014-01-04 2226 January 04, 2014 2358 12/17/13 0340  GLUCAP 145* 133* 150* 162*    Recent Results (  from the past 240 hour(s))  MRSA PCR SCREENING     Status: Abnormal   Collection Time    12/14/2013  9:35 AM      Result Value Ref Range Status   MRSA by PCR POSITIVE (*) NEGATIVE Final   Comment:            The GeneXpert MRSA Assay (FDA     approved for NASAL specimens     only), is one component of a     comprehensive MRSA colonization     surveillance program. It is not     intended to diagnose MRSA     infection nor to guide or     monitor treatment for     MRSA infections.     RESULT CALLED TO, READ BACK BY AND VERIFIED WITH:     Lady Deutscher RN [id number] 12/18/2013 A NAVARRO     Studies: Dg Abd Acute W/chest  01/06/2014   CLINICAL  DATA:  Abdominal pain, nausea and vomiting.  EXAM: ACUTE ABDOMEN SERIES (ABDOMEN 2 VIEW & CHEST 1 VIEW)  COMPARISON:  Chest radiograph performed 05/16/2011, and CT of the abdomen and pelvis performed 11/20/2013  FINDINGS: The lungs are hypoexpanded. Hazy right-sided airspace opacity is noted. A new 3.4 cm masslike density is noted at the left midlung zone. Vascular congestion is noted. No definite pleural effusion or pneumothorax is seen. The cardiomediastinal silhouette is within normal limits. A left-sided chest port is noted ending about the cavoatrial junction.  The bowel gas pattern is difficult to assess due to limitations in positioning. Decubitus views demonstrate apparent air-fluid levels within small and large bowel loops, raising concern for either ileus or obstruction. A drainage catheter and stent are noted at the right abdomen. No free intra-abdominal air is identified on the provided decubitus view.  Left convex thoracic and right convex lumbar scoliosis is noted. No acute osseous abnormalities are seen.  IMPRESSION: 1. Apparent air-fluid levels within small and large bowel loops, raising concern for either mild ileus or some degree of small bowel obstruction. No free intra-abdominal air seen. 2. Hazy right-sided airspace opacity noted; this could reflect mild pneumonia. 3. New 3.4 cm masslike density at the left midlung zone. Malignancy cannot be excluded; this could reflect metastatic disease, given the patient's history of metastatic colon cancer.   Electronically Signed   By: Garald Balding M.D.   On: 12/24/2013 05:52    Scheduled Meds: . amitriptyline  50-75 mg Oral QPM  . chlorhexidine  15 mL Mouth/Throat BID  . docusate sodium  100 mg Oral BID  . FLUoxetine  20 mg Oral q morning - 10a  . levofloxacin (LEVAQUIN) IV  750 mg Intravenous Q24H  . levothyroxine  150 mcg Oral QAC breakfast  . megestrol  200 mg Oral BID  . morphine  30 mg Oral Q12H  . propranolol  80 mg Oral Daily    Continuous Infusions: . sodium chloride 75 mL/hr at 12/22/2013 2106    Active Problems:   Hypothyroidism   Hypertension   Gastric outlet obstruction   Colon cancer metastasized to liver   Anemia of chronic disease   Protein-calorie malnutrition, severe   Vomiting   Respiratory failure   HCAP (healthcare-associated pneumonia)   Time spent: 35  This note has been created with Surveyor, quantity. Any transcriptional errors are unintentional.   Marzetta Board, MD [facility] Pager [phone]. If 7 PM - 7 AM, please contact night-coverage at  [email or web address], password [email or web address] 12/17/2013, 7:46 AM  LOS: 1 day

## 2013-12-17 NOTE — Progress Notes (Signed)
Subjective: Patient with improved biliary drain output, denies any recent vomiting episodes, has not had any liquid or food yet. She denies any fever or chills.   Objective: Physical Exam: BP 89/48  Pulse 103  Temp(Src) 98.6 F (37 C) (Oral)  Resp 13  Ht 5' (1.524 m)  Wt 125 lb 10.6 oz (57 kg)  BMI 24.54 kg/m2  SpO2 98%  General: Resting in bed, NAD Abdomen: Soft, non-tender, non distended, biliary drain catheter dressing C/D/I, no evidence of bleeding or skin breakdown, bag with dark green/brown bile, 24 hr output 900 cc, afebrile.   Labs: CBC  Recent Labs  12/15/2013 1135 12/17/13 0615  WBC 18.9* 19.6*  HGB 10.1* 9.5*  HCT 30.4* 29.3*  PLT 413* 397   BMET  Recent Labs  12/15/2013 1135 12/17/13 0615  NA 145 139  K 3.9 4.2  CL 94* 96  CO2 33* 31  GLUCOSE 130* 182*  BUN 15 33*  CREATININE 0.36* 1.00  CALCIUM 9.7 8.8   LFT  Recent Labs  12/12/2013 0540  12/17/13 0615  PROT 9.1*  < > 7.4  ALBUMIN 2.2*  < > 1.8*  AST 84*  < > 2451*  ALT 31  < > 602*  ALKPHOS 277*  < > 287*  BILITOT 5.9*  < > 4.9*  LIPASE 45  --   --   < > = values in this interval not displayed. PT/INR  Recent Labs  01/03/2014 1135  LABPROT 21.3*  INR 1.91*     Studies/Results: Dg Abd Acute W/chest  12/21/2013   CLINICAL DATA:  Abdominal pain, nausea and vomiting.  EXAM: ACUTE ABDOMEN SERIES (ABDOMEN 2 VIEW & CHEST 1 VIEW)  COMPARISON:  Chest radiograph performed 05/16/2011, and CT of the abdomen and pelvis performed 11/20/2013  FINDINGS: The lungs are hypoexpanded. Hazy right-sided airspace opacity is noted. A new 3.4 cm masslike density is noted at the left midlung zone. Vascular congestion is noted. No definite pleural effusion or pneumothorax is seen. The cardiomediastinal silhouette is within normal limits. A left-sided chest port is noted ending about the cavoatrial junction.  The bowel gas pattern is difficult to assess due to limitations in positioning. Decubitus views demonstrate  apparent air-fluid levels within small and large bowel loops, raising concern for either ileus or obstruction. A drainage catheter and stent are noted at the right abdomen. No free intra-abdominal air is identified on the provided decubitus view.  Left convex thoracic and right convex lumbar scoliosis is noted. No acute osseous abnormalities are seen.  IMPRESSION: 1. Apparent air-fluid levels within small and large bowel loops, raising concern for either mild ileus or some degree of small bowel obstruction. No free intra-abdominal air seen. 2. Hazy right-sided airspace opacity noted; this could reflect mild pneumonia. 3. New 3.4 cm masslike density at the left midlung zone. Malignancy cannot be excluded; this could reflect metastatic disease, given the patient's history of metastatic colon cancer.   Electronically Signed   By: Garald Balding M.D.   On: 12/24/2013 05:52    Assessment/Plan: Metastatic colon cancer with biliary obstruction s/p internal/external biliary drain last exchanged 12F with sideholes 11/14/13 Admitted with abdominal pain, nausea/vomiting, decreased drain output with total bilirubin 5.9, after flushing drain output improved, bili now 4.9 Will arrange for biliary drain exchange prior to discharge, will need to be NPO for sedation.    LOS: 1 day    Hedy Jacob PA-C 12/17/2013 12:32 PM

## 2013-12-18 ENCOUNTER — Inpatient Hospital Stay (HOSPITAL_COMMUNITY): Payer: Medicare HMO

## 2013-12-18 LAB — CBC
HCT: 25.7 % — ABNORMAL LOW (ref 36.0–46.0)
Hemoglobin: 8 g/dL — ABNORMAL LOW (ref 12.0–15.0)
MCH: 28.3 pg (ref 26.0–34.0)
MCHC: 31.1 g/dL (ref 30.0–36.0)
MCV: 90.8 fL (ref 78.0–100.0)
Platelets: 321 10*3/uL (ref 150–400)
RBC: 2.83 MIL/uL — ABNORMAL LOW (ref 3.87–5.11)
RDW: 18.7 % — ABNORMAL HIGH (ref 11.5–15.5)
WBC: 19.1 10*3/uL — ABNORMAL HIGH (ref 4.0–10.5)

## 2013-12-18 LAB — GLUCOSE, CAPILLARY
GLUCOSE-CAPILLARY: 130 mg/dL — AB (ref 70–99)
GLUCOSE-CAPILLARY: 86 mg/dL (ref 70–99)
Glucose-Capillary: 113 mg/dL — ABNORMAL HIGH (ref 70–99)

## 2013-12-18 LAB — AMMONIA: Ammonia: 209 umol/L — ABNORMAL HIGH (ref 11–60)

## 2013-12-18 LAB — COMPREHENSIVE METABOLIC PANEL
ALT: 752 U/L — ABNORMAL HIGH (ref 0–35)
AST: 2270 U/L — ABNORMAL HIGH (ref 0–37)
Albumin: 1.5 g/dL — ABNORMAL LOW (ref 3.5–5.2)
Alkaline Phosphatase: 346 U/L — ABNORMAL HIGH (ref 39–117)
BUN: 50 mg/dL — ABNORMAL HIGH (ref 6–23)
CO2: 24 mEq/L (ref 19–32)
Calcium: 6.8 mg/dL — ABNORMAL LOW (ref 8.4–10.5)
Chloride: 99 mEq/L (ref 96–112)
Creatinine, Ser: 1.8 mg/dL — ABNORMAL HIGH (ref 0.50–1.10)
GFR calc Af Amer: 34 mL/min — ABNORMAL LOW (ref >90)
GFR calc non Af Amer: 30 mL/min — ABNORMAL LOW (ref >90)
Glucose, Bld: 103 mg/dL — ABNORMAL HIGH (ref 70–99)
Potassium: 4.2 mEq/L (ref 3.7–5.3)
Sodium: 143 mEq/L (ref 137–147)
Total Bilirubin: 4.8 mg/dL — ABNORMAL HIGH (ref 0.3–1.2)
Total Protein: 6 g/dL (ref 6.0–8.3)

## 2013-12-18 LAB — LACTIC ACID, PLASMA: Lactic Acid, Venous: 7 mmol/L — ABNORMAL HIGH (ref 0.5–2.2)

## 2013-12-18 MED ORDER — NOREPINEPHRINE BITARTRATE 1 MG/ML IJ SOLN
2.0000 ug/min | INTRAVENOUS | Status: DC
  Administered 2013-12-18 – 2013-12-19 (×4): 50 ug/min via INTRAVENOUS
  Filled 2013-12-18 (×5): qty 16

## 2013-12-18 MED ORDER — SODIUM CHLORIDE 0.9 % IV BOLUS (SEPSIS)
1000.0000 mL | Freq: Once | INTRAVENOUS | Status: AC
  Administered 2013-12-18: 1000 mL via INTRAVENOUS

## 2013-12-18 MED ORDER — NOREPINEPHRINE BITARTRATE 1 MG/ML IJ SOLN
2.0000 ug/min | INTRAVENOUS | Status: DC
  Administered 2013-12-18: 15 ug/min via INTRAVENOUS
  Filled 2013-12-18: qty 4

## 2013-12-18 MED ORDER — SODIUM CHLORIDE 0.9 % IV BOLUS (SEPSIS): 1000.0000 mL | Freq: Once | INTRAVENOUS | Status: AC

## 2013-12-18 MED ORDER — SODIUM CHLORIDE 0.9 % IV BOLUS (SEPSIS): 500.0000 mL | Freq: Once | INTRAVENOUS | Status: DC

## 2013-12-18 MED ORDER — MORPHINE SULFATE 2 MG/ML IJ SOLN
2.0000 mg | INTRAMUSCULAR | Status: DC | PRN
  Administered 2013-12-18 – 2013-12-19 (×4): 2 mg via INTRAVENOUS
  Filled 2013-12-18 (×4): qty 1

## 2013-12-18 MED ORDER — SODIUM CHLORIDE 0.9 % IV BOLUS (SEPSIS)
500.0000 mL | Freq: Once | INTRAVENOUS | Status: AC
  Administered 2013-12-18: 500 mL via INTRAVENOUS

## 2013-12-18 MED ORDER — VANCOMYCIN HCL 500 MG IV SOLR
500.0000 mg | INTRAVENOUS | Status: DC
  Administered 2013-12-19: 500 mg via INTRAVENOUS
  Filled 2013-12-18: qty 500

## 2013-12-18 NOTE — Procedures (Signed)
Arterial Catheter Insertion Procedure Note April Spence [id number] [birth year]  Procedure: Insertion of Arterial Catheter  Indications: Blood pressure monitoring  Procedure Details Consent: Risks of procedure as well as the alternatives and risks of each were explained to the (patient/caregiver).  Consent for procedure obtained. Time Out: Verified patient identification, verified procedure, site/side was marked, verified correct patient position, special equipment/implants available, medications/allergies/relevent history reviewed, required imaging and test results available.  Performed  Maximum sterile technique was used including antiseptics, cap, gloves, gown, hand hygiene, mask and sheet. Skin prep: Chlorhexidine; local anesthetic administered 20 gauge catheter was inserted into left radial artery using the Seldinger technique.  Evaluation Blood flow good; BP tracing good. Complications: No apparent complications.   April Spence 12/18/2013

## 2013-12-18 NOTE — Progress Notes (Signed)
PROGRESS NOTE  April Spence YDX:[id number] DOB: [birth year] DOA: 12/22/2013 PCP: Alesia Richards, MD  Assessment/Plan: Septic shock - likely due to ?cholangitis/HCAP on a background of metastatic cancer, malnutrition and progressive weakness and decline; persistent hypotension overnight, added Levophed this morning. PCCM consulted, appreciate input.  - patient looks that she is actively dying and after discussions with her husband by PCCM, Dr. Benay Spice and myself, will not add more pressor support, patient is a no code, no CPR, no intubation.  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  Cholangitis  Acute respiratory failure due to probable HCAP - no significant hypoxia but she is short of breath.  Hypothyroidism - Continue synthroid  Colon cancer metastasized to liver Has biliary drain, IR consulted Severe protein calorie malnutrition  Secondary to history of progressive malignancy. Continue megace.  Hypokalemia Secondary to GI losses. Replete.  Anemia of chronic disease Secondary to history of malignancy  Leukocytosis Secondary to possible pneumonia  Progressive decline in the past few months with a rapid decline in the hospital in the past 2 days. Very guarded prognosis.   Diet: clears Fluids: NS 75 cc/h DVT Prophylaxis: SCD  Code Status: Full Family Communication: husband bedside  Disposition Plan: inpatient  Consultants:  Oncology  Surgery  IR  Procedures:  none   Antibiotics Levofloxacin 4/7 >> 4/8 Vancomycin 4/8 >> Zosyn 4/8 >>  HPI/Subjective: Feeling fatigued, drowsy  Objective: Filed Vitals:   12/18/13 0450 12/18/13  0500 12/18/13 0504 12/18/13 0530  BP:  83/41 84/54 85/44   Pulse: 95 95  96  Temp:      TempSrc:      Resp: 24 26  17   Height:      Weight:      SpO2: 100% 100%  100%    Intake/Output Summary (Last 24 hours) at 12/18/13 0736 Last data filed at 12/18/13 0700  Gross per 24 hour  Intake 6994.17 ml  Output   3901 ml  Net 3093.17 ml   Filed Weights   12/11/2013 1000 12/17/13 1211  Weight: 53.887 kg (118 lb 12.8 oz) 57 kg (125 lb 10.6 oz)   Exam:  General:  More lethargic today, confused  Cardiovascular: regular rate and rhythm, without MRG  Respiratory: good air movement, clear to auscultation throughout, no wheezing, ronchi or rales  Abdomen: soft, mild tenderness RUQ  MSK: trace peripheral edema  Neuro: non focal  Data Reviewed: Basic Metabolic Panel:  Recent Labs Lab 01/01/2014 0540 12/15/2013 1135 12/17/13 0615 12/18/13 0405  NA 140 145 139 143  K 3.1* 3.9 4.2 4.2  CL 91* 94* 96 99  CO2 30 33* 31 24  GLUCOSE 144* 130* 182* 103*  BUN 13 15 33* 50*  CREATININE 0.31* 0.36* 1.00 1.80*  CALCIUM 10.2 9.7 8.8 6.8*  MG  --  1.8  --   --   PHOS  --  4.6  --   --    Liver Function Tests:  Recent Labs Lab 01/07/2014 0540 01/07/2014 1135 12/17/13 0615 12/18/13 0405  AST 84* 230* 2451* 2270*  ALT 31 66* 602* 752*  ALKPHOS 277* 258* 287* 346*  BILITOT 5.9* 5.7* 4.9* 4.8*  PROT 9.1* 8.9* 7.4 6.0  ALBUMIN  2.2* 2.2* 1.8* 1.5*    Recent Labs Lab 12/24/2013 0540  LIPASE 45   CBC:  Recent Labs Lab 12/27/2013 0540 01/01/2014 1135 12/17/13 0615 12/18/13 0405  WBC 19.6* 18.9* 19.6* 19.1*  NEUTROABS  --  16.5*  --   --   HGB 10.2* 10.1* 9.5* 8.0*  HCT 30.8* 30.4* 29.3* 25.7*  MCV 87.3 86.9 89.1 90.8  PLT 393 413* 397 321   CBG:  Recent Labs Lab 12/17/13 0800 12/17/13 1210 12/17/13 1615 12/17/13 1952 12/17/13 2305  GLUCAP 156* 149* 160* 153* 130*    Recent Results (from the past 240 hour(s))  MRSA PCR SCREENING     Status: Abnormal   Collection Time     12/22/2013  9:35 AM      Result Value Ref Range Status   MRSA by PCR POSITIVE (*) NEGATIVE Final   Comment:            The GeneXpert MRSA Assay (FDA     approved for NASAL specimens     only), is one component of a     comprehensive MRSA colonization     surveillance program. It is not     intended to diagnose MRSA     infection nor to guide or     monitor treatment for     MRSA infections.     RESULT CALLED TO, READ BACK BY AND VERIFIED WITH:     Lady Deutscher RN [id number] 01/04/2014 A NAVARRO     Studies: No results found.  Scheduled Meds: . amitriptyline  50-75 mg Oral QPM  . chlorhexidine  15 mL Mouth/Throat BID  . docusate sodium  100 mg Oral BID  . FLUoxetine  20 mg Oral q morning - 10a  . levothyroxine  150 mcg Oral QAC breakfast  . megestrol  200 mg Oral BID  . morphine  30 mg Oral Q12H  . piperacillin-tazobactam (ZOSYN)  IV  3.375 g Intravenous Q8H  . propranolol  80 mg Oral Daily  . vancomycin  500 mg Intravenous Q12H   Continuous Infusions: . sodium chloride 100 mL/hr at 12/18/13 0514  . norepinephrine (LEVOPHED) Adult infusion 25 mcg/min (12/18/13 0720)    Active Problems:   Hypothyroidism   Hypertension   Gastric outlet obstruction   Colon cancer metastasized to liver   Anemia of chronic disease   Protein-calorie malnutrition, severe   Vomiting   Respiratory failure   HCAP (healthcare-associated pneumonia)  Time spent: 35  This note has been created with Surveyor, quantity. Any transcriptional errors are unintentional.   Marzetta Board, MD [facility] Pager [phone]. If 7 PM - 7 AM, please contact night-coverage at [email or web address], password [facility] 12/18/2013, 7:36 AM  LOS: 2 days

## 2013-12-18 NOTE — Progress Notes (Signed)
Pt with b/p of 77/44. Midlevel paged awaiting call back.

## 2013-12-18 NOTE — Progress Notes (Addendum)
ANTIBIOTIC CONSULT NOTE - Follow-up  Pharmacy Consult for Vancomycin, Zosyn Indication: cholangitis, PNA  Allergies  Allergen Reactions  . Adhesive [Tape]     Pt is fine with paper tape  . Codeine Nausea And Vomiting    Pt denies this    Patient Measurements: Height: 5' (152.4 cm) Weight: 125 lb 10.6 oz (57 kg) IBW/kg (Calculated) : 45.5  Vital Signs: Temp: 97.6 F (36.4 C) (04/09 0343) Temp src: Oral (04/09 0343) BP: 85/44 mmHg (04/09 0530) Pulse Rate: 96 (04/09 0530) Intake/Output from previous day: 04/08 0701 - 04/09 0700 In: 0623.7 [I.V.:1744.2; IV Piggyback:5250] Out: 3901 [Urine:1; Drains:3900] Intake/Output from this shift:    Labs:  Recent Labs  01/01/2014 1135 12/17/13 0615 12/18/13 0405  WBC 18.9* 19.6* 19.1*  HGB 10.1* 9.5* 8.0*  PLT 413* 397 321  CREATININE 0.36* 1.00 1.80*   Estimated Creatinine Clearance: 26.6 ml/min (by C-G formula based on Cr of 1.8). No results found for this basename: VANCOTROUGH, Corlis Leak, VANCORANDOM, [place], GENTPEAK, GENTRANDOM, TOBRATROUGH, TOBRAPEAK, TOBRARND, AMIKACINPEAK, AMIKACINTROU, AMIKACIN,  in the last 72 hours   Microbiology: Recent Results (from the past 720 hour(s))  MRSA PCR SCREENING     Status: Abnormal   Collection Time    12/28/2013  9:35 AM      Result Value Ref Range Status   MRSA by PCR POSITIVE (*) NEGATIVE Final   Comment:            The GeneXpert MRSA Assay (FDA     approved for NASAL specimens     only), is one component of a     comprehensive MRSA colonization     surveillance program. It is not     intended to diagnose MRSA     infection nor to guide or     monitor treatment for     MRSA infections.     RESULT CALLED TO, READ BACK BY AND VERIFIED WITH:     Lady Deutscher RN [phone] 12/18/2013 A NAVARRO    Medical History: Past Medical History  Diagnosis Date  . colon ca dx'd 04/2009    chemo comp 11/2009.. colon and liver  . Spina bifida   . Scoliosis   . Hyperlipidemia   . Foot drop    bilateral,wears braces  . Mitral valve prolapse   . Hypothyroidism   . Depression   . History of chemotherapy     5-FU and Avastin 12 cycles FOLFOX  . Colon cancer 05/06/2010    metastatic  . Hepatic metastases 08/15/12    per ct abdomen  . Radiation 10/21/12-10/28/12    Palliative liver mets 52 gray in 3 fx's  . Arrhythmia 02-26-13    "rapid heart rate occ."  . History of radiation therapy 10/21/2012-10/28/2012    52 gray to liver metastasis  . History of radiation therapy 04/07/2013-04/24/2013    43 gray to liver metastasis  . 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  She was recently admitted last month for similar complaints.  Pharmacy consulted to dose Vancomycin and Zosyn for HCAP coverage and cholangitis.  Surgery, IR, and Oncology following patient.   Afebrile  WBC 19.1  SCr 1.8, increased significantly again today.  CrCl ~37 ml/min (normalized), ~30 ml/min (CG, TBW = 57 kg)   Goal of Therapy:  Vancomycin trough level 15-20 mcg/ml Doses adjusted per renal function  Plan:  1.  Adjust Vancomycin dosing to Vancomycin 500 mg IV q24h due to worsening renal function. 2.  Zosyn 3.375g IV q8h (4 hour infusion time). 3.  F/u SCr, culture results, clinical course.  Julio Sicks, PharmD 12/18/2013,8:25 AM

## 2013-12-18 NOTE — Progress Notes (Signed)
Pt with b/p of 80/49 midlevel paged awaiting call back.

## 2013-12-18 NOTE — Progress Notes (Signed)
Called RT to place arterial line.

## 2013-12-18 NOTE — Progress Notes (Signed)
Pt with b/p of 66/34. Called midlevel awaiting call back.

## 2013-12-18 NOTE — Progress Notes (Signed)
IP PROGRESS NOTE  Subjective:   She developed hypotension and confusion last night. She is now on Levophed and remains confused. Her husband is at the bedside.  Objective: Vital signs in last 24 hours: Blood pressure 107/44, pulse 110, temperature 99.4 F (37.4 C), temperature source Axillary, resp. rate 18, height 5' (1.524 m), weight 125 lb 10.6 oz (57 kg), SpO2 99.00%.  Intake/Output from previous day: 04/08 0701 - 04/09 0700 In: 6994.2 [I.V.:1744.2; IV Piggyback:5250] Out: 3901 [Urine:1; Drains:3900]  Physical Exam:  Lungs: Scattered rhonchi, no respiratory distress Cardiac: Regular rate and rhythm Abdomen: No mass, nontender, biliary drain site without evidence of infection Neurologic: Alert, confused  Portacath/PICC-without erythema  Lab Results:  Recent Labs  12/17/13 0615 12/18/13 0405  WBC 19.6* 19.1*  HGB 9.5* 8.0*  HCT 29.3* 25.7*  PLT 397 321    BMET  Recent Labs  12/17/13 0615 12/18/13 0405  NA 139 143  K 4.2 4.2  CL 96 99  CO2 31 24  GLUCOSE 182* 103*  BUN 33* 50*  CREATININE 1.00 1.80*  CALCIUM 8.8 6.8*    Studies/Results: Dg Chest Port 1 View  12/18/2013   CLINICAL DATA:  Colon cancer.  EXAM: PORTABLE CHEST - 1 VIEW  COMPARISON:  DG ABD ACUTE W/CHEST dated 12/11/2013; NM PET TUM IMG RESTAG (PS) SKULL BASE T - THIGH dated 08/12/2013  FINDINGS: Power port noted with tip projected over cavoatrial junction. Mediastinum and hilar structures are unremarkable. Mild cardiomegaly and pulmonary venous congestion. Density over left mid lung field again noted. This is less obvious on today's exam. No pleural effusion or pneumothorax. Severe thoracic spine scoliosis .  IMPRESSION: 1. Power port noted with tip in stable position. 2. Mild cardiomegaly and pulmonary venous congestion. Low lung volumes. 3. Previously identified density left mid lung field remains but had improved. This suggests improving atelectasis.   Electronically Signed   By: Marcello Moores  Register    On: 12/18/2013 08:01   Dg Abd Portable 2v  12/18/2013   CLINICAL DATA:  Ileus versus bowel obstruction, followup  EXAM: PORTABLE ABDOMEN - 2 VIEW  COMPARISON:  Abdomen films of 12/12/2013  FINDINGS: Portable views of the abdomen including supine and left lateral decubitus show air-fluid levels probably in the right colon. However no definite bowel obstruction is seen. No free air is noted on the decubitus image. A percutaneous catheter overlies the right abdomen. Probable calcified uterine fibroid is noted in the mid pelvis. The bones are osteopenic with lumbar scoliosis noted.  IMPRESSION: No definite bowel obstruction. Air-fluid level primarily in the colon. No free air.   Electronically Signed   By: Ivar Drape M.D.   On: 12/18/2013 08:02    Medications: I have reviewed the patient's current medications.  Assessment/Plan:  1. 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 She was scheduled to begin salvage therapy with CAPOX 12/12/2013  2. Spina bifida with severe scoliosis  3. 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 Admission 12/26/2013 with nausea/vomiting-most likely related to a partial small bowel obstruction versus biliary obstruction versus cholangitis  6. Acute elevation of the liver enzymes-? Cholangitis, sepsis  7. Hypotension  8. renal  She has developed  sepsis syndrome with hypotension, and elevated lactic acid, and renal failure. This is in the setting of metastatic colon cancer. I discussed the situation with her husband. She will be placed on a no CODE BLUE status. He understands it is unlikely she will  survive this admission.  Recommendations: 1. antibiotics/supportive care per medicine and critical care services 2. morphine as needed for comfort 3. Please call oncology as needed.  I will check in with her husband later today. I will be out starting December 21, 2013 until 12/22/2013.     LOS: 2 days   Ladell Pier  12/18/2013, 2:40 PM

## 2013-12-18 NOTE — Progress Notes (Signed)
Pt now with art pressure of 63/39 midlevel called awaiting call back.

## 2013-12-18 NOTE — Progress Notes (Signed)
Pt in room 1236 b/p of 71/40 called midlevel. Pt is more confused this am per pt's husband. Will continue to monitor.

## 2013-12-18 NOTE — Progress Notes (Signed)
Pt with b/p of 68/36 midlevel paged awaiting cal back.

## 2013-12-18 NOTE — Consult Note (Signed)
PULMONARY / CRITICAL CARE MEDICINE   Name: April Spence MRN: [id number] DOB: 1956-04-08    ADMISSION DATE:  12/18/2013 CONSULTATION DATE:  12/18/13  REFERRING MD :  Dr. 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    SIGNIFICANT EVENTS: 4/7 - Admit with NV, abd pain with gastric outlet obstruction    STUDIES:  4/7 KUB w chest>>concern for ileus / SBO, hazy R airspace opacity, new 3.4 cm masslike density in L mid lung  LINES / TUBES: Port-a-Cath 04/03/13>>> Aline 4/9>>> Biliary Drain (inserted 03/2013, changed 11/14/13)>>>  CULTURES: MRSA PCR 4/9>>>Positive  ANTIBIOTICS: Vanco 4/8 >>> Zosyn 4/8 >>>  HISTORY OF PRESENT ILLNESS:  58 y/o F with PMH of spina bifida, scoliosis, foot drop (uses braces), HTN, HLD, depression and colon cancer (diagnosed in 2011, s/p radiation 10/2012, 7-04/2013, chemo for 12 cycles) who presented 4/7 with abdominal pain, N/V with Gastric outlet obstruction secondary to 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  She was to begin salvage chemotherapy on 4/7.  On presentation she c/o RUQ abd pain and that her biliary drain had decreased output.  Drain was originally inserted in July of 2014 and last changed on 11/14/13.  She was hemodynamically stable & afebrile in ER.  Labs revealed WBC of 19, Hgb 10, K 3.1.  KUB with chest xray noted concern for ileus / SBO, R sided airspace opacity and new L midlung 3.4 cm mass like density raising concern for further metastatic disease & PNA.  Further imaging on 4/9 notes clearance of L mid-lung density supporting atelectasis.  Patient became hypotensive in the early am hours of 4/9 with associated confusion.  She was started on levophed per [facility].   KUB  with air-fluid levels in colon but no free air.  PCCM consulted 4/9 for further evaluation.       PAST MEDICAL HISTORY :  Past Medical History  Diagnosis Date  . colon ca dx'd 04/2009    chemo comp 11/2009.. colon and liver  . Spina bifida   . Scoliosis   . Hyperlipidemia   . Foot drop     bilateral,wears braces  . Mitral valve prolapse   . Hypothyroidism   . Depression   . History of chemotherapy     5-FU and Avastin 12 cycles FOLFOX  . Colon cancer 05/06/2010    metastatic  . Hepatic metastases 08/15/12    per ct abdomen  . Radiation 10/21/12-10/28/12    Palliative liver mets 54 gray in 3 fx's  . Arrhythmia 02-26-13    "rapid heart rate occ."  . History of radiation therapy 10/21/2012-10/28/2012    54 gray to liver metastasis  . History of radiation therapy 04/07/2013-04/24/2013    45 gray to liver metastasis  . Hypertension 11/19/2013   Past Surgical History  Procedure Laterality Date  . Cholecystectomy    . Back surgery  1980 and 2010  . Knee arthroscopy  2005    left  . Tubal ligation  1973  . Colonoscopy  05/05/2010  . Colon surgery      2010  . Portacath placement  05/30/2011    tip at cavoatrialjjunction by xray  . Ercp N/A 02/27/2013    Procedure: ENDOSCOPIC RETROGRADE CHOLANGIOPANCREATOGRAPHY (ERCP);  Surgeon: Milus Banister, MD;  Location: [facility];  Service: Endoscopy;  Laterality: N/A;  . Biliary stent placement N/A 02/27/2013    Procedure: BILIARY STENT PLACEMENT;  Surgeon: Milus Banister, MD;  Location: [facility];  Service: Endoscopy;  Laterality: N/A;  . Percutaneous transhepatic cholangiograhpy and billiary drainage  03/21/2013  . Dental work  09/2013  . Esophagogastroduodenoscopy N/A 11/21/2013    Procedure: ESOPHAGOGASTRODUODENOSCOPY (EGD);  Surgeon: Lafayette Dragon, MD;  Location: [facility];  Service: Endoscopy;  Laterality: N/A;   Prior to Admission medications   Medication Sig Start Date End Date Taking? Authorizing Provider  Alum & Mag  Hydroxide-Simeth (MAGIC MOUTHWASH) SOLN Take 10 mLs by mouth 4 (four) times daily as needed for mouth pain. Mouthsores 11/24/13  Yes Costin Karlyne Greenspan, MD  amitriptyline (ELAVIL) 25 MG tablet Take 2-3 tablets (50-75 mg total) by mouth every evening. As needed for chronic back pain 11/24/13  Yes Costin Karlyne Greenspan, MD  capecitabine (XELODA) 500 MG tablet Take 2 tablets by mouth (=1000 mg) in AM; Take 2 tablets by mouth (=1000 mg) in PM.  Total daily dose of 2000 mg for 14 days.  Start 01/06/2014 12/08/13  Yes Ladell Pier, MD  chlorhexidine (PERIDEX) 0.12 % solution Use as directed 15 mLs in the mouth or throat 2 (two) times daily. 11/24/13  Yes Costin Karlyne Greenspan, MD  Cholecalciferol (VITAMIN D-3) 5000 UNITS TABS Take 5,000 Units by mouth 2 (two) times daily.    Yes Historical Provider, MD  diphenoxylate-atropine (LOMOTIL) 2.5-0.025 MG per tablet Take 1 tablet by mouth 4 (four) times daily as needed for diarrhea or loose stools. 12/11/13  Yes Ladell Pier, MD  docusate sodium (COLACE) 100 MG capsule Take 1 capsule (100 mg total) by mouth 2 (two) times daily. 11/24/13  Yes Costin Karlyne Greenspan, MD  FLUoxetine (PROZAC) 20 MG capsule Take 1 capsule (20 mg total) by mouth every morning. 11/24/13  Yes Costin Karlyne Greenspan, MD  levothyroxine (SYNTHROID, LEVOTHROID) 150 MCG tablet Take 1 tablet (150 mcg total) by mouth daily before breakfast. 11/24/13  Yes Costin Karlyne Greenspan, MD  lidocaine-prilocaine (EMLA) cream Apply 1 application topically as needed (use on port).   Yes Historical Provider, MD  megestrol (MEGACE ORAL) 40 MG/ML suspension Take 5 mLs (200 mg total) by mouth 2 (two) times daily. 11/24/13  Yes Costin Karlyne Greenspan, MD  morphine (MS CONTIN) 30 MG 12 hr tablet Take 1 tablet (30 mg total) by mouth every 12 (twelve) hours. 12/08/13  Yes Ladell Pier, MD  ondansetron (ZOFRAN) 8 MG tablet Take 1 tablet (8 mg total) by mouth every 12 (twelve) hours as needed for nausea or vomiting. 11/24/13  Yes Costin Karlyne Greenspan, MD   oxyCODONE-acetaminophen (PERCOCET/ROXICET) 5-325 MG per tablet Take 2 tablets by mouth every 4 (four) hours as needed for moderate pain or severe pain. 12/08/13  Yes Ladell Pier, MD  prochlorperazine (COMPAZINE) 10 MG tablet Take 1 tablet (10 mg total) by mouth every 6 (six) hours as needed (nausea). 11/24/13  Yes Costin Karlyne Greenspan, MD  propranolol (INDERAL) 80 MG tablet Take 80 mg by mouth daily.   Yes Historical Provider, MD  traMADol-acetaminophen (ULTRACET) 37.5-325 MG per tablet Take 1 tablet by mouth every 6 (six) hours as needed for moderate pain. 11/24/13  Yes [place], MD   Allergies  Allergen Reactions  . Adhesive [Tape]     Pt is fine with paper tape  . Codeine Nausea And Vomiting    Pt denies this  FAMILY HISTORY:  Family History  Problem Relation Age of Onset  . Cancer Sister     breast  . Cancer Maternal Grandmother     breast  . Cancer Other 67    breast ca   SOCIAL HISTORY:  reports that she quit smoking about 7 years ago. She has never used smokeless tobacco. She reports that she does not drink alcohol or use illicit drugs.  REVIEW OF SYSTEMS:  Unable to complete as patient is altered.  Information obtained from family and previous medical documentation.   SUBJECTIVE:   VITAL SIGNS: Temp:  [97.3 F (36.3 C)-98.6 F (37 C)] 97.6 F (36.4 C) (04/09 0343) Pulse Rate:  [93-103] 96 (04/09 0530) Resp:  [13-27] 17 (04/09 0530) BP: (66-104)/(34-55) 85/44 mmHg (04/09 0530) SpO2:  [91 %-100 %] 100 % (04/09 0530) Arterial Line BP: (55-80)/(32-64) 79/41 mmHg (04/09 0628) Weight:  [125 lb 10.6 oz (57 kg)] 125 lb 10.6 oz (57 kg) (04/08 1211)  HEMODYNAMICS:    VENTILATOR SETTINGS:    INTAKE / OUTPUT: Intake/Output     04/08 0701 - 04/09 0700 04/09 0701 - 04/10 0700   I.V. (mL/kg) 1744.2 (30.6)    IV Piggyback 5250    Total Intake(mL/kg) 6994.2 (122.7)    Urine (mL/kg/hr) 1 (0)    Drains 3900 (2.9)    Total Output 3901     Net +3093.2             PHYSICAL EXAMINATION: General:  Ill appearing female, NAD Neuro:  Obtunded, briefly arouses but drifts back to sleep  HEENT:  Scleral icterus, poor dentition  Cardiovascular:  s1s2 rrr, no m/r/g Lungs:  resp's even/non-labored, lungs bilaterally clear Abdomen:  Round/soft, bsx4 hypoactive Musculoskeletal:  No acute deformities  Skin:  Warm/dry, jaundice   LABS:  CBC  Recent Labs Lab 12/18/13 1135 12/17/13 0615 12/18/13 0405  WBC 18.9* 19.6* 19.1*  HGB 10.1* 9.5* 8.0*  HCT 30.4* 29.3* 25.7*  PLT 413* 397 321   Coag's  Recent Labs Lab December 18, 2013 1135  APTT 26  INR 1.91*   BMET  Recent Labs Lab 12-18-2013 1135 12/17/13 0615 12/18/13 0405  NA 145 139 143  K 3.9 4.2 4.2  CL 94* 96 99  CO2 33* 31 24  BUN 15 33* 50*  CREATININE 0.36* 1.00 1.80*  GLUCOSE 130* 182* 103*   Electrolytes  Recent Labs Lab 2013-12-18 1135 12/17/13 0615 12/18/13 0405  CALCIUM 9.7 8.8 6.8*  MG 1.8  --   --   PHOS 4.6  --   --    Sepsis Markers  Recent Labs Lab 12/18/13 0700  LATICACIDVEN 7.0*   ABG No results found for this basename: PHART, PCO2ART, PO2ART,  in the last 168 hours Liver Enzymes  Recent Labs Lab 12-18-2013 1135 12/17/13 0615 12/18/13 0405  AST 230* 2451* 2270*  ALT 66* 602* 752*  ALKPHOS 258* 287* 346*  BILITOT 5.7* 4.9* 4.8*  ALBUMIN 2.2* 1.8* 1.5*   Cardiac Enzymes No results found for this basename: TROPONINI, PROBNP,  in the last 168 hours Glucose  Recent Labs Lab 12/17/13 0800 12/17/13 1210 12/17/13 1615 12/17/13 1952 12/17/13 2305 12/18/13 0827  GLUCAP 156* 149* 160* 153* 130* 86    Imaging Dg Chest Port 1 View  12/18/2013   CLINICAL DATA:  Colon cancer.  EXAM: PORTABLE CHEST - 1 VIEW  COMPARISON:  DG ABD ACUTE W/CHEST dated 2013/12/18; NM PET TUM IMG RESTAG (PS) SKULL BASE T - THIGH dated 08/12/2013  FINDINGS: Power  port noted with tip projected over cavoatrial junction. Mediastinum and hilar structures are unremarkable. Mild cardiomegaly  and pulmonary venous congestion. Density over left mid lung field again noted. This is less obvious on today's exam. No pleural effusion or pneumothorax. Severe thoracic spine scoliosis .  IMPRESSION: 1. Power port noted with tip in stable position. 2. Mild cardiomegaly and pulmonary venous congestion. Low lung volumes. 3. Previously identified density left mid lung field remains but had improved. This suggests improving atelectasis.   Electronically Signed   By: Marcello Moores  Register   On: 12/18/2013 08:01   Dg Abd Portable 2v  12/18/2013   CLINICAL DATA:  Ileus versus bowel obstruction, followup  EXAM: PORTABLE ABDOMEN - 2 VIEW  COMPARISON:  Abdomen films of 01/06/2014  FINDINGS: Portable views of the abdomen including supine and left lateral decubitus show air-fluid levels probably in the right colon. However no definite bowel obstruction is seen. No free air is noted on the decubitus image. A percutaneous catheter overlies the right abdomen. Probable calcified uterine fibroid is noted in the mid pelvis. The bones are osteopenic with lumbar scoliosis noted.  IMPRESSION: No definite bowel obstruction. Air-fluid level primarily in the colon. No free air.   Electronically Signed   By: Ivar Drape M.D.   On: 12/18/2013 08:02    ASSESSMENT / PLAN:  PULMONARY A: L Airspace Disease - clearing of cxr, likely atelectasis P:   -pulmonary hygiene as able -if distress, use morphine for dyspnea (currently comfortable)  CARDIOVASCULAR A:  Shock Tachycardia Hx HTN P:  -levophed at current rate, no further titration, no additional agents -DNR / DNI -hold home agents   RENAL A:   Acute Kidney Injury Hypokalemia  Elevated Lactic Acid P:   -supportive care, gentle hydration  GASTROINTESTINAL A:   Abdominal Pain Nausea / Vomiting Elevated LFT's Gastric Outlet Obstruction - s/p Biliary drain placement  Stage III Adenocarcinoma of Cecum with Metastasis to Liver - was scheduled to begin salvage therapy  on 4/7 with CAPOX Protein Calorie Malnutrition P:   -continue biliary drain, monitor output  -PRN zofran for nausea -allow palliative feeding as tolerated   HEMATOLOGIC / ONCOLOGIC  A:   Anemia of Chronic Disease P:  -montior CBC -chemotherapy on hold  INFECTIOUS A:   R/O Cholangitis  P:   -abx as above  ENDOCRINE A:   Hypothyroidism P:   -continue synthroid  NEUROLOGIC A:   Depression Spina Bifida / Scoliosis w associated Foot Drop P:   -supportive care   GLOBAL: 58 y/o F with metastatic adenocarcinoma.  Initial eval 4/9 am for hypotension. She was treated with levophed and is currently on max dose but blood pressure has continued to drop.  She also has declined in regards to mental status.  Unfortunately, she appears to be actively dying despite aggressive measures.  Husband is at bedside and has made decisions regarding EOL care.   He wishes for no CPR, No intubation or defibrillation.  We discussed adding a second agent for hypotension but he feels that would be unnecessarily prolonging her life.  We will continue levophed at current rate for now.  If any signs of discomfort, will progress toward comfort focused measures.    Noe Gens, NP-C [facility] Pgr: [phone] or [phone]   12/18/2013, 9:35 AM  Reviewed above, and examined pt.   Very unfortunate situation of 58 yo female with metastatic colon cancer and gastric outlet obstruction, and chronic biliary obstruction.  She has refractory  shock and acute encephalopathy.  Situation d/w family.  They are in agreement that she should not have escalation of care >> main focus should be on comfort.  Will keep current therapies in place for now, but concerned that she will not survive this hospital stay.  CC time 35 minutes.  Chesley Mires, MD [facility] Pulmonary/Critical Care 12/18/2013, 11:24 AM Pager:  [phone] After 3pm call: [phone]

## 2013-12-19 ENCOUNTER — Inpatient Hospital Stay (HOSPITAL_COMMUNITY): Payer: Medicare HMO

## 2013-12-19 DIAGNOSIS — K7200 Acute and subacute hepatic failure without coma: Secondary | ICD-10-CM

## 2013-12-19 DIAGNOSIS — N179 Acute kidney failure, unspecified: Secondary | ICD-10-CM

## 2013-12-19 DIAGNOSIS — A419 Sepsis, unspecified organism: Secondary | ICD-10-CM

## 2013-12-19 DIAGNOSIS — R6521 Severe sepsis with septic shock: Secondary | ICD-10-CM

## 2013-12-19 LAB — COMPREHENSIVE METABOLIC PANEL
ALBUMIN: 1.5 g/dL — AB (ref 3.5–5.2)
ALT: 1171 U/L — ABNORMAL HIGH (ref 0–35)
AST: 4018 U/L — ABNORMAL HIGH (ref 0–37)
Alkaline Phosphatase: 302 U/L — ABNORMAL HIGH (ref 39–117)
BILIRUBIN TOTAL: 6 mg/dL — AB (ref 0.3–1.2)
BUN: 61 mg/dL — ABNORMAL HIGH (ref 6–23)
CHLORIDE: 97 meq/L (ref 96–112)
CO2: 19 mEq/L (ref 19–32)
CREATININE: 2.68 mg/dL — AB (ref 0.50–1.10)
Calcium: 6.7 mg/dL — ABNORMAL LOW (ref 8.4–10.5)
GFR calc Af Amer: 21 mL/min — ABNORMAL LOW (ref >90)
GFR calc non Af Amer: 18 mL/min — ABNORMAL LOW (ref >90)
Glucose, Bld: 63 mg/dL — ABNORMAL LOW (ref 70–99)
Potassium: 4.3 mEq/L (ref 3.7–5.3)
Sodium: 145 mEq/L (ref 137–147)
Total Protein: 5.8 g/dL — ABNORMAL LOW (ref 6.0–8.3)

## 2013-12-19 LAB — CBC
HCT: 22.4 % — ABNORMAL LOW (ref 36.0–46.0)
HEMOGLOBIN: 7.5 g/dL — AB (ref 12.0–15.0)
MCH: 29.5 pg (ref 26.0–34.0)
MCHC: 33.5 g/dL (ref 30.0–36.0)
MCV: 88.2 fL (ref 78.0–100.0)
PLATELETS: 405 10*3/uL — AB (ref 150–400)
RBC: 2.54 MIL/uL — AB (ref 3.87–5.11)
RDW: 19.5 % — ABNORMAL HIGH (ref 11.5–15.5)
WBC: 24.5 10*3/uL — AB (ref 4.0–10.5)

## 2013-12-19 MED ORDER — LORAZEPAM 2 MG/ML IJ SOLN: 1.0000 mg | INTRAMUSCULAR | Status: DC | PRN

## 2013-12-19 MED ORDER — VANCOMYCIN HCL IN DEXTROSE 750-5 MG/150ML-% IV SOLN: 750.0000 mg | INTRAVENOUS | Status: DC

## 2013-12-19 MED ORDER — SODIUM CHLORIDE 0.9 % IV SOLN: INTRAVENOUS | Status: DC | PRN

## 2013-12-19 MED ORDER — ATROPINE SULFATE 1 % OP SOLN
4.0000 [drp] | OPHTHALMIC | Status: DC | PRN
  Filled 2013-12-19: qty 2

## 2013-12-19 MED ORDER — SODIUM CHLORIDE 0.9 % IV SOLN
1.0000 mg/h | INTRAVENOUS | Status: DC
  Administered 2013-12-19: 2 mg/h via INTRAVENOUS
  Filled 2013-12-19: qty 10

## 2013-12-19 MED ORDER — PIPERACILLIN-TAZOBACTAM IN DEX 2-0.25 GM/50ML IV SOLN
2.2500 g | Freq: Four times a day (QID) | INTRAVENOUS | Status: DC
  Filled 2013-12-19 (×2): qty 50

## 2013-12-19 MED ORDER — SODIUM CHLORIDE 0.9 % IJ SOLN: 10.0000 mL | INTRAMUSCULAR | Status: DC | PRN

## 2013-12-26 ENCOUNTER — Inpatient Hospital Stay (HOSPITAL_COMMUNITY): Admission: RE | Admit: 2013-12-26 | Payer: Medicare HMO | Source: Ambulatory Visit

## 2014-01-06 ENCOUNTER — Other Ambulatory Visit: Payer: Medicare HMO

## 2014-01-06 ENCOUNTER — Ambulatory Visit: Payer: Medicare HMO | Admitting: Oncology

## 2014-01-06 ENCOUNTER — Ambulatory Visit: Payer: Medicare HMO

## 2014-01-06 ENCOUNTER — Encounter: Payer: Medicare HMO | Admitting: Nutrition

## 2014-01-09 NOTE — Progress Notes (Signed)
Erich Montane RN and Lacinda Axon RN wasted 70 cc (70 mg ) of morphine after patient pronounced.  Wasted in sink.  Kendrix Orman Roselie Awkward RN

## 2014-01-09 NOTE — Progress Notes (Signed)
Pt typically on MS Contin 30 mg BID and oxycodone, now unable to take po's d/t decreased LOC.  On morphine 2mg  every hour PRN, but pt still grimacing with increased heart rate.  Husband stated that he would like something to help pt be more comfortable.  He spoke with Dr. Elsworth Soho and orders were given for morphine drip.  Continue to monitor.

## 2014-01-09 NOTE — Progress Notes (Signed)
eLink Physician-Brief Progress Note Patient Name: April Spence DOB: 01-13-1956 MRN: [id number]  Date of Service  12/13/2013   HPI/Events of Note  Breakthrough pain on int morphine  eICU Interventions  D/w husband Morphine gtt      Intervention Category Major Interventions: End of life / care limitation discussion  Rigoberto Noel 12/17/2013, 1:26 AM

## 2014-01-09 NOTE — Progress Notes (Signed)
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  Family told stories of April Spence's love, gentleness, and smiling perseverance through many challenges in life.  This act of storytelling and meaning-making is comforting and healing for family.  We prayed together at family's request, which family also found very meaningful.  Sisters express concern for husband April Spence's lack of sleep and appetite and plan to support him closely.  Family is aware of ongoing chaplain availability, as new needs and desires arise.  [place], [place]

## 2014-01-09 NOTE — Progress Notes (Signed)
At [phone] patient with no spont respiration, no heartbeat, and no pulse.  Family at bedside at aware that patient has passed.  Erich Montane RN and Lacinda Axon RN pronounced patient at [phone].  Husband and sister at bedside.  [place] notified of patient expiration.  Amyr Sluder Roselie Awkward RN

## 2014-01-09 NOTE — Progress Notes (Signed)
Event:  Multiple calls through-out shift regarding pt's persistent hypotension despite multiple IVF boluses. Husband now relates to RN his concern for wife's worsening confusion. NP to bedside.  Subjective: Pt denies any specific c/o's though states she just feels "real weak". Husband relates that pt has been increasingly confused since this am but he feels this is now worsening. Objective: April Spence is a 58 -year-old female w/ past medical history significant for metastatic colon cancer, biliary obstruction and stent placement w/ recent admission (3/11 until 3/16 with similar presentation) who  presented to [facility] ED 12/23/2013 with ongoing n/v since 1 day prior to this admission. She also noticed RUQ pain and her biliary drain did not have much output. She did not have fever or chills. Symptoms felt related to malfunctioning biliary drain and surgery was consulted. Pt now has had copius dark drainage from biliary drain as much as 5000-900cc per shift. At bedside pt noted resting in NAD. She awakens easily and is quite alert. She can answer some questions correctly but for the most part is very confused. Pt is afebrile, HR 115-120. Current BP 82/58. BBS CTA, skin is w/d, jaundice, abd is round, soft w/ minimal TTP and BS in all quadrants, PP's somewhat weak but palpable and extremities are warm to touch. Pt currently receiving her 3rd L NS. Assessment/Plan: 1. Persistent hypotension (and tachycardia) in setting of ill appearing female w/ metastatic colon cancer (involving liver)  s/p 12 cycles of chemo. Refractory to IVF boluses. Suspect septic shock related to cholangitis vs HCAP associated w/ metastatic illness, malnutrition and progressive decline. Will obtain CBC, C-Met, ammonia and lactate. Will continue IVF boluses as currently BP 104/68 w/ MAP of 80. Low threshold to consult CCM if uunable to maintain MAP of 60 or > w/ IVF's. Will continue IVF's for now and await labs. Will continue to monitor closely in  SDU.    Jeryl Columbia, NP-C [facility] Pager [phone]

## 2014-01-09 NOTE — Progress Notes (Signed)
Follow-up:  Notified by RN that pt's BP now 68/36 though there is some descrepency between cuff and manuel pressures. SBP manually is 88.  Pt currently receiving 4th L NS bolus. Spoke w/ husband regarding placement of A-Line and he is in agreement. Will place A-Line. I have discussed pt w/ Dr Elsworth Soho who agrees w/ placement of A-Line and has recommended continued volume, Levophed drip and repeat abd and chest films. Labs pending. CCM provider to see pt this am. Appreciate CCM input.  Jeryl Columbia, NP-C [facility] Pager [phone]

## 2014-01-09 NOTE — Progress Notes (Signed)
Met with family this morning.  April Spence is dying, and family has come to accept this.  Focus now is on maintaining her comfort during her final moments of life.  Will continue morphine, and add prn ativan and atropine drops.  All other therapies/tests discontinued.  PCCM can be available as needed.  Chesley Mires, MD [facility] Pulmonary/Critical Care 12-29-13, 9:19 AM Pager:  [phone] After 3pm call: [phone]

## 2014-01-09 NOTE — Discharge Summary (Signed)
Physician Death Summary  April Spence [id number] DOB: Feb 28, 1956 DOA: 12/12/2013  PCP: Alesia Richards, MD  Admit date: 12/15/2013 Discharge date: 12/30/2013  Time spent: 35 minutes   Discharge Diagnoses:  Active Problems:   Hypothyroidism   Hypertension   Gastric outlet obstruction   Colon cancer metastasized to liver   Anemia of chronic disease   Protein-calorie malnutrition, severe   Vomiting   Respiratory failure   HCAP (healthcare-associated pneumonia)   Acute renal failure   Acute liver failure   Septic shock   History of present illness:  58 -year-old female with past medical history significant for metastatic colon cancer, biliary obstruction and stent placement, recent admission (3/11 until 3/16 with similar presentation) who now presents to [facility] ED 12/13/2013 with ongoing nausea, vomiting started at 5 pm 1 day prior to this admission. She also noticed right upper quadrant abdominal pain and her biliary drain did not have much output. She did not have fever or chills. She had few episodes of diarrhea but this is now resolved. No lightheadedness. No chest pain, palpitations. She was not short of breath on admission but at the time of this interview she is little short of breath. Of note, her biliary catheter was placed by IR in July 2014, last changed 11/14/13.  In ED, vitals are stable with BP 158/84, HR 97, Tmax 98.7 F nad oxygen saturation 95% on 2 L [place]. Her respiratory status worsened a little which likely is due to uncontrolled pain. Oxygen saturation remains at 100%. Blood work revealed WBC count of 19.66, hemoglobin of 10, potassium 3.1. Acute abd imaging revealed possible mild ileus or some degree of small bowel obstruction, hazy right-sided airspace opacity which could reflect mild pneumonia and new 3.4 cm masslike density at the left midlung zone, malignancy cannot be excluded; this could reflect metastatic disease.   Hospital Course:  Septic shock with multiorgan  dysfunction- likely due to ?cholangitis/HCAP on a background of metastatic cancer, malnutrition and progressive weakness and decline as well as other problems as below. Patient with rapid decompensation and multiple organ failure, after discussions with her husband by PCCM, Dr. Benay Spice and myself and based on previous wishes of the patient have focused care towards comfort. Patient passed on 12-30-2013 at ~9:45 am.  Abdominal pain, nausea and vomiting in the setting of possible small bowel obstruction Acute renal failure Acute liver failure Cholangitis  Acute respiratory failure due to probable HCAP Hypothyroidism  Colon cancer metastasized to liver  Severe protein calorie malnutrition Hypokalemia  Anemia of chronic disease  Leukocytosis   Procedures:  none   Consultations:  Oncology  PCCM  IR    The results of significant diagnostics from this hospitalization (including imaging, microbiology, ancillary and laboratory) are listed below for reference.    Significant Diagnostic Studies: Ct Abdomen Pelvis W Contrast  11/20/2013   CLINICAL DATA Abdominal pain  EXAM CT ABDOMEN AND PELVIS WITH CONTRAST  TECHNIQUE Multidetector CT imaging of the abdomen and pelvis was performed using the standard protocol following bolus administration of intravenous contrast.  CONTRAST 36mL OMNIPAQUE IOHEXOL 300 MG/ML  SOLN  COMPARISON 08/12/2013 PET-CT, 03/11/2013 abdominal CT  FINDINGS Normal heart size. Lung bases predominantly clear. Partially imaged catheter tip near the cavoatrial junction. Coronary artery calcifications.  Left percutaneous transhepatic biliary drainage catheter and an internalized biliary drain remain in place. Large hypovascular mass within the liver centrally measuring 5.8 x 4.5 cm. There is mild biliary ductal dilatation peripheral to the mass so considerably improved  as compared to July 2014 though slightly increased from December. There are couple sub cm lesions within the left  hepatic lobe, not confidently seen in July as index series 2, image 24 and 32.  No appreciable abnormality of spleen, pancreas, adrenal glands. Mild heterogeneous enhancement of the right kidney (series 7, image 10 as index). Symmetric excretion.  Decompressed colon limits evaluation. Partial colonic resection with suture material in the right upper quadrant. No overt colitis. The jejunal and ileal loops are decompressed. There is marked distention of the stomach and proximal duodenum with transition along the horizontal segment.  Aorta is of normal caliber with mild scattered atherosclerotic disease. There may be paraesophageal varices. Question gastrohepatic adenopathy up to 1.7 cm.  Thin walled bladder. Pelvic floor laxity. Prominent pelvic collateral vessels. Probable exophytic fibroid. No definite adnexal mass.  Unchanged osseous deformity of the pelvis and curvature of the spine.  IMPRESSION Marked gastric and proximal duodenal distention to the level of the horizontal segment. Correlate clinically if concerned for obstruction.  Central hepatic mass with mild residual intrahepatic biliary ductal dilatation, similar to mildly increased from December (though markedly improved from July).  A couple sub cm left hepatic lobe lesions are suspicious for metastatic disease and not definitively seen on the prior. Prominent gastrohepatic lymph node is also suspicious for malignancy.  Mild heterogeneous enhancement of the right kidney on the delayed phase is nonspecific and may reflect sequelae of prior treatment/radiation. Ascending infection not excluded. Correlate with urinalysis.  SIGNATURE  Electronically Signed   By: Carlos Levering M.D.   On: 11/20/2013 03:22   Dg Chest Port 1 View  12/14/2013   CLINICAL DATA:  Airspace disease.  EXAM: PORTABLE CHEST - 1 VIEW  COMPARISON:  DG CHEST 1V PORT dated 12/18/2013; DG ABD ACUTE W/CHEST dated Jan 11, 2014  FINDINGS: Power port noted in stable position. Heart size and  pulmonary vascularity stable. Density in the left mid lung field is unchanged. This is consistent with atelectasis although focal pneumonia or mass cannot be excluded. Increased density is noted throughout the right lung. This may be related to poor inspiration however developing infiltrate or pleural effusion cannot be excluded. No pneumothorax.  IMPRESSION: 1. PowerPort stable position . 2. Density left mid lung field unchanged. Although this is most likely atelectasis, focal infiltrate or mass cannot be excluded. 3. Low lung volumes with developing increased density diffusely over the right lung. This could be secondary to atelectasis however infiltrate and/or effusion cannot be excluded.   Electronically Signed   By: Marcello Moores  Register   On: 01/01/2014 07:25   Dg Chest Port 1 View  12/18/2013   CLINICAL DATA:  Colon cancer.  EXAM: PORTABLE CHEST - 1 VIEW  COMPARISON:  DG ABD ACUTE W/CHEST dated 11-Jan-2014; NM PET TUM IMG RESTAG (PS) SKULL BASE T - THIGH dated 08/12/2013  FINDINGS: Power port noted with tip projected over cavoatrial junction. Mediastinum and hilar structures are unremarkable. Mild cardiomegaly and pulmonary venous congestion. Density over left mid lung field again noted. This is less obvious on today's exam. No pleural effusion or pneumothorax. Severe thoracic spine scoliosis .  IMPRESSION: 1. Power port noted with tip in stable position. 2. Mild cardiomegaly and pulmonary venous congestion. Low lung volumes. 3. Previously identified density left mid lung field remains but had improved. This suggests improving atelectasis.   Electronically Signed   By: Marcello Moores  Register   On: 12/18/2013 08:01   Dg Abd Acute W/chest  01-11-14   CLINICAL DATA:  Abdominal  pain, nausea and vomiting.  EXAM: ACUTE ABDOMEN SERIES (ABDOMEN 2 VIEW & CHEST 1 VIEW)  COMPARISON:  Chest radiograph performed 05/16/2011, and CT of the abdomen and pelvis performed 11/20/2013  FINDINGS: The lungs are hypoexpanded. Hazy  right-sided airspace opacity is noted. A new 3.4 cm masslike density is noted at the left midlung zone. Vascular congestion is noted. No definite pleural effusion or pneumothorax is seen. The cardiomediastinal silhouette is within normal limits. A left-sided chest port is noted ending about the cavoatrial junction.  The bowel gas pattern is difficult to assess due to limitations in positioning. Decubitus views demonstrate apparent air-fluid levels within small and large bowel loops, raising concern for either ileus or obstruction. A drainage catheter and stent are noted at the right abdomen. No free intra-abdominal air is identified on the provided decubitus view.  Left convex thoracic and right convex lumbar scoliosis is noted. No acute osseous abnormalities are seen.  IMPRESSION: 1. Apparent air-fluid levels within small and large bowel loops, raising concern for either mild ileus or some degree of small bowel obstruction. No free intra-abdominal air seen. 2. Hazy right-sided airspace opacity noted; this could reflect mild pneumonia. 3. New 3.4 cm masslike density at the left midlung zone. Malignancy cannot be excluded; this could reflect metastatic disease, given the patient's history of metastatic colon cancer.   Electronically Signed   By: Garald Balding M.D.   On: 12/29/2013 05:52   Dg Abd Portable 2v  12/18/2013   CLINICAL DATA:  Ileus versus bowel obstruction, followup  EXAM: PORTABLE ABDOMEN - 2 VIEW  COMPARISON:  Abdomen films of 12/12/2013  FINDINGS: Portable views of the abdomen including supine and left lateral decubitus show air-fluid levels probably in the right colon. However no definite bowel obstruction is seen. No free air is noted on the decubitus image. A percutaneous catheter overlies the right abdomen. Probable calcified uterine fibroid is noted in the mid pelvis. The bones are osteopenic with lumbar scoliosis noted.  IMPRESSION: No definite bowel obstruction. Air-fluid level primarily in  the colon. No free air.   Electronically Signed   By: Ivar Drape M.D.   On: 12/18/2013 08:02   Dg Ugi W/high Density W/kub  11/22/2013   CLINICAL DATA:  Proximal duodenal stricture. Evaluate anatomy prior to possible duodenal stenting.  EXAM: UPPER GI SERIES W/HIGH DENSITY W/KUB  TECHNIQUE: After obtaining a scout radiograph a routine upper GI series was performed using thin density barium.  FLUOROSCOPY TIME:  3 min 6 seconds  COMPARISON:  Abdominal CT 11/20/2013  FINDINGS: Scout image shows a metallic biliary stent in unchanged position. There is an internalized biliary catheter from the left, also unchanged. The bowel gas pattern is nonspecific, but non obstructive. The moderately gas-filled stomach is deviated to the left secondary to scoliosis (per previous CT).  Limited fluoroscopic imaging was performed due to the clinical circumstances, specific clinical question, and recent endoscopy. The elongated appearing stomach contained minimal retained fluid. The gastric folds appeared thickened, but this was likely a function of incomplete distention. With gravity, contrast readily flowed through the duodenum, which is upwardly retracted and distorted in an area of fixed narrowing with irregular mucosal surface. The area of narrowing is approximately 4 cm in length and the minimal luminal diameter is roughly 9 mm. Narrowing is centered at the level of the ampulla, as localized by the biliary stent. There is distension of the second and third portions of the duodenum secondary to impression by the SMA, as confirmed on  preceding abdominal CT. Contrast would flow through this segment in a positionaly dependent manner.  IMPRESSION: Extrinsic, malignant-appearing proximal duodenal stricture - described above. The stricture is currently nonobstructive.   Electronically Signed   By: Jorje Guild M.D.   On: 11/22/2013 13:55   Ir Patient Eval Tech 0-60 Mins  12/09/2013   D Rowe Robert, PA-C     12/09/2013  3:02 PM  Pt presented to IR dept today for evaluation of biliary drain  secondary to air in bag and broken suture. On exam, drain is  intact, insertion site is ok, suture is wrapped around drain but  not in skin. Drain was irrigated with sterile NS without  difficulty. Air and bile present in bag and not unusual to see.  Will plan to place pursestring suture at drain insertion site.  Case d/w Dr. Kathlene Cote and no further imaging necessary.Pt will  return to IR dept on 4/17 for routine drain exchange.    Microbiology: Recent Results (from the past 240 hour(s))  MRSA PCR SCREENING     Status: Abnormal   Collection Time    2014-01-12  9:35 AM      Result Value Ref Range Status   MRSA by PCR POSITIVE (*) NEGATIVE Final   Comment:            The GeneXpert MRSA Assay (FDA     approved for NASAL specimens     only), is one component of a     comprehensive MRSA colonization     surveillance program. It is not     intended to diagnose MRSA     infection nor to guide or     monitor treatment for     MRSA infections.     RESULT CALLED TO, READ BACK BY AND VERIFIED WITH:     J CONRAD RN [id number] 01-12-14 A NAVARRO     Labs: Basic Metabolic Panel:  Recent Labs Lab Jan 12, 2014 0540 01-12-14 1135 12/17/13 0615 12/18/13 0405 12/12/2013 0500  NA 140 145 139 143 145  K 3.1* 3.9 4.2 4.2 4.3  CL 91* 94* 96 99 97  CO2 30 33* 31 24 19   GLUCOSE 144* 130* 182* 103* 63*  BUN 13 15 33* 50* 61*  CREATININE 0.31* 0.36* 1.00 1.80* 2.68*  CALCIUM 10.2 9.7 8.8 6.8* 6.7*  MG  --  1.8  --   --   --   PHOS  --  4.6  --   --   --    Liver Function Tests:  Recent Labs Lab 01/12/14 0540 01-12-2014 1135 12/17/13 0615 12/18/13 0405 12/25/2013 0500  AST 84* 230* 2451* 2270* 4018*  ALT 31 66* 602* 752* 1171*  ALKPHOS 277* 258* 287* 346* 302*  BILITOT 5.9* 5.7* 4.9* 4.8* 6.0*  PROT 9.1* 8.9* 7.4 6.0 5.8*  ALBUMIN 2.2* 2.2* 1.8* 1.5* 1.5*    Recent Labs Lab 01-12-2014 0540  LIPASE 45    Recent Labs Lab 12/18/13 0405   AMMONIA 209*   CBC:  Recent Labs Lab 12-Jan-2014 0540 2014-01-12 1135 12/17/13 0615 12/18/13 0405 01/08/2014 0500  WBC 19.6* 18.9* 19.6* 19.1* 24.5*  NEUTROABS  --  16.5*  --   --   --   HGB 10.2* 10.1* 9.5* 8.0* 7.5*  HCT 30.8* 30.4* 29.3* 25.7* 22.4*  MCV 87.3 86.9 89.1 90.8 88.2  PLT 393 413* 397 321 405*   CBG:  Recent Labs Lab 12/17/13 1615 12/17/13 1952 12/17/13 2305 12/18/13 0827 12/18/13 1204  GLUCAP 160*  153* 130* 86 113*       Signed:  [facility] 2014-01-11, 10:19 AM

## 2014-01-09 NOTE — Progress Notes (Signed)
ANTIBIOTIC CONSULT NOTE - Follow-up  Pharmacy Consult for Vancomycin, Zosyn Indication: cholangitis, PNA  Allergies  Allergen Reactions  . Adhesive [Tape]     Pt is fine with paper tape  . Codeine Nausea And Vomiting    Pt denies this    Patient Measurements: Height: 5' (152.4 cm) Weight: 125 lb 10.6 oz (57 kg) IBW/kg (Calculated) : 45.5  Vital Signs: Temp: 97.7 F (36.5 C) (04/10 0400) Temp src: Axillary (04/10 0400) BP: 77/34 mmHg (04/10 0800) Pulse Rate: 123 (04/10 0800) Intake/Output from previous day: 04/09 0701 - 04/10 0700 In: 1575.6 [I.V.:1325.6; IV Piggyback:250] Out: 1025 [Drains:1025] Intake/Output from this shift: Total I/O In: 49.9 [I.V.:49.9] Out: -   Labs:  Recent Labs  12/17/13 0615 12/18/13 0405 12/25/2013 0500  WBC 19.6* 19.1* 24.5*  HGB 9.5* 8.0* 7.5*  PLT 397 321 405*  CREATININE 1.00 1.80* 2.68*   Estimated Creatinine Clearance: 17.9 ml/min (by C-G formula based on Cr of 2.68). No results found for this basename: VANCOTROUGH, Corlis Leak, VANCORANDOM, Sunbright, GENTPEAK, GENTRANDOM, TOBRATROUGH, TOBRAPEAK, TOBRARND, AMIKACINPEAK, AMIKACINTROU, AMIKACIN,  in the last 72 hours   Microbiology: Recent Results (from the past 720 hour(s))  MRSA PCR SCREENING     Status: Abnormal   Collection Time    01/07/2014  9:35 AM      Result Value Ref Range Status   MRSA by PCR POSITIVE (*) NEGATIVE Final   Comment:            The GeneXpert MRSA Assay (FDA     approved for NASAL specimens     only), is one component of a     comprehensive MRSA colonization     surveillance program. It is not     intended to diagnose MRSA     infection nor to guide or     monitor treatment for     MRSA infections.     RESULT CALLED TO, READ BACK BY AND VERIFIED WITH:     Lady Deutscher RN [id number] 01/01/2014 A NAVARRO    Medical History: Past Medical History  Diagnosis Date  . colon ca dx'd 04/2009    chemo comp 11/2009.. colon and liver  . Spina bifida   . Scoliosis   .  Hyperlipidemia   . Foot drop     bilateral,wears braces  . Mitral valve prolapse   . Hypothyroidism   . Depression   . History of chemotherapy     5-FU and Avastin 12 cycles FOLFOX  . Colon cancer 05/06/2010    metastatic  . Hepatic metastases 08/15/12    per ct abdomen  . Radiation 10/21/12-10/28/12    Palliative liver mets 54 gray in 3 fx's  . Arrhythmia 02-26-13    "rapid heart rate occ."  . History of radiation therapy 10/21/2012-10/28/2012    52 gray to liver metastasis  . History of radiation therapy 04/07/2013-04/24/2013    45 gray to liver metastasis  . 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  She was recently admitted last month for similar complaints.  Pharmacy consulted to dose Vancomycin and Zosyn for HCAP coverage and cholangitis.  Surgery, IR, and Oncology following patient.  Tmax: AF WBC 24.5 increasing Renal: SCr 2.68, increased significantly again today. (1.8 on 4/9, 1.0 on 4/8,. 0.36 on 4/7) CrCl ~29ml/min (CG, TBW = 57 kg)  Goal of Therapy:  Vancomycin  trough level 15-20 mcg/ml Doses adjusted per renal function  Plan:  1.  Adjust Vancomycin dosing to Vancomycin 750mg  IV q48h due to worsening renal function. 2.  Adjust Zosyn to 2.25g IV q6h 3.  F/u SCr, obtain vancomycin levels at steady state, culture results, clinical course.  Julio Sicks, PharmD 01/05/2014,8:31 AM

## 2014-01-09 NOTE — Progress Notes (Deleted)
PROGRESS NOTE  April Spence QQI:[id number] DOB: [birth year] DOA: 12-31-2013 PCP: Alesia Richards, MD  Assessment/Plan: Septic shock - likely due to ?cholangitis/HCAP on a background of metastatic cancer, malnutrition and progressive weakness and decline; persistent hypotension overnight, added Levophed this morning. PCCM following.  - patient looks that she is actively dying and after discussions with her husband by PCCM, Dr. Benay Spice and myself, will not add more pressor support, patient is a no code, no CPR, no intubation.    - overnight patient in more discomfort, morphine gtt started - long discussion with family this morning, will discontinue vasopressors, transition care to comfort.     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  Cholangitis  Acute respiratory failure due to probable HCAP - CXR worsening this morning.  Hypothyroidism - Continue synthroid  Colon cancer metastasized to liver Severe protein calorie malnutrition Secondary to history of progressive malignancy. .  Hypokalemia Anemia of chronic disease Secondary to history of malignancy  Leukocytosis Secondary to possible pneumonia  Progressive decline in the past few months with a rapid decline in the hospital in the past 2 days. Very guarded prognosis.   Diet: clears Fluids: NS 75 cc/h DVT Prophylaxis: SCD  Code Status: Full Family Communication: husband bedside  Disposition Plan: inpatient  Consultants:  Oncology  Surgery  IR  Procedures:  none   Antibiotics Levofloxacin 4/7 >> 4/8 Vancomycin 4/8 >> Zosyn 4/8 >>  HPI/Subjective: - non responsive, moaning  intermittently  Objective: Filed Vitals:   12/23/2013 0000 01/07/2014 0100 12/15/2013 0300 12/18/2013 0400  BP: 89/29 107/27 86/44 80/33   Pulse: 120 130 116 115  Temp: 99.8 F (37.7 C)   97.7 F (36.5 C)  TempSrc: Axillary   Axillary  Resp: 24 33 20 24  Height:      Weight:      SpO2: 98% 96% 96% 98%    Intake/Output Summary (Last 24 hours) at 01/06/2014 0641 Last data filed at 12/11/2013 0600  Gross per 24 hour  Intake 1526.67 ml  Output   1225 ml  Net 301.67 ml   Filed Weights   12/31/2013 1000 12/17/13 1211  Weight: 53.887 kg (118 lb 12.8 oz) 57 kg (125 lb 10.6 oz)   Exam:  General:  More lethargic today, confused  Cardiovascular: regular rate and rhythm, without MRG  Respiratory: good air movement, clear to auscultation throughout, no wheezing, ronchi or rales  Abdomen: soft, mild tenderness RUQ  MSK: trace peripheral edema  Neuro: non focal  Data Reviewed: Basic Metabolic Panel:  Recent Labs Lab 12-31-13 0540 12-31-13 1135 12/17/13 0615 12/18/13 0405 12/29/2013 0500  NA 140 145 139 143 145  K 3.1* 3.9 4.2 4.2 4.3  CL 91* 94* 96 99 97  CO2 30 33* 31 24 19   GLUCOSE 144* 130* 182* 103* 63*  BUN 13 15 33* 50* 61*  CREATININE 0.31* 0.36* 1.00 1.80* 2.68*  CALCIUM 10.2 9.7 8.8 6.8* 6.7*  MG  --  1.8  --   --   --   PHOS  --  4.6  --   --   --    Liver Function Tests:  Recent Labs Lab 31-Dec-2013 0540 12-31-13 1135 12/17/13 0615 12/18/13  0405 12/25/2013 0500  AST 84* 230* 2451* 2270* 4018*  ALT 31 66* 602* 752* 1171*  ALKPHOS 277* 258* 287* 346* 302*  BILITOT 5.9* 5.7* 4.9* 4.8* 6.0*  PROT 9.1* 8.9* 7.4 6.0 5.8*  ALBUMIN 2.2* 2.2* 1.8* 1.5* 1.5*    Recent Labs Lab 12/22/2013 0540  LIPASE 45   CBC:  Recent Labs Lab 12-22-13 0540 2013-12-22 1135 12/17/13 0615 12/18/13 0405 12/25/2013 0500  WBC 19.6* 18.9* 19.6* 19.1* 24.5*  NEUTROABS  --  16.5*  --   --   --   HGB 10.2* 10.1* 9.5* 8.0* 7.5*  HCT 30.8* 30.4* 29.3* 25.7* 22.4*  MCV 87.3 86.9 89.1 90.8  88.2  PLT 393 413* 397 321 405*   CBG:  Recent Labs Lab 12/17/13 1615 12/17/13 1952 12/17/13 2305 12/18/13 0827 12/18/13 1204  GLUCAP 160* 153* 130* 86 113*    Recent Results (from the past 240 hour(s))  MRSA PCR SCREENING     Status: Abnormal   Collection Time    12-22-13  9:35 AM      Result Value Ref Range Status   MRSA by PCR POSITIVE (*) NEGATIVE Final   Comment:            The GeneXpert MRSA Assay (FDA     approved for NASAL specimens     only), is one component of a     comprehensive MRSA colonization     surveillance program. It is not     intended to diagnose MRSA     infection nor to guide or     monitor treatment for     MRSA infections.     RESULT CALLED TO, READ BACK BY AND VERIFIED WITH:     Lady Deutscher RN [id number] 12/22/2013 A NAVARRO     Studies: Dg Chest Port 1 View  12/18/2013   CLINICAL DATA:  Colon cancer.  EXAM: PORTABLE CHEST - 1 VIEW  COMPARISON:  DG ABD ACUTE W/CHEST dated 12-22-2013; NM PET TUM IMG RESTAG (PS) SKULL BASE T - THIGH dated 08/12/2013  FINDINGS: Power port noted with tip projected over cavoatrial junction. Mediastinum and hilar structures are unremarkable. Mild cardiomegaly and pulmonary venous congestion. Density over left mid lung field again noted. This is less obvious on today's exam. No pleural effusion or pneumothorax. Severe thoracic spine scoliosis .  IMPRESSION: 1. Power port noted with tip in stable position. 2. Mild cardiomegaly and pulmonary venous congestion. Low lung volumes. 3. Previously identified density left mid lung field remains but had improved. This suggests improving atelectasis.   Electronically Signed   By: Marcello Moores  Register   On: 12/18/2013 08:01   Dg Abd Portable 2v  12/18/2013   CLINICAL DATA:  Ileus versus bowel obstruction, followup  EXAM: PORTABLE ABDOMEN - 2 VIEW  COMPARISON:  Abdomen films of December 22, 2013  FINDINGS: Portable views of the abdomen including supine and left lateral decubitus show air-fluid levels probably in  the right colon. However no definite bowel obstruction is seen. No free air is noted on the decubitus image. A percutaneous catheter overlies the right abdomen. Probable calcified uterine fibroid is noted in the mid pelvis. The bones are osteopenic with lumbar scoliosis noted.  IMPRESSION: No definite bowel obstruction. Air-fluid level primarily in the colon. No free air.   Electronically Signed   By: Ivar Drape M.D.   On: 12/18/2013 08:02    Scheduled Meds: . chlorhexidine  15 mL Mouth/Throat BID  . docusate sodium  100 mg Oral BID  .  levothyroxine  150 mcg Oral QAC breakfast  . piperacillin-tazobactam (ZOSYN)  IV  3.375 g Intravenous Q8H  . vancomycin  500 mg Intravenous Q24H   Continuous Infusions: . sodium chloride 100 mL/hr at 12/18/13 0941  . morphine 2 mg/hr (01-17-14 0600)  . norepinephrine (LEVOPHED) Adult infusion 50 mcg/min (01/17/2014 0600)    Active Problems:   Hypothyroidism   Hypertension   Gastric outlet obstruction   Colon cancer metastasized to liver   Anemia of chronic disease   Protein-calorie malnutrition, severe   Vomiting   Respiratory failure   HCAP (healthcare-associated pneumonia)  Time spent: 35  This note has been created with Surveyor, quantity. Any transcriptional errors are unintentional.   Marzetta Board, MD [facility] Pager [phone]. If 7 PM - 7 AM, please contact night-coverage at [email or web address], password [facility] 17-Jan-2014, 6:41 AM  LOS: 3 days

## 2014-01-09 NOTE — Progress Notes (Signed)
Post mortem care done.  Unable to remove biliary drain.  New drsg applied.  Waiting for funeral home to pick patient up frm [phone].

## 2014-01-09 DEATH — deceased

## 2014-05-05 ENCOUNTER — Other Ambulatory Visit: Payer: Self-pay | Admitting: Pharmacist

## 2014-05-27 ENCOUNTER — Encounter: Payer: Self-pay | Admitting: Internal Medicine

## 2014-09-09 IMAGING — CT CT ABD-PELV W/ CM
2 of 5 series · 16 of 46 positions shown, 18 images · IV contrast (Omnipaque 300)
Comparison: ERCP of 02/27/2013.  MRI of 02/21/2013.  Most recent CT
of 08/15/2012.

CLINICAL DATA: Increasing bilirubin.  Nausea.  History of colon
cancer with metastases to liver.  Radiation therapy chemotherapy.
Recent biliary stent placement.

CT ABDOMEN AND PELVIS WITH CONTRAST
TECHNIQUE: Multidetector CT imaging of the abdomen and pelvis was
performed following the standard protocol during bolus
administration of intravenous contrast.
Contrast: 100mL OMNIPAQUE IOHEXOL 300 MG/ML  SOLN

[Series 2: abd/ pel 5mm · axial · 0.70mm/px · z∈[-350,-26]mm · 13 of 75 slices shown, 15 images]
[im 5/75  soft-tissue]
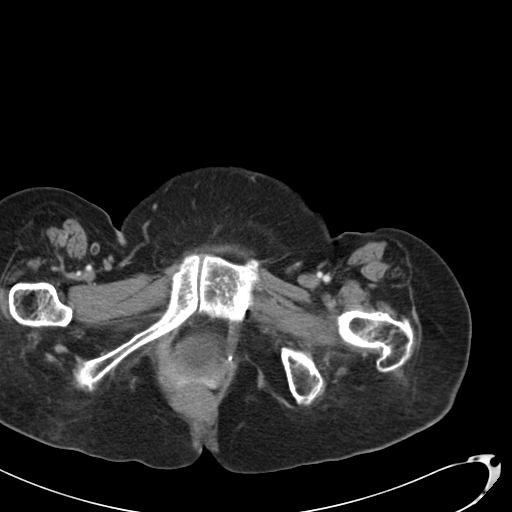
[im 5/75  bone]
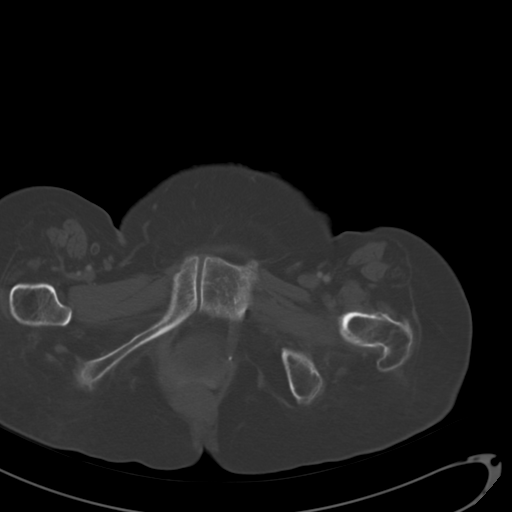
[im 9/75  soft-tissue]
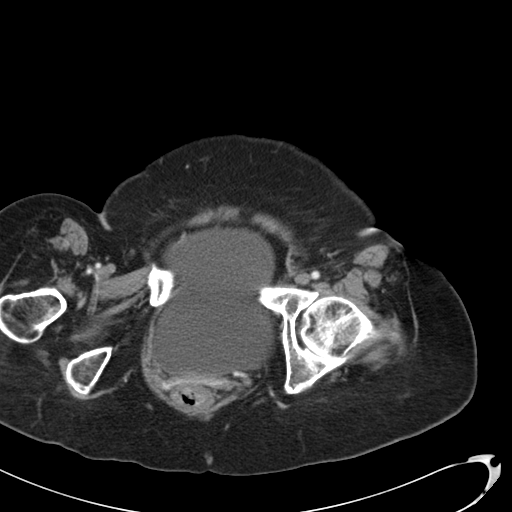
[im 18/75  soft-tissue]
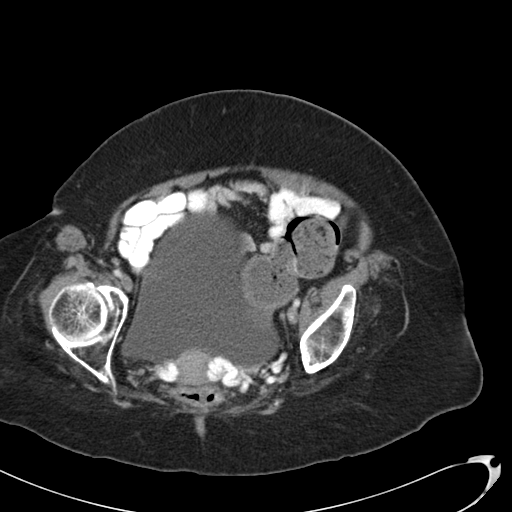
[im 22/75  soft-tissue]
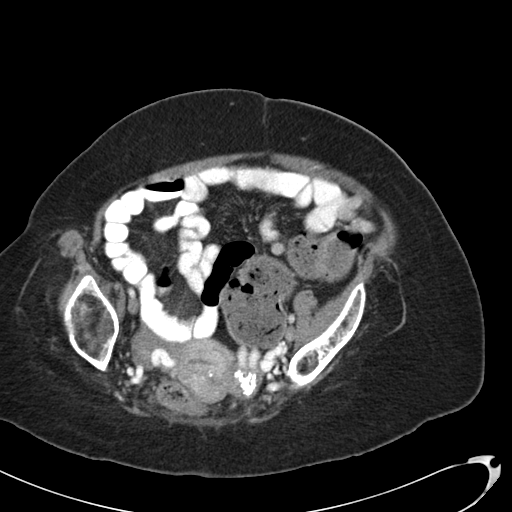
[im 27/75  soft-tissue]
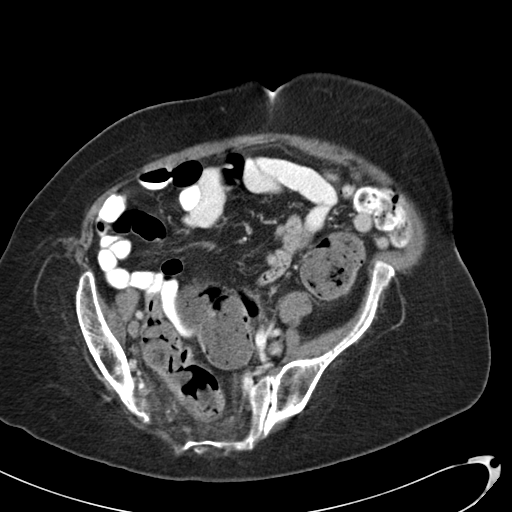
[im 31/75  soft-tissue]
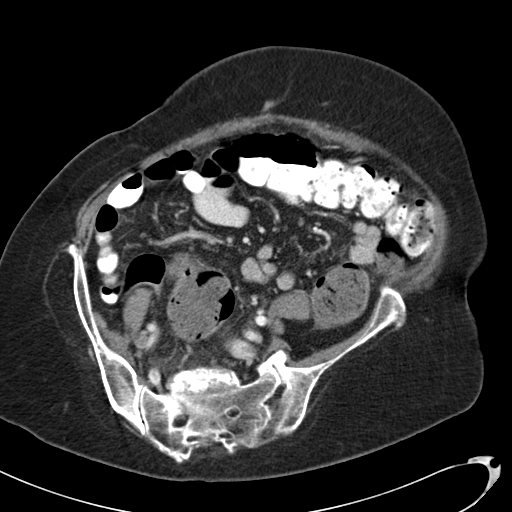
[im 40/75  soft-tissue]
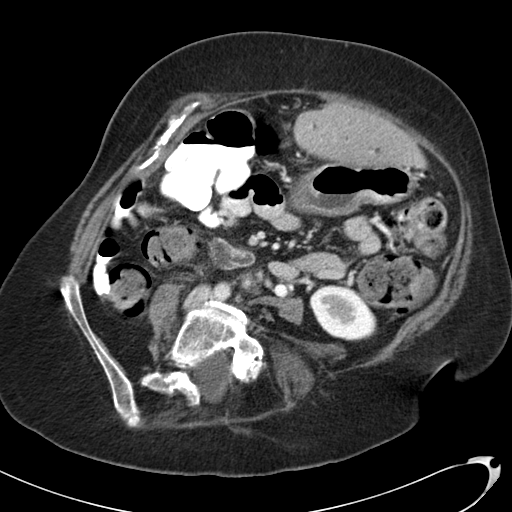
[im 44/75  soft-tissue]
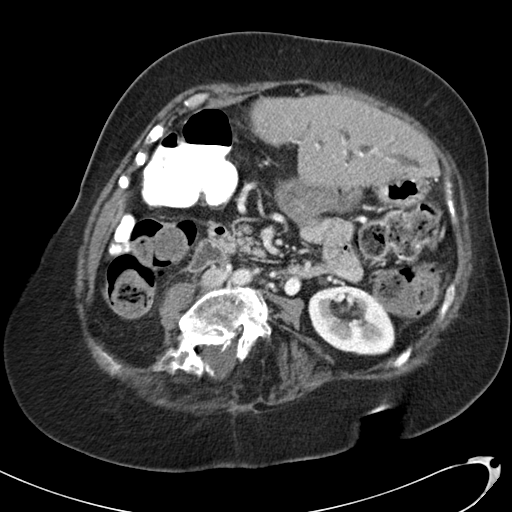
[im 48/75  soft-tissue]
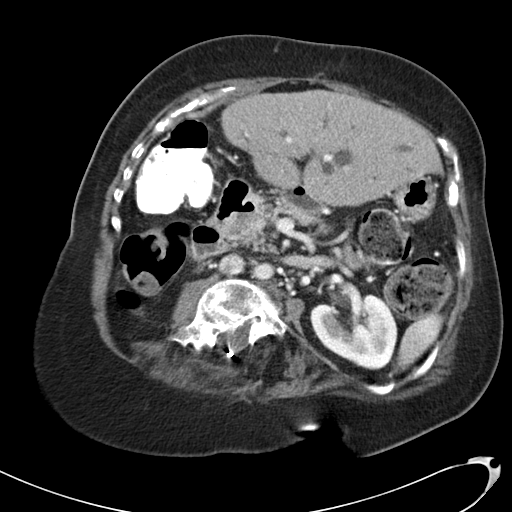
[im 48/75  bone]
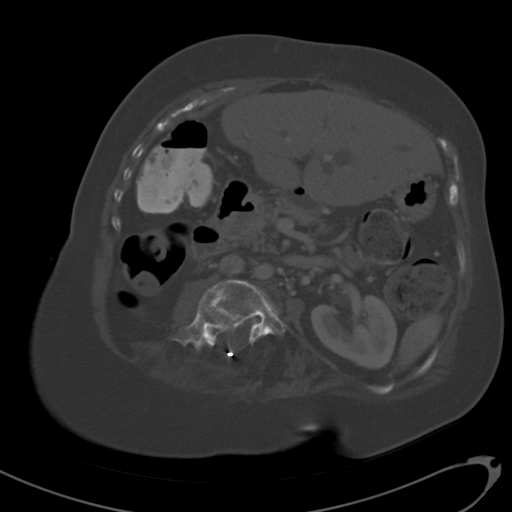
[im 53/75  soft-tissue]
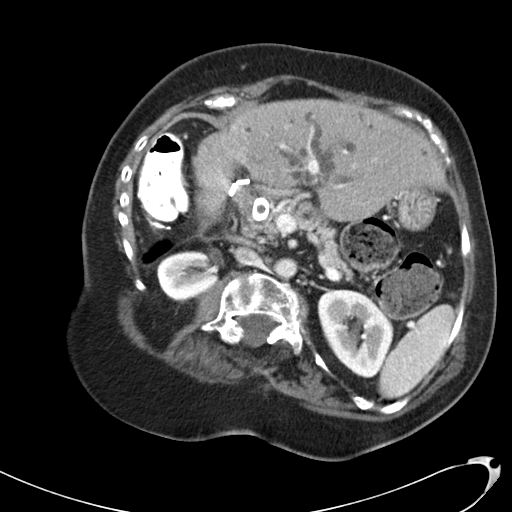
[im 57/75  soft-tissue]
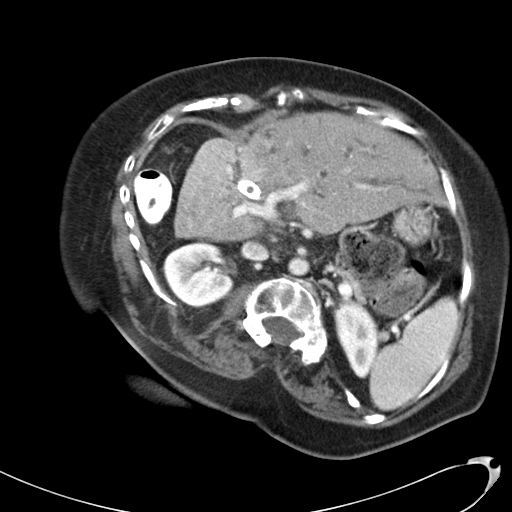
[im 66/75  soft-tissue]
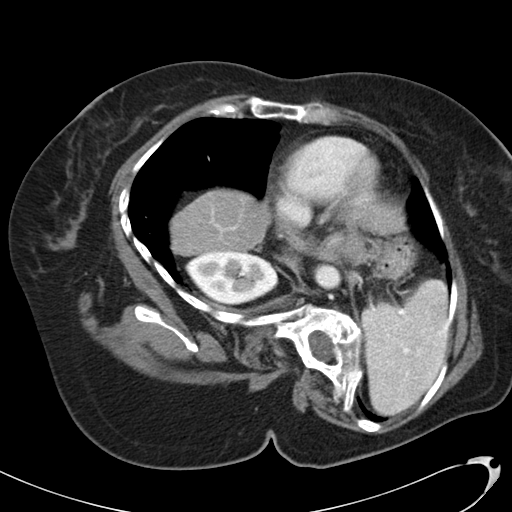
[im 70/75  soft-tissue]
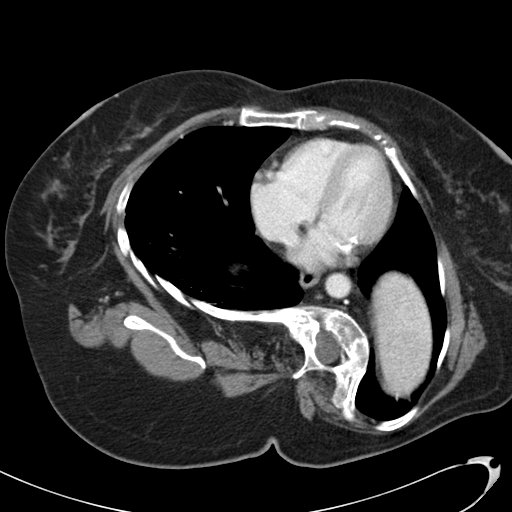

[Series 602: cor · coronal · 0.76mm/px · 3 of 128 slices shown]
[im 43/128  soft-tissue]
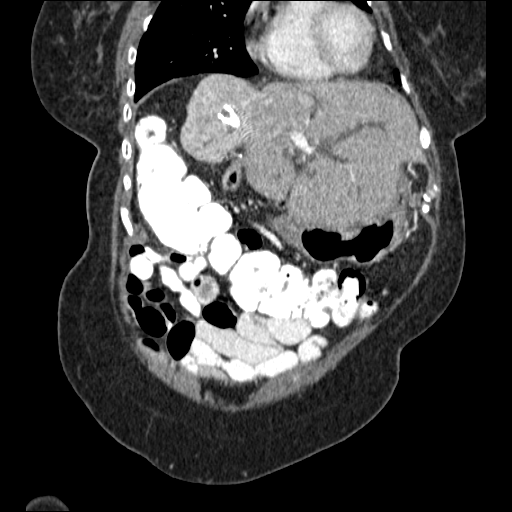
[im 57/128  soft-tissue]
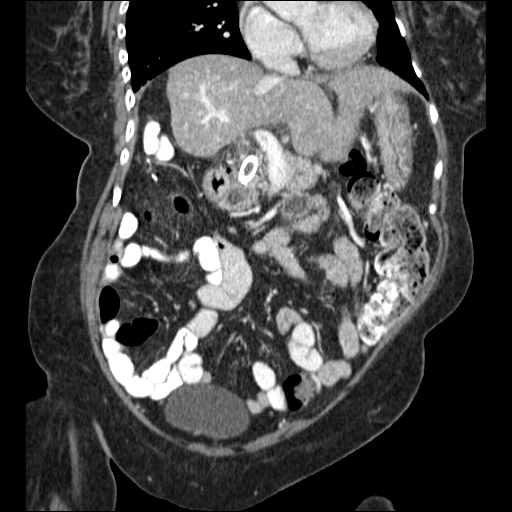
[im 71/128  soft-tissue]
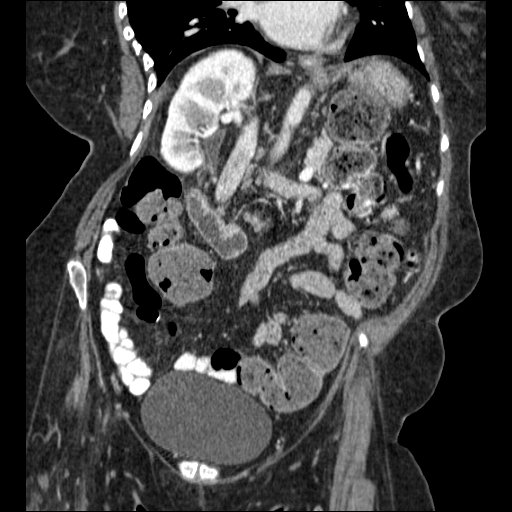

[16 of 46 positions shown; findings below may reference images not displayed]

FINDINGS: Lung bases:  Normal

Abdomen/pelvis:  The right liver lobe lesion described on
02/21/2013 is improved and partially obscured by the tip of the
biliary stent.  Likely measures on the order of 2.2 cm on image
16/series 2.  2.6 cm of same level on the prior.  Mild cephalad
right-sided intrahepatic biliary ductal dilatation has resolved.

The moderate left intrahepatic biliary ductal dilatation is similar
to 02/21/2013.  For example, index intrahepatic duct measures 9 mm
on image 22/series 2 versus 1.0 cm at same level on the prior exam.
The common duct stent terminates proximally in the central right
hepatic duct.  The obstruction involving the central left hepatic
duct is separate, and measures 1.5 x 1.7 cm on image 20/series 2.
No new left-sided lesions are identified.

Small splenule.  Anatomic distortion secondary to extent of spinal
curvature.

Mild pancreatic atrophy.  The biliary stent terminates at the level
of the ampulla.

Portal veins patent.  Hepatic veins not well evaluated.

Cholecystectomy.  Normal adrenal glands.  Normal kidneys.

Retroaortic left renal vein.  No retroperitoneal adenopathy.

Large amount of colonic stool.  Surgical changes of right
hemicolectomy.

Normal small bowel without abdominal ascites.

  No evidence of omental or peritoneal disease.

No pelvic adenopathy.  Normal urinary bladder.  Probable exophytic
left sided dystrophic uterine fibroid.  This is unchanged. No
adnexal mass or significant free fluid.

Bones/Musculoskeletal:  Marked S-shaped thoracolumbar spine
curvature with a rotatory component.
IMPRESSION: 1.  Since 02/21/2013, placement of a biliary stent.  This has its
cephalad portion in the right hepatic ductal system, and does not
traverse the previously described central left hepatic lobe
stricture.  The stricture is similar in size and causes similar
moderate left sided intrahepatic ductal dilatation.
2.  Decreased size of a right hepatic lobe metastasis, partially
obscured by the tip of the biliary stent.  Ductal dilatation in
this area has resolved.
These results will be called to the ordering clinician or
representative by the Radiologist Assistant, and communication
documented in the PACS Dashboard.
3.  No evidence of extrahepatic metastatic disease.
4. Possible constipation.
5.  Distorted anatomy secondary to the extent of spinal curvature.

## 2015-05-13 IMAGING — XA IR CHOLANGIOGRAM VIA EXIST CATHETER
2 series · 7 of 7 positions shown · non-contrast
Comparison: none

CLINICAL DATA: Biliary obstruction, chronic indwelling internal
external biliary drain catheter

[Series 2: fl - angio · 4 of 43 frames shown]
[frame 7/43]
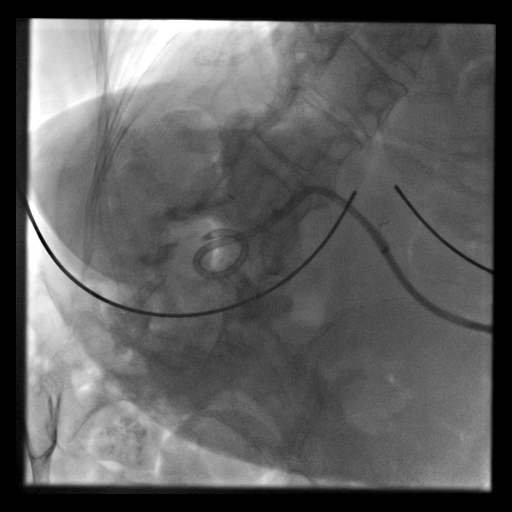
[frame 9/43]
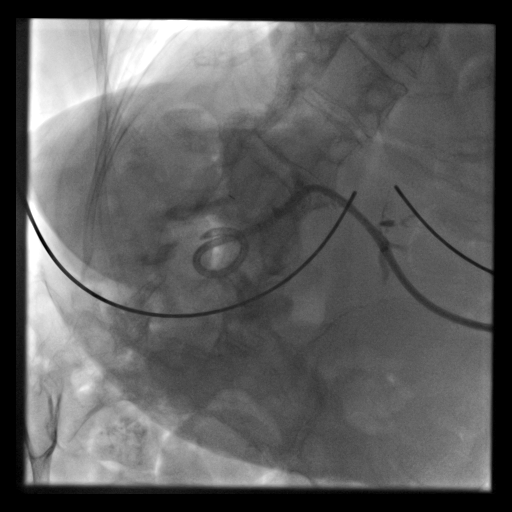
[frame 22/43]
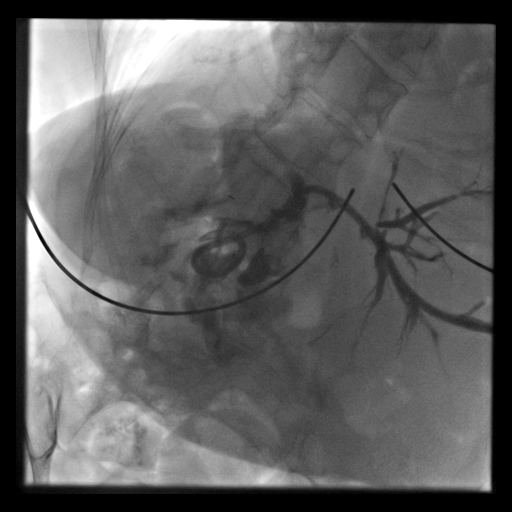
[frame 37/43]
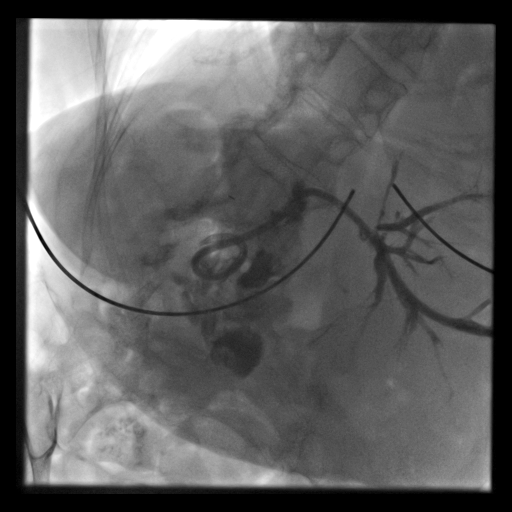

[Series 300: ir radiologist eval & mgmt · 3 of 3 slices shown]
[im 1/3]
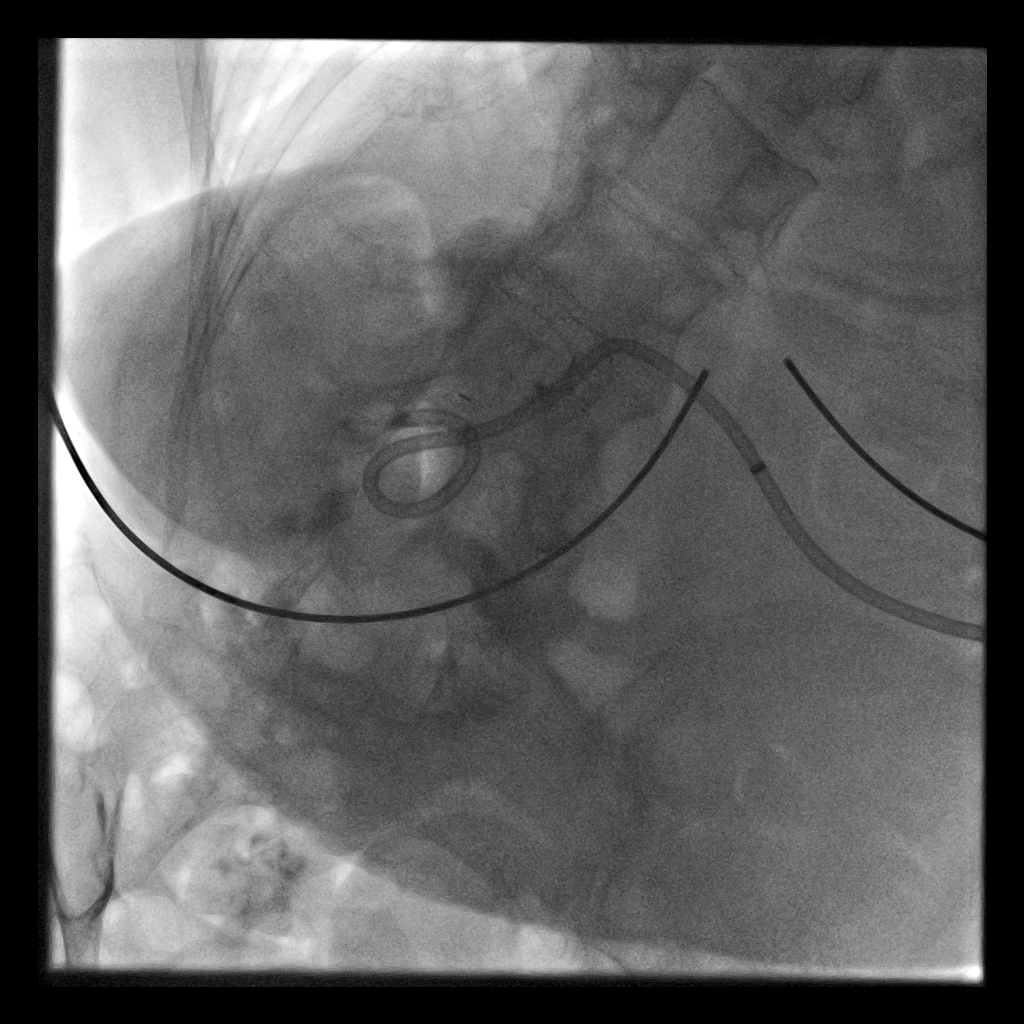
[im 2/3]
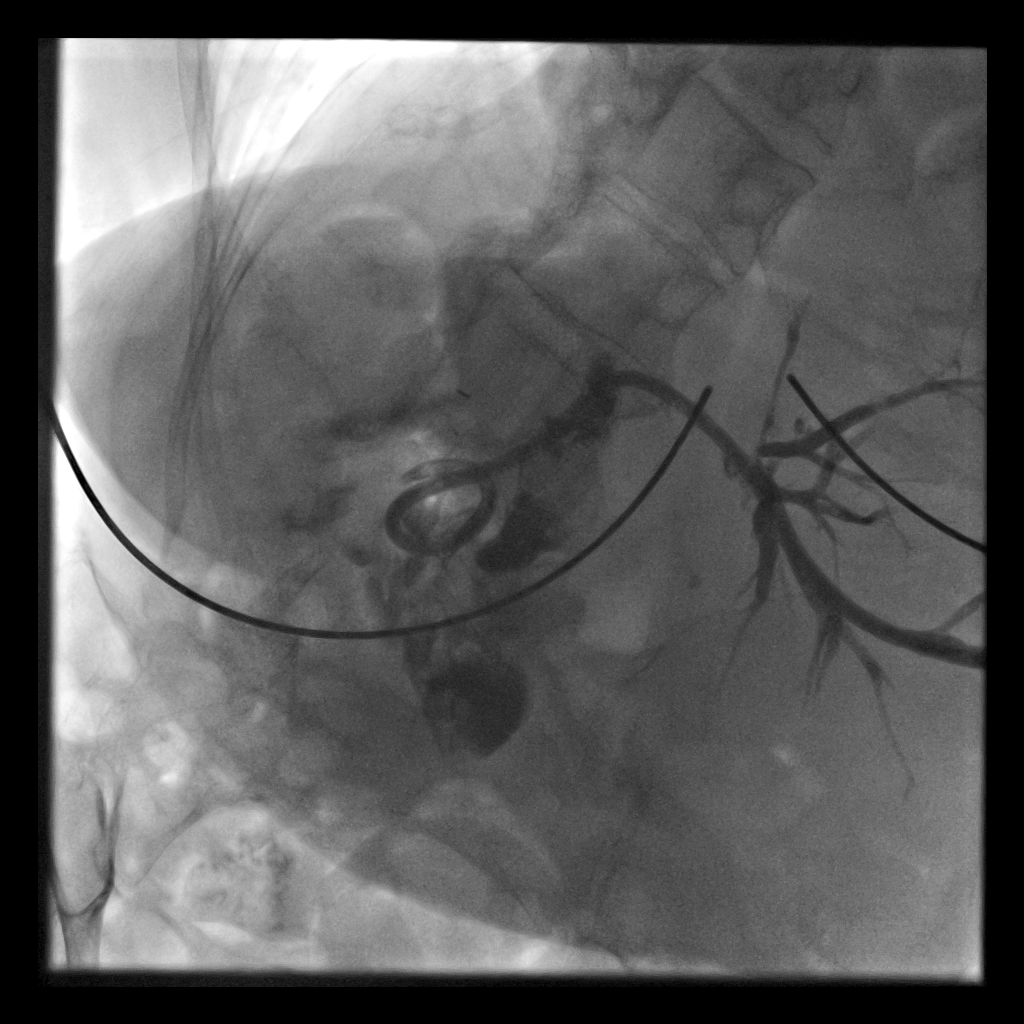
[im 3/3]
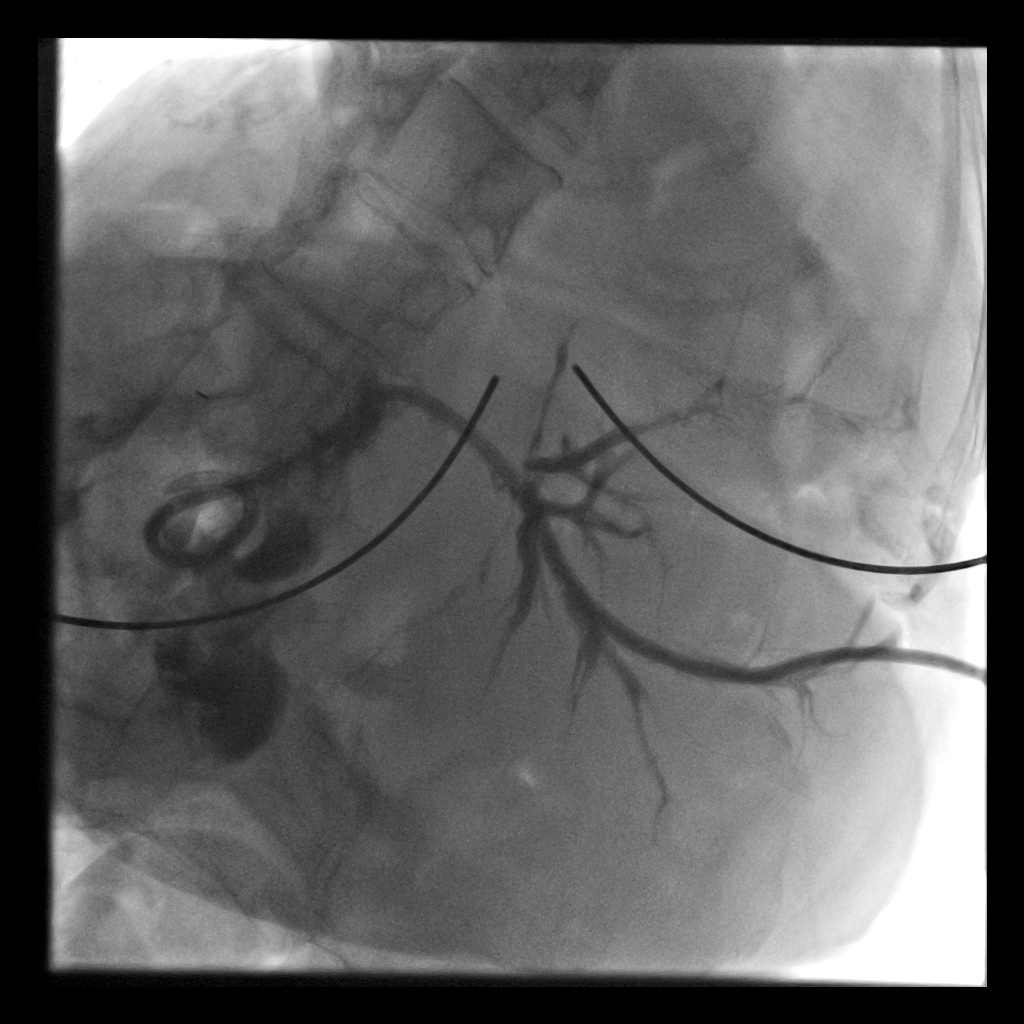

[7 of 7 positions shown; findings below may reference images not displayed]

EXAM:
CHOLANGIOGRAM THROUGH EXISTING CATHETER

Radiologist:  Siu, Juan Lopez

Guidance:  Fluoroscopic

MEDICATIONS AND MEDICAL HISTORY:
1% lidocaine locally

ANESTHESIA/SEDATION:
None.

CONTRAST:  5 cc Omnipaque 300

FLUOROSCOPY TIME:  6 seconds

PROCEDURE:
Informed consent was obtained from the patient following explanation
of the procedure, risks, benefits and alternatives. The patient
understands, agrees and consents for the procedure. All questions
were addressed. A time out was performed.

Under sterile conditions, the existing left 12 French internal
external biliary drain catheter was injected with contrast. Drain
catheter position is unchanged. Retention loop the duodenum. Biliary
system is decompressed. Tube is widely patent. No current need for
exchange. System was decompressed by syringe aspiration.

COMPLICATIONS:
No immediate
IMPRESSION: Adequately positioned left internal external biliary drain with
decompression of the biliary system. Tube is widely patent. No
further intervention warranted.
